# Patient Record
Sex: Female | Born: 1975 | Hispanic: No | Marital: Married | Smoking: Current every day smoker
Health system: Southern US, Community
[De-identification: ages and names within clinical notes are randomized; demographics above are authoritative.]

## PROBLEM LIST (undated history)

## (undated) DIAGNOSIS — R0602 Shortness of breath: Secondary | ICD-10-CM

## (undated) DIAGNOSIS — Z9221 Personal history of antineoplastic chemotherapy: Secondary | ICD-10-CM

## (undated) DIAGNOSIS — I10 Essential (primary) hypertension: Secondary | ICD-10-CM

## (undated) DIAGNOSIS — E1165 Type 2 diabetes mellitus with hyperglycemia: Secondary | ICD-10-CM

## (undated) DIAGNOSIS — R1900 Intra-abdominal and pelvic swelling, mass and lump, unspecified site: Secondary | ICD-10-CM

## (undated) DIAGNOSIS — J189 Pneumonia, unspecified organism: Secondary | ICD-10-CM

## (undated) DIAGNOSIS — G4730 Sleep apnea, unspecified: Secondary | ICD-10-CM

## (undated) DIAGNOSIS — F32A Depression, unspecified: Secondary | ICD-10-CM

## (undated) DIAGNOSIS — C50211 Malignant neoplasm of upper-inner quadrant of right female breast: Secondary | ICD-10-CM

## (undated) DIAGNOSIS — B977 Papillomavirus as the cause of diseases classified elsewhere: Secondary | ICD-10-CM

## (undated) DIAGNOSIS — F419 Anxiety disorder, unspecified: Secondary | ICD-10-CM

## (undated) DIAGNOSIS — C801 Malignant (primary) neoplasm, unspecified: Secondary | ICD-10-CM

## (undated) DIAGNOSIS — D649 Anemia, unspecified: Secondary | ICD-10-CM

## (undated) DIAGNOSIS — M549 Dorsalgia, unspecified: Secondary | ICD-10-CM

## (undated) DIAGNOSIS — Z171 Estrogen receptor negative status [ER-]: Secondary | ICD-10-CM

## (undated) DIAGNOSIS — M5430 Sciatica, unspecified side: Secondary | ICD-10-CM

## (undated) DIAGNOSIS — J45909 Unspecified asthma, uncomplicated: Secondary | ICD-10-CM

## (undated) DIAGNOSIS — Z8279 Family history of other congenital malformations, deformations and chromosomal abnormalities: Secondary | ICD-10-CM

## (undated) DIAGNOSIS — S060XAA Concussion with loss of consciousness status unknown, initial encounter: Secondary | ICD-10-CM

## (undated) DIAGNOSIS — F4310 Post-traumatic stress disorder, unspecified: Secondary | ICD-10-CM

## (undated) DIAGNOSIS — Z923 Personal history of irradiation: Secondary | ICD-10-CM

## (undated) DIAGNOSIS — Z803 Family history of malignant neoplasm of breast: Secondary | ICD-10-CM

## (undated) DIAGNOSIS — R87619 Unspecified abnormal cytological findings in specimens from cervix uteri: Secondary | ICD-10-CM

## (undated) DIAGNOSIS — S060X9A Concussion with loss of consciousness of unspecified duration, initial encounter: Secondary | ICD-10-CM

## (undated) DIAGNOSIS — R3121 Asymptomatic microscopic hematuria: Secondary | ICD-10-CM

## (undated) DIAGNOSIS — M7989 Other specified soft tissue disorders: Secondary | ICD-10-CM

## (undated) DIAGNOSIS — G709 Myoneural disorder, unspecified: Secondary | ICD-10-CM

## (undated) DIAGNOSIS — E785 Hyperlipidemia, unspecified: Secondary | ICD-10-CM

## (undated) DIAGNOSIS — E559 Vitamin D deficiency, unspecified: Secondary | ICD-10-CM

## (undated) DIAGNOSIS — IMO0002 Reserved for concepts with insufficient information to code with codable children: Secondary | ICD-10-CM

## (undated) DIAGNOSIS — Z8041 Family history of malignant neoplasm of ovary: Secondary | ICD-10-CM

## (undated) DIAGNOSIS — I82409 Acute embolism and thrombosis of unspecified deep veins of unspecified lower extremity: Secondary | ICD-10-CM

## (undated) DIAGNOSIS — F909 Attention-deficit hyperactivity disorder, unspecified type: Secondary | ICD-10-CM

## (undated) HISTORY — DX: Sleep apnea, unspecified: G47.30

## (undated) HISTORY — DX: Malignant neoplasm of upper-inner quadrant of right female breast: C50.211

## (undated) HISTORY — DX: Family history of malignant neoplasm of ovary: Z80.41

## (undated) HISTORY — DX: Attention-deficit hyperactivity disorder, unspecified type: F90.9

## (undated) HISTORY — DX: Estrogen receptor negative status (ER-): Z17.1

## (undated) HISTORY — DX: Reserved for concepts with insufficient information to code with codable children: IMO0002

## (undated) HISTORY — DX: Dorsalgia, unspecified: M54.9

## (undated) HISTORY — DX: Malignant (primary) neoplasm, unspecified: C80.1

## (undated) HISTORY — DX: Depression, unspecified: F32.A

## (undated) HISTORY — PX: COLPOSCOPY: SHX161

## (undated) HISTORY — DX: Sciatica, unspecified side: M54.30

## (undated) HISTORY — DX: Vitamin D deficiency, unspecified: E55.9

## (undated) HISTORY — DX: Other specified soft tissue disorders: M79.89

## (undated) HISTORY — DX: Family history of other congenital malformations, deformations and chromosomal abnormalities: Z82.79

## (undated) HISTORY — DX: Type 2 diabetes mellitus with hyperglycemia: E11.65

## (undated) HISTORY — DX: Intra-abdominal and pelvic swelling, mass and lump, unspecified site: R19.00

## (undated) HISTORY — DX: Asymptomatic microscopic hematuria: R31.21

## (undated) HISTORY — DX: Papillomavirus as the cause of diseases classified elsewhere: B97.7

## (undated) HISTORY — DX: Anxiety disorder, unspecified: F41.9

## (undated) HISTORY — DX: Unspecified abnormal cytological findings in specimens from cervix uteri: R87.619

## (undated) HISTORY — DX: Family history of malignant neoplasm of breast: Z80.3

## (undated) HISTORY — DX: Shortness of breath: R06.02

## (undated) HISTORY — DX: Anemia, unspecified: D64.9

---
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
## 2013-05-09 ENCOUNTER — Emergency Department (HOSPITAL_COMMUNITY): Payer: Self-pay

## 2013-05-09 ENCOUNTER — Encounter (HOSPITAL_COMMUNITY): Payer: Self-pay | Admitting: Emergency Medicine

## 2013-05-09 ENCOUNTER — Emergency Department (HOSPITAL_COMMUNITY)
Admission: EM | Admit: 2013-05-09 | Discharge: 2013-05-09 | Disposition: A | Discharge status: Home / Self Care | Payer: Self-pay | Attending: Emergency Medicine | Admitting: Emergency Medicine

## 2013-05-09 DIAGNOSIS — Z880 Allergy status to penicillin: Secondary | ICD-10-CM | POA: Insufficient documentation

## 2013-05-09 DIAGNOSIS — J45901 Unspecified asthma with (acute) exacerbation: Secondary | ICD-10-CM | POA: Insufficient documentation

## 2013-05-09 DIAGNOSIS — Z79899 Other long term (current) drug therapy: Secondary | ICD-10-CM | POA: Insufficient documentation

## 2013-05-09 DIAGNOSIS — J159 Unspecified bacterial pneumonia: Secondary | ICD-10-CM | POA: Insufficient documentation

## 2013-05-09 DIAGNOSIS — Z3202 Encounter for pregnancy test, result negative: Secondary | ICD-10-CM | POA: Insufficient documentation

## 2013-05-09 DIAGNOSIS — Z87891 Personal history of nicotine dependence: Secondary | ICD-10-CM | POA: Insufficient documentation

## 2013-05-09 DIAGNOSIS — J189 Pneumonia, unspecified organism: Secondary | ICD-10-CM

## 2013-05-09 HISTORY — DX: Unspecified asthma, uncomplicated: J45.909

## 2013-05-09 LAB — POCT PREGNANCY, URINE: Preg Test, Ur: NEGATIVE

## 2013-05-09 MED ORDER — ALBUTEROL SULFATE (5 MG/ML) 0.5% IN NEBU
2.5000 mg | INHALATION_SOLUTION | Freq: Once | RESPIRATORY_TRACT | Status: AC
  Administered 2013-05-09: 2.5 mg via RESPIRATORY_TRACT
  Filled 2013-05-09: qty 0.5

## 2013-05-09 MED ORDER — METHYLPREDNISOLONE SODIUM SUCC 125 MG IJ SOLR
125.0000 mg | Freq: Once | INTRAMUSCULAR | Status: AC
  Administered 2013-05-09: 125 mg via INTRAVENOUS
  Filled 2013-05-09: qty 2

## 2013-05-09 MED ORDER — PREDNISONE 20 MG PO TABS: 60.0000 mg | ORAL_TABLET | Freq: Every day | ORAL | Status: DC

## 2013-05-09 MED ORDER — IPRATROPIUM BROMIDE 0.02 % IN SOLN
0.5000 mg | Freq: Once | RESPIRATORY_TRACT | Status: AC
  Administered 2013-05-09: 0.5 mg via RESPIRATORY_TRACT
  Filled 2013-05-09: qty 2.5

## 2013-05-09 MED ORDER — AZITHROMYCIN 250 MG PO TABS: 250.0000 mg | ORAL_TABLET | Freq: Every day | ORAL | Status: DC

## 2013-05-09 MED ORDER — DEXTROSE 5 % IV SOLN
1.0000 g | Freq: Once | INTRAVENOUS | Status: AC
  Administered 2013-05-09: 1 g via INTRAVENOUS
  Filled 2013-05-09: qty 10

## 2013-05-09 MED ORDER — ALBUTEROL SULFATE HFA 108 (90 BASE) MCG/ACT IN AERS
2.0000 | INHALATION_SPRAY | RESPIRATORY_TRACT | Status: DC | PRN
  Administered 2013-05-09: 2 via RESPIRATORY_TRACT
  Filled 2013-05-09: qty 6.7

## 2013-05-09 NOTE — ED Notes (Signed)
Pt is here with coughing, wheezing, and tightness in chest.  Pt states that she feels like she has a weight in her chest.

## 2013-05-09 NOTE — ED Provider Notes (Signed)
CSN: [id number]     Arrival date & time 05/09/13  1555 History   First MD Initiated Contact with Patient 05/09/13 2114     Chief Complaint  Patient presents with  . Shortness of Breath  . Wheezing  . Cough   (Consider location/radiation/quality/duration/timing/severity/associated sxs/prior Treatment) Patient is a 37 y.o. female presenting with shortness of breath, wheezing, and cough. The history is provided by the patient.  Shortness of Breath Associated symptoms: cough and wheezing   Wheezing Associated symptoms: cough and shortness of breath   Cough Associated symptoms: shortness of breath and wheezing   She started with what she thought was a cold about one week ago. She had a sore throat and runny nose. Over the last 4 days, she has had a cough which is nonproductive, wheezing, and a tight feeling in her chest. She's not had fever or chills or sweats. There's been no nausea vomiting or diarrhea. She denies arthralgias or myalgias. She does have a rescue inhaler which is used which does give some slight relief. Cough is worse at night it tends to wake her up. She is a cigarette smoker although she has not smoked since he started coughing. She has a history of asthma in the past but it has not given her problems for 3 years.  Past Medical History  Diagnosis Date  . Asthma    History reviewed. No pertinent past surgical history. No family history on file. History  Substance Use Topics  . Smoking status: Former Games developer  . Smokeless tobacco: Not on file  . Alcohol Use: No   OB History   Grav Para Term Preterm Abortions TAB SAB Ect Mult Living                 Review of Systems  Respiratory: Positive for cough, shortness of breath and wheezing.   All other systems reviewed and are negative.    Allergies  Penicillins  Home Medications   Current Outpatient Rx  Name  Route  Sig  Dispense  Refill  . albuterol (PROVENTIL HFA;VENTOLIN HFA) 108 (90 BASE) MCG/ACT inhaler  Inhalation   Inhale 2 puffs into the lungs every 6 (six) hours as needed for wheezing.         Marland Kitchen aspirin-acetaminophen-caffeine (EXCEDRIN MIGRAINE) 250-250-65 MG per tablet   Oral   Take 2 tablets by mouth every 6 (six) hours as needed for pain.         Marland Kitchen aspirin-sod bicarb-citric acid (ALKA-SELTZER) 325 MG TBEF tablet   Oral   Take 325 mg by mouth every 6 (six) hours as needed (for congestion).          BP 148/98  Pulse 102  Temp(Src) 98.8 F (37.1 C) (Oral)  Resp 22  Wt 363 lb 9 oz (164.911 kg)  SpO2 98%  LMP 04/18/2013 Physical Exam  Nursing note and vitals reviewed.  37 year old female, resting comfortably and in no acute distress. Vital signs are significant for mild tachycardia with heart rate 102, tachypnea with respiratory rate of 22, and hypertension with blood pressure 140/98. Oxygen saturation is 98%, which is normal. Head is normocephalic and atraumatic. PERRLA, EOMI. Oropharynx is clear. Neck is nontender and supple without adenopathy or JVD. Back is nontender and there is no CVA tenderness. Lungs are have a slightly prolonged exhalation phase without overt rales, wheezes, rhonchi. When she coughs, wheezes are noted. Chest is nontender. Heart has regular rate and rhythm without murmur. Abdomen is soft, flat, nontender without  masses or hepatosplenomegaly and peristalsis is normoactive. Extremities have no cyanosis or edema, full range of motion is present. Skin is warm and dry without rash. Neurologic: Mental status is normal, cranial nerves are intact, there are no motor or sensory deficits.  ED Course  Procedures (including critical care time) Labs Review Results for orders placed during the hospital encounter of 05/09/13  POCT PREGNANCY, URINE      Result Value Range   Preg Test, Ur NEGATIVE  NEGATIVE   Imaging Review Dg Chest 2 View (if Patient Has Fever And/or Copd)  05/09/2013   CLINICAL DATA:  Fever, shortness of breath, wheezing, asthma, smoker   EXAM: CHEST  2 VIEW  COMPARISON:  None. Marland Kitchen  FINDINGS: Patchy consolidative airspace disease in the left midlung obscures the left cardiac border compatible with lingula pneumonia. Minor right base atelectasis. No superimposed edema, effusion, or pneumothorax. Trachea is midline. Slight rotation to the right. Normal heart size and vascularity. No osseous abnormality.  IMPRESSION: Lingula airspace process compatible with pneumonia. Recommend radiographic followup to document complete resolution.   Electronically Signed   By: Ruel Favors M.D.   On: 05/09/2013 17:10    MDM   1. Community acquired pneumonia    Respiratory tract infection with cough and wheezing. Chest x-ray shows evidence of lingular pneumonia. She is started on antibiotics and is given a dose of ceftriaxone. She is also given a dose of methylprednisolone and will be given albuterol with ipratropium via nebulizer. She is penicillin allergic, so I anticipate sending her home on azithromycin, prednisone, and an albuterol inhaler.  She feels much better after above noted treatment. She is discharged as noted above.  Dione Booze, MD 05/09/13 [phone]

## 2016-07-14 DIAGNOSIS — Z9221 Personal history of antineoplastic chemotherapy: Secondary | ICD-10-CM

## 2016-07-14 DIAGNOSIS — Z923 Personal history of irradiation: Secondary | ICD-10-CM

## 2016-07-14 HISTORY — DX: Personal history of antineoplastic chemotherapy: Z92.21

## 2016-07-14 HISTORY — DX: Personal history of irradiation: Z92.3

## 2016-09-15 ENCOUNTER — Encounter: Payer: Self-pay | Admitting: Family Medicine

## 2016-09-15 ENCOUNTER — Ambulatory Visit (INDEPENDENT_AMBULATORY_CARE_PROVIDER_SITE_OTHER): Payer: Managed Care, Other (non HMO) | Admitting: Family Medicine

## 2016-09-15 VITALS — BP 124/88 | HR 82 | Ht 73.5 in | Wt 366.2 lb

## 2016-09-15 DIAGNOSIS — Z0000 Encounter for general adult medical examination without abnormal findings: Secondary | ICD-10-CM

## 2016-09-15 DIAGNOSIS — Z23 Encounter for immunization: Secondary | ICD-10-CM

## 2016-09-15 DIAGNOSIS — J4520 Mild intermittent asthma, uncomplicated: Secondary | ICD-10-CM | POA: Diagnosis not present

## 2016-09-15 DIAGNOSIS — A599 Trichomoniasis, unspecified: Secondary | ICD-10-CM | POA: Diagnosis not present

## 2016-09-15 DIAGNOSIS — Z720 Tobacco use: Secondary | ICD-10-CM

## 2016-09-15 DIAGNOSIS — R81 Glycosuria: Secondary | ICD-10-CM | POA: Diagnosis not present

## 2016-09-15 DIAGNOSIS — Z7689 Persons encountering health services in other specified circumstances: Secondary | ICD-10-CM | POA: Diagnosis not present

## 2016-09-15 DIAGNOSIS — F17210 Nicotine dependence, cigarettes, uncomplicated: Secondary | ICD-10-CM | POA: Insufficient documentation

## 2016-09-15 DIAGNOSIS — Z1231 Encounter for screening mammogram for malignant neoplasm of breast: Secondary | ICD-10-CM

## 2016-09-15 DIAGNOSIS — E119 Type 2 diabetes mellitus without complications: Secondary | ICD-10-CM | POA: Diagnosis not present

## 2016-09-15 DIAGNOSIS — Z1239 Encounter for other screening for malignant neoplasm of breast: Secondary | ICD-10-CM

## 2016-09-15 LAB — CBC WITH DIFFERENTIAL/PLATELET
Basophils Absolute: 0 cells/uL (ref 0–200)
Basophils Relative: 0 %
Eosinophils Absolute: 91 cells/uL (ref 15–500)
Eosinophils Relative: 1 %
HCT: 41.2 % (ref 35.0–45.0)
Hemoglobin: 13.6 g/dL (ref 11.7–15.5)
Lymphocytes Relative: 24 %
Lymphs Abs: 2184 cells/uL (ref 850–3900)
MCH: 25.1 pg — ABNORMAL LOW (ref 27.0–33.0)
MCHC: 33 g/dL (ref 32.0–36.0)
MCV: 76 fL — ABNORMAL LOW (ref 80.0–100.0)
MPV: 9.8 fL (ref 7.5–12.5)
Monocytes Absolute: 273 cells/uL (ref 200–950)
Monocytes Relative: 3 %
Neutro Abs: 6552 cells/uL (ref 1500–7800)
Neutrophils Relative %: 72 %
Platelets: 293 10*3/uL (ref 140–400)
RBC: 5.42 MIL/uL — ABNORMAL HIGH (ref 3.80–5.10)
RDW: 15.1 % — ABNORMAL HIGH (ref 11.0–15.0)
WBC: 9.1 10*3/uL (ref 4.0–10.5)

## 2016-09-15 LAB — POCT URINALYSIS DIPSTICK
Bilirubin, UA: NEGATIVE
Ketones, UA: NEGATIVE
Leukocytes, UA: NEGATIVE
Nitrite, UA: NEGATIVE
Spec Grav, UA: >=1.03
Urobilinogen, UA: NEGATIVE
pH, UA: 6

## 2016-09-15 LAB — LIPID PANEL
Cholesterol: 111 mg/dL (ref <200)
HDL: 27 mg/dL — ABNORMAL LOW (ref >50)
LDL Cholesterol: 68 mg/dL (ref <100)
Total CHOL/HDL Ratio: 4.1 Ratio (ref <5.0)
Triglycerides: 79 mg/dL (ref <150)
VLDL: 16 mg/dL (ref <30)

## 2016-09-15 LAB — COMPREHENSIVE METABOLIC PANEL
ALT: 16 U/L (ref 6–29)
AST: 14 U/L (ref 10–30)
Albumin: 3.8 g/dL (ref 3.6–5.1)
Alkaline Phosphatase: 99 U/L (ref 33–115)
BUN: 13 mg/dL (ref 7–25)
CO2: 22 mmol/L (ref 20–31)
Calcium: 9.3 mg/dL (ref 8.6–10.2)
Chloride: 106 mmol/L (ref 98–110)
Creat: 0.61 mg/dL (ref 0.50–1.10)
Glucose, Bld: 215 mg/dL — ABNORMAL HIGH (ref 65–99)
Potassium: 4.4 mmol/L (ref 3.5–5.3)
Sodium: 138 mmol/L (ref 135–146)
Total Bilirubin: 0.4 mg/dL (ref 0.2–1.2)
Total Protein: 6.5 g/dL (ref 6.1–8.1)

## 2016-09-15 LAB — TSH: TSH: 2.01 mIU/L

## 2016-09-15 LAB — POCT GLYCOSYLATED HEMOGLOBIN (HGB A1C): Hemoglobin A1C: 8.8

## 2016-09-15 LAB — GLUCOSE, POCT (MANUAL RESULT ENTRY): POC Glucose: 227 mg/dl — AB (ref 70–99)

## 2016-09-15 MED ORDER — ONETOUCH DELICA LANCETS FINE MISC: 5 refills | Status: DC

## 2016-09-15 MED ORDER — BLOOD GLUCOSE TEST VI STRP: ORAL_STRIP | 5 refills | Status: DC

## 2016-09-15 MED ORDER — METFORMIN HCL 500 MG PO TABS: 500.0000 mg | ORAL_TABLET | Freq: Two times a day (BID) | ORAL | 1 refills | Status: DC

## 2016-09-15 MED ORDER — METRONIDAZOLE 500 MG PO TABS: 2000.0000 mg | ORAL_TABLET | Freq: Once | ORAL | 0 refills | Status: AC

## 2016-09-15 NOTE — Patient Instructions (Signed)
Start checking your blood sugars twice daily once in the morning fasting and 2 hours after a meal.  Start the Metformin twice daily with meals.  Keep a log and bring in to your visit in 2 weeks.  The nutritionist will call you to schedule an appointment.   Call and schedule your mammogram.   Do not drink alcohol with the antibiotic.    Preventive Care for Adults - Female      MAINTAIN REGULAR HEALTH EXAMS:  A routine yearly physical is a good way to check in with your primary care provider about your health and preventive screening. It is also an opportunity to share updates about your health and any concerns you have, and receive a thorough all-over exam.   Most health insurance companies pay for at least some preventative services.  Check with your health plan for specific coverages.  WHAT PREVENTATIVE SERVICES DO WOMEN NEED?  Adult women should have their weight and blood pressure checked regularly.   Women age 85 and older should have their cholesterol levels checked regularly.  Women should be screened for cervical cancer with a Pap smear and pelvic exam beginning at either age 39, or 3 years after they become sexually activity.    Breast cancer screening generally begins at age 46 with a mammogram and breast exam by your primary care provider.    Beginning at age 12 and continuing to age 31, women should be screened for colorectal cancer.  Certain people may need continued testing until age 68.  Updating vaccinations is part of preventative care.  Vaccinations help protect against diseases such as the flu.  Osteoporosis is a disease in which the bones lose minerals and strength as we age. Women ages 12 and over should discuss this with their caregivers, as should women after menopause who have other risk factors.  Lab tests are generally done as part of preventative care to screen for anemia and blood disorders, to screen for problems with the kidneys and liver, to screen for  bladder problems, to check blood sugar, and to check your cholesterol level.  Preventative services generally include counseling about diet, exercise, avoiding tobacco, drugs, excessive alcohol consumption, and sexually transmitted infections.    GENERAL RECOMMENDATIONS FOR GOOD HEALTH:  Healthy diet:  Eat a variety of foods, including fruit, vegetables, animal or vegetable protein, such as meat, fish, chicken, and eggs, or beans, lentils, tofu, and grains, such as rice.  Drink plenty of water daily.  Decrease saturated fat in the diet, avoid lots of red meat, processed foods, sweets, fast foods, and fried foods.  Exercise:  Aerobic exercise helps maintain good heart health. At least 30-40 minutes of moderate-intensity exercise is recommended. For example, a brisk walk that increases your heart rate and breathing. This should be done on most days of the week.   Find a type of exercise or a variety of exercises that you enjoy so that it becomes a part of your daily life.  Examples are running, walking, swimming, water aerobics, and biking.  For motivation and support, explore group exercise such as aerobic class, spin class, Zumba, Yoga,or  martial arts, etc.    Set exercise goals for yourself, such as a certain weight goal, walk or run in a race such as a 5k walk/run.  Speak to your primary care provider about exercise goals.  Disease prevention:  If you smoke or chew tobacco, find out from your caregiver how to quit. It can literally save your life,  no matter how long you have been a tobacco user. If you do not use tobacco, never begin.   Maintain a healthy diet and normal weight. Increased weight leads to problems with blood pressure and diabetes.   The Body Mass Index or BMI is a way of measuring how much of your body is fat. Having a BMI above 27 increases the risk of heart disease, diabetes, hypertension, stroke and other problems related to obesity. Your caregiver can help determine  your BMI and based on it develop an exercise and dietary program to help you achieve or maintain this important measurement at a healthful level.  High blood pressure causes heart and blood vessel problems.  Persistent high blood pressure should be treated with medicine if weight loss and exercise do not work.   Fat and cholesterol leaves deposits in your arteries that can block them. This causes heart disease and vessel disease elsewhere in your body.  If your cholesterol is found to be high, or if you have heart disease or certain other medical conditions, then you may need to have your cholesterol monitored frequently and be treated with medication.   Ask if you should have a cardiac stress test if your history suggests this. A stress test is a test done on a treadmill that looks for heart disease. This test can find disease prior to there being a problem.  Menopause can be associated with physical symptoms and risks. Hormone replacement therapy is available to decrease these. You should talk to your caregiver about whether starting or continuing to take hormones is right for you.   Osteoporosis is a disease in which the bones lose minerals and strength as we age. This can result in serious bone fractures. Risk of osteoporosis can be identified using a bone density scan. Women ages 30 and over should discuss this with their caregivers, as should women after menopause who have other risk factors. Ask your caregiver whether you should be taking a calcium supplement and Vitamin D, to reduce the rate of osteoporosis.   Avoid drinking alcohol in excess (more than two drinks per day).  Avoid use of street drugs. Do not share needles with anyone. Ask for professional help if you need assistance or instructions on stopping the use of alcohol, cigarettes, and/or drugs.  Brush your teeth twice a day with fluoride toothpaste, and floss once a day. Good oral hygiene prevents tooth decay and gum disease. The  problems can be painful, unattractive, and can cause other health problems. Visit your dentist for a routine oral and dental check up and preventive care every 6-12 months.   Look at your skin regularly.  Use a mirror to look at your back. Notify your caregivers of changes in moles, especially if there are changes in shapes, colors, a size larger than a pencil eraser, an irregular border, or development of new moles.  Safety:  Use seatbelts 100% of the time, whether driving or as a passenger.  Use safety devices such as hearing protection if you work in environments with loud noise or significant background noise.  Use safety glasses when doing any work that could send debris in to the eyes.  Use a helmet if you ride a bike or motorcycle.  Use appropriate safety gear for contact sports.  Talk to your caregiver about gun safety.  Use sunscreen with a SPF (or skin protection factor) of 15 or greater.  Lighter skinned people are at a greater risk of skin cancer. Don't forget  to also wear sunglasses in order to protect your eyes from too much damaging sunlight. Damaging sunlight can accelerate cataract formation.   Practice safe sex. Use condoms. Condoms are used for birth control and to help reduce the spread of sexually transmitted infections (or STIs).  Some of the STIs are gonorrhea (the clap), chlamydia, syphilis, trichomonas, herpes, HPV (human papilloma virus) and HIV (human immunodeficiency virus) which causes AIDS. The herpes, HIV and HPV are viral illnesses that have no cure. These can result in disability, cancer and death.   Keep carbon monoxide and smoke detectors in your home functioning at all times. Change the batteries every 6 months or use a model that plugs into the wall.   Vaccinations:  Stay up to date with your tetanus shots and other required immunizations. You should have a booster for tetanus every 10 years. Be sure to get your flu shot every year, since 5%-20% of the [place]  population comes down with the flu. The flu vaccine changes each year, so being vaccinated once is not enough. Get your shot in the fall, before the flu season peaks.   Other vaccines to consider:  Human Papilloma Virus or HPV causes cancer of the cervix, and other infections that can be transmitted from person to person. There is a vaccine for HPV, and females should get immunized between the ages of 38 and 65. It requires a series of 3 shots.   Pneumococcal vaccine to protect against certain types of pneumonia.  This is normally recommended for adults age 73 or older.  However, adults younger than 41 years old with certain underlying conditions such as diabetes, heart or lung disease should also receive the vaccine.  Shingles vaccine to protect against Varicella Zoster if you are older than age 27, or younger than 41 years old with certain underlying illness.  Hepatitis A vaccine to protect against a form of infection of the liver by a virus acquired from food.  Hepatitis B vaccine to protect against a form of infection of the liver by a virus acquired from blood or body fluids, particularly if you work in health care.  If you plan to travel internationally, check with your local health department for specific vaccination recommendations.  Cancer Screening:  Breast cancer screening is essential to preventive care for women. All women age 61 and older should perform a breast self-exam every month. At age 42 and older, women should have their caregiver complete a breast exam each year. Women at ages 5 and older should have a mammogram (x-ray film) of the breasts. Your caregiver can discuss how often you need mammograms.    Cervical cancer screening includes taking a Pap smear (sample of cells examined under a microscope) from the cervix (end of the uterus). It also includes testing for HPV (Human Papilloma Virus, which can cause cervical cancer). Screening and a pelvic exam should begin at age  68, or 3 years after a woman becomes sexually active. Screening should occur every year, with a Pap smear but no HPV testing, up to age 47. After age 21, you should have a Pap smear every 3 years with HPV testing, if no HPV was found previously.   Most routine colon cancer screening begins at the age of 93. On a yearly basis, doctors may provide special easy to use take-home tests to check for hidden blood in the stool. Sigmoidoscopy or colonoscopy can detect the earliest forms of colon cancer and is life saving. These tests use a  Curley camera at the end of a tube to directly examine the colon. Speak to your caregiver about this at age 58, when routine screening begins (and is repeated every 5 years unless early forms of pre-cancerous polyps or Iyengar growths are found).

## 2016-09-15 NOTE — Progress Notes (Addendum)
Subjective:    Patient ID: Kelly Mendez, female    DOB: 01-Dec-1975, 41 y.o.   MRN: [id number]  HPI Chief Complaint  Patient presents with  . new pt    new pt cpe, had pap 2 weeks ago. concerns with weight and quit smoking   She is new to the practice and here for a complete physical exam. [facility]  Last CPE: years ago in [place]   States she had a pap smear at [facility] 09/05/2016 and was positive for HPV but no abnormal cells detected. She is to have a repeat pap in 12 months (February 2019). She was also positive for Trichomonas and has not been treated. She is sexually active with her husband and one other partner.   Has been having increased thirst and urinary frequency. Denies history of diabetes.   Smokes 3 cigarettes daily and would like help stopping.    Other providers: None  Past medical history: asthma and has an albuterol inhaler. Normally only needs it once per month. Exercise triggers her asthma.  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   Family history: diabetes- father, paternal grandmother , breast cancer - mother in her late 58s. Lumpectomy and cancer free. No colon cancer.   Social history: Lives with husband and daughter who is 75 and 2 step sons , works for [employer] at [employer], call center   Smokes 3-4 cigarettes daily, drinks alcohol- 5-6 drinks per week, denies drug use  Diet: nothing particular  Excerise: past 2 months nothing but exercising 2 times per week prior.   Immunizations: Tdap- overdue.   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  Pregnancies: 1 living child, premie. 2 other abortions.  Contraception: depo injections (has had to take progestin only pill in addition to stop bleeding)  Last Dental Exam: once annually  Last Eye Exam: [facility] 07/2016 states this was a dilated exam    Wears seatbelt always, smoke detectors in home and functioning, does not text while driving and feels safe in home environment.   Reviewed allergies, medications, past medical, surgical, family, and social history.   Review of Systems Review of Systems Constitutional: -fever, -chills, -sweats, -unexpected weight change,-fatigue ENT: -runny nose, -ear pain, -sore throat Cardiology:  -chest pain, -palpitations, -edema Respiratory: -cough, -shortness of breath, -wheezing Gastroenterology: -abdominal pain, -nausea, -vomiting, -diarrhea, -constipation  Hematology: -bleeding or bruising problems Musculoskeletal: -arthralgias, -myalgias, -joint swelling, -back pain Ophthalmology: -vision changes Urology: -dysuria, -difficulty urinating, -hematuria, +urinary frequency, -urgency Neurology: -headache, -weakness, -tingling, -numbness       Objective:   Physical Exam BP 124/88   Pulse 82   Ht 6' 1.5" (1.867 m)   Wt (!) 366 lb 3.2 oz (166.1 kg)   LMP 08/31/2016   BMI 47.66 kg/m   General Appearance:    Alert, cooperative, no distress, appears stated age  Head:    Normocephalic, without obvious abnormality, atraumatic  Eyes:    PERRL, conjunctiva/corneas clear, EOM's intact, fundi    benign  Ears:    Normal TM's and external ear canals  Nose:   Nares normal, mucosa normal, no drainage or sinus   tenderness  Throat:   Lips, mucosa, and tongue normal; teeth and gums normal  Neck:   Supple, no lymphadenopathy;  thyroid:  no   enlargement/tenderness/nodules; no carotid   bruit or JVD  Back:    Spine  nontender, no curvature, ROM normal, no CVA     tenderness  Lungs:     Clear to auscultation bilaterally without wheezes, rales or     ronchi; respirations unlabored  Chest Wall:    No tenderness or deformity   Heart:    Regular rate and rhythm, S1 and S2 normal, no murmur, rub   or gallop  Breast Exam:    Mammogram ordered.  No axillary lymphadenopathy  Abdomen:     Soft, non-tender,  nondistended, normoactive bowel sounds,    no masses, no hepatosplenomegaly  Genitalia:    Pelvic and pap performed 2 weeks ago at [facility].      Extremities:   No clubbing, cyanosis or edema  Pulses:   2+ and symmetric all extremities  Skin:   Skin color, texture, turgor normal, no rashes or lesions  Lymph nodes:   Cervical, supraclavicular, and axillary nodes normal  Neurologic:   CNII-XII intact, normal strength, sensation and gait; reflexes 2+ and symmetric throughout          Psych:   Normal mood, affect, hygiene and grooming.    Urinalysis dipstick: glucose 3+, neg ketones, spec grav >=1.030     Assessment & Plan:  Routine general medical examination at a health care facility - Plan: CBC with Differential/Platelet, Comprehensive metabolic panel, POCT urinalysis dipstick  Morbid obesity (HCC) - Plan: Amb ref to Medical Nutrition Therapy-MNT  Encounter to establish care  Glucose found in urine on examination - Plan: POCT glucose (manual entry), POCT glycosylated hemoglobin (Hb A1C)  New onset type 2 diabetes mellitus ([place]) - Plan: CBC with Differential/Platelet, Comprehensive metabolic panel, POCT glucose (manual entry), POCT glycosylated hemoglobin (Hb A1C), Lipid panel, TSH, Amb ref to Medical Nutrition Therapy-MNT, metFORMIN (GLUCOPHAGE) 500 MG tablet, Microalbumin/Creatinine Ratio, Urine  Trichomoniasis - Plan: metroNIDAZOLE (FLAGYL) 500 MG tablet, HIV antibody, RPR, CANCELED: RPR, CANCELED: HIV antibody  Screening for breast cancer - Plan: MM DIGITAL SCREENING BILATERAL  Need for Tdap vaccination - Plan: Tdap vaccine greater than or equal to 7yo IM  Light cigarette smoker  Mild intermittent asthma without complication  Glucose in urine.  POCT fasting glucose 227.  Hemoglobin 8.8%  Discussed diabetes spectrum and management of condition.  Start taking Metformin twice daily. Checking blood sugars twice daily. She will return in 2 weeks with her blood sugar  readings.  Recent eye exam and will attempt to get report.  Referral to MNT.  Will order microalbumin.  Discussed risk factors for heart disease such as obesity, diabetes, inactivity, smoking [facility] and she was positive for trich and will treat her with metronidazole. Advised to avoid alcohol. Counseled on safe sex.  Discussed stopping smoking. Counseled on finding healthy alternative to her cigarettes.  Asthma is well controlled.  Tdap given.  Mammogram ordered and she will call to schedule.  2 week follow up and will see how her BS looks, repeat urinalysis and rule out trich. will need to start her on low dose ace inhibitor for renal protection. Contraception is depo-provera. Consider statin pending labs.

## 2016-09-16 ENCOUNTER — Encounter: Payer: Self-pay | Admitting: Family Medicine

## 2016-09-16 ENCOUNTER — Other Ambulatory Visit: Payer: Self-pay | Admitting: Family Medicine

## 2016-09-16 DIAGNOSIS — E786 Lipoprotein deficiency: Secondary | ICD-10-CM | POA: Insufficient documentation

## 2016-09-16 DIAGNOSIS — I2510 Atherosclerotic heart disease of native coronary artery without angina pectoris: Secondary | ICD-10-CM | POA: Insufficient documentation

## 2016-09-16 LAB — MICROALBUMIN / CREATININE URINE RATIO
Creatinine, Urine: 233 mg/dL (ref 20–320)
Microalb Creat Ratio: 19 mcg/mg creat (ref <30)
Microalb, Ur: 4.5 mg/dL

## 2016-09-16 LAB — RPR

## 2016-09-16 LAB — HIV ANTIBODY (ROUTINE TESTING W REFLEX): HIV 1&2 Ab, 4th Generation: NONREACTIVE

## 2016-09-16 MED ORDER — ATORVASTATIN CALCIUM 20 MG PO TABS: 20.0000 mg | ORAL_TABLET | Freq: Every day | ORAL | 1 refills | Status: DC

## 2016-09-17 ENCOUNTER — Other Ambulatory Visit: Payer: Self-pay | Admitting: Family Medicine

## 2016-09-17 DIAGNOSIS — E786 Lipoprotein deficiency: Secondary | ICD-10-CM

## 2016-09-29 ENCOUNTER — Encounter: Payer: Self-pay | Admitting: Family Medicine

## 2016-09-29 ENCOUNTER — Ambulatory Visit (INDEPENDENT_AMBULATORY_CARE_PROVIDER_SITE_OTHER): Payer: Managed Care, Other (non HMO) | Admitting: Family Medicine

## 2016-09-29 VITALS — BP 126/80 | HR 81 | Wt 362.4 lb

## 2016-09-29 DIAGNOSIS — E119 Type 2 diabetes mellitus without complications: Secondary | ICD-10-CM

## 2016-09-29 DIAGNOSIS — N898 Other specified noninflammatory disorders of vagina: Secondary | ICD-10-CM | POA: Diagnosis not present

## 2016-09-29 DIAGNOSIS — A599 Trichomoniasis, unspecified: Secondary | ICD-10-CM | POA: Diagnosis not present

## 2016-09-29 DIAGNOSIS — L298 Other pruritus: Secondary | ICD-10-CM | POA: Diagnosis not present

## 2016-09-29 DIAGNOSIS — E786 Lipoprotein deficiency: Secondary | ICD-10-CM | POA: Diagnosis not present

## 2016-09-29 LAB — POCT URINALYSIS DIPSTICK
Bilirubin, UA: NEGATIVE
Ketones, UA: NEGATIVE
Leukocytes, UA: NEGATIVE
Nitrite, UA: NEGATIVE
Spec Grav, UA: 1.03 (ref 1.030–1.035)
Urobilinogen, UA: NEGATIVE (ref ?–2.0)
pH, UA: 6 (ref 5.0–8.0)

## 2016-09-29 LAB — POCT URINE PREGNANCY: Preg Test, Ur: NEGATIVE

## 2016-09-29 LAB — POCT WET PREP (WET MOUNT)
Clue Cells Wet Prep Whiff POC: NEGATIVE
KOH Wet Prep POC: POSITIVE
Trichomonas Wet Prep HPF POC: ABSENT

## 2016-09-29 LAB — LIPID PANEL
Cholesterol: 79 mg/dL (ref <200)
HDL: 26 mg/dL — ABNORMAL LOW (ref >50)
LDL Cholesterol: 40 mg/dL (ref <100)
Total CHOL/HDL Ratio: 3 Ratio (ref <5.0)
Triglycerides: 66 mg/dL (ref <150)
VLDL: 13 mg/dL (ref <30)

## 2016-09-29 MED ORDER — METFORMIN HCL 1000 MG PO TABS: 1000.0000 mg | ORAL_TABLET | Freq: Two times a day (BID) | ORAL | 2 refills | Status: DC

## 2016-09-29 MED ORDER — FLUCONAZOLE 150 MG PO TABS: 150.0000 mg | ORAL_TABLET | Freq: Once | ORAL | 0 refills | Status: AC

## 2016-09-29 NOTE — Patient Instructions (Addendum)
Goal range for fasting blood sugar is 80-130 and for 2 hours after a meal 130-160.  *If you are seeing readings consistently over 200 then call me.  Increase your Metformin to 1,000 mg twice daily with food. Continue checking your blood sugars twice daily.  Let me know what your readings are in 2 weeks.   Continue limiting your sugar and carbohydrate intake and increase physical activity.  Go to the medial nutritionist as scheduled.   We will call you with lab results.  Take the Diflucan and see if your symptoms improve. Call me if not improving.

## 2016-09-29 NOTE — Progress Notes (Signed)
   Subjective:    Patient ID: Kelly Mendez, female    DOB: 1976-04-08, 41 y.o.   MRN: [id number]  HPI Chief Complaint  Patient presents with  . follow-up    2 week follow-up. this morning around 7:30am 180. mornings running alittle higher than 2 hours after a meal   She is here for a 2 week follow up on new onset diabetes and repeat testing for trich testing after treatment. New concern today includes vaginal itching and spotting. States she does not have periods since having Depo provera for contraception. Spotting is new for her.  She is having some nausea and states she thinks it is related to the new statin she is taking. She denies nausea after taking Metformin.   Fasting blood sugars: 132-206 2 hours after meals readings 136-184  She has lost 4 lbs.  Stopped eating bread. Stopped putting sugar in her coffee but is using flavored creamer.  She is walking more.   Has appointment on April 9th with nutritionist  States she continues to be thirsty but urinary frequency has decreased.    Reviewed allergies, medications, past medical, and social history.    Review of Systems Pertinent positives and negatives in the history of present illness.     Objective:   Physical Exam BP 126/80   Pulse 81   Wt (!) 362 lb 6.4 oz (164.4 kg)   LMP 08/31/2016   BMI 47.16 kg/m   Alert and oriented and in no acute distress. GU exam: declined. She did a vaginal self swab.       Assessment & Plan:  New onset type 2 diabetes mellitus ([place]) - Plan: metFORMIN (GLUCOPHAGE) 1000 MG tablet, POCT urinalysis dipstick  Low HDL (under 40) - Plan: Lipid Panel  Vaginal itching - Plan: POCT Wet Prep ([place]), GC/Chlamydia Probe Amp, POCT urinalysis dipstick  Trichomoniasis - Plan: POCT Wet Prep ([place]), SureSwab, T.vaginalis RNA,Ql,Female  Vaginal discharge, bloody - Plan: POCT Wet Prep [employer] [place]), GC/Chlamydia Probe Amp, POCT urine pregnancy, SureSwab, T.vaginalis RNA,Ql,Female  New  onset diabetes type 2: Increase metformin to 1,000 mg twice daily and continue checking blood sugars twice daily.  MNT appointment April 9th.  Increase exercise.  Continue taking statin and let me know if she is not tolerating it.   Vaginal symptoms and recent trich diagnosis: [place]: +yeast, -trich, BV. Diflucan sent to pharmacy.  Negative pregnancy test.  Checking urine for trich as well.  Call me in 2 weeks with BS readings.  Follow up in 1 month for diabetes.  Consider referral to OB/GYN for abnormal pap smears in past.

## 2016-09-30 LAB — GC/CHLAMYDIA PROBE AMP
CT Probe RNA: NOT DETECTED
GC Probe RNA: NOT DETECTED

## 2016-09-30 LAB — SURESWAB, T.VAGINALIS RNA,QL,FEMALE: Trichomonas vaginalis RNA: NOT DETECTED

## 2016-10-06 ENCOUNTER — Ambulatory Visit
Admission: RE | Admit: 2016-10-06 | Discharge: 2016-10-06 | Disposition: A | Discharge status: Home / Self Care | Payer: Managed Care, Other (non HMO) | Source: Ambulatory Visit | Attending: Family Medicine | Admitting: Family Medicine

## 2016-10-06 DIAGNOSIS — Z1239 Encounter for other screening for malignant neoplasm of breast: Secondary | ICD-10-CM

## 2016-10-07 ENCOUNTER — Encounter: Payer: Self-pay | Admitting: Family Medicine

## 2016-10-08 ENCOUNTER — Other Ambulatory Visit: Payer: Self-pay | Admitting: Family Medicine

## 2016-10-08 DIAGNOSIS — R928 Other abnormal and inconclusive findings on diagnostic imaging of breast: Secondary | ICD-10-CM

## 2016-10-10 ENCOUNTER — Other Ambulatory Visit: Payer: Self-pay

## 2016-10-10 ENCOUNTER — Other Ambulatory Visit: Payer: Self-pay | Admitting: Family Medicine

## 2016-10-10 DIAGNOSIS — R928 Other abnormal and inconclusive findings on diagnostic imaging of breast: Secondary | ICD-10-CM

## 2016-10-13 ENCOUNTER — Other Ambulatory Visit: Payer: Self-pay | Admitting: Family Medicine

## 2016-10-13 ENCOUNTER — Ambulatory Visit
Admission: RE | Admit: 2016-10-13 | Discharge: 2016-10-13 | Disposition: A | Discharge status: Home / Self Care | Payer: Managed Care, Other (non HMO) | Source: Ambulatory Visit | Attending: Family Medicine | Admitting: Family Medicine

## 2016-10-13 DIAGNOSIS — N6310 Unspecified lump in the right breast, unspecified quadrant: Secondary | ICD-10-CM

## 2016-10-13 DIAGNOSIS — R928 Other abnormal and inconclusive findings on diagnostic imaging of breast: Secondary | ICD-10-CM

## 2016-10-15 ENCOUNTER — Other Ambulatory Visit: Payer: Self-pay | Admitting: Family Medicine

## 2016-10-15 ENCOUNTER — Other Ambulatory Visit: Payer: Managed Care, Other (non HMO)

## 2016-10-15 DIAGNOSIS — R599 Enlarged lymph nodes, unspecified: Secondary | ICD-10-CM

## 2016-10-16 ENCOUNTER — Encounter: Payer: Self-pay | Admitting: Family Medicine

## 2016-10-16 ENCOUNTER — Ambulatory Visit
Admission: RE | Admit: 2016-10-16 | Discharge: 2016-10-16 | Disposition: A | Discharge status: Home / Self Care | Payer: Managed Care, Other (non HMO) | Source: Ambulatory Visit | Attending: Family Medicine | Admitting: Family Medicine

## 2016-10-16 ENCOUNTER — Other Ambulatory Visit: Payer: Self-pay | Admitting: Family Medicine

## 2016-10-16 DIAGNOSIS — N6310 Unspecified lump in the right breast, unspecified quadrant: Secondary | ICD-10-CM

## 2016-10-16 DIAGNOSIS — R599 Enlarged lymph nodes, unspecified: Secondary | ICD-10-CM

## 2016-10-17 ENCOUNTER — Telehealth: Payer: Self-pay | Admitting: *Deleted

## 2016-10-17 NOTE — Telephone Encounter (Signed)
Confirmed [facility] for 10/22/16 at 1215 .  Instructions and contact information given.

## 2016-10-20 ENCOUNTER — Encounter: Payer: Self-pay | Admitting: Dietician

## 2016-10-20 ENCOUNTER — Encounter: Payer: Managed Care, Other (non HMO) | Attending: Family Medicine | Admitting: Dietician

## 2016-10-20 DIAGNOSIS — E119 Type 2 diabetes mellitus without complications: Secondary | ICD-10-CM | POA: Insufficient documentation

## 2016-10-20 DIAGNOSIS — Z6841 Body Mass Index (BMI) 40.0 and over, adult: Secondary | ICD-10-CM | POA: Insufficient documentation

## 2016-10-20 DIAGNOSIS — Z713 Dietary counseling and surveillance: Secondary | ICD-10-CM | POA: Insufficient documentation

## 2016-10-20 NOTE — Patient Instructions (Addendum)
Try to stay active. Increase your intake of non-starchy vegetables. Bake more often, consider decreasing your added fat. Rethink what you drink.    Aim for 4 Carb Choices per meal (60 grams) +/- 1 either way  Aim for 0-2 Carbs per snack if hungry  Include protein in moderation with your meals and snacks Consider reading food labels for Total Carbohydrate and Fat Grams of foods Consider  increasing your activity level by walking for 30 minutes daily as tolerated Consider checking BG at alternate times per day as directed by MD  Consider taking medication as directed by MD

## 2016-10-21 ENCOUNTER — Other Ambulatory Visit: Payer: Self-pay | Admitting: *Deleted

## 2016-10-21 DIAGNOSIS — Z171 Estrogen receptor negative status [ER-]: Secondary | ICD-10-CM | POA: Insufficient documentation

## 2016-10-21 DIAGNOSIS — C50211 Malignant neoplasm of upper-inner quadrant of right female breast: Secondary | ICD-10-CM

## 2016-10-21 HISTORY — DX: Estrogen receptor negative status (ER-): Z17.1

## 2016-10-21 HISTORY — DX: Estrogen receptor negative status (ER-): C50.211

## 2016-10-21 NOTE — Progress Notes (Signed)
Diabetes Self-Management Education  Visit Type: First/Initial  Appt. Start Time: 1430 Appt. End Time: 5573  10/21/2016  [name], identified by name and date of birth, is a 41 y.o. female with a diagnosis of Diabetes: Type 2. This is newly diagnosed.  She states that her and her husband had not medical insurance and no medical care for several years and they just received medical insurance, obtained physicals and both were found to have type 2 diabetes.  She was also diagnosed with breast cancer and is to meet with her oncologist this week.  She smokes.  She is here today with her husband.  Her husband was on his cell phone during much of the appointment.    Medications include:  DEPO and Metformin  Patient lives with her husband and 82 and 76 yo children.  Patient does most of the cooking except when her husband is home.  They share shopping.  She is currently on medical leave.  She works from home for [employer] and takes complaint calls.  ASSESSMENT  Height 6\' 2"  (1.88 m), weight (!) 351 lb (159.2 kg), last menstrual period 10/06/2016. Body mass index is 45.07 kg/m.  Lowest adult weight 226 lbs.  She has lost from 366 lbs since her diabetes diagnosis 09/15/16.        Diabetes Self-Management Education - 10/20/16 1448      Visit Information   Visit Type First/Initial     Initial Visit   Diabetes Type Type 2   Are you currently following a meal plan? No   Are you taking your medications as prescribed? Yes   Date Diagnosed 09/2016     Health Coping   How would you rate your overall health? Fair     Psychosocial Assessment   Patient Belief/Attitude about Diabetes Motivated to manage diabetes   Self-care barriers Other (comment)  increased stress   Self-management support Doctor's office;Family   Other persons present Patient;Spouse/SO   Patient Concerns Nutrition/Meal planning;Glycemic Control;Weight Control   Special Needs None   Preferred Learning Style No preference  indicated   Learning Readiness Ready   How often do you need to have someone help you when you read instructions, pamphlets, or other written materials from your doctor or pharmacy? 1 - Never   What is the last grade level you completed in school? 2 years college     Pre-Education Assessment   Patient understands the diabetes disease and treatment process. Needs Instruction   Patient understands incorporating nutritional management into lifestyle. Needs Instruction   Patient undertands incorporating physical activity into lifestyle. Needs Instruction   Patient understands using medications safely. Needs Instruction   Patient understands monitoring blood glucose, interpreting and using results Needs Instruction   Patient understands prevention, detection, and treatment of acute complications. Needs Instruction   Patient understands prevention, detection, and treatment of chronic complications. Needs Instruction   Patient understands how to develop strategies to address psychosocial issues. Needs Instruction   Patient understands how to develop strategies to promote health/change behavior. Needs Instruction     Complications   Last HgB A1C per patient/outside source 8.8 %  09/15/16   How often do you check your blood sugar? 1-2 times/day   Fasting Blood glucose range (mg/dL) 180-200   Postprandial Blood glucose range (mg/dL) 70-129;130-179   Number of hypoglycemic episodes per month 0   Number of hyperglycemic episodes per week 1   Can you tell when your blood sugar is high? No   Have  you had a dilated eye exam in the past 12 months? Yes   Have you had a dental exam in the past 12 months? No   Are you checking your feet? Yes   How many days per week are you checking your feet? 7     Dietary Intake   Breakfast 2 eggs, toast (1-2 times per week), grits, and butter OR sargento smart balance (cheese, nuts, dried fruit) AND coffee with 1 tsp sugar and sugar free creamer or whole milk   6:30-9:30   Snack (morning) none   Lunch skips if she eats breakfast OR leftovers if no breakfast  1-3   Snack (afternoon) Smart Balance by [employer] OR cereal and whole milk OR raw veges OR PB&J sandwich on honey wheat OR [place] wrap   Dinner meat (usually pork or steak), vegetables, Trying to avoid potatoes and rice  7   Snack (evening) fruit or raw veges OR smart balance break   Beverage(s) coffee with sugar free creamer and 1 tsp sugar, regular or diet soda on the weekend, water, sparkling water     Exercise   Exercise Type ADL's  [employer] memberships but does not use   How many days per week to you exercise? 0   How many minutes per day do you exercise? 0   Total minutes per week of exercise 0     Patient Education   Previous Diabetes Education No   Disease state  Definition of diabetes, type 1 and 2, and the diagnosis of diabetes   Nutrition management  Role of diet in the treatment of diabetes and the relationship between the three main macronutrients and blood glucose level;Food label reading, portion sizes and measuring food.;Carbohydrate counting;Meal options for control of blood glucose level and chronic complications.;Information on hints to eating out and maintain blood glucose control.   Physical activity and exercise  Role of exercise on diabetes management, blood pressure control and cardiac health.;Helped patient identify appropriate exercises in relation to his/her diabetes, diabetes complications and other health issue.   Monitoring Purpose and frequency of SMBG.;Identified appropriate SMBG and/or A1C goals.   Chronic complications Relationship between chronic complications and blood glucose control;Assessed and discussed foot care and prevention of foot problems;Dental care;Retinopathy and reason for yearly dilated eye exams   Psychosocial adjustment Worked with patient to identify barriers to care and solutions;Role of stress on diabetes;Brainstormed with patient on  coping mechanisms for social situations, getting support from significant others, dealing with feelings about diabetes   Personal strategies to promote health Review risk of smoking and offered smoking cessation;Lifestyle issues that need to be addressed for better diabetes care     Individualized Goals (developed by patient)   Nutrition General guidelines for healthy choices and portions discussed   Physical Activity Exercise 5-7 days per week;30 minutes per day;60 minutes per day   Medications take my medication as prescribed   Monitoring  test my blood glucose as discussed   Problem Solving heatlhy eating for the family   Reducing Risk stop smoking;do foot checks daily   Health Coping discuss diabetes with (comment)  MD/RD     Post-Education Assessment   Patient understands the diabetes disease and treatment process. Demonstrates understanding / competency   Patient understands incorporating nutritional management into lifestyle. Demonstrates understanding / competency   Patient undertands incorporating physical activity into lifestyle. Demonstrates understanding / competency   Patient understands using medications safely. Demonstrates understanding / competency   Patient understands monitoring blood glucose,  interpreting and using results Demonstrates understanding / competency   Patient understands prevention, detection, and treatment of acute complications. Demonstrates understanding / competency   Patient understands prevention, detection, and treatment of chronic complications. Demonstrates understanding / competency   Patient understands how to develop strategies to address psychosocial issues. Demonstrates understanding / competency   Patient understands how to develop strategies to promote health/change behavior. Demonstrates understanding / competency     Outcomes   Expected Outcomes Demonstrated interest in learning. Expect positive outcomes   Future DMSE PRN   Program Status  Completed      Individualized Plan for Diabetes Self-Management Training:   Learning Objective:  Patient will have a greater understanding of diabetes self-management. Patient education plan is to attend individual and/or group sessions per assessed needs and concerns.   Plan:   Patient Instructions  Try to stay active. Increase your intake of non-starchy vegetables. Bake more often, consider decreasing your added fat. Rethink what you drink.    Aim for 4 Carb Choices per meal (60 grams) +/- 1 either way  Aim for 0-2 Carbs per snack if hungry  Include protein in moderation with your meals and snacks Consider reading food labels for Total Carbohydrate and Fat Grams of foods Consider  increasing your activity level by walking for 30 minutes daily as tolerated Consider checking BG at alternate times per day as directed by MD  Consider taking medication as directed by MD      Expected Outcomes:  Demonstrated interest in learning. Expect positive outcomes  Education material provided: Living Well with Diabetes, Food label handouts, A1C conversion sheet, Meal plan card, My Plate and Snack sheet  If problems or questions, patient to contact team via:  Phone and Email  Future DSME appointment: PRN

## 2016-10-22 ENCOUNTER — Telehealth: Payer: Self-pay | Admitting: Oncology

## 2016-10-22 ENCOUNTER — Ambulatory Visit: Payer: Managed Care, Other (non HMO) | Attending: Surgery | Admitting: Physical Therapy

## 2016-10-22 ENCOUNTER — Ambulatory Visit: Payer: Self-pay | Admitting: Surgery

## 2016-10-22 ENCOUNTER — Other Ambulatory Visit (HOSPITAL_COMMUNITY)
Admission: RE | Admit: 2016-10-22 | Discharge: 2016-10-22 | Disposition: A | Discharge status: Home / Self Care | Payer: Managed Care, Other (non HMO) | Source: Ambulatory Visit | Attending: Oncology | Admitting: Oncology

## 2016-10-22 ENCOUNTER — Encounter: Payer: Self-pay | Admitting: Oncology

## 2016-10-22 ENCOUNTER — Other Ambulatory Visit (HOSPITAL_BASED_OUTPATIENT_CLINIC_OR_DEPARTMENT_OTHER): Payer: Managed Care, Other (non HMO)

## 2016-10-22 ENCOUNTER — Ambulatory Visit
Admission: RE | Admit: 2016-10-22 | Discharge: 2016-10-22 | Disposition: A | Discharge status: Home / Self Care | Payer: Managed Care, Other (non HMO) | Source: Ambulatory Visit | Attending: Radiation Oncology | Admitting: Radiation Oncology

## 2016-10-22 ENCOUNTER — Ambulatory Visit (HOSPITAL_BASED_OUTPATIENT_CLINIC_OR_DEPARTMENT_OTHER): Payer: Managed Care, Other (non HMO) | Admitting: Oncology

## 2016-10-22 ENCOUNTER — Encounter: Payer: Self-pay | Admitting: Physical Therapy

## 2016-10-22 VITALS — BP 151/91 | HR 82 | Temp 98.4°F | Resp 18 | Ht 74.0 in | Wt 351.9 lb

## 2016-10-22 DIAGNOSIS — C50211 Malignant neoplasm of upper-inner quadrant of right female breast: Secondary | ICD-10-CM

## 2016-10-22 DIAGNOSIS — Z171 Estrogen receptor negative status [ER-]: Secondary | ICD-10-CM | POA: Insufficient documentation

## 2016-10-22 DIAGNOSIS — R293 Abnormal posture: Secondary | ICD-10-CM | POA: Insufficient documentation

## 2016-10-22 LAB — COMPREHENSIVE METABOLIC PANEL
ALT: 22 U/L (ref 0–55)
AST: 16 U/L (ref 5–34)
Albumin: 3.7 g/dL (ref 3.5–5.0)
Alkaline Phosphatase: 85 U/L (ref 40–150)
Anion Gap: 10 mEq/L (ref 3–11)
BUN: 9.6 mg/dL (ref 7.0–26.0)
CO2: 22 mEq/L (ref 22–29)
Calcium: 9.9 mg/dL (ref 8.4–10.4)
Chloride: 106 mEq/L (ref 98–109)
Creatinine: 0.8 mg/dL (ref 0.6–1.1)
EGFR: >90 mL/min/{1.73_m2} (ref >90)
Glucose: 154 mg/dl — ABNORMAL HIGH (ref 70–140)
Potassium: 4.1 mEq/L (ref 3.5–5.1)
Sodium: 138 mEq/L (ref 136–145)
Total Bilirubin: 0.5 mg/dL (ref 0.20–1.20)
Total Protein: 7.2 g/dL (ref 6.4–8.3)

## 2016-10-22 LAB — CBC WITH DIFFERENTIAL/PLATELET
BASO%: 0.7 % (ref 0.0–2.0)
Basophils Absolute: 0.1 10*3/uL (ref 0.0–0.1)
EOS%: 1.7 % (ref 0.0–7.0)
Eosinophils Absolute: 0.1 10*3/uL (ref 0.0–0.5)
HCT: 42 % (ref 34.8–46.6)
HGB: 13.7 g/dL (ref 11.6–15.9)
LYMPH%: 25.4 % (ref 14.0–49.7)
MCH: 24.7 pg — ABNORMAL LOW (ref 25.1–34.0)
MCHC: 32.5 g/dL (ref 31.5–36.0)
MCV: 76.1 fL — ABNORMAL LOW (ref 79.5–101.0)
MONO#: 0.4 10*3/uL (ref 0.1–0.9)
MONO%: 4.7 % (ref 0.0–14.0)
NEUT#: 5 10*3/uL (ref 1.5–6.5)
NEUT%: 67.5 % (ref 38.4–76.8)
Platelets: 309 10*3/uL (ref 145–400)
RBC: 5.52 10*6/uL — ABNORMAL HIGH (ref 3.70–5.45)
RDW: 15.4 % — ABNORMAL HIGH (ref 11.2–14.5)
WBC: 7.5 10*3/uL (ref 3.9–10.3)
lymph#: 1.9 10*3/uL (ref 0.9–3.3)

## 2016-10-22 NOTE — Patient Instructions (Signed)

## 2016-10-22 NOTE — Progress Notes (Signed)
Nutrition Assessment  Reason for Assessment:  Pt seen in Breast Clinic  ASSESSMENT:   41 year old female with new diagnosis of right breast cancer.  Planning neo-adjuvant chemotherapy.  Patient with new diagnosis of DM.    Noted patient saw RD on 4/9 for management of DM. Patient reports that she is reading food labels and starting to make adjustments in diet to control blood glucose.    Medications:  metformin  Labs: glucose 154  Anthropometrics:   Height: 74 inches Weight: 351 lb 14.4 oz BMI: 45.3   NUTRITION DIAGNOSIS: Food and nutrition related knowledge deficit related to new diagnosis of breast cancer as evidenced by no prior need for nutrition related information.  INTERVENTION:   Discussed and provided packet of information regarding nutritional tips for breast cancer patients.  Discussed continued importance of reading food labels and counting carbohydrates for good blood glucose control during cancer treatment.  Encouraged patient to keep follow-up appointments with outpatient RD for DM management.  Questions answered.  Teachback method used.  Contact information provided and patient knows to contact me with questions/concerns.   MONITORING, EVALUATION, and GOAL: Pt will consume a healthy plant based diet to maintain lean body mass throughout treatment.   Kelly Mendez B. Kelly Mendez, Kelly Mendez, Kelly Mendez Registered Dietitian [phone] (pager)

## 2016-10-22 NOTE — Progress Notes (Signed)
Kelly Mendez  [phone] Fax:([phone]) [phone]     ID: Kelly Mendez DOB: May 22, 1976  MR#: [id number]  EXB#:[id number]  Patient Care Team: [name], NP-C as PCP - General (Family Medicine) [name], MD as Consulting Physician (General Surgery) Chauncey Cruel, MD as Consulting Physician (Oncology) Eppie Gibson, MD as Attending Physician (Radiation Oncology) Chauncey Cruel, MD OTHER MD:  CHIEF COMPLAINT: Triple negative breast cancer  CURRENT TREATMENT: Neoadjuvant chemotherapy   BREAST CANCER HISTORY: Kelly Mendez had her first ever mammogram 10/07/2016 at the [facility]. This showed a possible mass in the right breast. On 10/13/2016 she underwent bilateral diagnostic mammography with tomography and right breast ultrasonography. This found the breast density to be category B. In the upper inner quadrant of the right breast there was a circumscribed mass which was barely palpable. Ultrasonography confirmed a 0.9 cm right breast mass at the 1:00 radiant 18 cm from the nipple ultrasound of the axilla showed 1 indeterminate right axillary lymph node with borderline thickening of the anterior cortex.  On 10/16/2016 the patient underwent biopsy of the right breast mass and the suspicious right axillary lymph node. The right breast mass proved to be an invasive ductal carcinoma, grade 3, estrogen and progesterone receptor negative, with an MIB-1 of 80%, and no HER-2 amplification, the signals ratio being 1.30 and the number per cell 1.75. The lymph node was negative and concordant.  Her subsequent history is as detailed below  INTERVAL HISTORY: Kelly Mendez was evaluated in the multidisciplinary breast cancer clinic 10/22/2016 accompanied by her mother. Her case was also presented in the multidisciplinary breast cancer conference that same morning. At that time a preliminary plan was proposed: Breast conserving surgery with port placement, adjuvant or neoadjuvant  chemotherapy, then radiation. The patient also qualifies for genetics testing.  REVIEW OF SYSTEMS: There were no specific symptoms leading to the original mammogram, which was routinely scheduled. The patient denies unusual headaches, visual changes, nausea, vomiting, stiff neck, dizziness, or gait imbalance. There has been no cough, phlegm production, or pleurisy, no chest pain or pressure, and no change in bowel or bladder habits. The patient denies fever, rash, bleeding, unexplained fatigue or unexplained weight loss. A detailed review of systems was otherwise entirely negative.   PAST MEDICAL HISTORY: Past Medical History:  Diagnosis Date  . Abnormal Pap smear of cervix   . Anxiety   . Asthma   . Cancer (Kelly Mendez)   . Depression   . Diabetes type 2, uncontrolled ([place])    new diagnosis in 08/2016  . HPV in female     PAST SURGICAL HISTORY: Past Surgical History:  Procedure Laterality Date  . COLPOSCOPY    . left ovary removed  1978    FAMILY HISTORY Family History  Problem Relation Age of Onset  . Breast cancer Mother   . Hepatitis C Father   The patient's mother was diagnosed with breast cancer at age 31, but tells me the lump in her breast had been present for at least 10 years prior to that. She is now 15 and doing well. The patient's father died at the age of 28 from sepsis following liver transplantation. The patient has 2 brothers, no sisters. The patient tells me that she has at least 5 and sent cousins with breast cancer and there are other relatives with uterine cancer.  GYNECOLOGIC HISTORY:  Patient's last menstrual period was 10/06/2016. Menarche age 41, first live birth age 41, the patient is GX P1. She has had  multiple progesterone and oral contraceptive treatments through [facility] because of her menometrorrhagia. 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 Her husband [name] is a  truck Geophysicist/field seismologist. The patient's daughter Kelly Mendez is studying pre-veterinary medicine in college. [name] has 3 children, all boys, a keen is a cook in [place] and lives independently. The 2 younger boys, [name] and [name], aged 36 and 69 as of April 2018, are at home with the patient    ADVANCED DIRECTIVES: Not in place   HEALTH MAINTENANCE: Social History  Substance Use Topics  . Smoking status: Current Every Day Smoker    Packs/day: 0.10    Years: 25.00    Types: Cigarettes  . Smokeless tobacco: Never Used  . Alcohol use 1.8 oz/week    3 Glasses of wine per week     Comment: on average 3 drinks a week     Colonoscopy: n/a  PAP:  Bone density:   Allergies  Allergen Reactions  . Penicillins     Current Outpatient Prescriptions  Medication Sig Dispense Refill  . atorvastatin (LIPITOR) 20 MG tablet Take 1 tablet (20 mg total) by mouth daily. 30 tablet 1  . estradiol cypionate (DEPO-ESTRADIOL) 5 MG/ML injection Inject into the muscle every 28 (twenty-eight) days.    . Glucose Blood (BLOOD GLUCOSE TEST STRIPS) STRP Test twice a day. Pt uses one touch verio flex meter 100 each 5  . IBUPROFEN PO Take by mouth.    . Melatonin 1 MG TABS Take by mouth.    . metFORMIN (GLUCOPHAGE) 1000 MG tablet Take 1 tablet (1,000 mg total) by mouth 2 (two) times daily with a meal. 60 tablet 2  . ONETOUCH DELICA LANCETS FINE MISC Test twice a day 100 each 5   No current facility-administered medications for this visit.     OBJECTIVE: Morbidly obese African-American woman who appears stated age 41:   10/22/16 1258  BP: (!) 151/91  Pulse: 82  Resp: 18  Temp: 98.4 F (36.9 C)     Body mass index is 45.18 kg/m.    ECOG FS:0 - Asymptomatic  Ocular: Sclerae unicteric, pupils equal, round and reactive to light Ear-nose-throat: Oropharynx clear and moist Lymphatic: No cervical or supraclavicular adenopathy Lungs no rales or rhonchi, good excursion bilaterally Heart regular rate and rhythm,  no murmur appreciated Abd soft, obese, nontender, positive bowel sounds MSK no focal spinal tenderness, no joint edema Neuro: non-focal, well-oriented, appropriate affect Breasts: I do not palpate a mass in either breast. There are no skin or nipple changes of concern. Both axillae are benign   LAB RESULTS:  CMP     Component Value Date/Time   NA 138 10/22/2016 1216   K 4.1 10/22/2016 1216   CL 106 09/15/2016 1009   CO2 22 10/22/2016 1216   GLUCOSE 154 (H) 10/22/2016 1216   BUN 9.6 10/22/2016 1216   CREATININE 0.8 10/22/2016 1216   CALCIUM 9.9 10/22/2016 1216   PROT 7.2 10/22/2016 1216   ALBUMIN 3.7 10/22/2016 1216   AST 16 10/22/2016 1216   ALT 22 10/22/2016 1216   ALKPHOS 85 10/22/2016 1216   BILITOT 0.50 10/22/2016 1216    No results found for: TOTALPROTELP, ALBUMINELP, A1GS, A2GS, BETS, BETA2SER, GAMS, MSPIKE, SPEI  No results found for: KPAFRELGTCHN, LAMBDASER, KAPLAMBRATIO  Lab Results  Component Value Date   WBC 7.5 10/22/2016   NEUTROABS 5.0 10/22/2016   HGB 13.7 10/22/2016  HCT 42.0 10/22/2016   MCV 76.1 (L) 10/22/2016   PLT 309 10/22/2016      Chemistry      Component Value Date/Time   NA 138 10/22/2016 1216   K 4.1 10/22/2016 1216   CL 106 09/15/2016 1009   CO2 22 10/22/2016 1216   BUN 9.6 10/22/2016 1216   CREATININE 0.8 10/22/2016 1216      Component Value Date/Time   CALCIUM 9.9 10/22/2016 1216   ALKPHOS 85 10/22/2016 1216   AST 16 10/22/2016 1216   ALT 22 10/22/2016 1216   BILITOT 0.50 10/22/2016 1216       No results found for: LABCA2  No components found for: [id number]  No results for input(s): INR in the last 168 hours.  Urinalysis    Component Value Date/Time   BILIRUBINUR n 09/29/2016 1045   PROTEINUR trace 09/29/2016 1045   UROBILINOGEN negative 09/29/2016 1045   NITRITE n 09/29/2016 1045   LEUKOCYTESUR Negative 09/29/2016 1045     STUDIES: Mm Digital Screening Bilateral  Result Date: 10/07/2016 CLINICAL DATA:   Screening. EXAM: DIGITAL SCREENING BILATERAL MAMMOGRAM WITH CAD COMPARISON:  Baseline mammogram. ACR Breast Density Category b: There are scattered areas of fibroglandular density. FINDINGS: In the right breast, a possible mass warrants further evaluation. In the left breast, no findings suspicious for malignancy. Images were processed with CAD. IMPRESSION: Further evaluation is suggested for possible mass in the right breast. RECOMMENDATION: Diagnostic mammogram and possibly ultrasound of the right breast. (Code:[id number]) The patient will be contacted regarding the findings, and additional imaging will be scheduled. BI-RADS CATEGORY  0: Incomplete. Need additional imaging evaluation and/or prior mammograms for comparison. Electronically Signed   By: Fidela Salisbury M.D.   On: 10/07/2016 11:22   US Breast Ltd Uni Right Inc Axilla  Result Date: 10/13/2016 CLINICAL DATA:  Right breast slightly upper inner quadrant focal asymmetry seen on recent screening mammography. EXAM: 2D DIGITAL DIAGNOSTIC RIGHT MAMMOGRAM WITH CAD AND ADJUNCT TOMO ULTRASOUND RIGHT BREAST COMPARISON:  Previous exam(s). ACR Breast Density Category b: There are scattered areas of fibroglandular density. FINDINGS: Additional mammographic views of the right breast demonstrate persistent circumscribed mass in the slightly upper inner right breast, posterior depth, measuring 9 mm mammographically. Mammographic images were processed with CAD. On physical exam, there is a subtle palpable approximately 1 cm mass in the right breast upper inner quadrant, posterior depth. Targeted ultrasound is performed, showing right breast 1 o'clock 18 cm from the nipple hypoechoic lobulated mass measuring 0.9 x 0.6 x 0.8 cm. Wide hyperechoic halo is seen surrounding the mass. Ultrasound evaluation of the right axilla demonstrates 1 indeterminate low right axillary lymph node with borderline thickening of its anterior cortex. IMPRESSION: 9 mm right breast 1 o'clock  mass, for which ultrasound-guided core needle biopsy is recommended. 1 indeterminate low right axillary lymph node, for which ultrasound-guided core needle biopsy is recommended. RECOMMENDATION: Ultrasound-guided core needle biopsy of right breast and right axilla. I have discussed the findings and recommendations with the patient. Results were also provided in writing at the conclusion of the visit. If applicable, a reminder letter will be sent to the patient regarding the next appointment. BI-RADS CATEGORY  4: Suspicious. Electronically Signed   By: Fidela Salisbury M.D.   On: 10/13/2016 15:13   Mm Diag Breast Tomo Uni Right  Result Date: 10/13/2016 CLINICAL DATA:  Right breast slightly upper inner quadrant focal asymmetry seen on recent screening mammography. EXAM: 2D DIGITAL DIAGNOSTIC RIGHT MAMMOGRAM WITH CAD AND ADJUNCT  TOMO ULTRASOUND RIGHT BREAST COMPARISON:  Previous exam(s). ACR Breast Density Category b: There are scattered areas of fibroglandular density. FINDINGS: Additional mammographic views of the right breast demonstrate persistent circumscribed mass in the slightly upper inner right breast, posterior depth, measuring 9 mm mammographically. Mammographic images were processed with CAD. On physical exam, there is a subtle palpable approximately 1 cm mass in the right breast upper inner quadrant, posterior depth. Targeted ultrasound is performed, showing right breast 1 o'clock 18 cm from the nipple hypoechoic lobulated mass measuring 0.9 x 0.6 x 0.8 cm. Wide hyperechoic halo is seen surrounding the mass. Ultrasound evaluation of the right axilla demonstrates 1 indeterminate low right axillary lymph node with borderline thickening of its anterior cortex. IMPRESSION: 9 mm right breast 1 o'clock mass, for which ultrasound-guided core needle biopsy is recommended. 1 indeterminate low right axillary lymph node, for which ultrasound-guided core needle biopsy is recommended. RECOMMENDATION:  Ultrasound-guided core needle biopsy of right breast and right axilla. I have discussed the findings and recommendations with the patient. Results were also provided in writing at the conclusion of the visit. If applicable, a reminder letter will be sent to the patient regarding the next appointment. BI-RADS CATEGORY  4: Suspicious. Electronically Signed   By: Fidela Salisbury M.D.   On: 10/13/2016 15:13   [place] Axillary Node Core Biopsy Right  Addendum Date: 10/17/2016   ADDENDUM REPORT: 10/17/2016 11:26 ADDENDUM: Pathology revealed grade III invasive ductal carcinoma in the right breast and no evidence of carcinoma in the right axillary lymph node. This was found to be concordant by Dr. Claudie Revering. Pathology results were discussed with the patient by telephone. The patient reported doing well after the biopsies with tenderness at the sites. Post biopsy instructions and care were reviewed and questions were answered. The patient was encouraged to call [facility] for any additional concerns. The patient was referred to the [facility] at the [facility] on October 22, 2016. Pathology results reported by Susa Raring RN, BSN on 10/17/2016. Electronically Signed   By: Claudie Revering M.D.   On: 10/17/2016 11:26   Result Date: 10/17/2016 CLINICAL DATA:  Borderline abnormal right axillary lymph node at recent ultrasound. EXAM: ULTRASOUND GUIDED CORE NEEDLE BIOPSY OF A RIGHT AXILLARY NODE COMPARISON:  Previous exam(s). FINDINGS: I met with the patient and we discussed the procedure of ultrasound-guided biopsy, including benefits and alternatives. We discussed the high likelihood of a successful procedure. We discussed the risks of the procedure, including infection, bleeding, tissue injury, clip migration, and inadequate sampling. Informed written consent was given. The usual time-out protocol was performed immediately prior to the procedure.  Using sterile technique and 1% Lidocaine as local anesthetic, under direct ultrasound visualization, a 14 gauge spring-loaded device was used to perform biopsy of the recently demonstrated borderline abnormal right axillary lymph node using a medial approach. At the conclusion of the procedure a HydroMARK tissue marker clip was deployed into the biopsy cavity. Follow up 2 view mammogram was performed and dictated separately. IMPRESSION: Ultrasound guided biopsy of the recently demonstrated borderline abnormal right axillary lymph node. No apparent complications. Electronically Signed: By: Claudie Revering M.D. On: 10/16/2016 15:03   Mm Clip Placement Right  Result Date: 10/16/2016 CLINICAL DATA:  Status post ultrasound-guided core needle biopsies of a 9 mm mass in the 1 o'clock position of the right breast and a borderline abnormal right axillary lymph node. EXAM: DIAGNOSTIC RIGHT MAMMOGRAM POST ULTRASOUND  BIOPSY COMPARISON:  Previous exam(s). FINDINGS: Mammographic images were obtained following ultrasound guided biopsy of a 9 mm mass in the 1 o'clock position of the right breast and a borderline abnormal right axillary lymph node. These demonstrate a ribbon shaped biopsy marker clip within the biopsied breast mass and a HydroMARK biopsy marker clip within the biopsied lymph node. IMPRESSION: Appropriate deployment of the biopsy marker clips following right breast and right axillary lymph node biopsies. Final Assessment: Post Procedure Mammograms for Marker Placement Electronically Signed   By: Claudie Revering M.D.   On: 10/16/2016 15:23   [place] Rt Breast Bx W Loc Dev 1st Lesion Img Bx Spec US Guide  Addendum Date: 10/17/2016   ADDENDUM REPORT: 10/17/2016 11:26 ADDENDUM: Pathology revealed grade III invasive ductal carcinoma in the right breast and no evidence of carcinoma in the right axillary lymph node. This was found to be concordant by Dr. Claudie Revering. Pathology results were discussed with the patient by  telephone. The patient reported doing well after the biopsies with tenderness at the sites. Post biopsy instructions and care were reviewed and questions were answered. The patient was encouraged to call [facility] for any additional concerns. The patient was referred to the [facility] at the [facility] on October 22, 2016. Pathology results reported by Susa Raring RN, BSN on 10/17/2016. Electronically Signed   By: Claudie Revering M.D.   On: 10/17/2016 11:26   Result Date: 10/17/2016 CLINICAL DATA:  9 mm mass in the 1 o'clock position of the right breast. EXAM: ULTRASOUND GUIDED RIGHT BREAST CORE NEEDLE BIOPSY COMPARISON:  Previous exam(s). FINDINGS: I met with the patient and we discussed the procedure of ultrasound-guided biopsy, including benefits and alternatives. We discussed the high likelihood of a successful procedure. We discussed the risks of the procedure, including infection, bleeding, tissue injury, clip migration, and inadequate sampling. Informed written consent was given. The usual time-out protocol was performed immediately prior to the procedure. Lesion quadrant: Right upper inner quadrant Using sterile technique and 1% Lidocaine as local anesthetic, under direct ultrasound visualization, a 12 gauge spring-loaded device was used to perform biopsy of the recently demonstrated 9 mm mass in the 1 o'clock position of the right breast using a caudal approach. At the conclusion of the procedure a ribbon shaped tissue marker clip was deployed into the biopsy cavity. Follow up 2 view mammogram was performed and dictated separately. IMPRESSION: Ultrasound guided biopsy of a 9 mm mass in the 1 o'clock position of the right breast. No apparent complications. Electronically Signed: By: Claudie Revering M.D. On: 10/16/2016 14:58    ELIGIBLE FOR AVAILABLE RESEARCH PROTOCOL: no  ASSESSMENT: 41 y.o.  Kelly Mendez woman status post right  breast upper inner quadrant biopsy 10/16/2016 for a clinical T1b pN0, stage IB invasive ductal carcinoma, grade 3, triple negative, with an MIB-1 of 80%.  (1)  genetics testing pending   (2) neoadjuvant chemotherapy to consist of doxorubicin and cyclophosphamide in dose dense fashion 4 followed by weekly paclitaxel 12  (3) breast conserving surgery to follow  (4) adjuvant radiation to follow chemotherapy     PLAN: We spent the better part of today's hour-long appointment discussing the biology of breast cancer in general, and the specifics of the patient's tumor in particular.  We first reviewed the fact that cancer is not one disease but more than 100 different diseases and that it is important to keep them separate-- otherwise when friends  and relatives discuss their own cancer experiences with Kelly Mendez confusion can result. Similarly we explained that if breast cancer spreads to the bone or liver, the patient would not have bone cancer or liver cancer, but breast cancer in the bone and breast cancer in the liver: one cancer in three places-- not 3 different cancers which otherwise would have to be treated in 3 different ways.  We discussed the difference between local and systemic therapy. In terms of loco-regional treatment, lumpectomy plus radiation is equivalent to mastectomy as far as survival is concerned. For this reason, and because the cosmetic results are generally superior, we recommend breast conserving surgery. We also noted that in terms of sequencing of treatments, whether systemic therapy or surgery is done first does not affect the ultimate outcome. This is relevant to Kelly Mendez's case   We then discussed the rationale for systemic therapy. There is some risk that this cancer may have already spread to other parts of her body. Patients frequently ask at this point about bone scans, CAT scans and PET scans to find out if they have occult breast cancer somewhere else. The problem is that  in early stage disease we are much more likely to find false positives then true cancers and this would expose the patient to unnecessary procedures as well as unnecessary radiation. Scans cannot answer the question the patient really would like to know, which is whether she has microscopic disease elsewhere in her body. For those reasons we do not recommend them.  Of course we would proceed to aggressive evaluation of any symptoms that might suggest metastatic disease, but that is not the case here.  Next we went over the options for systemic therapy which are anti-estrogens, anti-HER-2 immunotherapy, and chemotherapy. Kelly Mendez does not meet criteria for anti-HER-2 immunotherapy or anti-estrogens. The only systemic treatment available to her is chemotherapy and accordingly this is what we are planning.  We also discussed genetics testing If she does carry a deleterious mutation Safia would be very interested in bilateral mastectomies with reconstruction. All this takes time--time to get the test done, 2 gets the results, and if she does carry a mutation time to meet with plastics and plan the optimal reconstruction process.  For these reasons we are starting chemotherapy neoadjuvantly. We discussed the possible toxicities, side effects and complications of the current standard of care which is doxorubicin and cyclophosphamide in dose dense fashion 4 followed by paclitaxel weekly 12. We are hoping to start by 11/05/2016. Before that date she will have a baseline MRI, echocardiogram, and a port placed. She will also meet with our chemotherapy teaching nurse and finally she will return to see me a few days before starting chemotherapy to discuss how to take supportive medications.  Kelly Mendez has a good understanding of the overall plan. She agrees with it. She knows the goal of treatment in her case is cure, and that her chance for cure is high. She will call with any problems that may develop before her next  visit here.  Chauncey Cruel, MD   10/22/2016 6:37 PM Medical Oncology and Hematology [facility] [address] [place], [place] [address] Tel. [phone]    Fax. [phone]

## 2016-10-22 NOTE — Progress Notes (Signed)
START ON PATHWAY REGIMEN - Breast   Dose-Dense AC q14 days:   A cycle is every 14 days:     Doxorubicin      Cyclophosphamide      Pegfilgrastim   **Always confirm dose/schedule in your pharmacy ordering system**    Paclitaxel 80 mg/m2 Weekly:   Administer weekly:     Paclitaxel   **Always confirm dose/schedule in your pharmacy ordering system**    Patient Characteristics: Preoperative or Nonsurgical Candidate (Clinical Staging), Neoadjuvant Therapy followed by Surgery, Invasive Disease, Chemotherapy, HER2 Negative/Unknown/Equivocal, ER Negative, Platinum Therapy Not Indicated Therapeutic Status: Preoperative or Nonsurgical Candidate (Clinical Staging) AJCC M Category: cM0 Breast Surgical Plan: Neoadjuvant Therapy followed by Surgery AJCC 8 Stage Grouping: IB ER Status: Negative (-) HER2 Status: Negative (-) AJCC T Category: T1b AJCC Grade: G3 AJCC N Category: cN0 PR Status: Negative (-) Type of Therapy: Platinum Therapy Not Indicated  Intent of Therapy: Curative Intent, Discussed with Patient

## 2016-10-22 NOTE — Progress Notes (Signed)
Radiation Oncology         [phone]) [phone] ________________________________  Initial outpatient Consultation  Name: Kelly Mendez MRN: [id number]  Date: 10/22/2016  DOB: 06-20-1976  LN:[id number] Henson, NP-C  Cornett, [name], MD   REFERRING PHYSICIAN: [name], MD  DIAGNOSIS:    ICD-9-CM ICD-10-CM   1. 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 female who presented with possible right breast mass on mammogram on 10/07/16.  She underwent bilateral diagnostic mammography with tomography on 10/13/16, which revealed a circumscribed mass in the upper-inner quadrant of the right breast, which was barely palpable. Subsequent ultrasonography confirmed a 0.9 cm right breast mass at the 1:00 position, 18 cm from the nipple. Ultrasound of the right axilla showed 1 indeterminate right axillary lymph node with borderline thickening of the anterior cortex. Biopsy on 10/16/16 showed invasive ductal carcinoma with characteristics as described above in the diagnosis. Sampled right axillary lymph node was clear for malignancy.  The patient presents to the clinic today to discuss the role that radiation therapy may play in the treatment of her disease. She is accompanied by her mother today.  On review of systems, the patient is positive for night sweats, glasses/contacts, anxiety, and depression.  Of note, the patient was 11 at first menstrual period. Her most recent menstrual period was 09/29/16. She reports her menstruation to be irregular. The patient has carried one child to term. She reports currently using hormonal birth control in the form of Depo Provera  injection; her last injection was 09/02/16. The patient has never had a colonoscopy. She has never had a bone density scan. Her last pap smear was 09/02/16.  She works from home for a [employer].   PREVIOUS RADIATION THERAPY: No  PAST MEDICAL HISTORY:  has a past medical history of Abnormal Pap smear of cervix; Anxiety; Asthma; Cancer ([place]); Depression; Diabetes type 2, uncontrolled ([place]); and HPV in female.    PAST SURGICAL HISTORY: Past Surgical History:  Procedure Laterality Date  . COLPOSCOPY    . left ovary removed  1978    FAMILY HISTORY: family history includes Breast cancer in her mother; Hepatitis C in her father.  SOCIAL HISTORY:  reports that she has been smoking Cigarettes.  She has a 2.50 pack-year smoking history. She has never used smokeless tobacco. She reports that she drinks about 1.8 oz of alcohol per week . She reports that she does not use drugs. The patient lives in [place].  ALLERGIES: Penicillins  MEDICATIONS:  Current Outpatient Prescriptions  Medication Sig Dispense Refill  . atorvastatin (LIPITOR) 20 MG tablet Take 1 tablet (20 mg total) by mouth daily. 30 tablet 1  . estradiol cypionate (DEPO-ESTRADIOL) 5 MG/ML injection Inject into the muscle every 28 (twenty-eight) days.    . Glucose Blood (BLOOD GLUCOSE TEST STRIPS) STRP Test twice a day. Pt uses one touch verio flex meter 100 each 5  . IBUPROFEN PO Take by mouth.    . Melatonin 1 MG TABS Take by mouth.    . metFORMIN (GLUCOPHAGE) 1000 MG tablet Take 1 tablet (1,000 mg total) by mouth 2 (two) times daily with a meal. 60  tablet 2  . ONETOUCH DELICA LANCETS FINE MISC Test twice a day 100 each 5   No current facility-administered medications for this encounter.     REVIEW OF SYSTEMS: A 10+ POINT REVIEW OF SYSTEMS WAS OBTAINED including neurology, dermatology, psychiatry, cardiac, respiratory, lymph, extremities, GI, GU, Musculoskeletal, constitutional, breasts, reproductive, HEENT.  All  pertinent positives are noted in the HPI.  All others are negative.   PHYSICAL EXAM:   Vitals with BMI 10/22/2016  Height 6' 2"  Weight 351 lbs 14 oz  BMI 07.1  Systolic 219  Diastolic 91  Pulse 82  Respirations 18  General: Alert and oriented, in no acute distress. HEENT: Extraocular movements are intact. Oropharynx and oral cavity are clear. Neck: Neck is supple, no palpable cervical or supraclavicular lymphadenopathy or masses. Heart: Regular in rate and rhythm with no murmurs. Chest: Clear to auscultation bilaterally. Abdomen: Soft, non tender, non distended. Extremities: No edema in upper or lower extremities. Lymphatics: see Neck Exam Skin: No concerning lesions. Neurologic: EOMI. No obvious focalities. Speech is fluent. Coordination is intact. Psychiatric: Judgment and insight are intact. Affect is appropriate. Breasts: No palpable masses in bilateral breasts. Breast tissue is diffusely somewhat irregular, but there are no appreciable masses consistent with tumor in the breasts or axillary regions.   ECOG = 0   LABORATORY DATA:  Lab Results  Component Value Date   WBC 7.5 10/22/2016   HGB 13.7 10/22/2016   HCT 42.0 10/22/2016   MCV 76.1 (L) 10/22/2016   PLT 309 10/22/2016   CMP     Component Value Date/Time   NA 138 10/22/2016 1216   K 4.1 10/22/2016 1216   CL 106 09/15/2016 1009   CO2 22 10/22/2016 1216   GLUCOSE 154 (H) 10/22/2016 1216   BUN 9.6 10/22/2016 1216   CREATININE 0.8 10/22/2016 1216   CALCIUM 9.9 10/22/2016 1216   PROT 7.2 10/22/2016 1216   ALBUMIN 3.7 10/22/2016 1216   AST 16 10/22/2016 1216   ALT 22 10/22/2016 1216   ALKPHOS 85 10/22/2016 1216   BILITOT 0.50 10/22/2016 1216         RADIOGRAPHY: Mm Digital Screening Bilateral  Result Date: 10/07/2016 CLINICAL DATA:  Screening. EXAM: DIGITAL SCREENING BILATERAL MAMMOGRAM WITH CAD COMPARISON:  Baseline mammogram. ACR Breast Density Category b: There are scattered areas of fibroglandular  density. FINDINGS: In the right breast, a possible mass warrants further evaluation. In the left breast, no findings suspicious for malignancy. Images were processed with CAD. IMPRESSION: Further evaluation is suggested for possible mass in the right breast. RECOMMENDATION: Diagnostic mammogram and possibly ultrasound of the right breast. (Code:[id number]) The patient will be contacted regarding the findings, and additional imaging will be scheduled. BI-RADS CATEGORY  0: Incomplete. Need additional imaging evaluation and/or prior mammograms for comparison. Electronically Signed   By: Fidela Salisbury M.D.   On: 10/07/2016 11:22   US Breast Ltd Uni Right Inc Axilla  Result Date: 10/13/2016 CLINICAL DATA:  Right breast slightly upper inner quadrant focal asymmetry seen on recent screening mammography. EXAM: 2D DIGITAL DIAGNOSTIC RIGHT MAMMOGRAM WITH CAD AND ADJUNCT TOMO ULTRASOUND RIGHT BREAST COMPARISON:  Previous exam(s). ACR Breast Density Category b: There are scattered areas of fibroglandular density. FINDINGS: Additional mammographic views of the right breast demonstrate persistent circumscribed mass in the slightly upper inner right breast, posterior depth, measuring 9 mm mammographically. Mammographic images were processed with CAD. On physical exam, there is a subtle palpable approximately 1 cm mass in the right breast upper  inner quadrant, posterior depth. Targeted ultrasound is performed, showing right breast 1 o'clock 18 cm from the nipple hypoechoic lobulated mass measuring 0.9 x 0.6 x 0.8 cm. Wide hyperechoic halo is seen surrounding the mass. Ultrasound evaluation of the right axilla demonstrates 1 indeterminate low right axillary lymph node with borderline thickening of its anterior cortex. IMPRESSION: 9 mm right breast 1 o'clock mass, for which ultrasound-guided core needle biopsy is recommended. 1 indeterminate low right axillary lymph node, for which ultrasound-guided core needle biopsy is  recommended. RECOMMENDATION: Ultrasound-guided core needle biopsy of right breast and right axilla. I have discussed the findings and recommendations with the patient. Results were also provided in writing at the conclusion of the visit. If applicable, a reminder letter will be sent to the patient regarding the next appointment. BI-RADS CATEGORY  4: Suspicious. Electronically Signed   By: Fidela Salisbury M.D.   On: 10/13/2016 15:13   Mm Diag Breast Tomo Uni Right  Result Date: 10/13/2016 CLINICAL DATA:  Right breast slightly upper inner quadrant focal asymmetry seen on recent screening mammography. EXAM: 2D DIGITAL DIAGNOSTIC RIGHT MAMMOGRAM WITH CAD AND ADJUNCT TOMO ULTRASOUND RIGHT BREAST COMPARISON:  Previous exam(s). ACR Breast Density Category b: There are scattered areas of fibroglandular density. FINDINGS: Additional mammographic views of the right breast demonstrate persistent circumscribed mass in the slightly upper inner right breast, posterior depth, measuring 9 mm mammographically. Mammographic images were processed with CAD. On physical exam, there is a subtle palpable approximately 1 cm mass in the right breast upper inner quadrant, posterior depth. Targeted ultrasound is performed, showing right breast 1 o'clock 18 cm from the nipple hypoechoic lobulated mass measuring 0.9 x 0.6 x 0.8 cm. Wide hyperechoic halo is seen surrounding the mass. Ultrasound evaluation of the right axilla demonstrates 1 indeterminate low right axillary lymph node with borderline thickening of its anterior cortex. IMPRESSION: 9 mm right breast 1 o'clock mass, for which ultrasound-guided core needle biopsy is recommended. 1 indeterminate low right axillary lymph node, for which ultrasound-guided core needle biopsy is recommended. RECOMMENDATION: Ultrasound-guided core needle biopsy of right breast and right axilla. I have discussed the findings and recommendations with the patient. Results were also provided in writing  at the conclusion of the visit. If applicable, a reminder letter will be sent to the patient regarding the next appointment. BI-RADS CATEGORY  4: Suspicious. Electronically Signed   By: Fidela Salisbury M.D.   On: 10/13/2016 15:13   [place] Axillary Node Core Biopsy Right  Addendum Date: 10/17/2016   ADDENDUM REPORT: 10/17/2016 11:26 ADDENDUM: Pathology revealed grade III invasive ductal carcinoma in the right breast and no evidence of carcinoma in the right axillary lymph node. This was found to be concordant by Dr. Claudie Revering. Pathology results were discussed with the patient by telephone. The patient reported doing well after the biopsies with tenderness at the sites. Post biopsy instructions and care were reviewed and questions were answered. The patient was encouraged to call [facility] for any additional concerns. The patient was referred to the [facility] at the [facility] on October 22, 2016. Pathology results reported by Susa Raring RN, BSN on 10/17/2016. Electronically Signed   By: Claudie Revering M.D.   On: 10/17/2016 11:26   Result Date: 10/17/2016 CLINICAL DATA:  Borderline abnormal right axillary lymph node at recent ultrasound. EXAM: ULTRASOUND GUIDED CORE NEEDLE BIOPSY OF A RIGHT AXILLARY NODE COMPARISON:  Previous exam(s). FINDINGS: I met with the  patient and we discussed the procedure of ultrasound-guided biopsy, including benefits and alternatives. We discussed the high likelihood of a successful procedure. We discussed the risks of the procedure, including infection, bleeding, tissue injury, clip migration, and inadequate sampling. Informed written consent was given. The usual time-out protocol was performed immediately prior to the procedure. Using sterile technique and 1% Lidocaine as local anesthetic, under direct ultrasound visualization, a 14 gauge spring-loaded device was used to perform biopsy of the recently  demonstrated borderline abnormal right axillary lymph node using a medial approach. At the conclusion of the procedure a HydroMARK tissue marker clip was deployed into the biopsy cavity. Follow up 2 view mammogram was performed and dictated separately. IMPRESSION: Ultrasound guided biopsy of the recently demonstrated borderline abnormal right axillary lymph node. No apparent complications. Electronically Signed: By: Claudie Revering M.D. On: 10/16/2016 15:03   Mm Clip Placement Right  Result Date: 10/16/2016 CLINICAL DATA:  Status post ultrasound-guided core needle biopsies of a 9 mm mass in the 1 o'clock position of the right breast and a borderline abnormal right axillary lymph node. EXAM: DIAGNOSTIC RIGHT MAMMOGRAM POST ULTRASOUND BIOPSY COMPARISON:  Previous exam(s). FINDINGS: Mammographic images were obtained following ultrasound guided biopsy of a 9 mm mass in the 1 o'clock position of the right breast and a borderline abnormal right axillary lymph node. These demonstrate a ribbon shaped biopsy marker clip within the biopsied breast mass and a HydroMARK biopsy marker clip within the biopsied lymph node. IMPRESSION: Appropriate deployment of the biopsy marker clips following right breast and right axillary lymph node biopsies. Final Assessment: Post Procedure Mammograms for Marker Placement Electronically Signed   By: Claudie Revering M.D.   On: 10/16/2016 15:23   [place] Rt Breast Bx W Loc Dev 1st Lesion Img Bx Spec US Guide  Addendum Date: 10/17/2016   ADDENDUM REPORT: 10/17/2016 11:26 ADDENDUM: Pathology revealed grade III invasive ductal carcinoma in the right breast and no evidence of carcinoma in the right axillary lymph node. This was found to be concordant by Dr. Claudie Revering. Pathology results were discussed with the patient by telephone. The patient reported doing well after the biopsies with tenderness at the sites. Post biopsy instructions and care were reviewed and questions were answered. The patient was  encouraged to call [facility] for any additional concerns. The patient was referred to the [facility] at the [facility] on October 22, 2016. Pathology results reported by Susa Raring RN, BSN on 10/17/2016. Electronically Signed   By: Claudie Revering M.D.   On: 10/17/2016 11:26   Result Date: 10/17/2016 CLINICAL DATA:  9 mm mass in the 1 o'clock position of the right breast. EXAM: ULTRASOUND GUIDED RIGHT BREAST CORE NEEDLE BIOPSY COMPARISON:  Previous exam(s). FINDINGS: I met with the patient and we discussed the procedure of ultrasound-guided biopsy, including benefits and alternatives. We discussed the high likelihood of a successful procedure. We discussed the risks of the procedure, including infection, bleeding, tissue injury, clip migration, and inadequate sampling. Informed written consent was given. The usual time-out protocol was performed immediately prior to the procedure. Lesion quadrant: Right upper inner quadrant Using sterile technique and 1% Lidocaine as local anesthetic, under direct ultrasound visualization, a 12 gauge spring-loaded device was used to perform biopsy of the recently demonstrated 9 mm mass in the 1 o'clock position of the right breast using a caudal approach. At the conclusion of the procedure a ribbon shaped tissue marker clip  was deployed into the biopsy cavity. Follow up 2 view mammogram was performed and dictated separately. IMPRESSION: Ultrasound guided biopsy of a 9 mm mass in the 1 o'clock position of the right breast. No apparent complications. Electronically Signed: By: Claudie Revering M.D. On: 10/16/2016 14:58      IMPRESSION/PLAN: Right breast cancer, stage I It was a pleasure meeting the patient today. Neoadjuvant chemotherapy will be given, this will precede  surgery.  We discussed the risks, benefits, and side effects of radiotherapy. I recommend radiotherapy to the right breast to reduce her risk  of locoregional recurrence by 2/3.  We discussed that radiation would take approximately 7 weeks to complete and that I would give the patient a few weeks to heal following surgery before starting treatment planning.  We spoke about acute effects including skin irritation and fatigue as well as much less common late effects including internal organ injury or irritation. We spoke about the latest technology that is used to minimize the risk of late effects for patients undergoing radiotherapy to the breast or chest wall. No guarantees of treatment were given. The patient is enthusiastic about proceeding with treatment. I look forward to participating in the patient's care as indicated. I will await her referral back to me for postoperative follow-up and eventual CT simulation/treatment planning.  Based on the patient's family history, she will be referred for genetic testing.  I will defer to medical oncology regarding counseling of optimal forms of birth control during treatment.  __________________________________________   Eppie Gibson, MD  This document serves as a record of services personally performed by Eppie Gibson, MD. It was created on her behalf by Maryla Morrow, a trained medical scribe. The creation of this record is based on the scribe's personal observations and the provider's statements to them. This document has been checked and approved by the attending provider.

## 2016-10-22 NOTE — H&P (Signed)
Kelly Mendez 10/22/2016 7:51 AM Location: [facility] Patient #: [id number] DOB: 21-Oct-1975 Undefined / Language: Kelly Mendez / Race: Refused to Report/Unreported Female  History of Present Illness Kelly Moores A. 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  sHE HAS NO COMPLAINTS. pT DENIES PAIN MASS OR DISCHARGE.                CLINICAL DATA: Right breast slightly upper inner quadrant focal asymmetry seen on recent screening mammography.  EXAM: 2D DIGITAL DIAGNOSTIC RIGHT MAMMOGRAM WITH CAD AND ADJUNCT TOMO  ULTRASOUND RIGHT BREAST  COMPARISON: Previous exam(s).  ACR Breast Density Category b: There are scattered areas of fibroglandular density.  FINDINGS: Additional mammographic views of the right breast demonstrate persistent circumscribed mass in the slightly upper inner right breast, posterior depth, measuring 9 mm mammographically.  Mammographic images were processed with CAD.  On physical exam, there is a subtle palpable approximately 1 cm mass in the right breast upper inner quadrant, posterior depth.  Targeted ultrasound is performed, showing right breast 1 o'clock 18 cm from the nipple hypoechoic lobulated mass measuring 0.9 x 0.6 x 0.8 cm. Wide hyperechoic halo is seen surrounding the mass. Ultrasound evaluation of the right axilla demonstrates 1 indeterminate low right axillary lymph node with borderline thickening of its anterior cortex.  IMPRESSION: 9 mm right breast 1 o'clock mass, for which ultrasound-guided core needle biopsy is recommended.  1 indeterminate low right axillary lymph node, for which ultrasound-guided core needle biopsy is recommended.  RECOMMENDATION: Ultrasound-guided core needle biopsy of right breast and right axilla.  I have  discussed the findings and recommendations with the patient. Results were also provided in writing at the conclusion of the visit. If applicable, a reminder letter will be sent to the patient regarding the next appointment.  BI-RADS CATEGORY 4: Suspicious.   Electronically Signed By: Kelly Mendez M.D. On: 10/13/2016 15:13             ADDITIONAL INFORMATION: 1. PROGNOSTIC INDICATORS Results: IMMUNOHISTOCHEMICAL AND MORPHOMETRIC ANALYSIS PERFORMED MANUALLY Estrogen Receptor: 0%, NEGATIVE Progesterone Receptor: 0%, NEGATIVE Proliferation Marker Ki67: 80% COMMENT: The negative hormone receptor study(ies) in this case has No internal positive control. REFERENCE RANGE ESTROGEN RECEPTOR NEGATIVE 0% POSITIVE =>1% REFERENCE RANGE PROGESTERONE RECEPTOR NEGATIVE 0% POSITIVE =>1% All controls stained appropriately Kelly [name] Pathologist, Electronic Signature ( Signed 10/21/2016) 1. FLUORESCENCE IN-SITU HYBRIDIZATION Results: HER2 - NEGATIVE RATIO OF HER2/CEP17 SIGNALS 1.30 AVERAGE HER2 COPY NUMBER PER CELL 1.75 1 of 3 FINAL for Kelly Mendez, Kelly Mendez ([id number]) ADDITIONAL INFORMATION:(continued) Reference Range: NEGATIVE HER2/CEP17 Ratio <2.0 and average HER2 copy number <4.0 EQUIVOCAL HER2/CEP17 Ratio <2.0 and average HER2 copy number >=4.0 and <6.0 POSITIVE HER2/CEP17 Ratio >=2.0 or <2.0 and average HER2 copy number >=6.0 Kelly [name] Pathologist, Electronic Signature ( Signed 10/22/2016) FINAL DIAGNOSIS Diagnosis 1. Breast, right, needle core biopsy, 1:00 o'clock - INVASIVE DUCTAL CARCINOMA. - SEE COMMENT. 2. Lymph node, needle/core biopsy, right axilla - THERE IS NO EVIDENCE OF CARCINOMA IN 1 OF 1 LYMPH NODE.  The patient is a 41 year old female.   Past Surgical History Kelly Slipper, RN; 10/22/2016 7:52 AM) Breast Biopsy Right.  Diagnostic Studies History Kelly Slipper, RN; 10/22/2016 7:52 AM) Colonoscopy never Mammogram within last year Pap  Smear 1-5 years ago  Medication History Kelly Slipper, RN; 10/22/2016 7:52 AM) Medications  Reconciled  Social History Kelly Slipper, RN; 10/22/2016 7:52 AM) Alcohol use Moderate alcohol use. Caffeine use Carbonated beverages, Coffee, Tea. No drug use Tobacco use Current some day smoker.  Family History Kelly Slipper, RN; 10/22/2016 7:52 AM) Arthritis Father. Breast Cancer Mother. Depression Mother. Diabetes Mellitus Father. Hypertension Father. Thyroid problems Mother.  Pregnancy / Birth History Kelly Slipper, RN; 10/22/2016 7:52 AM) Age at menarche 63 years. Contraceptive History Depo-provera, Oral contraceptives. Gravida 3 Irregular periods Maternal age <15 Para 1  Other Problems Kelly Slipper, RN; 10/22/2016 7:52 AM) Anxiety Disorder Cervical Cancer Depression Diabetes Mellitus Hemorrhoids Lump In Breast Oophorectomy     Review of Systems Kelly Slipper RN; 10/22/2016 7:52 AM) General Present- Night Sweats. Not Present- Appetite Loss, Chills, Fatigue, Fever, Weight Gain and Weight Loss. Skin Not Present- Change in Wart/Mole, Dryness, Hives, Jaundice, New Lesions, Non-Healing Wounds, Rash and Ulcer. HEENT Not Present- Earache, Hearing Loss, Hoarseness, Nose Bleed, Oral Ulcers, Ringing in the Ears, Seasonal Allergies, Sinus Pain, Sore Throat, Visual Disturbances, Wears glasses/contact lenses and Yellow Eyes. Respiratory Not Present- Bloody sputum, Chronic Cough, Difficulty Breathing, Snoring and Wheezing. Breast Present- Breast Mass. Not Present- Breast Pain, Nipple Discharge and Skin Changes. Cardiovascular Not Present- Chest Pain, Difficulty Breathing Lying Down, Leg Cramps, Palpitations, Rapid Heart Rate, Shortness of Breath and Swelling of Extremities. Gastrointestinal Not Present- Abdominal Pain, Bloating, Bloody Stool, Change in Bowel Habits, Chronic diarrhea, Constipation, Difficulty Swallowing, Excessive gas, Gets full quickly at meals, Hemorrhoids,  Indigestion, Nausea, Rectal Pain and Vomiting. Female Genitourinary Not Present- Frequency, Nocturia, Painful Urination, Pelvic Pain and Urgency. Musculoskeletal Present- Joint Pain. Not Present- Back Pain, Joint Stiffness, Muscle Pain, Muscle Weakness and Swelling of Extremities. Neurological Not Present- Decreased Memory, Fainting, Headaches, Numbness, Seizures, Tingling, Tremor, Trouble walking and Weakness. Psychiatric Present- Anxiety and Change in Sleep Pattern. Not Present- Bipolar, Depression, Fearful and Frequent crying. Endocrine Present- New Diabetes. Not Present- Cold Intolerance, Excessive Hunger, Hair Changes, Heat Intolerance and Hot flashes. Hematology Not Present- Blood Thinners, Easy Bruising, Excessive bleeding, Gland problems, HIV and Persistent Infections.   Physical Exam (Kelly Franko A. Nadezhda Pollitt MD; 10/22/2016 2:54 PM)  General Mental Status-Alert. General Appearance-Consistent with stated age. Hydration-Well hydrated. Voice-Normal.  Head and Neck Head-normocephalic, atraumatic with no lesions or palpable masses. Trachea-midline. Thyroid Gland Characteristics - normal size and consistency.  Eye Eyeball - Bilateral-Extraocular movements intact. Sclera/Conjunctiva - Bilateral-No scleral icterus.  Chest and Lung Exam Chest and lung exam reveals -quiet, even and easy respiratory effort with no use of accessory muscles and on auscultation, normal breath sounds, no adventitious sounds and normal vocal resonance. Inspection Chest Wall - Normal. Back - normal.  Breast Breast - Left-Symmetric, Non Tender, No Biopsy scars, no Dimpling, No Inflammation, No Lumpectomy scars, No Mastectomy scars, No Peau d' Orange. Breast - Right-Symmetric, Non Tender, No Biopsy scars, no Dimpling, No Inflammation, No Lumpectomy scars, No Mastectomy scars, No Peau d' Orange. Breast Lump-No Palpable Breast Mass.  Cardiovascular Cardiovascular examination reveals  -normal heart sounds, regular rate and rhythm with no murmurs and normal pedal pulses bilaterally.  Abdomen Inspection Inspection of the abdomen reveals - No Hernias. Skin - Scar - no surgical scars. Palpation/Percussion Palpation and Percussion of the abdomen reveal - Soft, Non Tender, No Rebound tenderness, No Rigidity (guarding) and No hepatosplenomegaly. Auscultation Auscultation of the abdomen reveals - Bowel sounds normal.  Neurologic Neurologic evaluation reveals -alert and oriented x 3 with no impairment of recent or remote memory. Mental Status-Normal.  Musculoskeletal Normal Exam - Left-Upper Extremity Strength Normal  and Lower Extremity Strength Normal. Normal Exam - Right-Upper Extremity Strength Normal and Lower Extremity Strength Normal.  Lymphatic Head & Neck  General Head & Neck Lymphatics: Bilateral - Description - Normal. Axillary  General Axillary Region: Bilateral - Description - Normal. Tenderness - Non Tender. Femoral & Inguinal  Generalized Femoral & Inguinal Lymphatics: Bilateral - Description - Normal. Tenderness - Non Tender.    Assessment & Plan (Kelly Pontius A. Lawrence Roldan MD; 10/22/2016 2:55 PM)  BREAST CANCER, RIGHT (C50.911) Impression: pt getting genetics and desires neoadjuvant chemotherapy. She would like to conserve breast discussed port placement Pt requires port placement for chemotherapy. Risk include bleeding, infection, pneumothorax, hemothorax, mediastinal injury, nerve injury , blood vessel injury, strke, blood clots, death, migration. embolization and need for additional procedures. Pt agrees to proceed.  Current Plans You are being scheduled for surgery- Our schedulers will call you.  You should hear from our office's scheduling department within 5 working days about the location, date, and time of surgery. We try to make accommodations for patient's preferences in scheduling surgery, but sometimes the OR schedule or the  surgeon's schedule prevents [place] from making those accommodations.  If you have not heard from our office [phone]) in 5 working days, call the office and ask for your surgeon's nurse.  If you have other questions about your diagnosis, plan, or surgery, call the office and ask for your surgeon's nurse.  Pt Education - [facility] Breast Cancer Information Given - [facility] "Breast Journey" Package Use of a central venous catheter for intravenous therapy was discussed. Technique of catheter placement using ultrasound and fluoroscopy guidance was discussed. Risks such as bleeding, infection, pneumothorax, catheter occlusion, reoperation, and other risks were discussed. I noted a good likelihood this will help address the problem. Questions were answered. The patient expressed understanding & wishes to proceed.

## 2016-10-22 NOTE — Therapy (Signed)
[place], [name], [address] Phone: [phone]   Fax:  [phone]  Physical Therapy Evaluation  Patient Details  Name: Kelly Mendez MRN: [id number] Date of Birth: 1975-10-01 Referring Provider: Dr. [name]  Encounter Date: 10/22/2016      PT End of Session - 10/22/16 1550    Visit Number 1   Number of Visits 1   PT Start Time 9767   PT Stop Time 1502   PT Time Calculation (min) 27 min   Activity Tolerance Patient tolerated treatment well   Behavior During Therapy [facility] for tasks assessed/performed      Past Medical History:  Diagnosis Date  . Abnormal Pap smear of cervix   . Anxiety   . Asthma   . Cancer ([place])   . Depression   . Diabetes type 2, uncontrolled ([place])    new diagnosis in 08/2016  . HPV in female     Past Surgical History:  Procedure Laterality Date  . COLPOSCOPY    . left ovary removed  1978    There were no vitals filed for this visit.       Subjective Assessment - 10/22/16 1542    Subjective Patient reports she is here today to be seen by her medical team for her newly diagnosed right breast cancer.   Patient is accompained by: Family member   Pertinent History Patient was diagnosed on 10/06/16 with right Triple negative breast cancer. It measures 9 mm and is located in the upper inner quadrant. She was recently diagnosed with Type II diabetes.   Patient Stated Goals reduce lymphedema risk and learn post op shoulder ROM HEP   Currently in Pain? No/denies            [facility] PT Assessment - 10/22/16 0001      Assessment   Medical Diagnosis Right breast cancer   Referring Provider Dr. [name] [place]   Onset Date/Surgical Date 10/06/16   Hand Dominance Right   Prior Therapy none     Precautions   Precautions Other (comment)   Precaution Comments active cancer     Restrictions   Weight Bearing Restrictions No     Balance Screen   Has the patient  fallen in the past 6 months No   Has the patient had a decrease in activity level because of a fear of falling?  No   Is the patient reluctant to leave their home because of a fear of falling?  No     Home Environment   Living Environment Private residence   Living Arrangements Spouse/significant other;Children  Husband, 70, and 84  y.o. 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   Leisure She does not exercise     Cognition   Overall Cognitive Status Within Functional Limits for tasks assessed     Posture/Postural Control   Posture/Postural Control Postural limitations   Postural Limitations Rounded Shoulders;Forward head     ROM / Strength   AROM / PROM / Strength AROM;Strength     AROM   AROM Assessment Site Shoulder;Cervical   Right/Left Shoulder Right;Left   Right Shoulder Extension 45 Degrees   Right Shoulder Flexion 151 Degrees   Right Shoulder ABduction 159 Degrees   Right Shoulder Internal Rotation  82 Degrees   Right Shoulder External Rotation 86 Degrees   Left Shoulder Extension 43 Degrees   Left Shoulder Flexion 152 Degrees   Left Shoulder ABduction 158 Degrees   Left Shoulder Internal Rotation 72 Degrees   Left Shoulder External Rotation 76 Degrees   Cervical Flexion WNL   Cervical Extension WNL   Cervical - Right Side Bend WNL   Cervical - Left Side Bend WNL   Cervical - Right Rotation WNL   Cervical - Left Rotation WNL     Strength   Overall Strength Within functional limits for tasks performed           LYMPHEDEMA/ONCOLOGY QUESTIONNAIRE - 10/22/16 1548      Type   Cancer Type Right breast     Lymphedema Assessments   Lymphedema Assessments Upper extremities     Right Upper Extremity Lymphedema   10 cm Proximal to Olecranon Process 45.1 cm    Olecranon Process 30.4 cm   10 cm Proximal to Ulnar Styloid Process 25.6 cm   Just Proximal to Ulnar Styloid Process 17.4 cm   Across Hand at [employer] 19.8 cm   At [place] of 2nd Digit 6.6 cm     Left Upper Extremity Lymphedema   10 cm Proximal to Olecranon Process 43 cm   Olecranon Process 30.6 cm   10 cm Proximal to Ulnar Styloid Process 25.5 cm   Just Proximal to Ulnar Styloid Process 18 cm   Across Hand at [employer] 19.9 cm   At [place] of 2nd Digit 6.6 cm        Patient was instructed today in a home exercise program today for post op shoulder range of motion. These included active assist shoulder flexion in sitting, scapular retraction, wall walking with shoulder abduction, and hands behind head external rotation.  She was encouraged to do these twice a day, holding 3 seconds and repeating 5 times when permitted by her physician.           PT Education - 10/22/16 1550    Education provided Yes   Education Details Lymphedema risk reduction and post op shoulder ROM HEP   Person(s) Educated Patient;Parent(s)   Methods Explanation;Demonstration;Handout   Comprehension Returned demonstration;Verbalized understanding              Breast Clinic Goals - 10/22/16 1601      Patient will be able to verbalize understanding of pertinent lymphedema risk reduction practices relevant to her diagnosis specifically related to skin care.   Time 1   Period Days   Status Achieved     Patient will be able to return demonstrate and/or verbalize understanding of the post-op home exercise program related to regaining shoulder range of motion.   Time 1   Period Days   Status Achieved     Patient will be able to verbalize understanding of the importance of attending the postoperative After Breast Cancer Class for further lymphedema risk reduction education and therapeutic exercise.   Time 1   Period Days   Status Achieved              Plan - 10/22/16 1551     Clinical Impression Statement Patient was diagnosed on 10/06/16 with right Triple negative breast cancer. It measures 9 mm and is located in the upper inner quadrant. She was recently diagnosed with Type II diabetes. Her multidisciplinary medical team met prior to her assessments to determine a recommended treatment plan. She is planning to  have neoadjuvant chemotherapy, a right lumpecotmy and sentinel node biopsy, and radiation. She may benefit from post op PT to regain shoulder ROM and reduce lymphedema risk. Due to her lack of comorbidities that would impact rehab, her eval is of low complexity.   Rehab Potential Excellent   Clinical Impairments Affecting Rehab Potential None   PT Frequency One time visit   PT Treatment/Interventions Patient/family education;Therapeutic exercise   PT Next Visit Plan Will f/u after surgery to determine PT needs   PT Home Exercise Plan Post op shoulder ROM HEP   Consulted and Agree with Plan of Care Patient;Family member/caregiver   Family Member Consulted Mother      Patient will benefit from skilled therapeutic intervention in order to improve the following deficits and impairments:  Postural dysfunction, Decreased knowledge of precautions, Pain, Impaired UE functional use, Decreased range of motion  Visit Diagnosis: Carcinoma of upper-inner quadrant of right breast in female, estrogen receptor negative ([place]) - Plan: PT plan of care cert/re-cert  Abnormal posture - Plan: PT plan of care cert/re-cert   Patient will follow up at outpatient cancer rehab if needed following surgery.  If the patient requires physical therapy at that time, a specific plan will be dictated and sent to the referring physician for approval. The patient was educated today on appropriate basic range of motion exercises to begin post operatively and the importance of attending the After Breast Cancer class following surgery.  Patient was educated today on lymphedema risk reduction  practices as it pertains to recommendations that will benefit the patient immediately following surgery.  She verbalized good understanding.  No additional physical therapy is indicated at this time.      Problem List Patient Active Problem List   Diagnosis Date Noted  . Malignant neoplasm of upper-inner quadrant of right breast in female, estrogen receptor negative ([place]) 10/21/2016  . Low HDL (under 40) 09/16/2016  . ASCVD (arteriosclerotic cardiovascular disease) 09/16/2016  . Light cigarette smoker 09/15/2016  . New onset type 2 diabetes mellitus ([place]) 09/15/2016  . Morbid obesity ([place]) 09/15/2016  . Asthma 09/15/2016  . Trichomoniasis 09/15/2016    Annia Friendly, PT 10/22/16 4:03 PM  [place], [name], [address] Phone: [phone]   Fax:  [phone]  Name: Kelly Mendez MRN: [id number] Date of Birth: 08/03/1975

## 2016-10-22 NOTE — Telephone Encounter (Signed)
Appts already scheduled per 4/11 los.

## 2016-10-23 ENCOUNTER — Other Ambulatory Visit: Payer: Managed Care, Other (non HMO)

## 2016-10-23 ENCOUNTER — Encounter: Payer: Managed Care, Other (non HMO) | Admitting: Genetics

## 2016-10-24 ENCOUNTER — Encounter: Payer: Self-pay | Admitting: General Practice

## 2016-10-24 NOTE — Progress Notes (Signed)
Short Psychosocial Distress Screening [place] by phone following Breast Multidisciplinary Clinic to introduce [place] team/resources, reviewing distress screen per protocol.  The patient scored a 4 on the Psychosocial Distress Thermometer which indicates moderate distress. Also assessed for distress and other psychosocial needs.   ONCBCN DISTRESS SCREENING 10/24/2016  Screening Type Initial Screening  Distress experienced in past week (1-10) 4  Practical problem type Transportation  Emotional problem type Nervousness/Anxiety  Physical Problem type Sleep/insomnia  Referral to support programs Yes   Ramiah met with another breast cancer survivor (via personal/family connection) this afternoon, finding support and encouragement in their conversation. She has full packet of [place] and plans to stop by [place] after her appointment on Monday to explore resources further.  Follow up needed: No. Please page if additional needs arise. Thank you.   [place], [place], [facility] Pager [phone] Voicemail [phone]

## 2016-10-27 ENCOUNTER — Other Ambulatory Visit: Payer: Managed Care, Other (non HMO)

## 2016-10-27 ENCOUNTER — Ambulatory Visit (INDEPENDENT_AMBULATORY_CARE_PROVIDER_SITE_OTHER): Payer: Managed Care, Other (non HMO) | Admitting: Family Medicine

## 2016-10-27 ENCOUNTER — Encounter: Payer: Self-pay | Admitting: Family Medicine

## 2016-10-27 ENCOUNTER — Telehealth: Payer: Self-pay

## 2016-10-27 ENCOUNTER — Telehealth: Payer: Self-pay | Admitting: *Deleted

## 2016-10-27 VITALS — BP 114/74 | HR 83 | Wt 353.0 lb

## 2016-10-27 DIAGNOSIS — E1165 Type 2 diabetes mellitus with hyperglycemia: Secondary | ICD-10-CM | POA: Diagnosis not present

## 2016-10-27 DIAGNOSIS — E118 Type 2 diabetes mellitus with unspecified complications: Secondary | ICD-10-CM

## 2016-10-27 DIAGNOSIS — IMO0002 Reserved for concepts with insufficient information to code with codable children: Secondary | ICD-10-CM

## 2016-10-27 DIAGNOSIS — E1142 Type 2 diabetes mellitus with diabetic polyneuropathy: Secondary | ICD-10-CM | POA: Insufficient documentation

## 2016-10-27 NOTE — Telephone Encounter (Signed)
  Oncology Nurse Navigator Documentation  Navigator Location: [facility] (10/27/16 1400)   )Navigator Encounter Type: Telephone (10/27/16 1400) Telephone: Outgoing Call;Clinic/MDC Follow-up (10/27/16 1400)                                                  Time Spent with Patient: 15 (10/27/16 1400)    

## 2016-10-27 NOTE — Progress Notes (Signed)
   Subjective:    Patient ID: Kelly Mendez, female    DOB: October 19, 1975, 41 y.o.   MRN: [id number]  HPI Chief Complaint  Patient presents with  . 4 week follow-up    4 week follow-up. BS seem to spike at night. usually morning BS is >165-185. this morning was 160   She is here for follow up regarding new diagnosis of DM2 in early March.  She was started on metformin 1,000mg  twice daily and atorvastatin.  She went to see the nutritionist. Is eating healthier.  Last Hgb A1c 8.8% on March 5th.   She has cut back to 2-3 cigarettes per day.   Since her last visit she was found to have breast cancer and has started treatment for this. Is going for a port insertion this week and starts chemo on the 25th. She is doing genetic testing and will decide then about full scope of treatment.   No other concerns or complaints today.   Blood sugars: fasting 140-170 and one reading of 180 2 hours post meals range 140-160.   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 No problems since 2012.   Depo-provera injections for contraception but she does not want to have another. Husband has had a vasectomy.   Reviewed allergies, medications, past medical, surgical,  and social history.   Review of Systems Pertinent positives and negatives in the history of present illness.     Objective:   Physical Exam BP 114/74   Pulse 83   Wt (!) 353 lb (160.1 kg)   LMP 10/06/2016   BMI 45.32 kg/m   Alert and oriented and in no acute distress. Not otherwise examined.       Assessment & Plan:  Uncontrolled type 2 diabetes mellitus with complication, without long-term current use of insulin ([facility])  Her blood sugars are closer to normal range. No problems with medications. We will continue on current medications and see how she does over the next 2 months. Discussed that if she notices blood sugars going back up especially with the new breast cancer diagnosis and upcoming stress with treatment to let  me know.  She appears to be doing better with her diet.  Discussed that she is only smoking 2-3 cigarettes per day and I recommend she stop smoking completely.  Will follow up in 2 months.

## 2016-10-27 NOTE — Patient Instructions (Signed)
Goal for fasting blood sugars are 80-130 and 2 hours after eating 130-180.

## 2016-10-27 NOTE — Telephone Encounter (Signed)
Anderson [place] with [employer] called that MRI breast bilateral has been approved. PA# [id number]. Date range 10-23-16 to 01-21-17.

## 2016-10-28 ENCOUNTER — Encounter (HOSPITAL_COMMUNITY): Payer: Self-pay | Admitting: *Deleted

## 2016-10-28 ENCOUNTER — Encounter: Payer: Self-pay | Admitting: Oncology

## 2016-10-28 NOTE — Progress Notes (Signed)
Called pt to introduce myself as her Arboriculturist and to discuss copay assistance.  She gave me consent to apply in her behalf so I applied to the [employer] foundation. She was approved for $5,000 for 12 months from 10/28/16; $1,650 is her standard award, $3,350 is accessible on a [employer] basis as long as funding remains available. Emailed copies of approval letter and POE to Belinda Fisher and Ricardo in billing and to HIM to scan in pt's chart.  I will also enroll pt in the [place] First Step program on 10/31/16.  She exceeds the income requirement for the [employer].

## 2016-10-29 ENCOUNTER — Ambulatory Visit (HOSPITAL_COMMUNITY): Payer: Managed Care, Other (non HMO)

## 2016-10-29 ENCOUNTER — Ambulatory Visit (HOSPITAL_COMMUNITY): Payer: Managed Care, Other (non HMO) | Admitting: Certified Registered Nurse Anesthetist

## 2016-10-29 ENCOUNTER — Encounter (HOSPITAL_COMMUNITY)
Admission: RE | Disposition: A | Discharge status: Home / Self Care | Payer: Self-pay | Source: Ambulatory Visit | Attending: Surgery

## 2016-10-29 ENCOUNTER — Ambulatory Visit (HOSPITAL_COMMUNITY)
Admission: RE | Admit: 2016-10-29 | Discharge: 2016-10-29 | Disposition: A | Discharge status: Home / Self Care | Payer: Managed Care, Other (non HMO) | Source: Ambulatory Visit | Attending: Surgery | Admitting: Surgery

## 2016-10-29 ENCOUNTER — Ambulatory Visit
Admission: RE | Admit: 2016-10-29 | Discharge: 2016-10-29 | Disposition: A | Discharge status: Home / Self Care | Payer: Managed Care, Other (non HMO) | Source: Ambulatory Visit | Attending: Oncology | Admitting: Oncology

## 2016-10-29 ENCOUNTER — Encounter (HOSPITAL_COMMUNITY): Payer: Self-pay | Admitting: Certified Registered Nurse Anesthetist

## 2016-10-29 DIAGNOSIS — F329 Major depressive disorder, single episode, unspecified: Secondary | ICD-10-CM | POA: Insufficient documentation

## 2016-10-29 DIAGNOSIS — F419 Anxiety disorder, unspecified: Secondary | ICD-10-CM | POA: Diagnosis not present

## 2016-10-29 DIAGNOSIS — Z803 Family history of malignant neoplasm of breast: Secondary | ICD-10-CM | POA: Diagnosis not present

## 2016-10-29 DIAGNOSIS — C50211 Malignant neoplasm of upper-inner quadrant of right female breast: Secondary | ICD-10-CM

## 2016-10-29 DIAGNOSIS — J45909 Unspecified asthma, uncomplicated: Secondary | ICD-10-CM | POA: Diagnosis not present

## 2016-10-29 DIAGNOSIS — F17200 Nicotine dependence, unspecified, uncomplicated: Secondary | ICD-10-CM | POA: Diagnosis not present

## 2016-10-29 DIAGNOSIS — Z171 Estrogen receptor negative status [ER-]: Secondary | ICD-10-CM

## 2016-10-29 DIAGNOSIS — Z6841 Body Mass Index (BMI) 40.0 and over, adult: Secondary | ICD-10-CM | POA: Diagnosis not present

## 2016-10-29 DIAGNOSIS — Z8541 Personal history of malignant neoplasm of cervix uteri: Secondary | ICD-10-CM | POA: Diagnosis not present

## 2016-10-29 DIAGNOSIS — Z419 Encounter for procedure for purposes other than remedying health state, unspecified: Secondary | ICD-10-CM

## 2016-10-29 DIAGNOSIS — E119 Type 2 diabetes mellitus without complications: Secondary | ICD-10-CM | POA: Insufficient documentation

## 2016-10-29 DIAGNOSIS — Z7984 Long term (current) use of oral hypoglycemic drugs: Secondary | ICD-10-CM | POA: Insufficient documentation

## 2016-10-29 DIAGNOSIS — Z79899 Other long term (current) drug therapy: Secondary | ICD-10-CM | POA: Insufficient documentation

## 2016-10-29 DIAGNOSIS — Z95828 Presence of other vascular implants and grafts: Secondary | ICD-10-CM

## 2016-10-29 HISTORY — PX: PORTACATH PLACEMENT: SHX2246

## 2016-10-29 HISTORY — DX: Concussion with loss of consciousness status unknown, initial encounter: S06.0XAA

## 2016-10-29 HISTORY — DX: Post-traumatic stress disorder, unspecified: F43.10

## 2016-10-29 HISTORY — DX: Concussion with loss of consciousness of unspecified duration, initial encounter: S06.0X9A

## 2016-10-29 HISTORY — DX: Hyperlipidemia, unspecified: E78.5

## 2016-10-29 LAB — GLUCOSE, CAPILLARY
Glucose-Capillary: 162 mg/dL — ABNORMAL HIGH (ref 65–99)
Glucose-Capillary: 135 mg/dL — ABNORMAL HIGH (ref 65–99)

## 2016-10-29 LAB — CBC WITH DIFFERENTIAL/PLATELET
Basophils Absolute: 0 10*3/uL (ref 0.0–0.1)
Basophils Relative: 0 %
Eosinophils Absolute: 0.1 10*3/uL (ref 0.0–0.7)
Eosinophils Relative: 2 %
HCT: 38.5 % (ref 36.0–46.0)
Hemoglobin: 12.4 g/dL (ref 12.0–15.0)
Lymphocytes Relative: 29 %
Lymphs Abs: 2.7 10*3/uL (ref 0.7–4.0)
MCH: 24.8 pg — ABNORMAL LOW (ref 26.0–34.0)
MCHC: 32.2 g/dL (ref 30.0–36.0)
MCV: 76.8 fL — ABNORMAL LOW (ref 78.0–100.0)
Monocytes Absolute: 0.4 10*3/uL (ref 0.1–1.0)
Monocytes Relative: 4 %
Neutro Abs: 6.1 10*3/uL (ref 1.7–7.7)
Neutrophils Relative %: 65 %
Platelets: 272 10*3/uL (ref 150–400)
RBC: 5.01 MIL/uL (ref 3.87–5.11)
RDW: 15.2 % (ref 11.5–15.5)
WBC: 9.3 10*3/uL (ref 4.0–10.5)

## 2016-10-29 LAB — COMPREHENSIVE METABOLIC PANEL
ALT: 17 U/L (ref 14–54)
AST: 15 U/L (ref 15–41)
Albumin: 3.5 g/dL (ref 3.5–5.0)
Alkaline Phosphatase: 73 U/L (ref 38–126)
Anion gap: 9 (ref 5–15)
BUN: 13 mg/dL (ref 6–20)
CO2: 22 mmol/L (ref 22–32)
Calcium: 9.4 mg/dL (ref 8.9–10.3)
Chloride: 105 mmol/L (ref 101–111)
Creatinine, Ser: 0.77 mg/dL (ref 0.44–1.00)
GFR calc Af Amer: >60 mL/min (ref >60)
GFR calc non Af Amer: >60 mL/min (ref >60)
Glucose, Bld: 158 mg/dL — ABNORMAL HIGH (ref 65–99)
Potassium: 3.8 mmol/L (ref 3.5–5.1)
Sodium: 136 mmol/L (ref 135–145)
Total Bilirubin: 0.3 mg/dL (ref 0.3–1.2)
Total Protein: 6.2 g/dL — ABNORMAL LOW (ref 6.5–8.1)

## 2016-10-29 LAB — HCG, SERUM, QUALITATIVE: Preg, Serum: NEGATIVE

## 2016-10-29 SURGERY — INSERTION, TUNNELED CENTRAL VENOUS DEVICE, WITH PORT
Anesthesia: General | Site: Chest | Laterality: Right

## 2016-10-29 MED ORDER — LIDOCAINE 2% (20 MG/ML) 5 ML SYRINGE
INTRAMUSCULAR | Status: AC
  Filled 2016-10-29: qty 5

## 2016-10-29 MED ORDER — CHLORHEXIDINE GLUCONATE CLOTH 2 % EX PADS: 6.0000 | MEDICATED_PAD | Freq: Once | CUTANEOUS | Status: DC

## 2016-10-29 MED ORDER — MEPERIDINE HCL 25 MG/ML IJ SOLN: 6.2500 mg | INTRAMUSCULAR | Status: DC | PRN

## 2016-10-29 MED ORDER — FENTANYL CITRATE (PF) 250 MCG/5ML IJ SOLN
INTRAMUSCULAR | Status: AC
  Filled 2016-10-29: qty 5

## 2016-10-29 MED ORDER — ONDANSETRON HCL 4 MG/2ML IJ SOLN
INTRAMUSCULAR | Status: AC
  Filled 2016-10-29: qty 2

## 2016-10-29 MED ORDER — CLINDAMYCIN PHOSPHATE 600 MG/50ML IV SOLN
600.0000 mg | Freq: Once | INTRAVENOUS | Status: AC
  Administered 2016-10-29: 600 mg via INTRAVENOUS
  Filled 2016-10-29: qty 50

## 2016-10-29 MED ORDER — DIPHENHYDRAMINE HCL 50 MG/ML IJ SOLN
INTRAMUSCULAR | Status: AC
  Filled 2016-10-29: qty 1

## 2016-10-29 MED ORDER — MIDAZOLAM HCL 5 MG/5ML IJ SOLN
INTRAMUSCULAR | Status: DC | PRN
  Administered 2016-10-29 (×2): 1 mg via INTRAVENOUS

## 2016-10-29 MED ORDER — OXYCODONE HCL 5 MG PO TABS
5.0000 mg | ORAL_TABLET | Freq: Once | ORAL | Status: AC
  Administered 2016-10-29: 5 mg via ORAL

## 2016-10-29 MED ORDER — OXYCODONE HCL 5 MG PO TABS: 5.0000 mg | ORAL_TABLET | Freq: Four times a day (QID) | ORAL | 0 refills | Status: DC | PRN

## 2016-10-29 MED ORDER — GADOBENATE DIMEGLUMINE 529 MG/ML IV SOLN
19.0000 mL | Freq: Once | INTRAVENOUS | Status: AC | PRN
  Administered 2016-10-29: 19 mL via INTRAVENOUS

## 2016-10-29 MED ORDER — SODIUM CHLORIDE 0.9 % IV SOLN
INTRAVENOUS | Status: DC | PRN
  Administered 2016-10-29: 09:00:00 500 mL

## 2016-10-29 MED ORDER — LIDOCAINE HCL (CARDIAC) 20 MG/ML IV SOLN
INTRAVENOUS | Status: DC | PRN
  Administered 2016-10-29: 40 mg via INTRAVENOUS

## 2016-10-29 MED ORDER — DIPHENHYDRAMINE HCL 50 MG/ML IJ SOLN
INTRAMUSCULAR | Status: DC | PRN
  Administered 2016-10-29: 12.5 mg via INTRAVENOUS

## 2016-10-29 MED ORDER — ONDANSETRON HCL 4 MG/2ML IJ SOLN
INTRAMUSCULAR | Status: DC | PRN
  Administered 2016-10-29: 4 mg via INTRAVENOUS

## 2016-10-29 MED ORDER — HEPARIN SOD (PORK) LOCK FLUSH 100 UNIT/ML IV SOLN
INTRAVENOUS | Status: DC | PRN
  Administered 2016-10-29: 500 [IU] via INTRAVENOUS

## 2016-10-29 MED ORDER — HEPARIN SOD (PORK) LOCK FLUSH 100 UNIT/ML IV SOLN
INTRAVENOUS | Status: AC
  Filled 2016-10-29: qty 5

## 2016-10-29 MED ORDER — MIDAZOLAM HCL 2 MG/2ML IJ SOLN: 0.5000 mg | Freq: Once | INTRAMUSCULAR | Status: DC | PRN

## 2016-10-29 MED ORDER — PROPOFOL 10 MG/ML IV BOLUS
INTRAVENOUS | Status: DC | PRN
  Administered 2016-10-29: 200 mg via INTRAVENOUS

## 2016-10-29 MED ORDER — BUPIVACAINE HCL (PF) 0.25 % IJ SOLN
INTRAMUSCULAR | Status: DC | PRN
  Administered 2016-10-29: 10 mL

## 2016-10-29 MED ORDER — FENTANYL CITRATE (PF) 100 MCG/2ML IJ SOLN
INTRAMUSCULAR | Status: AC
  Filled 2016-10-29: qty 2

## 2016-10-29 MED ORDER — 0.9 % SODIUM CHLORIDE (POUR BTL) OPTIME
TOPICAL | Status: DC | PRN
  Administered 2016-10-29: 1000 mL

## 2016-10-29 MED ORDER — BUPIVACAINE HCL (PF) 0.25 % IJ SOLN
INTRAMUSCULAR | Status: AC
  Filled 2016-10-29: qty 30

## 2016-10-29 MED ORDER — MIDAZOLAM HCL 2 MG/2ML IJ SOLN
INTRAMUSCULAR | Status: AC
  Filled 2016-10-29: qty 2

## 2016-10-29 MED ORDER — DEXAMETHASONE SODIUM PHOSPHATE 10 MG/ML IJ SOLN
INTRAMUSCULAR | Status: DC | PRN
  Administered 2016-10-29: 5 mg via INTRAVENOUS

## 2016-10-29 MED ORDER — PROPOFOL 10 MG/ML IV BOLUS
INTRAVENOUS | Status: AC
  Filled 2016-10-29: qty 40

## 2016-10-29 MED ORDER — FENTANYL CITRATE (PF) 100 MCG/2ML IJ SOLN
INTRAMUSCULAR | Status: DC | PRN
  Administered 2016-10-29 (×3): 25 ug via INTRAVENOUS

## 2016-10-29 MED ORDER — LACTATED RINGERS IV SOLN
INTRAVENOUS | Status: DC | PRN
  Administered 2016-10-29: 08:00:00 via INTRAVENOUS

## 2016-10-29 MED ORDER — OXYCODONE HCL 5 MG PO TABS
ORAL_TABLET | ORAL | Status: AC
  Filled 2016-10-29: qty 1

## 2016-10-29 MED ORDER — FENTANYL CITRATE (PF) 100 MCG/2ML IJ SOLN
25.0000 ug | INTRAMUSCULAR | Status: DC | PRN
  Administered 2016-10-29: 50 ug via INTRAVENOUS

## 2016-10-29 MED ORDER — PROMETHAZINE HCL 25 MG/ML IJ SOLN: 6.2500 mg | INTRAMUSCULAR | Status: DC | PRN

## 2016-10-29 MED ORDER — DEXAMETHASONE SODIUM PHOSPHATE 10 MG/ML IJ SOLN
INTRAMUSCULAR | Status: AC
  Filled 2016-10-29: qty 1

## 2016-10-29 SURGICAL SUPPLY — 49 items
ADH SKN CLS APL DERMABOND .7 (GAUZE/BANDAGES/DRESSINGS) ×1
BAG DECANTER FOR FLEXI CONT (MISCELLANEOUS) ×3 IMPLANT
BLADE SURG 11 STRL SS (BLADE) ×3 IMPLANT
BLADE SURG 15 STRL LF DISP TIS (BLADE) ×1 IMPLANT
BLADE SURG 15 STRL SS (BLADE) ×3
CHLORAPREP W/TINT 26ML (MISCELLANEOUS) ×3 IMPLANT
COVER MAYO STAND STRL (DRAPES) ×3 IMPLANT
COVER SURGICAL LIGHT HANDLE (MISCELLANEOUS) ×3 IMPLANT
COVER TRANSDUCER ULTRASND GEL (DRAPE) ×3 IMPLANT
CRADLE DONUT ADULT HEAD (MISCELLANEOUS) ×3 IMPLANT
DERMABOND ADVANCED (GAUZE/BANDAGES/DRESSINGS) ×2
DERMABOND ADVANCED .7 DNX12 (GAUZE/BANDAGES/DRESSINGS) ×1 IMPLANT
DRAPE C-ARM 42X72 X-RAY (DRAPES) ×3 IMPLANT
DRAPE LAPAROSCOPIC ABDOMINAL (DRAPES) ×3 IMPLANT
DRAPE UTILITY XL STRL (DRAPES) ×3 IMPLANT
ELECT CAUTERY BLADE 6.4 (BLADE) ×3 IMPLANT
ELECT REM PT RETURN 9FT ADLT (ELECTROSURGICAL) ×3
ELECTRODE REM PT RTRN 9FT ADLT (ELECTROSURGICAL) ×1 IMPLANT
GAUZE SPONGE 4X4 16PLY XRAY LF (GAUZE/BANDAGES/DRESSINGS) ×3 IMPLANT
GEL ULTRASOUND 20GR AQUASONIC (MISCELLANEOUS) ×2 IMPLANT
GLOVE BIO SURGEON STRL SZ8 (GLOVE) ×3 IMPLANT
GLOVE BIOGEL PI IND STRL 8 (GLOVE) ×1 IMPLANT
GLOVE BIOGEL PI INDICATOR 8 (GLOVE) ×2
GOWN STRL REUS W/ TWL LRG LVL3 (GOWN DISPOSABLE) ×1 IMPLANT
GOWN STRL REUS W/ TWL XL LVL3 (GOWN DISPOSABLE) ×1 IMPLANT
GOWN STRL REUS W/TWL LRG LVL3 (GOWN DISPOSABLE) ×3
GOWN STRL REUS W/TWL XL LVL3 (GOWN DISPOSABLE) ×3
INTRODUCER COOK 11FR (CATHETERS) IMPLANT
KIT BASIN OR (CUSTOM PROCEDURE TRAY) ×3 IMPLANT
KIT PORT POWER 8FR ISP CVUE (Catheter) ×2 IMPLANT
KIT ROOM TURNOVER OR (KITS) ×3 IMPLANT
NDL HYPO 25GX1X1/2 BEV (NEEDLE) ×1 IMPLANT
NEEDLE HYPO 25GX1X1/2 BEV (NEEDLE) ×3 IMPLANT
NS IRRIG 1000ML POUR BTL (IV SOLUTION) ×3 IMPLANT
PACK SURGICAL SETUP 50X90 (CUSTOM PROCEDURE TRAY) ×3 IMPLANT
PAD ARMBOARD 7.5X6 YLW CONV (MISCELLANEOUS) ×3 IMPLANT
PENCIL BUTTON HOLSTER BLD 10FT (ELECTRODE) ×3 IMPLANT
SET INTRODUCER 12FR PACEMAKER (SHEATH) IMPLANT
SET SHEATH INTRODUCER 10FR (MISCELLANEOUS) IMPLANT
SHEATH COOK PEEL AWAY SET 9F (SHEATH) IMPLANT
SUT MNCRL AB 4-0 PS2 18 (SUTURE) ×3 IMPLANT
SUT PROLENE 2 0 SH 30 (SUTURE) ×3 IMPLANT
SUT VIC AB 3-0 SH 27 (SUTURE) ×3
SUT VIC AB 3-0 SH 27X BRD (SUTURE) ×1 IMPLANT
SYR 20ML ECCENTRIC (SYRINGE) ×6 IMPLANT
SYR 5ML LUER SLIP (SYRINGE) ×3 IMPLANT
SYR CONTROL 10ML LL (SYRINGE) ×3 IMPLANT
TOWEL OR 17X24 6PK STRL BLUE (TOWEL DISPOSABLE) ×3 IMPLANT
TOWEL OR 17X26 10 PK STRL BLUE (TOWEL DISPOSABLE) ×1 IMPLANT

## 2016-10-29 NOTE — Anesthesia Postprocedure Evaluation (Signed)
Anesthesia Post Note  Patient: Kandice Hams  Procedure(s) Performed: Procedure(s) (LRB): INSERTION PORT-A-CATH WITH [place] (Right)  Patient location during evaluation: PACU Anesthesia Type: General Level of consciousness: awake and alert, oriented and patient cooperative Pain management: pain level controlled Vital Signs Assessment: post-procedure vital signs reviewed and stable Respiratory status: spontaneous breathing, nonlabored ventilation and respiratory function stable Cardiovascular status: blood pressure returned to baseline and stable Postop Assessment: no signs of nausea or vomiting Anesthetic complications: no       Last Vitals:  Vitals:   10/29/16 1030 10/29/16 1050  BP: 135/82 (!) 142/95  Pulse: 75 81  Resp: 18 16  Temp: 36.8 C     Last Pain:  Vitals:   10/29/16 1050  TempSrc:   PainSc: 2                  Azia Toutant,E. Kazumi Lachney

## 2016-10-29 NOTE — Anesthesia Procedure Notes (Signed)
Procedure Name: LMA Insertion Date/Time: 10/29/2016 8:39 AM Performed by: Candis Shine Pre-anesthesia Checklist: Patient identified, Emergency Drugs available, Suction available and Patient being monitored Patient Re-evaluated:Patient Re-evaluated prior to inductionOxygen Delivery Method: Circle System Utilized Preoxygenation: Pre-oxygenation with 100% oxygen Intubation Type: IV induction Ventilation: Mask ventilation without difficulty LMA: LMA inserted LMA Size: 5.0 Number of attempts: 1 Airway Equipment and Method: Bite block Placement Confirmation: positive ETCO2 Tube secured with: Tape Dental Injury: Teeth and Oropharynx as per pre-operative assessment

## 2016-10-29 NOTE — Anesthesia Preprocedure Evaluation (Signed)
Anesthesia Evaluation  Patient identified by MRN, date of birth, ID band Patient awake    Reviewed: Allergy & Precautions, NPO status , Patient's Chart, lab work & pertinent test results  History of Anesthesia Complications Negative for: history of anesthetic complications  Airway Mallampati: I  TM Distance: >3 FB Neck ROM: Full    Dental  (+) Dental Advisory Given   Pulmonary asthma (no inhaler in over a year) , Current Smoker,    breath sounds clear to auscultation       Cardiovascular negative cardio ROS   Rhythm:Regular Rate:Normal     Neuro/Psych Anxiety Depression negative neurological ROS     GI/Hepatic negative GI ROS, Neg liver ROS,   Endo/Other  diabetes (glu 162), Oral Hypoglycemic AgentsMorbid obesity  Renal/GU negative Renal ROS     Musculoskeletal   Abdominal (+) + obese,   Peds  Hematology   Anesthesia Other Findings   Reproductive/Obstetrics                             Anesthesia Physical Anesthesia Plan  ASA: III  Anesthesia Plan: General   Post-op Pain Management:    Induction: Intravenous  Airway Management Planned: LMA  Additional Equipment:   Intra-op Plan:   Post-operative Plan:   Informed Consent: I have reviewed the patients History and Physical, chart, labs and discussed the procedure including the risks, benefits and alternatives for the proposed anesthesia with the patient or authorized representative who has indicated his/her understanding and acceptance.   Dental advisory given  Plan Discussed with: CRNA and Surgeon  Anesthesia Plan Comments: (Plan routine monitors, GA- LMA OK)        Anesthesia Quick Evaluation

## 2016-10-29 NOTE — Discharge Instructions (Signed)
    PORT-A-CATH: POST OP INSTRUCTIONS  Always review your discharge instruction sheet given to you by the facility where your surgery was performed.   1. A prescription for pain medication may be given to you upon discharge. Take your pain medication as prescribed, if needed. If narcotic pain medicine is not needed, then you make take acetaminophen (Tylenol) or ibuprofen (Advil) as needed.  2. Take your usually prescribed medications unless otherwise directed. 3. If you need a refill on your pain medication, please contact our office. All narcotic pain medicine now requires a paper prescription.  Phoned in and fax refills are no longer allowed by law.  Prescriptions will not be filled after 5 pm or on weekends.  4. You should follow a light diet for the remainder of the day after your procedure. 5. Most patients will experience some mild swelling and/or bruising in the area of the incision. It may take several days to resolve. 6. It is common to experience some constipation if taking pain medication after surgery. Increasing fluid intake and taking a stool softener (such as Colace) will usually help or prevent this problem from occurring. A mild laxative (Milk of Magnesia or Miralax) should be taken according to package directions if there are no bowel movements after 48 hours.  7. Unless discharge instructions indicate otherwise, you may remove your bandages 48 hours after surgery, and you may shower at that time. You may have steri-strips (Mullenax white skin tapes) in place directly over the incision.  These strips should be left on the skin for 7-10 days.  If your surgeon used Dermabond (skin glue) on the incision, you may shower in 24 hours.  The glue will flake off over the next 2-3 weeks.  8. If your port is left accessed at the end of surgery (needle left in port), the dressing cannot get wet and should only by changed by a healthcare professional. When the port is no longer accessed (when the  needle has been removed), follow step 7.   9. ACTIVITIES:  Limit activity involving your arms for the next 72 hours. Do no strenuous exercise or activity for 1 week. You may drive when you are no longer taking prescription pain medication, you can comfortably wear a seatbelt, and you can maneuver your car. 10.You may need to see your doctor in the office for a follow-up appointment.  Please       check with your doctor.  11.When you receive a new Port-a-Cath, you will get a product guide and        ID card.  Please keep them in case you need them.  WHEN TO CALL YOUR DOCTOR ([phone]): 1. Fever over 101.0 2. Chills 3. Continued bleeding from incision 4. Increased redness and tenderness at the site 5. Shortness of breath, difficulty breathing   The clinic staff is available to answer your questions during regular business hours. Please don't hesitate to call and ask to speak to one of the nurses or medical assistants for clinical concerns. If you have a medical emergency, go to the nearest emergency room or call 911.  A surgeon from [facility] is always on call at the hospital.     For further information, please visit [email or web address]      

## 2016-10-29 NOTE — H&P (View-Only) (Signed)
Kelly Mendez 10/22/2016 7:51 AM Location: [facility] Patient #: [id number] DOB: [birth year] Undefined / Language: Kelly Mendez / Race: Refused to Report/Unreported Female  History of Present Illness Kelly Mendez A. 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  sHE HAS NO COMPLAINTS. pT DENIES PAIN MASS OR DISCHARGE.                CLINICAL DATA: Right breast slightly upper inner quadrant focal asymmetry seen on recent screening mammography.  EXAM: 2D DIGITAL DIAGNOSTIC RIGHT MAMMOGRAM WITH CAD AND ADJUNCT TOMO  ULTRASOUND RIGHT BREAST  COMPARISON: Previous exam(s).  ACR Breast Density Category b: There are scattered areas of fibroglandular density.  FINDINGS: Additional mammographic views of the right breast demonstrate persistent circumscribed mass in the slightly upper inner right breast, posterior depth, measuring 9 mm mammographically.  Mammographic images were processed with CAD.  On physical exam, there is a subtle palpable approximately 1 cm mass in the right breast upper inner quadrant, posterior depth.  Targeted ultrasound is performed, showing right breast 1 o'clock 18 cm from the nipple hypoechoic lobulated mass measuring 0.9 x 0.6 x 0.8 cm. Wide hyperechoic halo is seen surrounding the mass. Ultrasound evaluation of the right axilla demonstrates 1 indeterminate low right axillary lymph node with borderline thickening of its anterior cortex.  IMPRESSION: 9 mm right breast 1 o'clock mass, for which ultrasound-guided core needle biopsy is recommended.  1 indeterminate low right axillary lymph node, for which ultrasound-guided core needle biopsy is recommended.  RECOMMENDATION: Ultrasound-guided core needle biopsy of right breast and right axilla.  I have  discussed the findings and recommendations with the patient. Results were also provided in writing at the conclusion of the visit. If applicable, a reminder letter will be sent to the patient regarding the next appointment.  BI-RADS CATEGORY 4: Suspicious.   Electronically Signed By: Fidela Salisbury M.D. On: 10/13/2016 15:13             ADDITIONAL INFORMATION: 1. PROGNOSTIC INDICATORS Results: IMMUNOHISTOCHEMICAL AND MORPHOMETRIC ANALYSIS PERFORMED MANUALLY Estrogen Receptor: 0%, NEGATIVE Progesterone Receptor: 0%, NEGATIVE Proliferation Marker Ki67: 80% COMMENT: The negative hormone receptor study(ies) in this case has No internal positive control. REFERENCE RANGE ESTROGEN RECEPTOR NEGATIVE 0% POSITIVE =>1% REFERENCE RANGE PROGESTERONE RECEPTOR NEGATIVE 0% POSITIVE =>1% All controls stained appropriately [name] Pathologist, Electronic Signature ( Signed 10/21/2016) 1. FLUORESCENCE IN-SITU HYBRIDIZATION Results: HER2 - NEGATIVE RATIO OF HER2/CEP17 SIGNALS 1.30 AVERAGE HER2 COPY NUMBER PER CELL 1.75 1 of 3 FINAL for Kelly Mendez ([id number]) ADDITIONAL INFORMATION:(continued) Reference Range: NEGATIVE HER2/CEP17 Ratio <2.0 and average HER2 copy number <4.0 EQUIVOCAL HER2/CEP17 Ratio <2.0 and average HER2 copy number >=4.0 and <6.0 POSITIVE HER2/CEP17 Ratio >=2.0 or <2.0 and average HER2 copy number >=6.0 [name] Pathologist, Electronic Signature ( Signed 10/22/2016) FINAL DIAGNOSIS Diagnosis 1. Breast, right, needle core biopsy, 1:00 o'clock - INVASIVE DUCTAL CARCINOMA. - SEE COMMENT. 2. Lymph node, needle/core biopsy, right axilla - THERE IS NO EVIDENCE OF CARCINOMA IN 1 OF 1 LYMPH NODE.  The patient is a 41 year old female.   Past Surgical History [name], RN; 10/22/2016 7:52 AM) Breast Biopsy Right.  Diagnostic Studies History [name], RN; 10/22/2016 7:52 AM) Colonoscopy never Mammogram within last year Pap  Smear 1-5 years ago  Medication History [name], RN; 10/22/2016 7:52 AM) Medications  Reconciled  Social History [name], RN; 10/22/2016 7:52 AM) Alcohol use Moderate alcohol use. Caffeine use Carbonated beverages, Coffee, Tea. No drug use Tobacco use Current some day smoker.  Family History [name], RN; 10/22/2016 7:52 AM) Arthritis Father. Breast Cancer Mother. Depression Mother. Diabetes Mellitus Father. Hypertension Father. Thyroid problems Mother.  Pregnancy / Birth History [name], RN; 10/22/2016 7:52 AM) Age at menarche 54 years. Contraceptive History Depo-provera, Oral contraceptives. Gravida 3 Irregular periods Maternal age <15 Para 1  Other Problems [name], RN; 10/22/2016 7:52 AM) Anxiety Disorder Cervical Cancer Depression Diabetes Mellitus Hemorrhoids Lump In Breast Oophorectomy     Review of Systems [name] RN; 10/22/2016 7:52 AM) General Present- Night Sweats. Not Present- Appetite Loss, Chills, Fatigue, Fever, Weight Gain and Weight Loss. Skin Not Present- Change in Wart/Mole, Dryness, Hives, Jaundice, New Lesions, Non-Healing Wounds, Rash and Ulcer. HEENT Not Present- Earache, Hearing Loss, Hoarseness, Nose Bleed, Oral Ulcers, Ringing in the Ears, Seasonal Allergies, Sinus Pain, Sore Throat, Visual Disturbances, Wears glasses/contact lenses and Yellow Eyes. Respiratory Not Present- Bloody sputum, Chronic Cough, Difficulty Breathing, Snoring and Wheezing. Breast Present- Breast Mass. Not Present- Breast Pain, Nipple Discharge and Skin Changes. Cardiovascular Not Present- Chest Pain, Difficulty Breathing Lying Down, Leg Cramps, Palpitations, Rapid Heart Rate, Shortness of Breath and Swelling of Extremities. Gastrointestinal Not Present- Abdominal Pain, Bloating, Bloody Stool, Change in Bowel Habits, Chronic diarrhea, Constipation, Difficulty Swallowing, Excessive gas, Gets full quickly at meals, Hemorrhoids,  Indigestion, Nausea, Rectal Pain and Vomiting. Female Genitourinary Not Present- Frequency, Nocturia, Painful Urination, Pelvic Pain and Urgency. Musculoskeletal Present- Joint Pain. Not Present- Back Pain, Joint Stiffness, Muscle Pain, Muscle Weakness and Swelling of Extremities. Neurological Not Present- Decreased Memory, Fainting, Headaches, Numbness, Seizures, Tingling, Tremor, Trouble walking and Weakness. Psychiatric Present- Anxiety and Change in Sleep Pattern. Not Present- Bipolar, Depression, Fearful and Frequent crying. Endocrine Present- New Diabetes. Not Present- Cold Intolerance, Excessive Hunger, Hair Changes, Heat Intolerance and Hot flashes. Hematology Not Present- Blood Thinners, Easy Bruising, Excessive bleeding, Gland problems, HIV and Persistent Infections.   Physical Exam (Rozella Servello A. Brett Soza MD; 10/22/2016 2:54 PM)  General Mental Status-Alert. General Appearance-Consistent with stated age. Hydration-Well hydrated. Voice-Normal.  Head and Neck Head-normocephalic, atraumatic with no lesions or palpable masses. Trachea-midline. Thyroid Gland Characteristics - normal size and consistency.  Eye Eyeball - Bilateral-Extraocular movements intact. Sclera/Conjunctiva - Bilateral-No scleral icterus.  Chest and Lung Exam Chest and lung exam reveals -quiet, even and easy respiratory effort with no use of accessory muscles and on auscultation, normal breath sounds, no adventitious sounds and normal vocal resonance. Inspection Chest Wall - Normal. Back - normal.  Breast Breast - Left-Symmetric, Non Tender, No Biopsy scars, no Dimpling, No Inflammation, No Lumpectomy scars, No Mastectomy scars, No Peau d' Orange. Breast - Right-Symmetric, Non Tender, No Biopsy scars, no Dimpling, No Inflammation, No Lumpectomy scars, No Mastectomy scars, No Peau d' Orange. Breast Lump-No Palpable Breast Mass.  Cardiovascular Cardiovascular examination reveals  -normal heart sounds, regular rate and rhythm with no murmurs and normal pedal pulses bilaterally.  Abdomen Inspection Inspection of the abdomen reveals - No Hernias. Skin - Scar - no surgical scars. Palpation/Percussion Palpation and Percussion of the abdomen reveal - Soft, Non Tender, No Rebound tenderness, No Rigidity (guarding) and No hepatosplenomegaly. Auscultation Auscultation of the abdomen reveals - Bowel sounds normal.  Neurologic Neurologic evaluation reveals -alert and oriented x 3 with no impairment of recent or remote memory. Mental Status-Normal.  Musculoskeletal Normal Exam - Left-Upper Extremity Strength Normal  and Lower Extremity Strength Normal. Normal Exam - Right-Upper Extremity Strength Normal and Lower Extremity Strength Normal.  Lymphatic Head & Neck  General Head & Neck Lymphatics: Bilateral - Description - Normal. Axillary  General Axillary Region: Bilateral - Description - Normal. Tenderness - Non Tender. Femoral & Inguinal  Generalized Femoral & Inguinal Lymphatics: Bilateral - Description - Normal. Tenderness - Non Tender.    Assessment & Plan (Nafisah Runions A. Lewie Deman MD; 10/22/2016 2:55 PM)  BREAST CANCER, RIGHT (C50.911) Impression: pt getting genetics and desires neoadjuvant chemotherapy. She would like to conserve breast discussed port placement Pt requires port placement for chemotherapy. Risk include bleeding, infection, pneumothorax, hemothorax, mediastinal injury, nerve injury , blood vessel injury, strke, blood clots, death, migration. embolization and need for additional procedures. Pt agrees to proceed.  Current Plans You are being scheduled for surgery- Our schedulers will call you.  You should hear from our office's scheduling department within 5 working days about the location, date, and time of surgery. We try to make accommodations for patient's preferences in scheduling surgery, but sometimes the OR schedule or the  surgeon's schedule prevents [place] from making those accommodations.  If you have not heard from our office [phone]) in 5 working days, call the office and ask for your surgeon's nurse.  If you have other questions about your diagnosis, plan, or surgery, call the office and ask for your surgeon's nurse.  Pt Education - [facility] Breast Cancer Information Given - [employer] "Breast Journey" Package Use of a central venous catheter for intravenous therapy was discussed. Technique of catheter placement using ultrasound and fluoroscopy guidance was discussed. Risks such as bleeding, infection, pneumothorax, catheter occlusion, reoperation, and other risks were discussed. I noted a good likelihood this will help address the problem. Questions were answered. The patient expressed understanding & wishes to proceed.

## 2016-10-29 NOTE — Transfer of Care (Signed)
Immediate Anesthesia Transfer of Care Note  Patient: Kelly Mendez  Procedure(s) Performed: Procedure(s): INSERTION PORT-A-CATH WITH [place] (Right)  Patient Location: PACU  Anesthesia Type:General  Level of Consciousness: awake, alert  and oriented  Airway & Oxygen Therapy: Patient Spontanous Breathing and Patient connected to nasal cannula oxygen  Post-op Assessment: Report given to RN and Post -op Vital signs reviewed and stable  Post vital signs: Reviewed and stable  Last Vitals:  Vitals:   10/29/16 0707 10/29/16 0933  BP: 135/63   Pulse: 90 (P) 95  Resp: 18 (!) (P) 21  Temp: 37 C (P) 36.9 C    Last Pain:  Vitals:   10/29/16 0707  TempSrc: Oral      Patients Stated Pain Goal: 6 (12/87/86 7672)  Complications: No apparent anesthesia complications

## 2016-10-29 NOTE — Interval H&P Note (Signed)
History and Physical Interval Note:  10/29/2016 7:39 AM  Kelly Mendez  has presented today for surgery, with the diagnosis of BREAST CANCER  The various methods of treatment have been discussed with the patient and family. After consideration of risks, benefits and other options for treatment, the patient has consented to  Procedure(s): INSERTION PORT-A-CATH WITH [place] (N/A) as a surgical intervention .  The patient's history has been reviewed, patient examined, no change in status, stable for surgery.  I have reviewed the patient's chart and labs.  Questions were answered to the patient's satisfaction.     Carrisa Keller A.

## 2016-10-29 NOTE — Op Note (Signed)
Preoperative diagnosis: PAC needed  Postoperative diagnosis: Same  Procedure: Portacath Placement WITH U/S AND C ARM   Surgeon: Turner Daniels, MD, FACS  Anesthesia: General and 0.25 % marcaine   Clinical History and Indications: The patient is getting ready to begin chemotherapy for her cancer. She  needs a Port-A-Cath for venous access. Risk of bleeding, infection,  Collapse lung,  Death,  DVT,  Organ injury,  Mediastinal injury,  Injury to heart,  Injury to blood vessels,  Nerves,  Migration of catheter,  Embolization of catheter and the need for more surgery.  Description of Procedure: I have seen the patient in the holding area and confirmed the plans for the procedure as noted above. I reviewed the risks and complications again and the patient has no further questions. She wishes to proceed.   The patient was then taken to the operating room. After satisfactory general  anesthesia had been obtained the upper chest and lower neck were prepped and draped as a sterile field. The timeout was done.  The right internal jugular vein  was entered under U/S guidance  and the guidewire threaded into the superior vena cava right atrial area under fluoroscopic guidance. An incision was then made on the anterior chest wall and a subcutaneous pocket fashioned for the port reservoir.  The port tubing was then brought through a subcutaneous tunnel from the port site to the guidewire site.  The port and catheter were attached, locked  and flushed. The catheter was measured and cut to appropriate length.The dilator and peel-away sheath were then advanced over the guidewire while monitoring this with fluoroscopy. The guidewire and dilator were removed and the tubing threaded to approximately 21 cm. The peel-away sheath was then removed. The catheter aspirated and flushed easily. Using fluoroscopy the tip was in the superior vena cava right atrial junction area. It aspirated and flushed easily. That aspirated  and flushed easily.  The reservoir was secured to the fascia with 1 sutures of 2-0 Prolene. A final check with fluoroscopy was done to make sure we had no kinks and good positioning of the tip of the catheter. Everything appeared to be okay. The catheter was aspirated, flushed with dilute heparin and then concentrated aqueous heparin.  The incision was then closed with interrupted 3-0 Vicryl, and 4-0 Monocryl subcuticular with Dermabond on the skin.  There were no operative complications. Estimated blood loss was minimal. All counts were correct. The patient tolerated the procedure well.  Turner Daniels, MD, FACS

## 2016-10-30 ENCOUNTER — Encounter (HOSPITAL_COMMUNITY): Payer: Self-pay | Admitting: Surgery

## 2016-10-31 ENCOUNTER — Ambulatory Visit (HOSPITAL_BASED_OUTPATIENT_CLINIC_OR_DEPARTMENT_OTHER): Payer: Managed Care, Other (non HMO) | Admitting: Oncology

## 2016-10-31 ENCOUNTER — Encounter: Payer: Self-pay | Admitting: Oncology

## 2016-10-31 VITALS — BP 151/85 | HR 108 | Temp 98.0°F | Resp 18 | Ht 74.0 in | Wt 349.2 lb

## 2016-10-31 DIAGNOSIS — C50211 Malignant neoplasm of upper-inner quadrant of right female breast: Secondary | ICD-10-CM

## 2016-10-31 DIAGNOSIS — Z720 Tobacco use: Secondary | ICD-10-CM

## 2016-10-31 DIAGNOSIS — Z171 Estrogen receptor negative status [ER-]: Secondary | ICD-10-CM

## 2016-10-31 MED ORDER — LIDOCAINE-PRILOCAINE 2.5-2.5 % EX CREA: TOPICAL_CREAM | CUTANEOUS | 3 refills | Status: DC

## 2016-10-31 MED ORDER — DEXAMETHASONE 4 MG PO TABS: ORAL_TABLET | ORAL | 1 refills | Status: DC

## 2016-10-31 MED ORDER — LORAZEPAM 0.5 MG PO TABS: 0.5000 mg | ORAL_TABLET | Freq: Every evening | ORAL | 0 refills | Status: DC | PRN

## 2016-10-31 MED ORDER — PROCHLORPERAZINE MALEATE 10 MG PO TABS: 10.0000 mg | ORAL_TABLET | Freq: Four times a day (QID) | ORAL | 1 refills | Status: DC | PRN

## 2016-10-31 NOTE — Progress Notes (Signed)
Enrolled pt in the [employer] First Step program.  Faxed signed form and activated card today.  °

## 2016-10-31 NOTE — Progress Notes (Signed)
[facility]  Telephone:([phone] Fax:([phone]) [phone]     ID: Kelly Mendez DOB: 02/19/1976  MR#: [id number]  QPY#:[id number]  Patient Care Team: [name], NP-C as PCP - General (Family Medicine) [name], MD as Consulting Physician (General Surgery) Chauncey Cruel, MD as Consulting Physician (Oncology) Eppie Gibson, MD as Attending Physician (Radiation Oncology) Chauncey Cruel, MD OTHER MD:  CHIEF COMPLAINT: Triple negative breast cancer  CURRENT TREATMENT: Neoadjuvant chemotherapy   BREAST CANCER HISTORY: From the original intake note:  Kelly Mendez had her first ever mammogram 10/07/2016 at the [facility]. This showed a possible mass in the right breast. On 10/13/2016 she underwent bilateral diagnostic mammography with tomography and right breast ultrasonography. This found the breast density to be category B. In the upper inner quadrant of the right breast there was a circumscribed mass which was barely palpable. Ultrasonography confirmed a 0.9 cm right breast mass at the 1:00 radiant 18 cm from the nipple ultrasound of the axilla showed 1 indeterminate right axillary lymph node with borderline thickening of the anterior cortex.  On 10/16/2016 the patient underwent biopsy of the right breast mass and the suspicious right axillary lymph node. The right breast mass proved to be an invasive ductal carcinoma, grade 3, estrogen and progesterone receptor negative, with an MIB-1 of 80%, and no HER-2 amplification, the signals ratio being 1.30 and the number per cell 1.75. The lymph node was negative and concordant.  Her subsequent history is as detailed below  INTERVAL HISTORY: Kelly Mendez returns today for follow-up of her triple negative breast cancer accompanied by her mother. Since her last visit here she had her port placed. That is of course somewhat uncomfortable but she is managing well with some ice and nonsteroidal analgesics. She had a breast MRI on  10/29/2016 which shows only the single solitary lesion measuring 1.2 cm, with no evidence of lymph node spread. She is scheduled for echocardiography 11/03/2016 and she will start her chemotherapy 11/05/2016. She is here today to discuss supportive treatment and management of side effects  REVIEW OF SYSTEMS: She has applied for disability so she can dedicate her energies to sliding this problem. A detailed review of systems today was otherwise stable  PAST MEDICAL HISTORY: Past Medical History:  Diagnosis Date  . Abnormal Pap smear of cervix   . Anxiety   . Asthma   . Cancer ([place])   . Concussion    around age 27  . Depression   . Diabetes type 2, uncontrolled ([place])    new diagnosis in 08/2016  . HPV in female   . Hyperlipemia   . PTSD (post-traumatic stress disorder)     PAST SURGICAL HISTORY: Past Surgical History:  Procedure Laterality Date  . COLPOSCOPY    . left ovary removed  1978  . PORTACATH PLACEMENT Right 10/29/2016   Procedure: INSERTION PORT-A-CATH WITH [place];  Surgeon: [name], MD;  Location: [facility];  Service: General;  Laterality: Right;    FAMILY HISTORY Family History  Problem Relation Age of Onset  . Breast cancer Mother   . Hepatitis C Father   The patient's mother was diagnosed with breast cancer at age 17, but tells me the lump in her breast had been present for at least 10 years prior to that. She is now 39 and doing well. The patient's father died at the age of 29 from sepsis following liver transplantation. The patient has 2 brothers, no sisters. The patient tells me that she has at  least 5 and sent cousins with breast cancer and there are other relatives with uterine cancer.  GYNECOLOGIC HISTORY:  Patient's last menstrual period was 10/27/2016. Menarche age 31, first live birth age 22, the patient is GX P1. She has had multiple progesterone and oral contraceptive treatments through [facility] because of her menometrorrhagia. 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 Her husband [name] is a truck Geophysicist/field seismologist. The patient's daughter [name] is studying pre-veterinary medicine in college. [name] has 3 children, all boys, a keen is a cook in [place] and lives independently. The 2 younger boys, [name] and [name], aged 3 and 68 as of April 2018, are at home with the patient    ADVANCED DIRECTIVES: Not in place   HEALTH MAINTENANCE: Social History  Substance Use Topics  . Smoking status: Current Every Day Smoker    Packs/day: 0.15    Years: 25.00    Types: Cigarettes  . Smokeless tobacco: Never Used  . Alcohol use 0.6 oz/week    1 Glasses of wine per week     Colonoscopy: n/a  PAP:  Bone density:   Allergies  Allergen Reactions  . Penicillins Other (See Comments)    As a child Has patient had a PCN reaction causing immediate rash, facial/tongue/throat swelling, SOB or lightheadedness with hypotension: unknown Has patient had a PCN reaction causing severe rash involving mucus membranes or skin necrosis: unknown Has patient had a PCN reaction that required hospitalization unknown Has patient had a PCN reaction occurring within the last 10 years: no If all of the above answers are "NO", then may proceed with Cephalosporin use.     Current Outpatient Prescriptions  Medication Sig Dispense Refill  . albuterol (PROVENTIL HFA;VENTOLIN HFA) 108 (90 Base) MCG/ACT inhaler Inhale 1-2 puffs into the lungs every 6 (six) hours as needed for wheezing or shortness of breath.    Marland Kitchen atorvastatin (LIPITOR) 20 MG tablet Take 1 tablet (20 mg total) by mouth daily. 30 tablet 1  . dexamethasone (DECADRON) 4 MG tablet Take 2 tablets by mouth once a day on the day after chemotherapy and then take 2 tablets two times a day for 2 days. Take with food. 30 tablet 1  . Glucose Blood (BLOOD GLUCOSE TEST STRIPS) STRP Test twice a day. Pt uses one touch verio flex meter  100 each 5  . ibuprofen (ADVIL,MOTRIN) 200 MG tablet Take 400 mg by mouth daily as needed for headache.    . lidocaine-prilocaine (EMLA) cream Apply to affected area once 30 g 3  . LORazepam (ATIVAN) 0.5 MG tablet Take 1 tablet (0.5 mg total) by mouth at bedtime as needed (Nausea or vomiting). 30 tablet 0  . medroxyPROGESTERone (DEPO-PROVERA) 150 MG/ML injection Inject 150 mg into the muscle every 3 (three) months. Last dose January 2018    . Melatonin 10 MG TABS Take 10 mg by mouth at bedtime.    . metFORMIN (GLUCOPHAGE) 1000 MG tablet Take 1 tablet (1,000 mg total) by mouth 2 (two) times daily with a meal. 60 tablet 2  . ONETOUCH DELICA LANCETS FINE MISC Test twice a day 100 each 5  . oxyCODONE (OXY IR/ROXICODONE) 5 MG immediate release tablet Take 1-2 tablets (5-10 mg total) by mouth every 6 (six) hours as needed for severe pain. 25 tablet 0  . prochlorperazine (COMPAZINE) 10 MG tablet Take 1 tablet (10 mg total) by mouth every 6 (six) hours as needed (  Nausea or vomiting). 30 tablet 1   No current facility-administered medications for this visit.     OBJECTIVE: Morbidly obese Young African-American woman  Vitals:   10/31/16 1347  BP: (!) 151/85  Pulse: (!) 108  Resp: 18  Temp: 98 F (36.7 C)     Body mass index is 44.83 kg/m.    ECOG FS:1 - Symptomatic but completely ambulatory  Sclerae unicteric, pupils round and equal Oropharynx clear and moist No cervical or supraclavicular adenopathy Lungs no rales or rhonchi Heart regular rate and rhythm Abd soft, nontender, positive bowel sounds MSK no focal spinal tenderness, no upper extremity lymphedema Neuro: nonfocal, well oriented, appropriate affect Breasts: There are no palpable masses in either breast, and there are no skin or nipple changes of concern in either breast. Both axillae are benign.  LAB RESULTS:  CMP     Component Value Date/Time   NA 136 10/29/2016 0710   NA 138 10/22/2016 1216   K 3.8 10/29/2016 0710   K 4.1  10/22/2016 1216   CL 105 10/29/2016 0710   CO2 22 10/29/2016 0710   CO2 22 10/22/2016 1216   GLUCOSE 158 (H) 10/29/2016 0710   GLUCOSE 154 (H) 10/22/2016 1216   BUN 13 10/29/2016 0710   BUN 9.6 10/22/2016 1216   CREATININE 0.77 10/29/2016 0710   CREATININE 0.8 10/22/2016 1216   CALCIUM 9.4 10/29/2016 0710   CALCIUM 9.9 10/22/2016 1216   PROT 6.2 (L) 10/29/2016 0710   PROT 7.2 10/22/2016 1216   ALBUMIN 3.5 10/29/2016 0710   ALBUMIN 3.7 10/22/2016 1216   AST 15 10/29/2016 0710   AST 16 10/22/2016 1216   ALT 17 10/29/2016 0710   ALT 22 10/22/2016 1216   ALKPHOS 73 10/29/2016 0710   ALKPHOS 85 10/22/2016 1216   BILITOT 0.3 10/29/2016 0710   BILITOT 0.50 10/22/2016 1216   GFRNONAA >60 10/29/2016 0710   GFRAA >60 10/29/2016 0710    No results found for: TOTALPROTELP, ALBUMINELP, A1GS, A2GS, BETS, BETA2SER, GAMS, MSPIKE, SPEI  No results found for: Nils Pyle, [facility]  Lab Results  Component Value Date   WBC 9.3 10/29/2016   NEUTROABS 6.1 10/29/2016   HGB 12.4 10/29/2016   HCT 38.5 10/29/2016   MCV 76.8 (L) 10/29/2016   PLT 272 10/29/2016      Chemistry      Component Value Date/Time   NA 136 10/29/2016 0710   NA 138 10/22/2016 1216   K 3.8 10/29/2016 0710   K 4.1 10/22/2016 1216   CL 105 10/29/2016 0710   CO2 22 10/29/2016 0710   CO2 22 10/22/2016 1216   BUN 13 10/29/2016 0710   BUN 9.6 10/22/2016 1216   CREATININE 0.77 10/29/2016 0710   CREATININE 0.8 10/22/2016 1216      Component Value Date/Time   CALCIUM 9.4 10/29/2016 0710   CALCIUM 9.9 10/22/2016 1216   ALKPHOS 73 10/29/2016 0710   ALKPHOS 85 10/22/2016 1216   AST 15 10/29/2016 0710   AST 16 10/22/2016 1216   ALT 17 10/29/2016 0710   ALT 22 10/22/2016 1216   BILITOT 0.3 10/29/2016 0710   BILITOT 0.50 10/22/2016 1216       No results found for: LABCA2  No components found for: [id number]  No results for input(s): INR in the last 168 hours.  Urinalysis    Component Value  Date/Time   BILIRUBINUR n 09/29/2016 1045   PROTEINUR trace 09/29/2016 1045   UROBILINOGEN negative 09/29/2016 1045   NITRITE n  09/29/2016 1045   LEUKOCYTESUR Negative 09/29/2016 1045     STUDIES: Mr Breast Bilateral W Wo Contrast  Result Date: 10/29/2016 CLINICAL DATA:  41 year old female with new diagnosis of right breast grade 3 invasive ductal carcinoma. The patient is due to start chemotherapy in 1 week. The patient's mother was diagnosed with breast cancer diagnosed at age 74. LABS:  Creatinine was obtained on site at [facility] at [address] Results: Creatinine 0.7 mg/dL and GFR of 112. EXAM: BILATERAL BREAST MRI WITH AND WITHOUT CONTRAST TECHNIQUE: Multiplanar, multisequence MR images of both breasts were obtained prior to and following the intravenous administration of 19 ml of MultiHance. THREE-DIMENSIONAL MR IMAGE RENDERING ON INDEPENDENT WORKSTATION: Three-dimensional MR images were rendered by post-processing of the original MR data on an independent workstation. The three-dimensional MR images were interpreted, and findings are reported in the following complete MRI report for this study. Three dimensional images were evaluated at the independent DynaCad workstation COMPARISON:  No prior MRI available for comparison. FINDINGS: Breast composition: b. Scattered fibroglandular tissue. Background parenchymal enhancement: Minimal. Right breast: In the upper inner quadrant of the right breast, posterior depth there is an enhancing mass with an internal focus of susceptibility consistent with the biopsy-proven site of invasive disease which measures 1.2 x 1.0 x 1.0 cm. No other sites of disease are identified in the right breast. Left breast: No mass or abnormal enhancement. Lymph nodes: Linear enhancement extending from 1 of the right axillary lymph nodes to the skin surface likely corresponds with biopsy changes from the recent benign biopsy. No abnormal appearing lymph nodes.  Ancillary findings:  None. IMPRESSION: 1. Biopsy proven invasive breast cancer in the upper-inner quadrant of the right breast measures 1.2 cm. No evidence of disease elsewhere in the right breast. 2.  No MRI evidence of left breast malignancy. RECOMMENDATION: Continue treatment plan. BI-RADS CATEGORY  6: Known biopsy-proven malignancy. Electronically Signed   By: Ammie Ferrier M.D.   On: 10/29/2016 16:58   Dg Chest Port 1 View  Result Date: 10/29/2016 CLINICAL DATA:  Status post port placement EXAM: PORTABLE CHEST 1 VIEW COMPARISON:  05/06/2013 FINDINGS: Cardiac shadow is at the upper limits of normal in size. A new right chest wall port is noted with the catheter tip in the proximal superior vena cava just below the level of the azygos vein. Lungs are clear. No pneumothorax is noted. No bony abnormality is seen. IMPRESSION: Right chest wall port with catheter tip as described. Electronically Signed   By: Inez Catalina M.D.   On: 10/29/2016 10:08   Mm Digital Screening Bilateral  Result Date: 10/07/2016 CLINICAL DATA:  Screening. EXAM: DIGITAL SCREENING BILATERAL MAMMOGRAM WITH CAD COMPARISON:  Baseline mammogram. ACR Breast Density Category b: There are scattered areas of fibroglandular density. FINDINGS: In the right breast, a possible mass warrants further evaluation. In the left breast, no findings suspicious for malignancy. Images were processed with CAD. IMPRESSION: Further evaluation is suggested for possible mass in the right breast. RECOMMENDATION: Diagnostic mammogram and possibly ultrasound of the right breast. (Code:[id number]) The patient will be contacted regarding the findings, and additional imaging will be scheduled. BI-RADS CATEGORY  0: Incomplete. Need additional imaging evaluation and/or prior mammograms for comparison. Electronically Signed   By: Fidela Salisbury M.D.   On: 10/07/2016 11:22   Dg Fluoro Guide Cv Line-no Report  Result Date: 10/29/2016 Fluoroscopy was utilized by  the requesting physician.  No radiographic interpretation.   US Breast Ltd Uni Right Inc  Axilla  Result Date: 10/13/2016 CLINICAL DATA:  Right breast slightly upper inner quadrant focal asymmetry seen on recent screening mammography. EXAM: 2D DIGITAL DIAGNOSTIC RIGHT MAMMOGRAM WITH CAD AND ADJUNCT TOMO ULTRASOUND RIGHT BREAST COMPARISON:  Previous exam(s). ACR Breast Density Category b: There are scattered areas of fibroglandular density. FINDINGS: Additional mammographic views of the right breast demonstrate persistent circumscribed mass in the slightly upper inner right breast, posterior depth, measuring 9 mm mammographically. Mammographic images were processed with CAD. On physical exam, there is a subtle palpable approximately 1 cm mass in the right breast upper inner quadrant, posterior depth. Targeted ultrasound is performed, showing right breast 1 o'clock 18 cm from the nipple hypoechoic lobulated mass measuring 0.9 x 0.6 x 0.8 cm. Wide hyperechoic halo is seen surrounding the mass. Ultrasound evaluation of the right axilla demonstrates 1 indeterminate low right axillary lymph node with borderline thickening of its anterior cortex. IMPRESSION: 9 mm right breast 1 o'clock mass, for which ultrasound-guided core needle biopsy is recommended. 1 indeterminate low right axillary lymph node, for which ultrasound-guided core needle biopsy is recommended. RECOMMENDATION: Ultrasound-guided core needle biopsy of right breast and right axilla. I have discussed the findings and recommendations with the patient. Results were also provided in writing at the conclusion of the visit. If applicable, a reminder letter will be sent to the patient regarding the next appointment. BI-RADS CATEGORY  4: Suspicious. Electronically Signed   By: Fidela Salisbury M.D.   On: 10/13/2016 15:13   Mm Diag Breast Tomo Uni Right  Result Date: 10/13/2016 CLINICAL DATA:  Right breast slightly upper inner quadrant focal asymmetry seen on  recent screening mammography. EXAM: 2D DIGITAL DIAGNOSTIC RIGHT MAMMOGRAM WITH CAD AND ADJUNCT TOMO ULTRASOUND RIGHT BREAST COMPARISON:  Previous exam(s). ACR Breast Density Category b: There are scattered areas of fibroglandular density. FINDINGS: Additional mammographic views of the right breast demonstrate persistent circumscribed mass in the slightly upper inner right breast, posterior depth, measuring 9 mm mammographically. Mammographic images were processed with CAD. On physical exam, there is a subtle palpable approximately 1 cm mass in the right breast upper inner quadrant, posterior depth. Targeted ultrasound is performed, showing right breast 1 o'clock 18 cm from the nipple hypoechoic lobulated mass measuring 0.9 x 0.6 x 0.8 cm. Wide hyperechoic halo is seen surrounding the mass. Ultrasound evaluation of the right axilla demonstrates 1 indeterminate low right axillary lymph node with borderline thickening of its anterior cortex. IMPRESSION: 9 mm right breast 1 o'clock mass, for which ultrasound-guided core needle biopsy is recommended. 1 indeterminate low right axillary lymph node, for which ultrasound-guided core needle biopsy is recommended. RECOMMENDATION: Ultrasound-guided core needle biopsy of right breast and right axilla. I have discussed the findings and recommendations with the patient. Results were also provided in writing at the conclusion of the visit. If applicable, a reminder letter will be sent to the patient regarding the next appointment. BI-RADS CATEGORY  4: Suspicious. Electronically Signed   By: Fidela Salisbury M.D.   On: 10/13/2016 15:13   [place] Axillary Node Core Biopsy Right  Addendum Date: 10/17/2016   ADDENDUM REPORT: 10/17/2016 11:26 ADDENDUM: Pathology revealed grade III invasive ductal carcinoma in the right breast and no evidence of carcinoma in the right axillary lymph node. This was found to be concordant by Dr. Claudie Revering. Pathology results were discussed with the  patient by telephone. The patient reported doing well after the biopsies with tenderness at the sites. Post biopsy instructions and care were reviewed and questions were  answered. The patient was encouraged to call [facility] for any additional concerns. The patient was referred to the [facility] at the [facility] on October 22, 2016. Pathology results reported by Susa Raring RN, BSN on 10/17/2016. Electronically Signed   By: Claudie Revering M.D.   On: 10/17/2016 11:26   Result Date: 10/17/2016 CLINICAL DATA:  Borderline abnormal right axillary lymph node at recent ultrasound. EXAM: ULTRASOUND GUIDED CORE NEEDLE BIOPSY OF A RIGHT AXILLARY NODE COMPARISON:  Previous exam(s). FINDINGS: I met with the patient and we discussed the procedure of ultrasound-guided biopsy, including benefits and alternatives. We discussed the high likelihood of a successful procedure. We discussed the risks of the procedure, including infection, bleeding, tissue injury, clip migration, and inadequate sampling. Informed written consent was given. The usual time-out protocol was performed immediately prior to the procedure. Using sterile technique and 1% Lidocaine as local anesthetic, under direct ultrasound visualization, a 14 gauge spring-loaded device was used to perform biopsy of the recently demonstrated borderline abnormal right axillary lymph node using a medial approach. At the conclusion of the procedure a HydroMARK tissue marker clip was deployed into the biopsy cavity. Follow up 2 view mammogram was performed and dictated separately. IMPRESSION: Ultrasound guided biopsy of the recently demonstrated borderline abnormal right axillary lymph node. No apparent complications. Electronically Signed: By: Claudie Revering M.D. On: 10/16/2016 15:03   Mm Clip Placement Right  Result Date: 10/16/2016 CLINICAL DATA:  Status post ultrasound-guided core needle biopsies of a  9 mm mass in the 1 o'clock position of the right breast and a borderline abnormal right axillary lymph node. EXAM: DIAGNOSTIC RIGHT MAMMOGRAM POST ULTRASOUND BIOPSY COMPARISON:  Previous exam(s). FINDINGS: Mammographic images were obtained following ultrasound guided biopsy of a 9 mm mass in the 1 o'clock position of the right breast and a borderline abnormal right axillary lymph node. These demonstrate a ribbon shaped biopsy marker clip within the biopsied breast mass and a HydroMARK biopsy marker clip within the biopsied lymph node. IMPRESSION: Appropriate deployment of the biopsy marker clips following right breast and right axillary lymph node biopsies. Final Assessment: Post Procedure Mammograms for Marker Placement Electronically Signed   By: Claudie Revering M.D.   On: 10/16/2016 15:23   [place] Rt Breast Bx W Loc Dev 1st Lesion Img Bx Spec US Guide  Addendum Date: 10/17/2016   ADDENDUM REPORT: 10/17/2016 11:26 ADDENDUM: Pathology revealed grade III invasive ductal carcinoma in the right breast and no evidence of carcinoma in the right axillary lymph node. This was found to be concordant by Dr. Claudie Revering. Pathology results were discussed with the patient by telephone. The patient reported doing well after the biopsies with tenderness at the sites. Post biopsy instructions and care were reviewed and questions were answered. The patient was encouraged to call [facility] for any additional concerns. The patient was referred to the [facility] at the [facility] on October 22, 2016. Pathology results reported by Susa Raring RN, BSN on 10/17/2016. Electronically Signed   By: Claudie Revering M.D.   On: 10/17/2016 11:26   Result Date: 10/17/2016 CLINICAL DATA:  9 mm mass in the 1 o'clock position of the right breast. EXAM: ULTRASOUND GUIDED RIGHT BREAST CORE NEEDLE BIOPSY COMPARISON:  Previous exam(s). FINDINGS: I met with the patient and we  discussed the procedure of ultrasound-guided biopsy, including benefits and alternatives. We discussed the high  likelihood of a successful procedure. We discussed the risks of the procedure, including infection, bleeding, tissue injury, clip migration, and inadequate sampling. Informed written consent was given. The usual time-out protocol was performed immediately prior to the procedure. Lesion quadrant: Right upper inner quadrant Using sterile technique and 1% Lidocaine as local anesthetic, under direct ultrasound visualization, a 12 gauge spring-loaded device was used to perform biopsy of the recently demonstrated 9 mm mass in the 1 o'clock position of the right breast using a caudal approach. At the conclusion of the procedure a ribbon shaped tissue marker clip was deployed into the biopsy cavity. Follow up 2 view mammogram was performed and dictated separately. IMPRESSION: Ultrasound guided biopsy of a 9 mm mass in the 1 o'clock position of the right breast. No apparent complications. Electronically Signed: By: Claudie Revering M.D. On: 10/16/2016 14:58    ELIGIBLE FOR AVAILABLE RESEARCH PROTOCOL: not a candidate for PREVENT  ASSESSMENT: 41 y.o.  [place] woman status post right breast upper inner quadrant biopsy 10/16/2016 for a clinical T1b pN0, stage IB invasive ductal carcinoma, grade 3, triple negative, with an MIB-1 of 80%.  (1)  genetics testing scheduled for 12/01/2016  (2) neoadjuvant chemotherapy to consist of doxorubicin and cyclophosphamide in dose dense fashion 4 starting 11/05/2016 followed by weekly paclitaxel 12  (3) breast conserving surgery to follow  (4) adjuvant radiation to follow chemotherapy     PLAN: I spent approximately 30 minutes with Kelly Mendez with most of that time spent discussing her overall situation and upcoming treatment. We reviewed the possible toxicities, side effects and complications of cyclophosphamide and doxorubicin. I gave her a written "roadmap" on how to  take her supportive medications and placed all the prescriptions in for her.  We are going to be seeing her essentially weekly for the next 5 months and I have placed follow-up appointment for the next several weeks for her. I also have encouraged her to call between appointment with any question or concern that may develop.  Her final surgical decision will depend on genetics testing at this point I think she has an excellent chance of breast conservation assuming there is no deleterious mutation identified  She understands the goal of treatment is cure and that she has an excellent chance of obtaining that goal with standard treatment as planned  She will call with any problems that may develop before her next visit.    Chauncey Cruel, MD   11/01/2016 8:59 AM Medical Oncology and Hematology [facility] [address] [address] [place], [place] [address] Tel. [phone]    Fax. [phone]

## 2016-11-03 ENCOUNTER — Ambulatory Visit (HOSPITAL_COMMUNITY)
Admission: RE | Admit: 2016-11-03 | Discharge: 2016-11-03 | Disposition: A | Discharge status: Home / Self Care | Payer: Managed Care, Other (non HMO) | Source: Ambulatory Visit | Attending: Family Medicine | Admitting: Family Medicine

## 2016-11-03 ENCOUNTER — Encounter (HOSPITAL_COMMUNITY): Payer: Self-pay | Admitting: Internal Medicine

## 2016-11-03 ENCOUNTER — Ambulatory Visit (HOSPITAL_BASED_OUTPATIENT_CLINIC_OR_DEPARTMENT_OTHER)
Admission: RE | Admit: 2016-11-03 | Discharge: 2016-11-03 | Disposition: A | Discharge status: Home / Self Care | Payer: Managed Care, Other (non HMO) | Source: Ambulatory Visit | Attending: Internal Medicine | Admitting: Internal Medicine

## 2016-11-03 ENCOUNTER — Telehealth: Payer: Self-pay | Admitting: Oncology

## 2016-11-03 VITALS — BP 133/85 | HR 76 | Wt 354.2 lb

## 2016-11-03 DIAGNOSIS — Z171 Estrogen receptor negative status [ER-]: Secondary | ICD-10-CM | POA: Diagnosis present

## 2016-11-03 DIAGNOSIS — C50211 Malignant neoplasm of upper-inner quadrant of right female breast: Secondary | ICD-10-CM

## 2016-11-03 NOTE — Progress Notes (Signed)
CARDIO-ONCOLOGY CLINIC CONSULT NOTE  Referring Physician: Magrinat   HPI:  Kelly Mendez is a 41 y/o woman with h/o morbid obesity, DM2 (diagnosed 2/18) and HL and right breast cancer (diagnosed 4/18) referred by Dr. Jana Hakim for enrollment into the [facility].  On 10/16/2016 the patient underwent biopsy of the right breast mass and the suspicious right axillary lymph node. The right breast mass proved to be an invasive ductal carcinoma, grade 3, estrogen and progesterone receptor negative, with an MIB-1 of 80%, and no HER-2 amplification, the signals ratio being 1.30 and the number per cell 1.75. The lymph node was negative and concordant. T1b pN0, stage IB invasive ductal carcinoma, grade 3, triple negative, with an MIB-1 of 80%.  Treatment plan:  (1) neoadjuvant chemotherapy to consist of doxorubicin and cyclophosphamide in dose dense fashion 4 starting 11/05/2016 followed by weekly paclitaxel 12  (2) breast conserving surgery to follow  (3) adjuvant radiation to follow chemotherapy     Has strong h/o breast CA but no FHx of heart disease. Has been active. No CP or SOB. Denies HTN. Snores mildly. Smokes a few cigarettes a day.   Echo today EF 60-65% LS' 10.2 cm/s GLS -18.2%   Review of Systems: [y] = yes, _0  = no   General: Weight gain _1 ; Weight loss _2 ; Anorexia _3 ; Fatigue _4 ; Fever _5 ; Chills _6 ; Weakness _7   Cardiac: Chest pain/pressure _8 ; Resting SOB _9 ; Exertional SOB _10 ; Orthopnea _11 ; Pedal Edema _12 ; Palpitations _13 ; Syncope _14 ; Presyncope _15 ; Paroxysmal nocturnal dyspnea_16   Pulmonary: Cough _17 ; Wheezing_18 ; Hemoptysis_19 ; Sputum _20 ; Snoring _21   GI: Vomiting_22 ; Dysphagia_23 ; Melena_24 ; Hematochezia _25 ; Heartburn_26 ; Abdominal pain _27 ; Constipation _28 ; Diarrhea _29 ; BRBPR _30   GU: Hematuria_31 ; Dysuria _32 ; Nocturia_33   Vascular: Pain in legs with walking _34 ; Pain in feet with lying flat _35 ; Non-healing sores _36 ; Stroke _37 ; TIA [  ]; Slurred speech _38 ;  Neuro: Headaches_39 ; Vertigo_40 ; Seizures_41 ; Paresthesias_42 ;Blurred vision _43 ; Diplopia _44 ; Vision changes _45   Ortho/Skin: Arthritis _46 ; Joint pain _47 ; Muscle pain _48 ; Joint swelling _49 ; Back Pain _50 ; Rash _51   Psych: Depression_52 ; Anxiety_53   Heme: Bleeding problems _54 ; Clotting disorders _55 ; Anemia _56   Endocrine: Diabetes [ y]; Thyroid dysfunction_57    Past Medical History:  Diagnosis Date  . Abnormal Pap smear of cervix   . Anxiety   . Asthma   . Cancer ([place])   . Concussion    around age 57  . Depression   . Diabetes type 2, uncontrolled ([place])    new diagnosis in 08/2016  . HPV in female   . Hyperlipemia   . PTSD (post-traumatic stress disorder)     Current Outpatient Prescriptions  Medication Sig Dispense Refill  . albuterol (PROVENTIL HFA;VENTOLIN HFA) 108 (90 Base) MCG/ACT inhaler Inhale 1-2 puffs into the lungs every 6 (six) hours as needed for wheezing or shortness of breath.    Marland Kitchen atorvastatin (LIPITOR) 20 MG tablet Take 1 tablet (20 mg total) by mouth daily. 30 tablet 1  . dexamethasone (DECADRON) 4 MG tablet Take 2 tablets by mouth once a day on the day after chemotherapy and then take 2 tablets two times a day for 2 days. Take with food.  30 tablet 1  . Glucose Blood (BLOOD GLUCOSE TEST STRIPS) STRP Test twice a day. Pt uses one touch verio flex meter 100 each 5  . ibuprofen (ADVIL,MOTRIN) 200 MG tablet Take 400 mg by mouth daily as needed for headache.    . lidocaine-prilocaine (EMLA) cream Apply to affected area once 30 g 3  . LORazepam (ATIVAN) 0.5 MG tablet Take 1 tablet (0.5 mg total) by mouth at bedtime as needed (Nausea or vomiting). 30 tablet 0  . medroxyPROGESTERone (DEPO-PROVERA) 150 MG/ML injection Inject 150 mg into the muscle every 3 (three) months. Last dose January 2018    . Melatonin 10 MG TABS Take 10 mg by mouth at bedtime.    . metFORMIN (GLUCOPHAGE) 1000 MG tablet Take 1 tablet (1,000 mg total) by mouth 2 (two)  times daily with a meal. 60 tablet 2  . ONETOUCH DELICA LANCETS FINE MISC Test twice a day 100 each 5  . oxyCODONE (OXY IR/ROXICODONE) 5 MG immediate release tablet Take 1-2 tablets (5-10 mg total) by mouth every 6 (six) hours as needed for severe pain. 25 tablet 0  . prochlorperazine (COMPAZINE) 10 MG tablet Take 1 tablet (10 mg total) by mouth every 6 (six) hours as needed (Nausea or vomiting). 30 tablet 1   No current facility-administered medications for this encounter.     Allergies  Allergen Reactions  . Penicillins Other (See Comments)    As a child Has patient had a PCN reaction causing immediate rash, facial/tongue/throat swelling, SOB or lightheadedness with hypotension: unknown Has patient had a PCN reaction causing severe rash involving mucus membranes or skin necrosis: unknown Has patient had a PCN reaction that required hospitalization unknown Has patient had a PCN reaction occurring within the last 10 years: no If all of the above answers are "NO", then may proceed with Cephalosporin use.       Social History   Social History  . Marital status: Married    Spouse name: N/A  . Number of children: N/A  . Years of education: N/A   Occupational History  . Not on file.   Social History Main Topics  . Smoking status: Current Every Day Smoker    Packs/day: 0.15    Years: 25.00    Types: Cigarettes  . Smokeless tobacco: Never Used  . Alcohol use 0.6 oz/week    1 Glasses of wine per week  . Drug use: No  . Sexual activity: Not on file     Comment: husband vasectomy   Other Topics Concern  . Not on file   Social History Narrative  . No narrative on file      Family History  Problem Relation Age of Onset  . Breast cancer Mother   . Hepatitis C Father     Vitals:   11/03/16 0937  BP: 133/85  Pulse: 76  SpO2: 99%  Weight: (!) 354 lb 4 oz (160.7 kg)    PHYSICAL EXAM: General:  Well appearing. No respiratory difficulty HEENT: normal Neck: supple. no  JVD. Carotids 2+ bilat; no bruits. No lymphadenopathy or thryomegaly appreciated. Cor: PMI nondisplaced. Regular rate & rhythm. No rubs, gallops or murmurs. Right sided port -site healing well  Lungs: clear Abdomen: obese soft, nontender, nondistended. No hepatosplenomegaly. No bruits or masses. Good bowel sounds. Extremities: no cyanosis, clubbing, rash, edema Neuro: alert & oriented x 3, cranial nerves grossly intact. moves all 4 extremities w/o difficulty. Affect pleasant.   ASSESSMENT & PLAN:  1. R breast cancer -  T1b pN0, stage IB invasive ductal carcinoma, grade 3, triple negative, with an MIB-1 of 80%. -- neoadjuvant chemotherapy to consist of doxorubicin and cyclophosphamide in dose dense fashion 4 starting 11/05/2016 followed by weekly paclitaxel 12 --I reviewed echos personally. EF and Doppler parameters stable.  I explained incidence of Adriamycin cardiotoxicity in detail include [place] possibility of very delayed toxicity. Will repeat echo at end of the 2nd cycle of Adriamycin, at end of therapy and in 8-9 months after therapy to ensure stability.   2. Tobacco use --she realizes the need to quit but needs it for stress reduction now  3. HL --followed by PCP  Glori Bickers, MD  10:08 AM

## 2016-11-03 NOTE — Telephone Encounter (Signed)
Faxed completed FMLA forms to [employer] on 10/28/16 fax [phone] and scanned copy to Epic

## 2016-11-03 NOTE — Telephone Encounter (Signed)
Faxed disability papers to [employer] to 10/31/16 to fax [phone]. Attached copy of completed forms in Epic

## 2016-11-03 NOTE — Addendum Note (Signed)
Encounter addended by: Kennieth Rad, RN on: 11/03/2016 10:16 AM<BR>    Actions taken: Order list changed, Diagnosis association updated

## 2016-11-03 NOTE — Addendum Note (Signed)
Encounter addended by: Kennieth Rad, RN on: 11/03/2016 10:13 AM<BR>    Actions taken: Sign clinical note

## 2016-11-03 NOTE — Patient Instructions (Signed)
Echo and Follow up in 6 weeks.

## 2016-11-05 ENCOUNTER — Encounter: Payer: Self-pay | Admitting: *Deleted

## 2016-11-05 ENCOUNTER — Other Ambulatory Visit (HOSPITAL_BASED_OUTPATIENT_CLINIC_OR_DEPARTMENT_OTHER): Payer: Managed Care, Other (non HMO)

## 2016-11-05 ENCOUNTER — Ambulatory Visit (HOSPITAL_BASED_OUTPATIENT_CLINIC_OR_DEPARTMENT_OTHER): Payer: Managed Care, Other (non HMO)

## 2016-11-05 VITALS — BP 130/78 | HR 82 | Temp 98.1°F | Resp 18

## 2016-11-05 DIAGNOSIS — Z171 Estrogen receptor negative status [ER-]: Secondary | ICD-10-CM

## 2016-11-05 DIAGNOSIS — Z5111 Encounter for antineoplastic chemotherapy: Secondary | ICD-10-CM | POA: Diagnosis not present

## 2016-11-05 DIAGNOSIS — C50211 Malignant neoplasm of upper-inner quadrant of right female breast: Secondary | ICD-10-CM

## 2016-11-05 LAB — CBC WITH DIFFERENTIAL/PLATELET
BASO%: 0.9 % (ref 0.0–2.0)
Basophils Absolute: 0.1 10*3/uL (ref 0.0–0.1)
EOS%: 2 % (ref 0.0–7.0)
Eosinophils Absolute: 0.2 10*3/uL (ref 0.0–0.5)
HCT: 39.5 % (ref 34.8–46.6)
HGB: 12.9 g/dL (ref 11.6–15.9)
LYMPH%: 28.5 % (ref 14.0–49.7)
MCH: 24.9 pg — ABNORMAL LOW (ref 25.1–34.0)
MCHC: 32.6 g/dL (ref 31.5–36.0)
MCV: 76.5 fL — ABNORMAL LOW (ref 79.5–101.0)
MONO#: 0.3 10*3/uL (ref 0.1–0.9)
MONO%: 4 % (ref 0.0–14.0)
NEUT#: 5.3 10*3/uL (ref 1.5–6.5)
NEUT%: 64.6 % (ref 38.4–76.8)
Platelets: 255 10*3/uL (ref 145–400)
RBC: 5.16 10*6/uL (ref 3.70–5.45)
RDW: 15.7 % — ABNORMAL HIGH (ref 11.2–14.5)
WBC: 8.3 10*3/uL (ref 3.9–10.3)
lymph#: 2.4 10*3/uL (ref 0.9–3.3)

## 2016-11-05 LAB — COMPREHENSIVE METABOLIC PANEL
ALT: 19 U/L (ref 0–55)
AST: 10 U/L (ref 5–34)
Albumin: 3.5 g/dL (ref 3.5–5.0)
Alkaline Phosphatase: 99 U/L (ref 40–150)
Anion Gap: 10 mEq/L (ref 3–11)
BUN: 11.2 mg/dL (ref 7.0–26.0)
CO2: 23 mEq/L (ref 22–29)
Calcium: 9.7 mg/dL (ref 8.4–10.4)
Chloride: 106 mEq/L (ref 98–109)
Creatinine: 0.8 mg/dL (ref 0.6–1.1)
EGFR: >90 mL/min/{1.73_m2} (ref >90)
Glucose: 164 mg/dl — ABNORMAL HIGH (ref 70–140)
Potassium: 4.4 mEq/L (ref 3.5–5.1)
Sodium: 139 mEq/L (ref 136–145)
Total Bilirubin: 0.39 mg/dL (ref 0.20–1.20)
Total Protein: 6.7 g/dL (ref 6.4–8.3)

## 2016-11-05 MED ORDER — DIPHENHYDRAMINE HCL 50 MG/ML IJ SOLN
INTRAMUSCULAR | Status: AC
  Filled 2016-11-05: qty 1

## 2016-11-05 MED ORDER — DIPHENHYDRAMINE HCL 50 MG/ML IJ SOLN
12.5000 mg | Freq: Once | INTRAMUSCULAR | Status: AC
  Administered 2016-11-05: 12.5 mg via INTRAVENOUS

## 2016-11-05 MED ORDER — HEPARIN SOD (PORK) LOCK FLUSH 100 UNIT/ML IV SOLN
500.0000 [IU] | Freq: Once | INTRAVENOUS | Status: AC | PRN
  Administered 2016-11-05: 500 [IU]
  Filled 2016-11-05: qty 5

## 2016-11-05 MED ORDER — SODIUM CHLORIDE 0.9 % IV SOLN
600.0000 mg/m2 | Freq: Once | INTRAVENOUS | Status: AC
  Administered 2016-11-05: 1740 mg via INTRAVENOUS
  Filled 2016-11-05: qty 87

## 2016-11-05 MED ORDER — PALONOSETRON HCL INJECTION 0.25 MG/5ML
0.2500 mg | Freq: Once | INTRAVENOUS | Status: AC
Start: 2016-11-05 — End: 2016-11-05
  Administered 2016-11-05: 0.25 mg via INTRAVENOUS

## 2016-11-05 MED ORDER — SODIUM CHLORIDE 0.9 % IV SOLN
Freq: Once | INTRAVENOUS | Status: AC
  Administered 2016-11-05: 10:00:00 via INTRAVENOUS

## 2016-11-05 MED ORDER — DOXORUBICIN HCL CHEMO IV INJECTION 2 MG/ML
60.0000 mg/m2 | Freq: Once | INTRAVENOUS | Status: AC
  Administered 2016-11-05: 174 mg via INTRAVENOUS
  Filled 2016-11-05: qty 87

## 2016-11-05 MED ORDER — SODIUM CHLORIDE 0.9 % IV SOLN
Freq: Once | INTRAVENOUS | Status: AC
  Administered 2016-11-05: 10:00:00 via INTRAVENOUS
  Filled 2016-11-05: qty 5

## 2016-11-05 MED ORDER — SODIUM CHLORIDE 0.9% FLUSH
10.0000 mL | INTRAVENOUS | Status: DC | PRN
  Administered 2016-11-05: 10 mL
  Filled 2016-11-05: qty 10

## 2016-11-05 MED ORDER — PALONOSETRON HCL INJECTION 0.25 MG/5ML
INTRAVENOUS | Status: AC
  Filled 2016-11-05: qty 5

## 2016-11-05 NOTE — Patient Instructions (Addendum)
[facility] Discharge Instructions for Patients Receiving Chemotherapy  Today you received the following chemotherapy agents: Adriamycin, Cytoxan   To help prevent nausea and vomiting after your treatment, we encourage you to take your nausea medication as prescribed.   If you develop nausea and vomiting that is not controlled by your nausea medication, call the clinic.   BELOW ARE SYMPTOMS THAT SHOULD BE REPORTED IMMEDIATELY:  *FEVER GREATER THAN 100.5 F  *CHILLS WITH OR WITHOUT FEVER  NAUSEA AND VOMITING THAT IS NOT CONTROLLED WITH YOUR NAUSEA MEDICATION  *UNUSUAL SHORTNESS OF BREATH  *UNUSUAL BRUISING OR BLEEDING  TENDERNESS IN MOUTH AND THROAT WITH OR WITHOUT PRESENCE OF ULCERS  *URINARY PROBLEMS  *BOWEL PROBLEMS  UNUSUAL RASH Items with * indicate a potential emergency and should be followed up as soon as possible.  Feel free to call the clinic you have any questions or concerns. The clinic phone number is [phone].  Please show the [facility] at check-in to the Emergency Department and triage nurse.  Doxorubicin injection What is this medicine? DOXORUBICIN (dox oh ROO bi sin) is a chemotherapy drug. It is used to treat many kinds of cancer like leukemia, lymphoma, neuroblastoma, sarcoma, and Wilms' tumor. It is also used to treat bladder cancer, breast cancer, lung cancer, ovarian cancer, stomach cancer, and thyroid cancer. This medicine may be used for other purposes; ask your health care provider or pharmacist if you have questions. COMMON BRAND NAME(S): Adriamycin, Adriamycin PFS, Adriamycin RDF, Rubex What should I tell my health care provider before I take this medicine? They need to know if you have any of these conditions: -heart disease -history of low blood counts caused by a medicine -liver disease -recent or ongoing radiation therapy -an unusual or allergic reaction to doxorubicin, other chemotherapy agents, other medicines,  foods, dyes, or preservatives -pregnant or trying to get pregnant -breast-feeding How should I use this medicine? This drug is given as an infusion into a vein. It is administered in a hospital or clinic by a specially trained health care professional. If you have pain, swelling, burning or any unusual feeling around the site of your injection, tell your health care professional right away. Talk to your pediatrician regarding the use of this medicine in children. Special care may be needed. Overdosage: If you think you have taken too much of this medicine contact a poison control center or emergency room at once. NOTE: This medicine is only for you. Do not share this medicine with others. What if I miss a dose? It is important not to miss your dose. Call your doctor or health care professional if you are unable to keep an appointment. What may interact with this medicine? This medicine may interact with the following medications: -6-mercaptopurine -paclitaxel -phenytoin -St. John's Wort -trastuzumab -verapamil This list may not describe all possible interactions. Give your health care provider a list of all the medicines, herbs, non-prescription drugs, or dietary supplements you use. Also tell them if you smoke, drink alcohol, or use illegal drugs. Some items may interact with your medicine. What should I watch for while using this medicine? This drug may make you feel generally unwell. This is not uncommon, as chemotherapy can affect healthy cells as well as cancer cells. Report any side effects. Continue your course of treatment even though you feel ill unless your doctor tells you to stop. There is a maximum amount of this medicine you should receive throughout your life. The amount depends on the  medical condition being treated and your overall health. Your doctor will watch how much of this medicine you receive in your lifetime. Tell your doctor if you have taken this medicine before. You  may need blood work done while you are taking this medicine. Your urine may turn red for a few days after your dose. This is not blood. If your urine is dark or brown, call your doctor. In some cases, you may be given additional medicines to help with side effects. Follow all directions for their use. Call your doctor or health care professional for advice if you get a fever, chills or sore throat, or other symptoms of a cold or flu. Do not treat yourself. This drug decreases your body's ability to fight infections. Try to avoid being around people who are sick. This medicine may increase your risk to bruise or bleed. Call your doctor or health care professional if you notice any unusual bleeding. Talk to your doctor about your risk of cancer. You may be more at risk for certain types of cancers if you take this medicine. Do not become pregnant while taking this medicine or for 6 months after stopping it. Women should inform their doctor if they wish to become pregnant or think they might be pregnant. Men should not father a child while taking this medicine and for 6 months after stopping it. There is a potential for serious side effects to an unborn child. Talk to your health care professional or pharmacist for more information. Do not breast-feed an infant while taking this medicine. This medicine has caused ovarian failure in some women and reduced sperm counts in some men This medicine may interfere with the ability to have a child. Talk with your doctor or health care professional if you are concerned about your fertility. What side effects may I notice from receiving this medicine? Side effects that you should report to your doctor or health care professional as soon as possible: -allergic reactions like skin rash, itching or hives, swelling of the face, lips, or tongue -breathing problems -chest pain -fast or irregular heartbeat -low blood counts - this medicine may decrease the number of white  blood cells, red blood cells and platelets. You may be at increased risk for infections and bleeding. -pain, redness, or irritation at site where injected -signs of infection - fever or chills, cough, sore throat, pain or difficulty passing urine -signs of decreased platelets or bleeding - bruising, pinpoint red spots on the skin, black, tarry stools, blood in the urine -swelling of the ankles, feet, hands -tiredness -weakness Side effects that usually do not require medical attention (report to your doctor or health care professional if they continue or are bothersome): -diarrhea -hair loss -mouth sores -nail discoloration or damage -nausea -red colored urine -vomiting This list may not describe all possible side effects. Call your doctor for medical advice about side effects. You may report side effects to FDA at 1-800-FDA-1088. Where should I keep my medicine? This drug is given in a hospital or clinic and will not be stored at home. NOTE: This sheet is a summary. It may not cover all possible information. If you have questions about this medicine, talk to your doctor, pharmacist, or health care provider.  2018 Elsevier/Gold Standard (2015-08-27 11:28:51)  Cyclophosphamide injection What is this medicine? CYCLOPHOSPHAMIDE (sye kloe FOSS fa mide) is a chemotherapy drug. It slows the growth of cancer cells. This medicine is used to treat many types of cancer like lymphoma, myeloma,  leukemia, breast cancer, and ovarian cancer, to name a few. This medicine may be used for other purposes; ask your health care provider or pharmacist if you have questions. COMMON BRAND NAME(S): Cytoxan, Neosar What should I tell my health care provider before I take this medicine? They need to know if you have any of these conditions: -blood disorders -history of other chemotherapy -infection -kidney disease -liver disease -recent or ongoing radiation therapy -tumors in the bone marrow -an unusual or  allergic reaction to cyclophosphamide, other chemotherapy, other medicines, foods, dyes, or preservatives -pregnant or trying to get pregnant -breast-feeding How should I use this medicine? This drug is usually given as an injection into a vein or muscle or by infusion into a vein. It is administered in a hospital or clinic by a specially trained health care professional. Talk to your pediatrician regarding the use of this medicine in children. Special care may be needed. Overdosage: If you think you have taken too much of this medicine contact a poison control center or emergency room at once. NOTE: This medicine is only for you. Do not share this medicine with others. What if I miss a dose? It is important not to miss your dose. Call your doctor or health care professional if you are unable to keep an appointment. What may interact with this medicine? This medicine may interact with the following medications: -amiodarone -amphotericin B -azathioprine -certain antiviral medicines for HIV or AIDS such as protease inhibitors (e.g., indinavir, ritonavir) and zidovudine -certain blood pressure medications such as benazepril, captopril, enalapril, fosinopril, lisinopril, moexipril, monopril, perindopril, quinapril, ramipril, trandolapril -certain cancer medications such as anthracyclines (e.g., daunorubicin, doxorubicin), busulfan, cytarabine, paclitaxel, pentostatin, tamoxifen, trastuzumab -certain diuretics such as chlorothiazide, chlorthalidone, hydrochlorothiazide, indapamide, metolazone -certain medicines that treat or prevent blood clots like warfarin -certain muscle relaxants such as succinylcholine -cyclosporine -etanercept -indomethacin -medicines to increase blood counts like filgrastim, pegfilgrastim, sargramostim -medicines used as general anesthesia -metronidazole -natalizumab This list may not describe all possible interactions. Give your health care provider a list of all the  medicines, herbs, non-prescription drugs, or dietary supplements you use. Also tell them if you smoke, drink alcohol, or use illegal drugs. Some items may interact with your medicine. What should I watch for while using this medicine? Visit your doctor for checks on your progress. This drug may make you feel generally unwell. This is not uncommon, as chemotherapy can affect healthy cells as well as cancer cells. Report any side effects. Continue your course of treatment even though you feel ill unless your doctor tells you to stop. Drink water or other fluids as directed. Urinate often, even at night. In some cases, you may be given additional medicines to help with side effects. Follow all directions for their use. Call your doctor or health care professional for advice if you get a fever, chills or sore throat, or other symptoms of a cold or flu. Do not treat yourself. This drug decreases your body's ability to fight infections. Try to avoid being around people who are sick. This medicine may increase your risk to bruise or bleed. Call your doctor or health care professional if you notice any unusual bleeding. Be careful brushing and flossing your teeth or using a toothpick because you may get an infection or bleed more easily. If you have any dental work done, tell your dentist you are receiving this medicine. You may get drowsy or dizzy. Do not drive, use machinery, or do anything that needs mental alertness until  you know how this medicine affects you. Do not become pregnant while taking this medicine or for 1 year after stopping it. Women should inform their doctor if they wish to become pregnant or think they might be pregnant. Men should not father a child while taking this medicine and for 4 months after stopping it. There is a potential for serious side effects to an unborn child. Talk to your health care professional or pharmacist for more information. Do not breast-feed an infant while taking  this medicine. This medicine may interfere with the ability to have a child. This medicine has caused ovarian failure in some women. This medicine has caused reduced sperm counts in some men. You should talk with your doctor or health care professional if you are concerned about your fertility. If you are going to have surgery, tell your doctor or health care professional that you have taken this medicine. What side effects may I notice from receiving this medicine? Side effects that you should report to your doctor or health care professional as soon as possible: -allergic reactions like skin rash, itching or hives, swelling of the face, lips, or tongue -low blood counts - this medicine may decrease the number of white blood cells, red blood cells and platelets. You may be at increased risk for infections and bleeding. -signs of infection - fever or chills, cough, sore throat, pain or difficulty passing urine -signs of decreased platelets or bleeding - bruising, pinpoint red spots on the skin, black, tarry stools, blood in the urine -signs of decreased red blood cells - unusually weak or tired, fainting spells, lightheadedness -breathing problems -dark urine -dizziness -palpitations -swelling of the ankles, feet, hands -trouble passing urine or change in the amount of urine -weight gain -yellowing of the eyes or skin Side effects that usually do not require medical attention (report to your doctor or health care professional if they continue or are bothersome): -changes in nail or skin color -hair loss -missed menstrual periods -mouth sores -nausea, vomiting This list may not describe all possible side effects. Call your doctor for medical advice about side effects. You may report side effects to FDA at 1-800-FDA-1088. Where should I keep my medicine? This drug is given in a hospital or clinic and will not be stored at home. NOTE: This sheet is a summary. It may not cover all possible  information. If you have questions about this medicine, talk to your doctor, pharmacist, or health care provider.  2018 Elsevier/Gold Standard (2012-05-14 16:22:58)

## 2016-11-05 NOTE — Progress Notes (Signed)
1100 - Blood return noted before, during, and after Adriamycin push. Patient tolerated administration well with no complaints or adverse reactions.

## 2016-11-05 NOTE — Progress Notes (Signed)
Cytoxan administered over 67mins. Patient reported sinus burning at end of flush. Also reported feeling "achy all over". Compared aching to flu-like aching. MD notified, instructed to give 12.5 IV benadryl and 250 NS. Instructed to note that Cytoxan will run over an hour subsequent infusions; notified pharmacy. VSS. Patient educated.  Wylene Simmer, BSN, RN 11/05/2016 12:45 PM

## 2016-11-06 ENCOUNTER — Encounter: Payer: Self-pay | Admitting: Family Medicine

## 2016-11-07 ENCOUNTER — Ambulatory Visit (HOSPITAL_BASED_OUTPATIENT_CLINIC_OR_DEPARTMENT_OTHER): Payer: Managed Care, Other (non HMO)

## 2016-11-07 VITALS — BP 150/72 | HR 80 | Temp 98.7°F | Resp 20

## 2016-11-07 DIAGNOSIS — Z5189 Encounter for other specified aftercare: Secondary | ICD-10-CM | POA: Diagnosis not present

## 2016-11-07 DIAGNOSIS — Z171 Estrogen receptor negative status [ER-]: Secondary | ICD-10-CM

## 2016-11-07 DIAGNOSIS — C50211 Malignant neoplasm of upper-inner quadrant of right female breast: Secondary | ICD-10-CM | POA: Diagnosis not present

## 2016-11-07 MED ORDER — PEGFILGRASTIM INJECTION 6 MG/0.6ML ~~LOC~~
6.0000 mg | PREFILLED_SYRINGE | Freq: Once | SUBCUTANEOUS | Status: AC
  Administered 2016-11-07: 6 mg via SUBCUTANEOUS
  Filled 2016-11-07: qty 0.6

## 2016-11-07 NOTE — Patient Instructions (Signed)
Pegfilgrastim injection What is this medicine? PEGFILGRASTIM (PEG fil gra stim) is a long-acting granulocyte colony-stimulating factor that stimulates the growth of neutrophils, a type of white blood cell important in the body's fight against infection. It is used to reduce the incidence of fever and infection in patients with certain types of cancer who are receiving chemotherapy that affects the bone marrow, and to increase survival after being exposed to high doses of radiation. This medicine may be used for other purposes; ask your health care provider or pharmacist if you have questions. COMMON BRAND NAME(S): Neulasta What should I tell my health care provider before I take this medicine? They need to know if you have any of these conditions: -kidney disease -latex allergy -ongoing radiation therapy -sickle cell disease -skin reactions to acrylic adhesives (On-Body Injector only) -an unusual or allergic reaction to pegfilgrastim, filgrastim, other medicines, foods, dyes, or preservatives -pregnant or trying to get pregnant -breast-feeding How should I use this medicine? This medicine is for injection under the skin. If you get this medicine at home, you will be taught how to prepare and give the pre-filled syringe or how to use the On-body Injector. Refer to the patient Instructions for Use for detailed instructions. Use exactly as directed. Tell your healthcare provider immediately if you suspect that the On-body Injector may not have performed as intended or if you suspect the use of the On-body Injector resulted in a missed or partial dose. It is important that you put your used needles and syringes in a special sharps container. Do not put them in a trash can. If you do not have a sharps container, call your pharmacist or healthcare provider to get one. Talk to your pediatrician regarding the use of this medicine in children. While this drug may be prescribed for selected conditions,  precautions do apply. Overdosage: If you think you have taken too much of this medicine contact a poison control center or emergency room at once. NOTE: This medicine is only for you. Do not share this medicine with others. What if I miss a dose? It is important not to miss your dose. Call your doctor or health care professional if you miss your dose. If you miss a dose due to an On-body Injector failure or leakage, a new dose should be administered as soon as possible using a single prefilled syringe for manual use. What may interact with this medicine? Interactions have not been studied. Give your health care provider a list of all the medicines, herbs, non-prescription drugs, or dietary supplements you use. Also tell them if you smoke, drink alcohol, or use illegal drugs. Some items may interact with your medicine. This list may not describe all possible interactions. Give your health care provider a list of all the medicines, herbs, non-prescription drugs, or dietary supplements you use. Also tell them if you smoke, drink alcohol, or use illegal drugs. Some items may interact with your medicine. What should I watch for while using this medicine? You may need blood work done while you are taking this medicine. If you are going to need a MRI, CT scan, or other procedure, tell your doctor that you are using this medicine (On-Body Injector only). What side effects may I notice from receiving this medicine? Side effects that you should report to your doctor or health care professional as soon as possible: -allergic reactions like skin rash, itching or hives, swelling of the face, lips, or tongue -dizziness -fever -pain, redness, or irritation at site   where injected -pinpoint red spots on the skin -red or dark-brown urine -shortness of breath or breathing problems -stomach or side pain, or pain at the shoulder -swelling -tiredness -trouble passing urine or change in the amount of urine Side  effects that usually do not require medical attention (report to your doctor or health care professional if they continue or are bothersome): -bone pain -muscle pain This list may not describe all possible side effects. Call your doctor for medical advice about side effects. You may report side effects to FDA at 1-800-FDA-1088. Where should I keep my medicine? Keep out of the reach of children. Store pre-filled syringes in a refrigerator between 2 and 8 degrees C (36 and 46 degrees F). Do not freeze. Keep in carton to protect from light. Throw away this medicine if it is left out of the refrigerator for more than 48 hours. Throw away any unused medicine after the expiration date. NOTE: This sheet is a summary. It may not cover all possible information. If you have questions about this medicine, talk to your doctor, pharmacist, or health care provider.  2018 [employer] (2016-06-26 12:58:03)  

## 2016-11-11 ENCOUNTER — Telehealth: Payer: Self-pay

## 2016-11-11 ENCOUNTER — Other Ambulatory Visit: Payer: Self-pay | Admitting: Nurse Practitioner

## 2016-11-11 DIAGNOSIS — K1379 Other lesions of oral mucosa: Secondary | ICD-10-CM

## 2016-11-11 MED ORDER — MAGIC MOUTHWASH: 5.0000 mL | Freq: Four times a day (QID) | ORAL | 3 refills | Status: DC

## 2016-11-11 NOTE — Telephone Encounter (Signed)
Called patient per NP. States her mouth is tender but manageable as long as she keeps it moist with lozenges. Able to eat and drink. She is agreeable to magic mouthwash rx and will be seen in clinic tomorrow. Rn suggested sucking on ice chips during adriamycin infusion next time. Patient verbalizes understanding and thanks for call.

## 2016-11-11 NOTE — Telephone Encounter (Signed)
Pt called with mouth sores and bleeding in mouth. Sores since Saturday, today first day of bleeding when brushed teeth. Looks bright red. No fever. Still able to eat or drink cold or cool foods. Sucking on dry mouth lozenges or it will feel raw.  Pt received [facility] 4/25.  Pt has lab appt tomorrow and Wilber Bihari

## 2016-11-11 NOTE — Telephone Encounter (Signed)
Kelly Mendez, can you call and check on patient?  We can send in some magic mouthwash for her to help until her appointment tomorrow.  This is usually pharmacy specific, so let me know once you talk to her.

## 2016-11-12 ENCOUNTER — Encounter: Payer: Self-pay | Admitting: Adult Health

## 2016-11-12 ENCOUNTER — Other Ambulatory Visit (HOSPITAL_BASED_OUTPATIENT_CLINIC_OR_DEPARTMENT_OTHER): Payer: Managed Care, Other (non HMO)

## 2016-11-12 ENCOUNTER — Ambulatory Visit (HOSPITAL_BASED_OUTPATIENT_CLINIC_OR_DEPARTMENT_OTHER): Payer: Managed Care, Other (non HMO) | Admitting: Adult Health

## 2016-11-12 VITALS — BP 137/82 | HR 77 | Temp 98.3°F | Resp 18 | Ht 74.0 in | Wt 335.4 lb

## 2016-11-12 DIAGNOSIS — Z171 Estrogen receptor negative status [ER-]: Secondary | ICD-10-CM | POA: Diagnosis not present

## 2016-11-12 DIAGNOSIS — C50211 Malignant neoplasm of upper-inner quadrant of right female breast: Secondary | ICD-10-CM

## 2016-11-12 DIAGNOSIS — B370 Candidal stomatitis: Secondary | ICD-10-CM

## 2016-11-12 DIAGNOSIS — E119 Type 2 diabetes mellitus without complications: Secondary | ICD-10-CM | POA: Diagnosis not present

## 2016-11-12 DIAGNOSIS — Z720 Tobacco use: Secondary | ICD-10-CM

## 2016-11-12 LAB — CBC WITH DIFFERENTIAL/PLATELET
BASO%: 0.1 % (ref 0.0–2.0)
Basophils Absolute: 0 10*3/uL (ref 0.0–0.1)
EOS%: 0.8 % (ref 0.0–7.0)
Eosinophils Absolute: 0.1 10*3/uL (ref 0.0–0.5)
HCT: 41.7 % (ref 34.8–46.6)
HGB: 13.8 g/dL (ref 11.6–15.9)
LYMPH%: 4 % — ABNORMAL LOW (ref 14.0–49.7)
MCH: 25.1 pg (ref 25.1–34.0)
MCHC: 33.1 g/dL (ref 31.5–36.0)
MCV: 75.8 fL — ABNORMAL LOW (ref 79.5–101.0)
MONO#: 0.2 10*3/uL (ref 0.1–0.9)
MONO%: 2.4 % (ref 0.0–14.0)
NEUT#: 6.4 10*3/uL (ref 1.5–6.5)
NEUT%: 92.7 % — ABNORMAL HIGH (ref 38.4–76.8)
Platelets: 235 10*3/uL (ref 145–400)
RBC: 5.49 10*6/uL — ABNORMAL HIGH (ref 3.70–5.45)
RDW: 15.1 % — ABNORMAL HIGH (ref 11.2–14.5)
WBC: 6.9 10*3/uL (ref 3.9–10.3)
lymph#: 0.3 10*3/uL — ABNORMAL LOW (ref 0.9–3.3)

## 2016-11-12 LAB — COMPREHENSIVE METABOLIC PANEL
ALT: 21 U/L (ref 0–55)
AST: 8 U/L (ref 5–34)
Albumin: 4 g/dL (ref 3.5–5.0)
Alkaline Phosphatase: 110 U/L (ref 40–150)
Anion Gap: 11 mEq/L (ref 3–11)
BUN: 19.6 mg/dL (ref 7.0–26.0)
CO2: 20 mEq/L — ABNORMAL LOW (ref 22–29)
Calcium: 9.6 mg/dL (ref 8.4–10.4)
Chloride: 102 mEq/L (ref 98–109)
Creatinine: 1 mg/dL (ref 0.6–1.1)
EGFR: 71 mL/min/{1.73_m2} — ABNORMAL LOW (ref >90)
Glucose: 334 mg/dl — ABNORMAL HIGH (ref 70–140)
Potassium: 4.6 mEq/L (ref 3.5–5.1)
Sodium: 134 mEq/L — ABNORMAL LOW (ref 136–145)
Total Bilirubin: 0.5 mg/dL (ref 0.20–1.20)
Total Protein: 7 g/dL (ref 6.4–8.3)

## 2016-11-12 MED ORDER — FLUCONAZOLE 200 MG PO TABS: 200.0000 mg | ORAL_TABLET | Freq: Every day | ORAL | 5 refills | Status: DC

## 2016-11-12 NOTE — Progress Notes (Signed)
[facility]  [phone] [phone]     ID: Kelly Mendez DOB: 10/29/1975  MR#: [id number]  JYN#:[id number]  Patient Care Team: [name], NP-C as PCP - General (Family Medicine) [name], MD as Consulting Physician (General Surgery) Chauncey Cruel, MD as Consulting Physician (Oncology) Eppie Gibson, MD as Attending Physician (Radiation Oncology) Scot Dock, NP OTHER MD:  CHIEF COMPLAINT: Triple negative breast cancer  CURRENT TREATMENT: Neoadjuvant chemotherapy   BREAST CANCER HISTORY: From the original intake note:  Kelly Mendez had her first ever mammogram 10/07/2016 at the [facility]. This showed a possible mass in the right breast. On 10/13/2016 she underwent bilateral diagnostic mammography with tomography and right breast ultrasonography. This found the breast density to be category B. In the upper inner quadrant of the right breast there was a circumscribed mass which was barely palpable. Ultrasonography confirmed a 0.9 cm right breast mass at the 1:00 radiant 18 cm from the nipple ultrasound of the axilla showed 1 indeterminate right axillary lymph node with borderline thickening of the anterior cortex.  On 10/16/2016 the patient underwent biopsy of the right breast mass and the suspicious right axillary lymph node. The right breast mass proved to be an invasive ductal carcinoma, grade 3, estrogen and progesterone receptor negative, with an MIB-1 of 80%, and no HER-2 amplification, the signals ratio being 1.30 and the number per cell 1.75. The lymph node was negative and concordant.  Her subsequent history is as detailed below  INTERVAL HISTORY: Kelly Mendez returns today for follow-up of her triple negative breast cancer accompanied by her mother. She is here after receiving cycle 1 of neoadjuvant chemotherapy with Doxorubicin and Cyclophosphamide.  She tolerated chemotherapy relatively well.  She did develop some mouth sores  yesterday and magic mouthwash was sent into her pharmacy.  She also has had some nausea, no vomiting and is taking Compazine around the clock.    Kelly Mendez has diabetes and prior to chemotherapy her blood sugars ran in the 150s.  However, since receiving chemotherapy and receiving Dexamethasone IV in addition to taking Dexamethasone BID since the day following chemotherapy.  She did not stop taking after a couple of days and is in fact still taking Dexamethasone.  She did eat a larger serving of banana pudding over the weekend for her duaghters birthday celebration.  She is a fairly recent diabetes diagnosis, and she takes metformin 1043m bid and tolerates it well.  REVIEW OF SYSTEMS: Otherwise a detailed review of systems was conducted and is non contributory except what is noted above.  PAST MEDICAL HISTORY: Past Medical History:  Diagnosis Date  . Abnormal Pap smear of cervix   . Anxiety   . Asthma   . [place] ([facility]   . Concussion    around age 41 . Depression   . Diabetes type 2, uncontrolled ([facility]    new diagnosis in 08/2016  . HPV in female   . Hyperlipemia   . PTSD (post-traumatic stress disorder)     PAST SURGICAL HISTORY: Past Surgical History:  Procedure Laterality Date  . COLPOSCOPY    . left ovary removed  1978  . PORTACATH PLACEMENT Right 10/29/2016   Procedure: INSERTION PORT-A-CATH WITH [place]  Surgeon: TErroll Luna MD;  Location: [facility]  Service: General;  Laterality: Right;    FAMILY HISTORY Family History  Problem Relation Age of Onset  . Breast cancer Mother   . Hepatitis C Father   The patient's mother was diagnosed with  breast cancer at age 22, but tells me the lump in her breast had been present for at least 10 years prior to that. She is now 1 and doing well. The patient's father died at the age of 64 from sepsis following liver transplantation. The patient has 2 brothers, no sisters. The patient tells me that she has at least 5 and sent cousins with breast  cancer and there are other relatives with uterine cancer.  GYNECOLOGIC HISTORY:  Patient's last menstrual period was 10/27/2016. Menarche age 27, first live birth age 17, the patient is GX P1. She has had multiple progesterone and oral contraceptive treatments through [facility] because of her menometrorrhagia. 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 Her husband Kelly Mendez is a truck Geophysicist/field seismologist. The patient's daughter Kelly Mendez is studying pre-veterinary medicine in college. Kelly Mendez has 3 children, all boys, a keen is a cook in [place] and lives independently. The 2 younger boys, Kelly Mendez and Kelly Mendez, aged 6 and 78 as of April 2018, are at home with the patient    ADVANCED DIRECTIVES: Not in place   HEALTH MAINTENANCE: Social History  Substance Use Topics  . Smoking status: Current Every Day Smoker    Packs/day: 0.15    Years: 25.00    Types: Cigarettes  . Smokeless tobacco: Never Used  . Alcohol use 0.6 oz/week    1 Glasses of wine per week     Colonoscopy: n/a  PAP:  Bone density:   Allergies  Allergen Reactions  . Penicillins Other (See Comments)    As a child Has patient had a PCN reaction causing immediate rash, facial/tongue/throat swelling, SOB or lightheadedness with hypotension: unknown Has patient had a PCN reaction causing severe rash involving mucus membranes or skin necrosis: unknown Has patient had a PCN reaction that required hospitalization unknown Has patient had a PCN reaction occurring within the last 10 years: no If all of the above answers are "NO", then may proceed with Cephalosporin use.     Current Outpatient Prescriptions  Medication Sig Dispense Refill  . albuterol (PROVENTIL HFA;VENTOLIN HFA) 108 (90 Base) MCG/ACT inhaler Inhale 1-2 puffs into the lungs every 6 (six) hours as needed for wheezing or shortness of breath.    Marland Kitchen atorvastatin (LIPITOR) 20 MG tablet Take 1  tablet (20 mg total) by mouth daily. 30 tablet 1  . dexamethasone (DECADRON) 4 MG tablet Take 2 tablets by mouth once a day on the day after chemotherapy and then take 2 tablets two times a day for 2 days. Take with food. 30 tablet 1  . Glucose Blood (BLOOD GLUCOSE TEST STRIPS) STRP Test twice a day. Pt uses one touch verio flex meter 100 each 5  . ibuprofen (ADVIL,MOTRIN) 200 MG tablet Take 400 mg by mouth daily as needed for headache.    . lidocaine-prilocaine (EMLA) cream Apply to affected area once 30 g 3  . LORazepam (ATIVAN) 0.5 MG tablet Take 1 tablet (0.5 mg total) by mouth at bedtime as needed (Nausea or vomiting). 30 tablet 0  . magic mouthwash SOLN Take 5 mLs by mouth 4 (four) times daily. 240 mL 3  . medroxyPROGESTERone (DEPO-PROVERA) 150 MG/ML injection Inject 150 mg into the muscle every 3 (three) months. Last dose January 2018    . Melatonin 10 MG TABS Take 10 mg by mouth at bedtime.    . metFORMIN (GLUCOPHAGE) 1000 MG tablet Take 1 tablet (1,000 mg total)  by mouth 2 (two) times daily with a meal. 60 tablet 2  . ONETOUCH DELICA LANCETS FINE MISC Test twice a day 100 each 5  . oxyCODONE (OXY IR/ROXICODONE) 5 MG immediate release tablet Take 1-2 tablets (5-10 mg total) by mouth every 6 (six) hours as needed for severe pain. 25 tablet 0  . prochlorperazine (COMPAZINE) 10 MG tablet Take 1 tablet (10 mg total) by mouth every 6 (six) hours as needed (Nausea or vomiting). 30 tablet 1   No current facility-administered medications for this visit.     OBJECTIVE:  Vitals:   11/12/16 1011  BP: 137/82  Pulse: 77  Resp: 18  Temp: 98.3 F (36.8 C)     Body mass index is 43.06 kg/m.    ECOG FS:1 - Symptomatic but completely ambulatory GENERAL: Patient is a well appearing female in no acute distress HEENT:  Sclerae anicteric.  Oropharynx moist, a couple of ulceration noted on upper palate, and thrush noted on posterior pharynx NODES:  No cervical, supraclavicular, or axillary  lymphadenopathy palpated.  BREAST EXAM:  Deferred. LUNGS:  Clear to auscultation bilaterally.  No wheezes or rhonchi. HEART:  Regular rate and rhythm. No murmur appreciated. ABDOMEN:  Soft, nontender.  Positive, normoactive bowel sounds. No organomegaly palpated. MSK:  No focal spinal tenderness to palpation. Full range of motion bilaterally in the upper extremities. EXTREMITIES:  No peripheral edema.   SKIN:  Clear with no obvious rashes or skin changes. No nail dyscrasia. NEURO:  Nonfocal. Well oriented.  Appropriate affect.    LAB RESULTS:  CMP     Component Value Date/Time   NA 139 11/05/2016 0901   K 4.4 11/05/2016 0901   CL 105 10/29/2016 0710   CO2 23 11/05/2016 0901   GLUCOSE 164 (H) 11/05/2016 0901   BUN 11.2 11/05/2016 0901   CREATININE 0.8 11/05/2016 0901   CALCIUM 9.7 11/05/2016 0901   PROT 6.7 11/05/2016 0901   ALBUMIN 3.5 11/05/2016 0901   AST 10 11/05/2016 0901   ALT 19 11/05/2016 0901   ALKPHOS 99 11/05/2016 0901   BILITOT 0.39 11/05/2016 0901   GFRNONAA >60 10/29/2016 0710   GFRAA >60 10/29/2016 0710    No results found for: TOTALPROTELP, ALBUMINELP, A1GS, A2GS, BETS, BETA2SER, GAMS, MSPIKE, SPEI  No results found for: Nils Pyle, [facility]  Lab Results  Component Value Date   WBC 6.9 11/12/2016   NEUTROABS 6.4 11/12/2016   HGB 13.8 11/12/2016   HCT 41.7 11/12/2016   MCV 75.8 (L) 11/12/2016   PLT 235 11/12/2016      Chemistry      Component Value Date/Time   NA 139 11/05/2016 0901   K 4.4 11/05/2016 0901   CL 105 10/29/2016 0710   CO2 23 11/05/2016 0901   BUN 11.2 11/05/2016 0901   CREATININE 0.8 11/05/2016 0901      Component Value Date/Time   CALCIUM 9.7 11/05/2016 0901   ALKPHOS 99 11/05/2016 0901   AST 10 11/05/2016 0901   ALT 19 11/05/2016 0901   BILITOT 0.39 11/05/2016 0901       No results found for: LABCA2  No components found for: [id number]  No results for input(s): INR in the last 168  hours.  Urinalysis    Component Value Date/Time   BILIRUBINUR n 09/29/2016 1045   PROTEINUR trace 09/29/2016 1045   UROBILINOGEN negative 09/29/2016 1045   NITRITE n 09/29/2016 1045   LEUKOCYTESUR Negative 09/29/2016 1045     STUDIES: Mr Breast Bilateral W Lottie Dawson  Contrast  Result Date: 10/29/2016 CLINICAL DATA:  41 year old female with new diagnosis of right breast grade 3 invasive ductal carcinoma. The patient is due to start chemotherapy in 1 week. The patient's mother was diagnosed with breast cancer diagnosed at age 20. LABS:  Creatinine was obtained on site at [facility] at [address] Results: Creatinine 0.7 mg/dL and GFR of 112. EXAM: BILATERAL BREAST MRI WITH AND WITHOUT CONTRAST TECHNIQUE: Multiplanar, multisequence MR images of both breasts were obtained prior to and following the intravenous administration of 19 ml of MultiHance. THREE-DIMENSIONAL MR IMAGE RENDERING ON INDEPENDENT WORKSTATION: Three-dimensional MR images were rendered by post-processing of the original MR data on an independent workstation. The three-dimensional MR images were interpreted, and findings are reported in the following complete MRI report for this study. Three dimensional images were evaluated at the independent DynaCad workstation COMPARISON:  No prior MRI available for comparison. FINDINGS: Breast composition: b. Scattered fibroglandular tissue. Background parenchymal enhancement: Minimal. Right breast: In the upper inner quadrant of the right breast, posterior depth there is an enhancing mass with an internal focus of susceptibility consistent with the biopsy-proven site of invasive disease which measures 1.2 x 1.0 x 1.0 cm. No other sites of disease are identified in the right breast. Left breast: No mass or abnormal enhancement. Lymph nodes: Linear enhancement extending from 1 of the right axillary lymph nodes to the skin surface likely corresponds with biopsy changes from the recent benign  biopsy. No abnormal appearing lymph nodes. Ancillary findings:  None. IMPRESSION: 1. Biopsy proven invasive breast cancer in the upper-inner quadrant of the right breast measures 1.2 cm. No evidence of disease elsewhere in the right breast. 2.  No MRI evidence of left breast malignancy. RECOMMENDATION: Continue treatment plan. BI-RADS CATEGORY  6: Known biopsy-proven malignancy. Electronically Signed   By: Ammie Ferrier M.D.   On: 10/29/2016 16:58   Dg Chest Port 1 View  Result Date: 10/29/2016 CLINICAL DATA:  Status post port placement EXAM: PORTABLE CHEST 1 VIEW COMPARISON:  05/06/2013 FINDINGS: Cardiac shadow is at the upper limits of normal in size. A new right chest wall port is noted with the catheter tip in the proximal superior vena cava just below the level of the azygos vein. Lungs are clear. No pneumothorax is noted. No bony abnormality is seen. IMPRESSION: Right chest wall port with catheter tip as described. Electronically Signed   By: Inez Catalina M.D.   On: 10/29/2016 10:08   Dg Fluoro Guide Cv Line-no Report  Result Date: 10/29/2016 Fluoroscopy was utilized by the requesting physician.  No radiographic interpretation.   US Breast Ltd Uni Right Inc Axilla  Result Date: 10/13/2016 CLINICAL DATA:  Right breast slightly upper inner quadrant focal asymmetry seen on recent screening mammography. EXAM: 2D DIGITAL DIAGNOSTIC RIGHT MAMMOGRAM WITH CAD AND ADJUNCT TOMO ULTRASOUND RIGHT BREAST COMPARISON:  Previous exam(s). ACR Breast Density Category b: There are scattered areas of fibroglandular density. FINDINGS: Additional mammographic views of the right breast demonstrate persistent circumscribed mass in the slightly upper inner right breast, posterior depth, measuring 9 mm mammographically. Mammographic images were processed with CAD. On physical exam, there is a subtle palpable approximately 1 cm mass in the right breast upper inner quadrant, posterior depth. Targeted ultrasound is  performed, showing right breast 1 o'clock 18 cm from the nipple hypoechoic lobulated mass measuring 0.9 x 0.6 x 0.8 cm. Wide hyperechoic halo is seen surrounding the mass. Ultrasound evaluation of the right axilla demonstrates 1 indeterminate low  right axillary lymph node with borderline thickening of its anterior cortex. IMPRESSION: 9 mm right breast 1 o'clock mass, for which ultrasound-guided core needle biopsy is recommended. 1 indeterminate low right axillary lymph node, for which ultrasound-guided core needle biopsy is recommended. RECOMMENDATION: Ultrasound-guided core needle biopsy of right breast and right axilla. I have discussed the findings and recommendations with the patient. Results were also provided in writing at the conclusion of the visit. If applicable, a reminder letter will be sent to the patient regarding the next appointment. BI-RADS CATEGORY  4: Suspicious. Electronically Signed   By: Fidela Salisbury M.D.   On: 10/13/2016 15:13   Mm Diag Breast Tomo Uni Right  Result Date: 10/13/2016 CLINICAL DATA:  Right breast slightly upper inner quadrant focal asymmetry seen on recent screening mammography. EXAM: 2D DIGITAL DIAGNOSTIC RIGHT MAMMOGRAM WITH CAD AND ADJUNCT TOMO ULTRASOUND RIGHT BREAST COMPARISON:  Previous exam(s). ACR Breast Density Category b: There are scattered areas of fibroglandular density. FINDINGS: Additional mammographic views of the right breast demonstrate persistent circumscribed mass in the slightly upper inner right breast, posterior depth, measuring 9 mm mammographically. Mammographic images were processed with CAD. On physical exam, there is a subtle palpable approximately 1 cm mass in the right breast upper inner quadrant, posterior depth. Targeted ultrasound is performed, showing right breast 1 o'clock 18 cm from the nipple hypoechoic lobulated mass measuring 0.9 x 0.6 x 0.8 cm. Wide hyperechoic halo is seen surrounding the mass. Ultrasound evaluation of the right  axilla demonstrates 1 indeterminate low right axillary lymph node with borderline thickening of its anterior cortex. IMPRESSION: 9 mm right breast 1 o'clock mass, for which ultrasound-guided core needle biopsy is recommended. 1 indeterminate low right axillary lymph node, for which ultrasound-guided core needle biopsy is recommended. RECOMMENDATION: Ultrasound-guided core needle biopsy of right breast and right axilla. I have discussed the findings and recommendations with the patient. Results were also provided in writing at the conclusion of the visit. If applicable, a reminder letter will be sent to the patient regarding the next appointment. BI-RADS CATEGORY  4: Suspicious. Electronically Signed   By: Fidela Salisbury M.D.   On: 10/13/2016 15:13   [place] Axillary Node Core Biopsy Right  Addendum Date: 10/17/2016   ADDENDUM REPORT: 10/17/2016 11:26 ADDENDUM: Pathology revealed grade III invasive ductal carcinoma in the right breast and no evidence of carcinoma in the right axillary lymph node. This was found to be concordant by Dr. Claudie Revering. Pathology results were discussed with the patient by telephone. The patient reported doing well after the biopsies with tenderness at the sites. Post biopsy instructions and care were reviewed and questions were answered. The patient was encouraged to call [facility] for any additional concerns. The patient was referred to the [facility] at the [facility] on October 22, 2016. Pathology results reported by Susa Raring RN, BSN on 10/17/2016. Electronically Signed   By: Claudie Revering M.D.   On: 10/17/2016 11:26   Result Date: 10/17/2016 CLINICAL DATA:  Borderline abnormal right axillary lymph node at recent ultrasound. EXAM: ULTRASOUND GUIDED CORE NEEDLE BIOPSY OF A RIGHT AXILLARY NODE COMPARISON:  Previous exam(s). FINDINGS: I met with the patient and we discussed the procedure of ultrasound-guided  biopsy, including benefits and alternatives. We discussed the high likelihood of a successful procedure. We discussed the risks of the procedure, including infection, bleeding, tissue injury, clip migration, and inadequate sampling. Informed written consent was given. The usual  time-out protocol was performed immediately prior to the procedure. Using sterile technique and 1% Lidocaine as local anesthetic, under direct ultrasound visualization, a 14 gauge spring-loaded device was used to perform biopsy of the recently demonstrated borderline abnormal right axillary lymph node using a medial approach. At the conclusion of the procedure a HydroMARK tissue marker clip was deployed into the biopsy cavity. Follow up 2 view mammogram was performed and dictated separately. IMPRESSION: Ultrasound guided biopsy of the recently demonstrated borderline abnormal right axillary lymph node. No apparent complications. Electronically Signed: By: Claudie Revering M.D. On: 10/16/2016 15:03   Mm Clip Placement Right  Result Date: 10/16/2016 CLINICAL DATA:  Status post ultrasound-guided core needle biopsies of a 9 mm mass in the 1 o'clock position of the right breast and a borderline abnormal right axillary lymph node. EXAM: DIAGNOSTIC RIGHT MAMMOGRAM POST ULTRASOUND BIOPSY COMPARISON:  Previous exam(s). FINDINGS: Mammographic images were obtained following ultrasound guided biopsy of a 9 mm mass in the 1 o'clock position of the right breast and a borderline abnormal right axillary lymph node. These demonstrate a ribbon shaped biopsy marker clip within the biopsied breast mass and a HydroMARK biopsy marker clip within the biopsied lymph node. IMPRESSION: Appropriate deployment of the biopsy marker clips following right breast and right axillary lymph node biopsies. Final Assessment: Post Procedure Mammograms for Marker Placement Electronically Signed   By: Claudie Revering M.D.   On: 10/16/2016 15:23   [place] Rt Breast Bx W Loc Dev 1st Lesion  Img Bx Spec US Guide  Addendum Date: 10/17/2016   ADDENDUM REPORT: 10/17/2016 11:26 ADDENDUM: Pathology revealed grade III invasive ductal carcinoma in the right breast and no evidence of carcinoma in the right axillary lymph node. This was found to be concordant by Dr. Claudie Revering. Pathology results were discussed with the patient by telephone. The patient reported doing well after the biopsies with tenderness at the sites. Post biopsy instructions and care were reviewed and questions were answered. The patient was encouraged to call [facility] for any additional concerns. The patient was referred to the [facility] at the [facility] on October 22, 2016. Pathology results reported by Susa Raring RN, BSN on 10/17/2016. Electronically Signed   By: Claudie Revering M.D.   On: 10/17/2016 11:26   Result Date: 10/17/2016 CLINICAL DATA:  9 mm mass in the 1 o'clock position of the right breast. EXAM: ULTRASOUND GUIDED RIGHT BREAST CORE NEEDLE BIOPSY COMPARISON:  Previous exam(s). FINDINGS: I met with the patient and we discussed the procedure of ultrasound-guided biopsy, including benefits and alternatives. We discussed the high likelihood of a successful procedure. We discussed the risks of the procedure, including infection, bleeding, tissue injury, clip migration, and inadequate sampling. Informed written consent was given. The usual time-out protocol was performed immediately prior to the procedure. Lesion quadrant: Right upper inner quadrant Using sterile technique and 1% Lidocaine as local anesthetic, under direct ultrasound visualization, a 12 gauge spring-loaded device was used to perform biopsy of the recently demonstrated 9 mm mass in the 1 o'clock position of the right breast using a caudal approach. At the conclusion of the procedure a ribbon shaped tissue marker clip was deployed into the biopsy cavity. Follow up 2 view mammogram was  performed and dictated separately. IMPRESSION: Ultrasound guided biopsy of a 9 mm mass in the 1 o'clock position of the right breast. No apparent complications. Electronically Signed: By: Claudie Revering M.D. On: 10/16/2016  14:58    ELIGIBLE FOR AVAILABLE RESEARCH PROTOCOL: not a candidate for PREVENT  ASSESSMENT: 41 y.o.  [place] woman status post right breast upper inner quadrant biopsy 10/16/2016 for a clinical T1b pN0, stage IB invasive ductal carcinoma, grade 3, triple negative, with an MIB-1 of 80%.  (1)  genetics testing scheduled for 12/01/2016  (2) neoadjuvant chemotherapy to consist of doxorubicin and cyclophosphamide in dose dense fashion 4 starting 11/05/2016 followed by weekly paclitaxel 12  (3) breast conserving surgery to follow  (4) adjuvant radiation to follow chemotherapy     PLAN:  Fran is doing well following chemotherapy.  Her CBC is stable and I reviewed this with her and her mother.  She will use the magic mouthwash for thrush, but additionally I sent in Fluconazole for a few days for her to take.  I instructed her to stop the Dexamtheasone and continue to monitor her blood sugars closely.  She may need low dose Lantus on days she takes her Dexamethasone, or ? Adding Glipizide low dose if unable to titrate dose down.  I reviewed the above with her and her mother in detail.  She verbalized understanding.  I also discussed it with our breast navigator, Bary Castilla RN who will call the patient to check on her later this week.    Audryanna will return in 1 week for labs, evaluation by Dr. Jana Hakim, and cycle 2 of neoadjuvant Doxorubicin and Cyclophosphamide.    A total of (30) minutes of face-to-face time was spent with this patient with greater than 50% of that time in counseling and care-coordination.     Scot Dock, NP   11/12/2016 10:22 AM Medical Oncology and Hematology [facility] [address] [place], [place] [address] Tel. [phone]     Fax. [phone]

## 2016-11-19 ENCOUNTER — Other Ambulatory Visit (HOSPITAL_BASED_OUTPATIENT_CLINIC_OR_DEPARTMENT_OTHER): Payer: Managed Care, Other (non HMO)

## 2016-11-19 ENCOUNTER — Ambulatory Visit (HOSPITAL_BASED_OUTPATIENT_CLINIC_OR_DEPARTMENT_OTHER): Payer: Managed Care, Other (non HMO) | Admitting: Oncology

## 2016-11-19 ENCOUNTER — Ambulatory Visit (HOSPITAL_BASED_OUTPATIENT_CLINIC_OR_DEPARTMENT_OTHER): Payer: Managed Care, Other (non HMO)

## 2016-11-19 VITALS — BP 133/89 | HR 71 | Resp 18

## 2016-11-19 VITALS — BP 120/82 | HR 84 | Temp 98.6°F | Ht 74.0 in | Wt 339.0 lb

## 2016-11-19 DIAGNOSIS — Z171 Estrogen receptor negative status [ER-]: Secondary | ICD-10-CM

## 2016-11-19 DIAGNOSIS — C50211 Malignant neoplasm of upper-inner quadrant of right female breast: Secondary | ICD-10-CM

## 2016-11-19 DIAGNOSIS — Z720 Tobacco use: Secondary | ICD-10-CM

## 2016-11-19 DIAGNOSIS — K1379 Other lesions of oral mucosa: Secondary | ICD-10-CM | POA: Diagnosis not present

## 2016-11-19 DIAGNOSIS — Z452 Encounter for adjustment and management of vascular access device: Secondary | ICD-10-CM | POA: Diagnosis not present

## 2016-11-19 DIAGNOSIS — Z5111 Encounter for antineoplastic chemotherapy: Secondary | ICD-10-CM | POA: Diagnosis not present

## 2016-11-19 DIAGNOSIS — B370 Candidal stomatitis: Secondary | ICD-10-CM

## 2016-11-19 DIAGNOSIS — R410 Disorientation, unspecified: Secondary | ICD-10-CM

## 2016-11-19 LAB — CBC WITH DIFFERENTIAL/PLATELET
BASO%: 0.4 % (ref 0.0–2.0)
Basophils Absolute: 0 10*3/uL (ref 0.0–0.1)
EOS%: 0.2 % (ref 0.0–7.0)
Eosinophils Absolute: 0 10*3/uL (ref 0.0–0.5)
HCT: 40.5 % (ref 34.8–46.6)
HGB: 13.3 g/dL (ref 11.6–15.9)
LYMPH%: 14.8 % (ref 14.0–49.7)
MCH: 25.1 pg (ref 25.1–34.0)
MCHC: 32.8 g/dL (ref 31.5–36.0)
MCV: 76.6 fL — ABNORMAL LOW (ref 79.5–101.0)
MONO#: 0.7 10*3/uL (ref 0.1–0.9)
MONO%: 6.3 % (ref 0.0–14.0)
NEUT#: 8.1 10*3/uL — ABNORMAL HIGH (ref 1.5–6.5)
NEUT%: 78.3 % — ABNORMAL HIGH (ref 38.4–76.8)
Platelets: 157 10*3/uL (ref 145–400)
RBC: 5.28 10*6/uL (ref 3.70–5.45)
RDW: 15.2 % — ABNORMAL HIGH (ref 11.2–14.5)
WBC: 10.3 10*3/uL (ref 3.9–10.3)
lymph#: 1.5 10*3/uL (ref 0.9–3.3)

## 2016-11-19 LAB — COMPREHENSIVE METABOLIC PANEL
ALT: 32 U/L (ref 0–55)
AST: 15 U/L (ref 5–34)
Albumin: 3.7 g/dL (ref 3.5–5.0)
Alkaline Phosphatase: 117 U/L (ref 40–150)
Anion Gap: 9 mEq/L (ref 3–11)
BUN: 9.7 mg/dL (ref 7.0–26.0)
CO2: 23 mEq/L (ref 22–29)
Calcium: 9.1 mg/dL (ref 8.4–10.4)
Chloride: 105 mEq/L (ref 98–109)
Creatinine: 0.8 mg/dL (ref 0.6–1.1)
EGFR: >90 mL/min/{1.73_m2} (ref >90)
Glucose: 187 mg/dl — ABNORMAL HIGH (ref 70–140)
Potassium: 4.1 mEq/L (ref 3.5–5.1)
Sodium: 137 mEq/L (ref 136–145)
Total Bilirubin: 0.33 mg/dL (ref 0.20–1.20)
Total Protein: 6.5 g/dL (ref 6.4–8.3)

## 2016-11-19 MED ORDER — HEPARIN SOD (PORK) LOCK FLUSH 100 UNIT/ML IV SOLN
500.0000 [IU] | Freq: Once | INTRAVENOUS | Status: AC | PRN
  Administered 2016-11-19: 500 [IU]
  Filled 2016-11-19: qty 5

## 2016-11-19 MED ORDER — DOXORUBICIN HCL CHEMO IV INJECTION 2 MG/ML
60.0000 mg/m2 | Freq: Once | INTRAVENOUS | Status: AC
  Administered 2016-11-19: 174 mg via INTRAVENOUS
  Filled 2016-11-19: qty 87

## 2016-11-19 MED ORDER — MAGIC MOUTHWASH: 5.0000 mL | Freq: Four times a day (QID) | ORAL | 3 refills | Status: DC

## 2016-11-19 MED ORDER — PALONOSETRON HCL INJECTION 0.25 MG/5ML
INTRAVENOUS | Status: AC
  Filled 2016-11-19: qty 5

## 2016-11-19 MED ORDER — SODIUM CHLORIDE 0.9% FLUSH
10.0000 mL | INTRAVENOUS | Status: DC | PRN
  Administered 2016-11-19: 10 mL
  Filled 2016-11-19: qty 10

## 2016-11-19 MED ORDER — VALACYCLOVIR HCL 1 G PO TABS: 1000.0000 mg | ORAL_TABLET | Freq: Every day | ORAL | 0 refills | Status: DC

## 2016-11-19 MED ORDER — ONDANSETRON HCL 8 MG PO TABS: 8.0000 mg | ORAL_TABLET | Freq: Three times a day (TID) | ORAL | 0 refills | Status: DC | PRN

## 2016-11-19 MED ORDER — ALTEPLASE 2 MG IJ SOLR
2.0000 mg | Freq: Once | INTRAMUSCULAR | Status: AC | PRN
  Administered 2016-11-19: 2 mg
  Filled 2016-11-19: qty 2

## 2016-11-19 MED ORDER — PROCHLORPERAZINE MALEATE 10 MG PO TABS: 10.0000 mg | ORAL_TABLET | Freq: Four times a day (QID) | ORAL | 1 refills | Status: DC | PRN

## 2016-11-19 MED ORDER — PALONOSETRON HCL INJECTION 0.25 MG/5ML
0.2500 mg | Freq: Once | INTRAVENOUS | Status: AC
  Administered 2016-11-19: 0.25 mg via INTRAVENOUS

## 2016-11-19 MED ORDER — SODIUM CHLORIDE 0.9 % IV SOLN
Freq: Once | INTRAVENOUS | Status: AC
  Administered 2016-11-19: 12:00:00 via INTRAVENOUS

## 2016-11-19 MED ORDER — SODIUM CHLORIDE 0.9 % IV SOLN
Freq: Once | INTRAVENOUS | Status: AC
  Administered 2016-11-19: 12:00:00 via INTRAVENOUS
  Filled 2016-11-19: qty 5

## 2016-11-19 MED ORDER — FLUCONAZOLE 200 MG PO TABS: 200.0000 mg | ORAL_TABLET | Freq: Every day | ORAL | 5 refills | Status: DC

## 2016-11-19 MED ORDER — SODIUM CHLORIDE 0.9 % IV SOLN
600.0000 mg/m2 | Freq: Once | INTRAVENOUS | Status: AC
  Administered 2016-11-19: 1740 mg via INTRAVENOUS
  Filled 2016-11-19: qty 87

## 2016-11-19 NOTE — Progress Notes (Signed)
[place]  [phone]) [phone] Fax:(336) [phone]     ID: Kelly Mendez DOB: 30-Dec-1975  MR#: [id number]  SHF#:[id number]  Patient Care Team: [name], NP-C as PCP - General (Family Medicine) [name], MD as Consulting Physician (General Surgery) Magrinat, Virgie Dad, MD as Consulting Physician (Oncology) Eppie Gibson, MD as Attending Physician (Radiation Oncology) Chauncey Cruel, MD OTHER MD:  CHIEF COMPLAINT: Triple negative breast cancer  CURRENT TREATMENT: Neoadjuvant chemotherapy   BREAST CANCER HISTORY: From the original intake note:  Kelly Mendez had her first ever mammogram 10/07/2016 at the [facility]. This showed a possible mass in the right breast. On 10/13/2016 she underwent bilateral diagnostic mammography with tomography and right breast ultrasonography. This found the breast density to be category B. In the upper inner quadrant of the right breast there was a circumscribed mass which was barely palpable. Ultrasonography confirmed a 0.9 cm right breast mass at the 1:00 radiant 18 cm from the nipple ultrasound of the axilla showed 1 indeterminate right axillary lymph node with borderline thickening of the anterior cortex.  On 10/16/2016 the patient underwent biopsy of the right breast mass and the suspicious right axillary lymph node. The right breast mass proved to be an invasive ductal carcinoma, grade 3, estrogen and progesterone receptor negative, with an MIB-1 of 80%, and no HER-2 amplification, the signals ratio being 1.30 and the number per cell 1.75. The lymph node was negative and concordant.  Her subsequent history is as detailed below  INTERVAL HISTORY: Kelly Mendez returns today for follow-up of her triple negative breast cancer accompanied by her mother. Today is day 1 cycle 2 of 4 planned cycles of cyclophosphamide and doxorubicin which she receives every 14 days. This will be followed by Abraxane, not Taxol, since the patient was  not able to tolerate dexamethasone  In fact her blood sugars rose to greater than 350 with the dexamethasone. We had to stop that medication. Thankfully her nausea was well-controlled with cycle 1.   REVIEW OF SYSTEMS: She had some mouth sores. She improved with Diflucan and magic mouthwash. She still has a little soreness at the base of her throat, beyond were we can level. She has had no reflux problems. She felt very dizzy headache, unable to function or focus, just mentally clumsy. She was able to walk perhaps 10 steps at a time. She has had a little bit of discomfort in her right neck area, which is where her port S. She denies any fevers, swelling, or redness in that area. She had diarrhea 1, easily controlled with Imodium which she took only 1 time. She feels anxious but less so than prior. She is not depressed. Aside from this detailed review of systems today was stable  PAST MEDICAL HISTORY: Past Medical History:  Diagnosis Date  . Abnormal Pap smear of cervix   . Anxiety   . Asthma   . Cancer ([place])   . Concussion    around age 73  . Depression   . Diabetes type 2, uncontrolled ([place])    new diagnosis in 08/2016  . HPV in female   . Hyperlipemia   . PTSD (post-traumatic stress disorder)     PAST SURGICAL HISTORY: Past Surgical History:  Procedure Laterality Date  . COLPOSCOPY    . left ovary removed  1978  . PORTACATH PLACEMENT Right 10/29/2016   Procedure: INSERTION PORT-A-CATH WITH [place];  Surgeon: [name], MD;  Location: [facility];  Service: General;  Laterality: Right;  FAMILY HISTORY Family History  Problem Relation Age of Onset  . Breast cancer Mother   . Hepatitis C Father   The patient's mother was diagnosed with breast cancer at age 57, but tells me the lump in her breast had been present for at least 10 years prior to that. She is now 80 and doing well. The patient's father died at the age of 22 from sepsis following liver transplantation. The patient has 2  brothers, no sisters. The patient tells me that she has at least 5 and sent cousins with breast cancer and there are other relatives with uterine cancer.  GYNECOLOGIC HISTORY:  Patient's last menstrual period was 10/27/2016. Menarche age 32, first live birth age 42, the patient is GX P1. She has had multiple progesterone and oral contraceptive treatments through [facility] because of her menometrorrhagia. 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 Her husband Kelly Mendez is a truck Geophysicist/field seismologist. The patient's daughter Kelly Mendez is studying pre-veterinary medicine in college. Kelly Mendez has 3 children, all boys, a keen is a cook in [place] and lives independently. The 2 younger boys, Kelly Mendez and Kelly Mendez, aged 32 and 47 as of April 2018, are at home with the patient    ADVANCED DIRECTIVES: Not in place   HEALTH MAINTENANCE: Social History  Substance Use Topics  . Smoking status: Current Every Day Smoker    Packs/day: 0.15    Years: 25.00    Types: Cigarettes  . Smokeless tobacco: Never Used  . Alcohol use 0.6 oz/week    1 Glasses of wine per week     Colonoscopy: n/a  PAP:  Bone density:   Allergies  Allergen Reactions  . Penicillins Other (See Comments)    As a child Has patient had a PCN reaction causing immediate rash, facial/tongue/throat swelling, SOB or lightheadedness with hypotension: unknown Has patient had a PCN reaction causing severe rash involving mucus membranes or skin necrosis: unknown Has patient had a PCN reaction that required hospitalization unknown Has patient had a PCN reaction occurring within the last 10 years: no If all of the above answers are "NO", then may proceed with Cephalosporin use.     Current Outpatient Prescriptions  Medication Sig Dispense Refill  . albuterol (PROVENTIL HFA;VENTOLIN HFA) 108 (90 Base) MCG/ACT inhaler Inhale 1-2 puffs into the lungs every 6 (six) hours as  needed for wheezing or shortness of breath.    Marland Kitchen atorvastatin (LIPITOR) 20 MG tablet Take 1 tablet (20 mg total) by mouth daily. 30 tablet 1  . dexamethasone (DECADRON) 4 MG tablet Take 2 tablets by mouth once a day on the day after chemotherapy and then take 2 tablets two times a day for 2 days. Take with food. 30 tablet 1  . fluconazole (DIFLUCAN) 200 MG tablet Take 1 tablet (200 mg total) by mouth daily. 5 tablet 5  . Glucose Blood (BLOOD GLUCOSE TEST STRIPS) STRP Test twice a day. Pt uses one touch verio flex meter 100 each 5  . ibuprofen (ADVIL,MOTRIN) 200 MG tablet Take 400 mg by mouth daily as needed for headache.    . lidocaine-prilocaine (EMLA) cream Apply to affected area once 30 g 3  . LORazepam (ATIVAN) 0.5 MG tablet Take 1 tablet (0.5 mg total) by mouth at bedtime as needed (Nausea or vomiting). 30 tablet 0  . magic mouthwash SOLN Take 5 mLs by mouth 4 (four) times daily. 240 mL 3  . medroxyPROGESTERone (  DEPO-PROVERA) 150 MG/ML injection Inject 150 mg into the muscle every 3 (three) months. Last dose January 2018    . Melatonin 10 MG TABS Take 10 mg by mouth at bedtime.    . metFORMIN (GLUCOPHAGE) 1000 MG tablet Take 1 tablet (1,000 mg total) by mouth 2 (two) times daily with a meal. 60 tablet 2  . ONETOUCH DELICA LANCETS FINE MISC Test twice a day 100 each 5  . oxyCODONE (OXY IR/ROXICODONE) 5 MG immediate release tablet Take 1-2 tablets (5-10 mg total) by mouth every 6 (six) hours as needed for severe pain. 25 tablet 0  . prochlorperazine (COMPAZINE) 10 MG tablet Take 1 tablet (10 mg total) by mouth every 6 (six) hours as needed (Nausea or vomiting). 30 tablet 1   No current facility-administered medications for this visit.     OBJECTIVE: Young African-American woman who appears stated age Vitals:   11/19/16 0839  BP: 120/82  Pulse: 84  Temp: 98.6 F (37 C)     Body mass index is 43.53 kg/m.    ECOG FS:1 - Symptomatic but completely ambulatory  Sclerae unicteric, EOMs  intact Oropharynx clear and moist No cervical or supraclavicular adenopathy Lungs no rales or rhonchi Heart regular rate and rhythm Abd soft, nontender, positive bowel sounds MSK no focal spinal tenderness, no upper extremity lymphedema Neuro: nonfocal, well oriented, appropriate affect Breasts: Deferred    LAB RESULTS:  CMP     Component Value Date/Time   NA 134 (L) 11/12/2016 0957   K 4.6 11/12/2016 0957   CL 105 10/29/2016 0710   CO2 20 (L) 11/12/2016 0957   GLUCOSE 334 (H) 11/12/2016 0957   BUN 19.6 11/12/2016 0957   CREATININE 1.0 11/12/2016 0957   CALCIUM 9.6 11/12/2016 0957   PROT 7.0 11/12/2016 0957   ALBUMIN 4.0 11/12/2016 0957   AST 8 11/12/2016 0957   ALT 21 11/12/2016 0957   ALKPHOS 110 11/12/2016 0957   BILITOT 0.50 11/12/2016 0957   GFRNONAA >60 10/29/2016 0710   GFRAA >60 10/29/2016 0710    No results found for: TOTALPROTELP, ALBUMINELP, A1GS, A2GS, BETS, BETA2SER, GAMS, MSPIKE, SPEI  No results found for: Nils Pyle, [facility]  Lab Results  Component Value Date   WBC 6.9 11/12/2016   NEUTROABS 6.4 11/12/2016   HGB 13.8 11/12/2016   HCT 41.7 11/12/2016   MCV 75.8 (L) 11/12/2016   PLT 235 11/12/2016      Chemistry      Component Value Date/Time   NA 134 (L) 11/12/2016 0957   K 4.6 11/12/2016 0957   CL 105 10/29/2016 0710   CO2 20 (L) 11/12/2016 0957   BUN 19.6 11/12/2016 0957   CREATININE 1.0 11/12/2016 0957      Component Value Date/Time   CALCIUM 9.6 11/12/2016 0957   ALKPHOS 110 11/12/2016 0957   AST 8 11/12/2016 0957   ALT 21 11/12/2016 0957   BILITOT 0.50 11/12/2016 0957       No results found for: LABCA2  No components found for: [id number]  No results for input(s): INR in the last 168 hours.  Urinalysis    Component Value Date/Time   BILIRUBINUR n 09/29/2016 1045   PROTEINUR trace 09/29/2016 1045   UROBILINOGEN negative 09/29/2016 1045   NITRITE n 09/29/2016 1045   LEUKOCYTESUR Negative 09/29/2016  1045     STUDIES: Mr Breast Bilateral W Wo Contrast  Result Date: 10/29/2016 CLINICAL DATA:  41 year old female with new diagnosis of right breast grade 3 invasive ductal carcinoma.  The patient is due to start chemotherapy in 1 week. The patient's mother was diagnosed with breast cancer diagnosed at age 39. LABS:  Creatinine was obtained on site at [facility] at [address] Results: Creatinine 0.7 mg/dL and GFR of 112. EXAM: BILATERAL BREAST MRI WITH AND WITHOUT CONTRAST TECHNIQUE: Multiplanar, multisequence MR images of both breasts were obtained prior to and following the intravenous administration of 19 ml of MultiHance. THREE-DIMENSIONAL MR IMAGE RENDERING ON INDEPENDENT WORKSTATION: Three-dimensional MR images were rendered by post-processing of the original MR data on an independent workstation. The three-dimensional MR images were interpreted, and findings are reported in the following complete MRI report for this study. Three dimensional images were evaluated at the independent DynaCad workstation COMPARISON:  No prior MRI available for comparison. FINDINGS: Breast composition: b. Scattered fibroglandular tissue. Background parenchymal enhancement: Minimal. Right breast: In the upper inner quadrant of the right breast, posterior depth there is an enhancing mass with an internal focus of susceptibility consistent with the biopsy-proven site of invasive disease which measures 1.2 x 1.0 x 1.0 cm. No other sites of disease are identified in the right breast. Left breast: No mass or abnormal enhancement. Lymph nodes: Linear enhancement extending from 1 of the right axillary lymph nodes to the skin surface likely corresponds with biopsy changes from the recent benign biopsy. No abnormal appearing lymph nodes. Ancillary findings:  None. IMPRESSION: 1. Biopsy proven invasive breast cancer in the upper-inner quadrant of the right breast measures 1.2 cm. No evidence of disease elsewhere in the  right breast. 2.  No MRI evidence of left breast malignancy. RECOMMENDATION: Continue treatment plan. BI-RADS CATEGORY  6: Known biopsy-proven malignancy. Electronically Signed   By: Ammie Ferrier M.D.   On: 10/29/2016 16:58   Dg Chest Port 1 View  Result Date: 10/29/2016 CLINICAL DATA:  Status post port placement EXAM: PORTABLE CHEST 1 VIEW COMPARISON:  05/06/2013 FINDINGS: Cardiac shadow is at the upper limits of normal in size. A new right chest wall port is noted with the catheter tip in the proximal superior vena cava just below the level of the azygos vein. Lungs are clear. No pneumothorax is noted. No bony abnormality is seen. IMPRESSION: Right chest wall port with catheter tip as described. Electronically Signed   By: Inez Catalina M.D.   On: 10/29/2016 10:08   Dg Fluoro Guide Cv Line-no Report  Result Date: 10/29/2016 Fluoroscopy was utilized by the requesting physician.  No radiographic interpretation.    ELIGIBLE FOR AVAILABLE RESEARCH PROTOCOL: not a candidate for PREVENT  ASSESSMENT: 41 y.o.  [place] woman status post right breast upper inner quadrant biopsy 10/16/2016 for a clinical T1b pN0, stage IB invasive ductal carcinoma, grade 3, triple negative, with an MIB-1 of 80%.  (1)  genetics testing scheduled for 12/01/2016  (2) neoadjuvant chemotherapy to consist of doxorubicin and cyclophosphamide in dose dense fashion 4 starting 11/05/2016 followed by weekly Abraxane 12  (3) breast conserving surgery to follow  (4) adjuvant radiation to follow chemotherapy     PLAN: Norrine generally tolerated her first cycle well in that her nadir counts were good and she had little nausea. Her blood sugars were uncontrolled despite her and our best efforts and we simply are not going to be able to give her any steroids for nausea control. This means that we are not to be able to give her Taxol and I have changed her orders to Abraxane.  Since she cannot take dexamethasone she will  lean  on the Compazine for the next few days. After 2 days she can also add ondansetron. She is going to let us know if this does not work for her.  It is a hardship for her to have to come back day 3 for her Neulasta. I will see if we can get her on Pro starting today.  As far as her mouth sores are concerned I asked her to take Diflucan for days 1 through 3 of each cycle of doxorubicin and cyclophosphamide. I'm also adding Valtrex 1 g daily which she will take for the next 7 days.  I have encouraged her to be as active as she possibly can. She understands the mental fogginess is complex and may persist. It will eventually clear, but in some patients this can take months.  She will see me again in a week. She knows to call for any problems that may develop before her next visit here.  Chauncey Cruel, MD   11/19/2016 8:41 AM Medical Oncology and Hematology [facility] [address] [place], [place] [address] Tel. [phone]    Fax. [phone]

## 2016-11-20 ENCOUNTER — Encounter: Payer: Self-pay | Admitting: Family Medicine

## 2016-11-21 ENCOUNTER — Ambulatory Visit (HOSPITAL_BASED_OUTPATIENT_CLINIC_OR_DEPARTMENT_OTHER): Payer: Managed Care, Other (non HMO)

## 2016-11-21 VITALS — BP 150/88 | HR 88 | Temp 97.6°F | Resp 20

## 2016-11-21 DIAGNOSIS — C50211 Malignant neoplasm of upper-inner quadrant of right female breast: Secondary | ICD-10-CM

## 2016-11-21 DIAGNOSIS — Z5189 Encounter for other specified aftercare: Secondary | ICD-10-CM

## 2016-11-21 DIAGNOSIS — Z171 Estrogen receptor negative status [ER-]: Secondary | ICD-10-CM

## 2016-11-21 MED ORDER — PEGFILGRASTIM INJECTION 6 MG/0.6ML ~~LOC~~
6.0000 mg | PREFILLED_SYRINGE | Freq: Once | SUBCUTANEOUS | Status: AC
  Administered 2016-11-21: 6 mg via SUBCUTANEOUS
  Filled 2016-11-21: qty 0.6

## 2016-11-21 NOTE — Patient Instructions (Signed)
Pegfilgrastim injection What is this medicine? PEGFILGRASTIM (PEG fil gra stim) is a long-acting granulocyte colony-stimulating factor that stimulates the growth of neutrophils, a type of white blood cell important in the body's fight against infection. It is used to reduce the incidence of fever and infection in patients with certain types of cancer who are receiving chemotherapy that affects the bone marrow, and to increase survival after being exposed to high doses of radiation. This medicine may be used for other purposes; ask your health care provider or pharmacist if you have questions. COMMON BRAND NAME(S): Neulasta What should I tell my health care provider before I take this medicine? They need to know if you have any of these conditions: -kidney disease -latex allergy -ongoing radiation therapy -sickle cell disease -skin reactions to acrylic adhesives (On-Body Injector only) -an unusual or allergic reaction to pegfilgrastim, filgrastim, other medicines, foods, dyes, or preservatives -pregnant or trying to get pregnant -breast-feeding How should I use this medicine? This medicine is for injection under the skin. If you get this medicine at home, you will be taught how to prepare and give the pre-filled syringe or how to use the On-body Injector. Refer to the patient Instructions for Use for detailed instructions. Use exactly as directed. Tell your healthcare provider immediately if you suspect that the On-body Injector may not have performed as intended or if you suspect the use of the On-body Injector resulted in a missed or partial dose. It is important that you put your used needles and syringes in a special sharps container. Do not put them in a trash can. If you do not have a sharps container, call your pharmacist or healthcare provider to get one. Talk to your pediatrician regarding the use of this medicine in children. While this drug may be prescribed for selected conditions,  precautions do apply. Overdosage: If you think you have taken too much of this medicine contact a poison control center or emergency room at once. NOTE: This medicine is only for you. Do not share this medicine with others. What if I miss a dose? It is important not to miss your dose. Call your doctor or health care professional if you miss your dose. If you miss a dose due to an On-body Injector failure or leakage, a new dose should be administered as soon as possible using a single prefilled syringe for manual use. What may interact with this medicine? Interactions have not been studied. Give your health care provider a list of all the medicines, herbs, non-prescription drugs, or dietary supplements you use. Also tell them if you smoke, drink alcohol, or use illegal drugs. Some items may interact with your medicine. This list may not describe all possible interactions. Give your health care provider a list of all the medicines, herbs, non-prescription drugs, or dietary supplements you use. Also tell them if you smoke, drink alcohol, or use illegal drugs. Some items may interact with your medicine. What should I watch for while using this medicine? You may need blood work done while you are taking this medicine. If you are going to need a MRI, CT scan, or other procedure, tell your doctor that you are using this medicine (On-Body Injector only). What side effects may I notice from receiving this medicine? Side effects that you should report to your doctor or health care professional as soon as possible: -allergic reactions like skin rash, itching or hives, swelling of the face, lips, or tongue -dizziness -fever -pain, redness, or irritation at site   where injected -pinpoint red spots on the skin -red or dark-brown urine -shortness of breath or breathing problems -stomach or side pain, or pain at the shoulder -swelling -tiredness -trouble passing urine or change in the amount of urine Side  effects that usually do not require medical attention (report to your doctor or health care professional if they continue or are bothersome): -bone pain -muscle pain This list may not describe all possible side effects. Call your doctor for medical advice about side effects. You may report side effects to FDA at 1-800-FDA-1088. Where should I keep my medicine? Keep out of the reach of children. Store pre-filled syringes in a refrigerator between 2 and 8 degrees C (36 and 46 degrees F). Do not freeze. Keep in carton to protect from light. Throw away this medicine if it is left out of the refrigerator for more than 48 hours. Throw away any unused medicine after the expiration date. NOTE: This sheet is a summary. It may not cover all possible information. If you have questions about this medicine, talk to your doctor, pharmacist, or health care provider.  2018 [employer] (2016-06-26 12:58:03)  

## 2016-11-23 ENCOUNTER — Encounter (HOSPITAL_COMMUNITY): Payer: Self-pay | Admitting: *Deleted

## 2016-11-23 ENCOUNTER — Emergency Department (HOSPITAL_BASED_OUTPATIENT_CLINIC_OR_DEPARTMENT_OTHER)
Admit: 2016-11-23 | Discharge: 2016-11-23 | Disposition: A | Discharge status: Home / Self Care | Payer: Managed Care, Other (non HMO) | Attending: Emergency Medicine | Admitting: Emergency Medicine

## 2016-11-23 ENCOUNTER — Emergency Department (HOSPITAL_COMMUNITY)
Admission: EM | Admit: 2016-11-23 | Discharge: 2016-11-23 | Disposition: A | Discharge status: Home / Self Care | Payer: Managed Care, Other (non HMO) | Attending: Emergency Medicine | Admitting: Emergency Medicine

## 2016-11-23 DIAGNOSIS — Z853 Personal history of malignant neoplasm of breast: Secondary | ICD-10-CM | POA: Diagnosis not present

## 2016-11-23 DIAGNOSIS — Z7984 Long term (current) use of oral hypoglycemic drugs: Secondary | ICD-10-CM | POA: Insufficient documentation

## 2016-11-23 DIAGNOSIS — F17210 Nicotine dependence, cigarettes, uncomplicated: Secondary | ICD-10-CM | POA: Diagnosis not present

## 2016-11-23 DIAGNOSIS — J45909 Unspecified asthma, uncomplicated: Secondary | ICD-10-CM | POA: Diagnosis not present

## 2016-11-23 DIAGNOSIS — E119 Type 2 diabetes mellitus without complications: Secondary | ICD-10-CM | POA: Diagnosis not present

## 2016-11-23 DIAGNOSIS — I82621 Acute embolism and thrombosis of deep veins of right upper extremity: Secondary | ICD-10-CM

## 2016-11-23 DIAGNOSIS — M7989 Other specified soft tissue disorders: Secondary | ICD-10-CM | POA: Diagnosis present

## 2016-11-23 LAB — CBC
HCT: 38.5 % (ref 36.0–46.0)
Hemoglobin: 12.5 g/dL (ref 12.0–15.0)
MCH: 25.2 pg — ABNORMAL LOW (ref 26.0–34.0)
MCHC: 32.5 g/dL (ref 30.0–36.0)
MCV: 77.5 fL — ABNORMAL LOW (ref 78.0–100.0)
Platelets: 153 10*3/uL (ref 150–400)
RBC: 4.97 MIL/uL (ref 3.87–5.11)
RDW: 15.9 % — ABNORMAL HIGH (ref 11.5–15.5)
WBC: 29.8 10*3/uL — ABNORMAL HIGH (ref 4.0–10.5)

## 2016-11-23 LAB — BASIC METABOLIC PANEL
Anion gap: 9 (ref 5–15)
BUN: 9 mg/dL (ref 6–20)
CO2: 23 mmol/L (ref 22–32)
Calcium: 9.2 mg/dL (ref 8.9–10.3)
Chloride: 105 mmol/L (ref 101–111)
Creatinine, Ser: 0.76 mg/dL (ref 0.44–1.00)
GFR calc Af Amer: >60 mL/min (ref >60)
GFR calc non Af Amer: >60 mL/min (ref >60)
Glucose, Bld: 202 mg/dL — ABNORMAL HIGH (ref 65–99)
Potassium: 4.1 mmol/L (ref 3.5–5.1)
Sodium: 137 mmol/L (ref 135–145)

## 2016-11-23 MED ORDER — ONDANSETRON HCL 4 MG/2ML IJ SOLN
4.0000 mg | Freq: Once | INTRAMUSCULAR | Status: AC
  Administered 2016-11-23: 4 mg via INTRAVENOUS
  Filled 2016-11-23: qty 2

## 2016-11-23 MED ORDER — MORPHINE SULFATE (PF) 4 MG/ML IV SOLN
4.0000 mg | Freq: Once | INTRAVENOUS | Status: AC
  Administered 2016-11-23: 4 mg via INTRAVENOUS
  Filled 2016-11-23: qty 1

## 2016-11-23 MED ORDER — ENOXAPARIN SODIUM 150 MG/ML ~~LOC~~ SOLN
150.0000 mg | Freq: Once | SUBCUTANEOUS | Status: AC
  Administered 2016-11-23: 150 mg via SUBCUTANEOUS
  Filled 2016-11-23: qty 1

## 2016-11-23 MED ORDER — ENOXAPARIN SODIUM 30 MG/0.3ML ~~LOC~~ SOLN: 1.0000 mg/kg | Freq: Two times a day (BID) | SUBCUTANEOUS | 0 refills | Status: DC

## 2016-11-23 NOTE — ED Provider Notes (Signed)
[facility] DEPT Provider Note   CSN: [id number] Arrival date & time: 11/23/16  1042     History   Chief Complaint Chief Complaint  Patient presents with  . Arm Pain    HPI Kelly Mendez is a 41 y.o. female.  HPI  Pt with hx of recently diagnosed breast cancer presenting with pain and swelling of right upper extremity. Pt states she first noted pain approx 4 days ago in right upper arm, since then her arm has been swelling diffusely and now is noticing swelling in right forearm and hand as well.  No chest pain or shortness of breath.  No fever/chills.  No IVs or lines in the area of her pain.  No redness, no injuries or trauma to the area.  There are no other associated systemic symptoms, there are no other alleviating or modifying factors.   Past Medical History:  Diagnosis Date  . Abnormal Pap smear of cervix   . Anxiety   . Asthma   . Cancer ([place])   . Concussion    around age 34  . Depression   . Diabetes type 2, uncontrolled ([place])    new diagnosis in 08/2016  . HPV in female   . Hyperlipemia   . PTSD (post-traumatic stress disorder)     Patient Active Problem List   Diagnosis Date Noted  . Diabetes type 2, uncontrolled ([place])   . Malignant neoplasm of upper-inner quadrant of right breast in female, estrogen receptor negative ([place]) 10/21/2016  . Low HDL (under 40) 09/16/2016  . ASCVD (arteriosclerotic cardiovascular disease) 09/16/2016  . Light cigarette smoker 09/15/2016  . New onset type 2 diabetes mellitus ([place]) 09/15/2016  . Morbid obesity ([place]) 09/15/2016  . Asthma 09/15/2016  . Trichomoniasis 09/15/2016    Past Surgical History:  Procedure Laterality Date  . COLPOSCOPY    . left ovary removed  1978  . PORTACATH PLACEMENT Right 10/29/2016   Procedure: INSERTION PORT-A-CATH WITH [place];  Surgeon: [name], MD;  Location: [facility];  Service: General;  Laterality: Right;    OB History    No data available       Home Medications    Prior to  Admission medications   Medication Sig Start Date End Date Taking? Authorizing Provider  atorvastatin (LIPITOR) 20 MG tablet Take 1 tablet (20 mg total) by mouth daily. 09/16/16  Yes Henson, Vickie L, NP-C  fluconazole (DIFLUCAN) 200 MG tablet Take 1 tablet (200 mg total) by mouth daily. 11/19/16  Yes Magrinat, Virgie Dad, MD  Glucose Blood (BLOOD GLUCOSE TEST STRIPS) STRP Test twice a day. Pt uses one touch verio flex meter 09/15/16  Yes Henson, Vickie L, NP-C  ibuprofen (ADVIL,MOTRIN) 200 MG tablet Take 400 mg by mouth daily as needed for headache.   Yes [provider]  lidocaine-prilocaine (EMLA) cream Apply to affected area once 10/31/16  Yes Magrinat, Virgie Dad, MD  magic mouthwash SOLN Take 5 mLs by mouth 4 (four) times daily. 11/19/16  Yes Magrinat, Virgie Dad, MD  Melatonin 10 MG TABS Take 10 mg by mouth at bedtime.   Yes [provider]  metFORMIN (GLUCOPHAGE) 1000 MG tablet Take 1 tablet (1,000 mg total) by mouth 2 (two) times daily with a meal. 09/29/16  Yes Henson, Vickie L, NP-C  ondansetron (ZOFRAN) 8 MG tablet Take 1 tablet (8 mg total) by mouth every 8 (eight) hours as needed for nausea or vomiting. 11/19/16  Yes Magrinat, Virgie Dad, MD  [place]  Test twice a day 09/15/16  Yes Henson, Vickie L, NP-C  prochlorperazine (COMPAZINE) 10 MG tablet Take 1 tablet (10 mg total) by mouth every 6 (six) hours as needed (Nausea or vomiting). 11/19/16  Yes Magrinat, Virgie Dad, MD  valACYclovir (VALTREX) 1000 MG tablet Take 1 tablet (1,000 mg total) by mouth daily. 11/19/16  Yes Magrinat, Virgie Dad, MD  enoxaparin (LOVENOX) 30 MG/0.3ML injection Inject 1.5 mLs (150 mg total) into the skin every 12 (twelve) hours. 11/23/16   Alfonzo Beers, MD  LORazepam (ATIVAN) 0.5 MG tablet Take 1 tablet (0.5 mg total) by mouth at bedtime as needed (Nausea or vomiting). Patient not taking: Reported on 11/23/2016 10/31/16   Magrinat, Virgie Dad, MD  oxyCODONE (OXY IR/ROXICODONE) 5 MG immediate release  tablet Take 1-2 tablets (5-10 mg total) by mouth every 6 (six) hours as needed for severe pain. Patient not taking: Reported on 11/23/2016 10/29/16   [name], MD    Family History Family History  Problem Relation Age of Onset  . Breast cancer Mother   . Hepatitis C Father     Social History Social History  Substance Use Topics  . Smoking status: Current Every Day Smoker    Packs/day: 0.15    Years: 25.00    Types: Cigarettes  . Smokeless tobacco: Never Used  . Alcohol use 0.6 oz/week    1 Glasses of wine per week     Allergies   Penicillins   Review of Systems Review of Systems  ROS reviewed and all otherwise negative except for mentioned in HPI   Physical Exam Updated Vital Signs BP (!) 149/94 (BP Location: Left Arm)   Pulse 85   Temp 98 F (36.7 C) (Oral)   Resp 16   Ht 6' 1.5" (1.867 m)   Wt (!) 153.8 kg   LMP 10/27/2016   SpO2 100%   BMI 44.12 kg/m  Vitals reviewed Physical Exam Physical Examination: General appearance - alert, well appearing, and in no distress Mental status - alert, oriented to person, place, and time Eyes - no conjunctival injection, no scleral icterua Chest - clear to auscultation, no wheezes, rales or rhonchi, symmetric air entry Heart - normal rate, regular rhythm, normal S1, S2, no murmurs, rubs, clicks or gallops Abdomen - soft, nontender, nondistended, no masses or organomegaly Neurological - alert, oriented, normal speech Musculoskeletal - right upper extremity is larger than left upper exremity, ttp over area just proximal to right antecubital fossa and in right axilla, mild swelling of right hand, no discoloration Extremities - peripheral pulses normal, no pedal edema, no clubbing or cyanosis Skin - normal coloration and turgor, no rashes  ED Treatments / Results  Labs (all labs ordered are listed, but only abnormal results are displayed) Labs Reviewed  CBC - Abnormal; Notable for the following:       Result Value     WBC 29.8 (*)    MCV 77.5 (*)    MCH 25.2 (*)    RDW 15.9 (*)    All other components within normal limits  BASIC METABOLIC PANEL - Abnormal; Notable for the following:    Glucose, Bld 202 (*)    All other components within normal limits    EKG  EKG Interpretation None       Radiology No results found.  Procedures Procedures (including critical care time)  Medications Ordered in ED Medications  morphine 4 MG/ML injection 4 mg (4 mg Intravenous Given 11/23/16 1204)  ondansetron (ZOFRAN) injection 4 mg (  4 mg Intravenous Given 11/23/16 1204)  enoxaparin (LOVENOX) injection 150 mg (150 mg Subcutaneous Given 11/23/16 1420)     Initial Impression / Assessment and Plan / ED Course  I have reviewed the triage vital signs and the nursing notes.  Pertinent labs & imaging results that were available during my care of the patient were reviewed by me and considered in my medical decision making (see chart for details).   pt has received first dose of lovenox- case management consulted to help with getting the prescription.  Pt states she has some percocet at home to help with pain.  She also has a followup appointment scheduled with oncology in 3 days and can be bridged to coumadin.  Discharged with strict return precautions.  Pt agreeable with plan.  Pt with right upper extremity swelling and pain- has DVT on ultrasound - see notes above  Final Clinical Impressions(s) / ED Diagnoses   Final diagnoses:  Acute deep vein thrombosis (DVT) of right upper extremity, unspecified vein ([place])    New Prescriptions Discharge Medication List as of 11/23/2016  2:38 PM    START taking these medications   Details  enoxaparin (LOVENOX) 30 MG/0.3ML injection Inject 1.5 mLs (150 mg total) into the skin every 12 (twelve) hours., Starting Sun 11/23/2016, Print         Alfonzo Beers, MD 11/25/16 [phone]

## 2016-11-23 NOTE — ED Notes (Signed)
Bed: WA04 Expected date:  Expected time:  Means of arrival:  Comments: 

## 2016-11-23 NOTE — ED Triage Notes (Signed)
Pt states she has pain under rt upper arm in two different locations, noted swelling in upper arm. Last chemo May 9th

## 2016-11-23 NOTE — Discharge Instructions (Signed)
Return to the ED with any concerns including chest pain, difficulty breathing, increased swelling or pain of arm, fainting, cough, decreased level of alertness/lethargy, or any other alarming symptoms

## 2016-11-23 NOTE — Progress Notes (Signed)
*  PRELIMINARY RESULTS* Vascular Ultrasound Right upper extremity venous duplex has been completed.  Preliminary findings: The right upper extremity is positive for acute deep vein thrombosis in the right IJV, subclavian, axillary and brachial veins.  The right upper extremity is positive for superficial vein thrombosis in the right basilic vein and proximal cephalic veins.  Left IJV and subclavian appear patent.  Preliminary results given to Dr. Canary Brim at 12:50.  Everrett Coombe 11/23/2016, 12:53 PM

## 2016-11-23 NOTE — ED Notes (Signed)
PT DISCHARGED. INSTRUCTIONS AND PRESCRIPTION GIVEN. AAOX4. PT IN NO APPARENT DISTRESS WITH MILD PAIN. THE OPPORTUNITY TO ASK QUESTIONS WAS PROVIDED. 

## 2016-11-24 DIAGNOSIS — I82409 Acute embolism and thrombosis of unspecified deep veins of unspecified lower extremity: Secondary | ICD-10-CM

## 2016-11-24 HISTORY — DX: Acute embolism and thrombosis of unspecified deep veins of unspecified lower extremity: I82.409

## 2016-11-26 ENCOUNTER — Ambulatory Visit (HOSPITAL_BASED_OUTPATIENT_CLINIC_OR_DEPARTMENT_OTHER): Payer: Managed Care, Other (non HMO) | Admitting: Oncology

## 2016-11-26 ENCOUNTER — Other Ambulatory Visit (HOSPITAL_BASED_OUTPATIENT_CLINIC_OR_DEPARTMENT_OTHER): Payer: Managed Care, Other (non HMO)

## 2016-11-26 ENCOUNTER — Other Ambulatory Visit: Payer: Self-pay | Admitting: *Deleted

## 2016-11-26 VITALS — BP 141/102 | HR 113 | Temp 98.5°F | Resp 20 | Ht 73.5 in | Wt 339.4 lb

## 2016-11-26 DIAGNOSIS — I82621 Acute embolism and thrombosis of deep veins of right upper extremity: Secondary | ICD-10-CM

## 2016-11-26 DIAGNOSIS — Z720 Tobacco use: Secondary | ICD-10-CM | POA: Diagnosis not present

## 2016-11-26 DIAGNOSIS — C50211 Malignant neoplasm of upper-inner quadrant of right female breast: Secondary | ICD-10-CM

## 2016-11-26 DIAGNOSIS — Z171 Estrogen receptor negative status [ER-]: Secondary | ICD-10-CM

## 2016-11-26 DIAGNOSIS — R5383 Other fatigue: Secondary | ICD-10-CM

## 2016-11-26 DIAGNOSIS — I82A11 Acute embolism and thrombosis of right axillary vein: Secondary | ICD-10-CM | POA: Diagnosis not present

## 2016-11-26 DIAGNOSIS — I82C11 Acute embolism and thrombosis of right internal jugular vein: Secondary | ICD-10-CM | POA: Diagnosis not present

## 2016-11-26 DIAGNOSIS — I82B11 Acute embolism and thrombosis of right subclavian vein: Secondary | ICD-10-CM

## 2016-11-26 LAB — COMPREHENSIVE METABOLIC PANEL
ALT: 22 U/L (ref 0–55)
AST: 12 U/L (ref 5–34)
Albumin: 3.7 g/dL (ref 3.5–5.0)
Alkaline Phosphatase: 117 U/L (ref 40–150)
Anion Gap: 10 mEq/L (ref 3–11)
BUN: 7.2 mg/dL (ref 7.0–26.0)
CO2: 22 mEq/L (ref 22–29)
Calcium: 10 mg/dL (ref 8.4–10.4)
Chloride: 106 mEq/L (ref 98–109)
Creatinine: 0.8 mg/dL (ref 0.6–1.1)
EGFR: 87 mL/min/{1.73_m2} — ABNORMAL LOW (ref >90)
Glucose: 188 mg/dl — ABNORMAL HIGH (ref 70–140)
Potassium: 4.1 mEq/L (ref 3.5–5.1)
Sodium: 138 mEq/L (ref 136–145)
Total Bilirubin: 0.34 mg/dL (ref 0.20–1.20)
Total Protein: 6.8 g/dL (ref 6.4–8.3)

## 2016-11-26 LAB — CBC WITH DIFFERENTIAL/PLATELET
BASO%: 1.3 % (ref 0.0–2.0)
Basophils Absolute: 0 10*3/uL (ref 0.0–0.1)
EOS%: 0.3 % (ref 0.0–7.0)
Eosinophils Absolute: 0 10*3/uL (ref 0.0–0.5)
HCT: 39.2 % (ref 34.8–46.6)
HGB: 12.8 g/dL (ref 11.6–15.9)
LYMPH%: 37.6 % (ref 14.0–49.7)
MCH: 25.2 pg (ref 25.1–34.0)
MCHC: 32.5 g/dL (ref 31.5–36.0)
MCV: 77.4 fL — ABNORMAL LOW (ref 79.5–101.0)
MONO#: 0.2 10*3/uL (ref 0.1–0.9)
MONO%: 7.2 % (ref 0.0–14.0)
NEUT#: 1.5 10*3/uL (ref 1.5–6.5)
NEUT%: 53.6 % (ref 38.4–76.8)
Platelets: 210 10*3/uL (ref 145–400)
RBC: 5.07 10*6/uL (ref 3.70–5.45)
RDW: 15.1 % — ABNORMAL HIGH (ref 11.2–14.5)
WBC: 2.9 10*3/uL — ABNORMAL LOW (ref 3.9–10.3)
lymph#: 1.1 10*3/uL (ref 0.9–3.3)

## 2016-11-26 MED ORDER — RIVAROXABAN (XARELTO) VTE STARTER PACK (15 & 20 MG): ORAL_TABLET | ORAL | 0 refills | Status: DC

## 2016-11-26 NOTE — Telephone Encounter (Signed)
Prescription for Rivaroxaban faxed per entry printed vs transcribing via EPIC to pharmacy.  Signature obtained and prescription faxed to pharmacy listed in EPIC.

## 2016-11-26 NOTE — Progress Notes (Signed)
[name]  Telephone:([phone]) [phone] Fax:(336) [phone]     ID: Kelly Mendez DOB: September 20, 1975  MR#: [id number]  JYN#:[id number]  Patient Care Team: [name], NP-C as PCP - General (Family Medicine) [name], MD as Consulting Physician (General Surgery) Acasia Skilton, Virgie Dad, MD as Consulting Physician (Oncology) Eppie Gibson, MD as Attending Physician (Radiation Oncology) Chauncey Cruel, MD OTHER MD:  CHIEF COMPLAINT: Triple negative breast cancer  CURRENT TREATMENT: Neoadjuvant chemotherapy   BREAST CANCER HISTORY: From the original intake note:  Kelly Mendez had her first ever mammogram 10/07/2016 at the [facility]. This showed a possible mass in the right breast. On 10/13/2016 she underwent bilateral diagnostic mammography with tomography and right breast ultrasonography. This found the breast density to be category B. In the upper inner quadrant of the right breast there was a circumscribed mass which was barely palpable. Ultrasonography confirmed a 0.9 cm right breast mass at the 1:00 radiant 18 cm from the nipple ultrasound of the axilla showed 1 indeterminate right axillary lymph node with borderline thickening of the anterior cortex.  On 10/16/2016 the patient underwent biopsy of the right breast mass and the suspicious right axillary lymph node. The right breast mass proved to be an invasive ductal carcinoma, grade 3, estrogen and progesterone receptor negative, with an MIB-1 of 80%, and no HER-2 amplification, the signals ratio being 1.30 and the number per cell 1.75. The lymph node was negative and concordant.  Her subsequent history is as detailed below  INTERVAL HISTORY: Ma[place]e returns today for follow-up of her triple negative breast cancer. Today is day 8 cycle 2 of 4 planned cycles of cyclophosphamide and doxorubicin given every 14 days, to be followed by Abraxane weekly 12.  On 11/23/2016 she presented to the emergency room with significant  right upper extremity pain. They obtained a right upper extremity venous duplex and it shows acute deep vein thrombosis in the right internal jugular vein, the subclavian, axillary, and the brachial veins. There is also superficial right upper extremity clot in the basilic and proximal cephalic veins. The left side is clear. She was started on Lovenox twice a day, on which she continues.  She has noted minimal swelling of the right upper extremity, mostly in the hand, but she tells me it looks better to her already.  REVIEW OF SYSTEMS: She still having pain in the right upper extremity although it is significantly decreased. As far as the chemotherapy is concerned she did not take dexamethasone but used the Compazine and Zofran as instructed and has essentially no nausea. She is having a bit more fatigued. She is keeping herself well hydrated. She had one episode of diarrhea which he treated easily with Imodium. She is sleeping well with melatonin and is not using the lorazepam at all. Her blood sugars were much less elevated this time, about to her baseline, namely around 200 max. A detailed review of systems today was otherwise stable  PAST MEDICAL HISTORY: Past Medical History:  Diagnosis Date  . Abnormal Pap smear of cervix   . Anxiety   . Asthma   . Cancer ([place])   . Concussion    around age 41  . Depression   . Diabetes type 2, uncontrolled ([place])    new diagnosis in 08/2016  . HPV in female   . Hyperlipemia   . PTSD (post-traumatic stress disorder)     PAST SURGICAL HISTORY: Past Surgical History:  Procedure Laterality Date  . COLPOSCOPY    . left  ovary removed  1978  . PORTACATH PLACEMENT Right 10/29/2016   Procedure: INSERTION PORT-A-CATH WITH [place];  Surgeon: [name], MD;  Location: [facility];  Service: General;  Laterality: Right;    FAMILY HISTORY Family History  Problem Relation Age of Onset  . Breast cancer Mother   . Hepatitis C Father   The patient's mother was  diagnosed with breast cancer at age 50, but tells me the lump in her breast had been present for at least 10 years prior to that. She is now 15 and doing well. The patient's father died at the age of 72 from sepsis following liver transplantation. The patient has 2 brothers, no sisters. The patient tells me that she has at least 5 and sent cousins with breast cancer and there are other relatives with uterine cancer.  GYNECOLOGIC HISTORY:  Patient's last menstrual period was 10/27/2016. Menarche age 41, first live birth age 35, the patient is GX P1. She has had multiple progesterone and oral contraceptive treatments through [facility] because of her menometrorrhagia. 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 Her husband Kelly Mendez is a truck Geophysicist/field seismologist. The patient's daughter Kelly Mendez is studying pre-veterinary medicine in college. Kelly Mendez has 3 children, all boys, a keen is a cook in [place] and lives independently. The 2 younger boys, Kelly Mendez and Kelly Mendez, aged 13 and 94 as of April 2018, are at home with the patient    ADVANCED DIRECTIVES: Not in place   HEALTH MAINTENANCE: Social History  Substance Use Topics  . Smoking status: Current Every Day Smoker    Packs/day: 0.15    Years: 25.00    Types: Cigarettes  . Smokeless tobacco: Never Used  . Alcohol use 0.6 oz/week    1 Glasses of wine per week     Colonoscopy: n/a  PAP:  Bone density:   Allergies  Allergen Reactions  . Penicillins Other (See Comments)    As a child Has patient had a PCN reaction causing immediate rash, facial/tongue/throat swelling, SOB or lightheadedness with hypotension: unknown Has patient had a PCN reaction causing severe rash involving mucus membranes or skin necrosis: unknown Has patient had a PCN reaction that required hospitalization unknown Has patient had a PCN reaction occurring within the last 10 years: no If all of the  above answers are "NO", then may proceed with Cephalosporin use.     Current Outpatient Prescriptions  Medication Sig Dispense Refill  . atorvastatin (LIPITOR) 20 MG tablet Take 1 tablet (20 mg total) by mouth daily. 30 tablet 1  . enoxaparin (LOVENOX) 30 MG/0.3ML injection Inject 1.5 mLs (150 mg total) into the skin every 12 (twelve) hours. 25 mL 0  . fluconazole (DIFLUCAN) 200 MG tablet Take 1 tablet (200 mg total) by mouth daily. 5 tablet 5  . Glucose Blood (BLOOD GLUCOSE TEST STRIPS) STRP Test twice a day. Pt uses one touch verio flex meter 100 each 5  . ibuprofen (ADVIL,MOTRIN) 200 MG tablet Take 400 mg by mouth daily as needed for headache.    . lidocaine-prilocaine (EMLA) cream Apply to affected area once 30 g 3  . LORazepam (ATIVAN) 0.5 MG tablet Take 1 tablet (0.5 mg total) by mouth at bedtime as needed (Nausea or vomiting). (Patient not taking: Reported on 11/23/2016) 30 tablet 0  . magic mouthwash SOLN Take 5 mLs by mouth 4 (four) times daily. 240 mL 3  . Melatonin 10 MG TABS Take 10  mg by mouth at bedtime.    . metFORMIN (GLUCOPHAGE) 1000 MG tablet Take 1 tablet (1,000 mg total) by mouth 2 (two) times daily with a meal. 60 tablet 2  . ondansetron (ZOFRAN) 8 MG tablet Take 1 tablet (8 mg total) by mouth every 8 (eight) hours as needed for nausea or vomiting. 20 tablet 0  . ONETOUCH DELICA LANCETS FINE MISC Test twice a day 100 each 5  . oxyCODONE (OXY IR/ROXICODONE) 5 MG immediate release tablet Take 1-2 tablets (5-10 mg total) by mouth every 6 (six) hours as needed for severe pain. (Patient not taking: Reported on 11/23/2016) 25 tablet 0  . prochlorperazine (COMPAZINE) 10 MG tablet Take 1 tablet (10 mg total) by mouth every 6 (six) hours as needed (Nausea or vomiting). 30 tablet 1  . valACYclovir (VALTREX) 1000 MG tablet Take 1 tablet (1,000 mg total) by mouth daily. 30 tablet 0   No current facility-administered medications for this visit.     OBJECTIVE: Young African-American  woman   Vitals:   11/26/16 1354  BP: (!) 141/102  Pulse: (!) 113  Resp: 20  Temp: 98.5 F (36.9 C)     Body mass index is 44.17 kg/m.    ECOG FS:1 - Symptomatic but completely ambulatory  Sclerae unicteric, pupils round and equal Oropharynx clear and moist No cervical or supraclavicular adenopathy Lungs no rales or rhonchi Heart regular rate and rhythm Abd soft, nontender, positive bowel sounds MSK no focal spinal tenderness, no appreciable right upper extremity lymphedema, no erythema Neuro: nonfocal, well oriented, appropriate affect Breasts: Deferred     LAB RESULTS:  CMP     Component Value Date/Time   NA 137 11/23/2016 1152   NA 137 11/19/2016 0906   K 4.1 11/23/2016 1152   K 4.1 11/19/2016 0906   CL 105 11/23/2016 1152   CO2 23 11/23/2016 1152   CO2 23 11/19/2016 0906   GLUCOSE 202 (H) 11/23/2016 1152   GLUCOSE 187 (H) 11/19/2016 0906   BUN 9 11/23/2016 1152   BUN 9.7 11/19/2016 0906   CREATININE 0.76 11/23/2016 1152   CREATININE 0.8 11/19/2016 0906   CALCIUM 9.2 11/23/2016 1152   CALCIUM 9.1 11/19/2016 0906   PROT 6.5 11/19/2016 0906   ALBUMIN 3.7 11/19/2016 0906   AST 15 11/19/2016 0906   ALT 32 11/19/2016 0906   ALKPHOS 117 11/19/2016 0906   BILITOT 0.33 11/19/2016 0906   GFRNONAA >60 11/23/2016 1152   GFRAA >60 11/23/2016 1152    No results found for: TOTALPROTELP, ALBUMINELP, A1GS, A2GS, BETS, BETA2SER, GAMS, MSPIKE, SPEI  No results found for: Nils Pyle, [facility]  Lab Results  Component Value Date   WBC 2.9 (L) 11/26/2016   NEUTROABS 1.5 11/26/2016   HGB 12.8 11/26/2016   HCT 39.2 11/26/2016   MCV 77.4 (L) 11/26/2016   PLT 210 11/26/2016      Chemistry      Component Value Date/Time   NA 137 11/23/2016 1152   NA 137 11/19/2016 0906   K 4.1 11/23/2016 1152   K 4.1 11/19/2016 0906   CL 105 11/23/2016 1152   CO2 23 11/23/2016 1152   CO2 23 11/19/2016 0906   BUN 9 11/23/2016 1152   BUN 9.7 11/19/2016 0906    CREATININE 0.76 11/23/2016 1152   CREATININE 0.8 11/19/2016 0906      Component Value Date/Time   CALCIUM 9.2 11/23/2016 1152   CALCIUM 9.1 11/19/2016 0906   ALKPHOS 117 11/19/2016 0906   AST 15  11/19/2016 0906   ALT 32 11/19/2016 0906   BILITOT 0.33 11/19/2016 0906       No results found for: LABCA2  No components found for: [id number]  No results for input(s): INR in the last 168 hours.  Urinalysis    Component Value Date/Time   BILIRUBINUR n 09/29/2016 1045   PROTEINUR trace 09/29/2016 1045   UROBILINOGEN negative 09/29/2016 1045   NITRITE n 09/29/2016 1045   LEUKOCYTESUR Negative 09/29/2016 1045     STUDIES: Mr Breast Bilateral W Wo Contrast  Result Date: 10/29/2016 CLINICAL DATA:  41 year old female with new diagnosis of right breast grade 3 invasive ductal carcinoma. The patient is due to start chemotherapy in 1 week. The patient's mother was diagnosed with breast cancer diagnosed at age 81. LABS:  Creatinine was obtained on site at [facility] at [address] Results: Creatinine 0.7 mg/dL and GFR of 112. EXAM: BILATERAL BREAST MRI WITH AND WITHOUT CONTRAST TECHNIQUE: Multiplanar, multisequence MR images of both breasts were obtained prior to and following the intravenous administration of 19 ml of MultiHance. THREE-DIMENSIONAL MR IMAGE RENDERING ON INDEPENDENT WORKSTATION: Three-dimensional MR images were rendered by post-processing of the original MR data on an independent workstation. The three-dimensional MR images were interpreted, and findings are reported in the following complete MRI report for this study. Three dimensional images were evaluated at the independent DynaCad workstation COMPARISON:  No prior MRI available for comparison. FINDINGS: Breast composition: b. Scattered fibroglandular tissue. Background parenchymal enhancement: Minimal. Right breast: In the upper inner quadrant of the right breast, posterior depth there is an enhancing mass with an  internal focus of susceptibility consistent with the biopsy-proven site of invasive disease which measures 1.2 x 1.0 x 1.0 cm. No other sites of disease are identified in the right breast. Left breast: No mass or abnormal enhancement. Lymph nodes: Linear enhancement extending from 1 of the right axillary lymph nodes to the skin surface likely corresponds with biopsy changes from the recent benign biopsy. No abnormal appearing lymph nodes. Ancillary findings:  None. IMPRESSION: 1. Biopsy proven invasive breast cancer in the upper-inner quadrant of the right breast measures 1.2 cm. No evidence of disease elsewhere in the right breast. 2.  No MRI evidence of left breast malignancy. RECOMMENDATION: Continue treatment plan. BI-RADS CATEGORY  6: Known biopsy-proven malignancy. Electronically Signed   By: Ammie Ferrier M.D.   On: 10/29/2016 16:58   Dg Chest Port 1 View  Result Date: 10/29/2016 CLINICAL DATA:  Status post port placement EXAM: PORTABLE CHEST 1 VIEW COMPARISON:  05/06/2013 FINDINGS: Cardiac shadow is at the upper limits of normal in size. A new right chest wall port is noted with the catheter tip in the proximal superior vena cava just below the level of the azygos vein. Lungs are clear. No pneumothorax is noted. No bony abnormality is seen. IMPRESSION: Right chest wall port with catheter tip as described. Electronically Signed   By: Inez Catalina M.D.   On: 10/29/2016 10:08   Dg Fluoro Guide Cv Line-no Report  Result Date: 10/29/2016 Fluoroscopy was utilized by the requesting physician.  No radiographic interpretation.    ELIGIBLE FOR AVAILABLE RESEARCH PROTOCOL: not a candidate for PREVENT  ASSESSMENT: 41 y.o.  [place] woman status post right breast upper inner quadrant biopsy 10/16/2016 for a clinical T1b pN0, stage IB invasive ductal carcinoma, grade 3, triple negative, with an MIB-1 of 80%.  (1)  genetics testing scheduled for 12/01/2016  (2) neoadjuvant chemotherapy to consist of  doxorubicin and cyclophosphamide in dose dense fashion 4 starting 11/05/2016 followed by weekly Abraxane 12  (3) breast conserving surgery to follow  (4) adjuvant radiation to follow chemotherapy     (5) extensive right upper extremity deep venous thrombosis documented 11/23/2016, treated initially with Lovenox  (a) transitioned to rivaroxaban as of 11/30/2016  PLAN: Charniece continues to do remarkably well as far as her chemotherapy is concerned. She is having a bit more fatigued but still no nausea, no mouth sores, no fever, and her counts are holding up remarkably well.  She has developed right upper extremity DVT. This is going to be related to her port being on that side. We generally treat as we are doing namely Lovenox, switch to rivaroxaban, and continue on total the port can be removed, which will be 5 months from now. She will then receive 1 additional month namely a total of 6 months of anticoagulation. We will repeat a right upper extremity Doppler at that point and assuming that and the d-dimer are favorable we will stop anticoagulation and then.  She has a good understanding of this plan. She knows to call us for any bleeding problems.  Otherwise she will return in 1 week for her third cycle of chemotherapy.Marland Kitchen  Chauncey Cruel, MD   11/26/2016 2:21 PM Medical Oncology and Hematology [facility] [address] [place], [place] [address] Tel. [phone]    Fax. [phone]

## 2016-12-01 ENCOUNTER — Encounter: Payer: Self-pay | Admitting: Genetic Counselor

## 2016-12-01 ENCOUNTER — Ambulatory Visit (HOSPITAL_BASED_OUTPATIENT_CLINIC_OR_DEPARTMENT_OTHER): Payer: Managed Care, Other (non HMO) | Admitting: Genetic Counselor

## 2016-12-01 ENCOUNTER — Other Ambulatory Visit: Payer: Managed Care, Other (non HMO)

## 2016-12-01 DIAGNOSIS — Z8041 Family history of malignant neoplasm of ovary: Secondary | ICD-10-CM | POA: Diagnosis not present

## 2016-12-01 DIAGNOSIS — Z8279 Family history of other congenital malformations, deformations and chromosomal abnormalities: Secondary | ICD-10-CM | POA: Diagnosis not present

## 2016-12-01 DIAGNOSIS — Z803 Family history of malignant neoplasm of breast: Secondary | ICD-10-CM | POA: Diagnosis not present

## 2016-12-01 DIAGNOSIS — Z315 Encounter for genetic counseling: Secondary | ICD-10-CM

## 2016-12-01 DIAGNOSIS — Z808 Family history of malignant neoplasm of other organs or systems: Secondary | ICD-10-CM

## 2016-12-01 DIAGNOSIS — Z171 Estrogen receptor negative status [ER-]: Secondary | ICD-10-CM | POA: Diagnosis not present

## 2016-12-01 DIAGNOSIS — Z801 Family history of malignant neoplasm of trachea, bronchus and lung: Secondary | ICD-10-CM | POA: Diagnosis not present

## 2016-12-01 DIAGNOSIS — C50211 Malignant neoplasm of upper-inner quadrant of right female breast: Secondary | ICD-10-CM | POA: Diagnosis not present

## 2016-12-01 NOTE — Progress Notes (Signed)
REFERRING PROVIDER: Chauncey Cruel, MD [place], [place] [address]  PRIMARY PROVIDER:  [name], NP-C  PRIMARY REASON FOR VISIT:  1. Malignant neoplasm of upper-inner quadrant of right breast in female, estrogen receptor negative ([place])   2. Family history of breast cancer   3. Family history of ovarian cancer   4. Family history of neurofibromatosis      HISTORY OF PRESENT ILLNESS:   Kelly Mendez, a 41 y.o. female, was seen for a [facility] cancer genetics consultation at the request of Dr. Jana Hakim due to a personal and family history of cancer.  Kelly Mendez presents to clinic today to discuss the possibility of a hereditary predisposition to cancer, genetic testing, and to further clarify her future cancer risks, as well as potential cancer risks for family members.   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 This was treated with chemotherapy.  She is scheduled to have surgery and is using genetic testing to help inform that surgery.      CANCER HISTORY:   No history exists.     HORMONAL RISK FACTORS:  Menarche was at age 26.  First live birth at age 19.  OCP use for approximately 5+ years.  Ovaries intact: yes.  Hysterectomy: no.  Menopausal status: premenopausal.  HRT use: 0 years. Colonoscopy: no; not examined. Mammogram within the last year: yes. Number of breast biopsies: 1. Up to date with pelvic exams:  yes. Any excessive radiation exposure in the past:  no  Past Medical History:  Diagnosis Date  . Abnormal Pap smear of cervix   . Anxiety   . Asthma   . Cancer ([place])   . Concussion    around age 43  . Depression   . Diabetes type 2, uncontrolled ([place])    new diagnosis in 08/2016  . Family history of breast cancer   . Family history of neurofibromatosis   . Family history of ovarian cancer   . HPV in female   . Hyperlipemia   . PTSD (post-traumatic stress disorder)      Past Surgical History:  Procedure Laterality Date  . COLPOSCOPY    . left ovary removed  1978  . PORTACATH PLACEMENT Right 10/29/2016   Procedure: INSERTION PORT-A-CATH WITH [place];  Surgeon: [name], MD;  Location: [facility];  Service: General;  Laterality: Right;    Social History   Social History  . Marital status: Married    Spouse name: N/A  . Number of children: N/A  . Years of education: N/A   Social History Main Topics  . Smoking status: Current Every Day Smoker    Packs/day: 0.15    Years: 25.00    Types: Cigarettes  . Smokeless tobacco: Never Used  . Alcohol use 0.6 oz/week    1 Glasses of wine per week  . Drug use: No  . Sexual activity: Not Asked     Comment: husband vasectomy   Other Topics Concern  . None   Social History Narrative  . None     FAMILY HISTORY:  We obtained a detailed, 4-generation family history.  Significant diagnoses are listed below: Family History  Problem Relation Age of Onset  . Breast cancer Mother 46  . Hepatitis C Father   . Ovarian cancer Maternal Aunt        dx in her 57s  . Neurofibromatosis Maternal Uncle   . Brain  cancer Maternal Uncle 58  . Lung cancer Maternal Grandfather   . Kidney failure Paternal Grandmother   . Heart attack Paternal Grandfather   . Ovarian cancer Maternal Aunt        dx in her 72s  . Neurofibromatosis Maternal Aunt   . Ovarian cancer Maternal Aunt   . Neurofibromatosis Maternal Aunt   . Cervical cancer Maternal Aunt   . Neurofibromatosis Maternal Aunt   . Cancer Maternal Aunt        cancer on the bottom of her foot  . Breast cancer Other        MGF's sisters  . Neurofibromatosis Other        MGM's paternal aunt    The patient has one daughter who is cancer free.  She has a maternal half brother and paternal half brother who are both cancer free.  Her father died of Hepatitis C.  He had one brother who is cancer free.  There is no other cancer history on this side of the family.     Her mother had breast cancer at 81.  She was treated in [place].  She has one brother and 7 sisters.  Three sisters were diagnosed with ovarian cancer, two sisters and her brother have been diagnosed with neurofibromatosis.  Her brother died of a brain tumor.  The NF comes from the patient's maternal grandmother's side of the family as her grandmother had a paternal aunt with NF.  The aptient's grandfather died of lung cancer, but he had two sisters with breast cacner.    Kelly Mendez is unaware of previous family history of genetic testing for hereditary cancer risks. Patient's maternal ancestors are of [place], [place], [place] and [place] and [place] American descent, and paternal ancestors are of [place] and [place] American descent. There is no reported Ashkenazi Jewish ancestry. There is no known consanguinity.  GENETIC COUNSELING ASSESSMENT: Kelly Mendez is a 41 y.o. female with a personal and family history of breast cancer and a family history of Neurofibromatosis which is somewhat suggestive of a hereditary cancer syndrome and predisposition to cancer. We, therefore, discussed and recommended the following at today's visit.   DISCUSSION: We discussed that about 5-10% of breast cancer is hereditary, with most cases due to BRCA mutations.  Other genes associated with hereditayr breast cancer syndromes include ATM, CHEK2 and PALB2.  The patient has a family history of Neurofibromatosis (NF).  NF is also thought to be associated with breast cancer.  The patient reports having cafe au lait spots, but we did not count them up.  We discussed that there is a chance that she could test positive for NF, for another hereditary breast cancer syndrome, or for two things, based on the bilineal risk coming from her mohter's side.  We reviewed the characteristics, features and inheritance patterns of hereditary cancer syndromes. We also discussed genetic testing, including the appropriate family members to test,  the process of testing, insurance coverage and turn-around-time for results. We discussed the implications of a negative, positive and/or variant of uncertain significant result. We recommended Kelly Mendez pursue genetic testing for the Multi-gene gene panel. The Multi-Gene Panel offered by [facility] includes sequencing and/or deletion duplication testing of the following 80 genes: ALK, APC, ATM, AXIN2,BAP1,  BARD1, BLM, BMPR1A, BRCA1, BRCA2, BRIP1, CASR, CDC73, CDH1, CDK4, CDKN1B, CDKN1C, CDKN2A (p14ARF), CDKN2A (p16INK4a), CEBPA, CHEK2, CTNNA1, DICER1, DIS3L2, EGFR (c.2369C>T, p.Thr790Met variant only), EPCAM (Deletion/duplication testing only), FH, FLCN, GATA2, GPC3, GREM1 (Promoter region deletion/duplication testing  only), HOXB13 (c.251G>A, p.Gly84Glu), HRAS, KIT, MAX, MEN1, MET, MITF (c.952G>A, p.Glu318Lys variant only), MLH1, MSH2, MSH3, MSH6, MUTYH, NBN, NF1, NF2, NTHL1, PALB2, PDGFRA, PHOX2B, PMS2, POLD1, POLE, POT1, PRKAR1A, PTCH1, PTEN, RAD50, RAD51C, RAD51D, RB1, RECQL4, RET, RUNX1, SDHAF2, SDHA (sequence changes only), SDHB, SDHC, SDHD, SMAD4, SMARCA4, SMARCB1, SMARCE1, STK11, SUFU, TERT, TERT, TMEM127, TP53, TSC1, TSC2, VHL, WRN and WT1.    Based on Kelly Mendez's personal and family history of cancer, she meets medical criteria for genetic testing. Despite that she meets criteria, she may still have an out of pocket cost. We discussed that if her out of pocket cost for testing is over $100, the laboratory will call and confirm whether she wants to proceed with testing.  If the out of pocket cost of testing is less than $100 she will be billed by the genetic testing laboratory.   PLAN: After considering the risks, benefits, and limitations, Kelly Mendez  provided informed consent to pursue genetic testing and the blood sample was sent to [facility] for analysis of the Multi-gene panel. Results should be available within approximately 2-3 weeks' time, at which point they will be disclosed by  telephone to Kelly Mendez, as will any additional recommendations warranted by these results. Kelly Mendez will receive a summary of her genetic counseling visit and a copy of her results once available. This information will also be available in Epic. We encouraged Kelly Mendez to remain in contact with cancer genetics annually so that we can continuously update the family history and inform her of any changes in cancer genetics and testing that may be of benefit for her family. Kelly Mendez questions were answered to her satisfaction today. Our contact information was provided should additional questions or concerns arise.  Lastly, we encouraged Kelly Mendez to remain in contact with cancer genetics annually so that we can continuously update the family history and inform her of any changes in cancer genetics and testing that may be of benefit for this family.   Ms.  Mendez questions were answered to her satisfaction today. Our contact information was provided should additional questions or concerns arise. Thank you for the referral and allowing [place] to share in the care of your patient.   Jabbar Palmero P. Florene [place], [place], [facility] Certified Genetic Counselor [email or web address] phone: [phone]  The patient was seen for a total of 45 minutes in face-to-face genetic counseling.  This patient was discussed with Drs. Magrinat, Lindi Adie and/or Burr Medico who agrees with the above.    _______________________________________________________________________ For Office Staff:  Number of people involved in session: 3 Was an Intern/ student involved with case: no

## 2016-12-03 ENCOUNTER — Other Ambulatory Visit (HOSPITAL_BASED_OUTPATIENT_CLINIC_OR_DEPARTMENT_OTHER): Payer: Managed Care, Other (non HMO)

## 2016-12-03 ENCOUNTER — Telehealth: Payer: Self-pay | Admitting: Oncology

## 2016-12-03 ENCOUNTER — Ambulatory Visit (HOSPITAL_BASED_OUTPATIENT_CLINIC_OR_DEPARTMENT_OTHER): Payer: Managed Care, Other (non HMO)

## 2016-12-03 ENCOUNTER — Ambulatory Visit (HOSPITAL_BASED_OUTPATIENT_CLINIC_OR_DEPARTMENT_OTHER): Payer: Managed Care, Other (non HMO) | Admitting: Oncology

## 2016-12-03 ENCOUNTER — Other Ambulatory Visit: Payer: Managed Care, Other (non HMO)

## 2016-12-03 DIAGNOSIS — I82621 Acute embolism and thrombosis of deep veins of right upper extremity: Secondary | ICD-10-CM | POA: Diagnosis not present

## 2016-12-03 DIAGNOSIS — C50211 Malignant neoplasm of upper-inner quadrant of right female breast: Secondary | ICD-10-CM

## 2016-12-03 DIAGNOSIS — Z171 Estrogen receptor negative status [ER-]: Secondary | ICD-10-CM

## 2016-12-03 DIAGNOSIS — Z5111 Encounter for antineoplastic chemotherapy: Secondary | ICD-10-CM | POA: Diagnosis not present

## 2016-12-03 DIAGNOSIS — Z452 Encounter for adjustment and management of vascular access device: Secondary | ICD-10-CM | POA: Diagnosis not present

## 2016-12-03 LAB — COMPREHENSIVE METABOLIC PANEL
ALT: 25 U/L (ref 0–55)
AST: 16 U/L (ref 5–34)
Albumin: 3.5 g/dL (ref 3.5–5.0)
Alkaline Phosphatase: 116 U/L (ref 40–150)
Anion Gap: 9 mEq/L (ref 3–11)
BUN: 8.7 mg/dL (ref 7.0–26.0)
CO2: 24 mEq/L (ref 22–29)
Calcium: 9.3 mg/dL (ref 8.4–10.4)
Chloride: 106 mEq/L (ref 98–109)
Creatinine: 0.8 mg/dL (ref 0.6–1.1)
EGFR: >90 mL/min/{1.73_m2} (ref >90)
Glucose: 208 mg/dl — ABNORMAL HIGH (ref 70–140)
Potassium: 4.2 mEq/L (ref 3.5–5.1)
Sodium: 139 mEq/L (ref 136–145)
Total Bilirubin: 0.29 mg/dL (ref 0.20–1.20)
Total Protein: 6 g/dL — ABNORMAL LOW (ref 6.4–8.3)

## 2016-12-03 LAB — CBC WITH DIFFERENTIAL/PLATELET
BASO%: 0.4 % (ref 0.0–2.0)
Basophils Absolute: 0 10*3/uL (ref 0.0–0.1)
EOS%: 0.2 % (ref 0.0–7.0)
Eosinophils Absolute: 0 10*3/uL (ref 0.0–0.5)
HCT: 33.3 % — ABNORMAL LOW (ref 34.8–46.6)
HGB: 11.1 g/dL — ABNORMAL LOW (ref 11.6–15.9)
LYMPH%: 12.1 % — ABNORMAL LOW (ref 14.0–49.7)
MCH: 25.3 pg (ref 25.1–34.0)
MCHC: 33.1 g/dL (ref 31.5–36.0)
MCV: 76.3 fL — ABNORMAL LOW (ref 79.5–101.0)
MONO#: 0.9 10*3/uL (ref 0.1–0.9)
MONO%: 7.9 % (ref 0.0–14.0)
NEUT#: 8.6 10*3/uL — ABNORMAL HIGH (ref 1.5–6.5)
NEUT%: 79.4 % — ABNORMAL HIGH (ref 38.4–76.8)
Platelets: 190 10*3/uL (ref 145–400)
RBC: 4.37 10*6/uL (ref 3.70–5.45)
RDW: 15.2 % — ABNORMAL HIGH (ref 11.2–14.5)
WBC: 10.8 10*3/uL — ABNORMAL HIGH (ref 3.9–10.3)
lymph#: 1.3 10*3/uL (ref 0.9–3.3)

## 2016-12-03 MED ORDER — ONDANSETRON HCL 8 MG PO TABS: 8.0000 mg | ORAL_TABLET | Freq: Three times a day (TID) | ORAL | 0 refills | Status: DC | PRN

## 2016-12-03 MED ORDER — PALONOSETRON HCL INJECTION 0.25 MG/5ML
0.2500 mg | Freq: Once | INTRAVENOUS | Status: AC
Start: 2016-12-03 — End: 2016-12-03
  Administered 2016-12-03: 0.25 mg via INTRAVENOUS

## 2016-12-03 MED ORDER — ALTEPLASE 2 MG IJ SOLR
2.0000 mg | Freq: Once | INTRAMUSCULAR | Status: AC | PRN
  Administered 2016-12-03: 2 mg
  Filled 2016-12-03: qty 2

## 2016-12-03 MED ORDER — SODIUM CHLORIDE 0.9 % IV SOLN
Freq: Once | INTRAVENOUS | Status: AC
  Administered 2016-12-03: 12:00:00 via INTRAVENOUS

## 2016-12-03 MED ORDER — DOXORUBICIN HCL CHEMO IV INJECTION 2 MG/ML
60.0000 mg/m2 | Freq: Once | INTRAVENOUS | Status: AC
  Administered 2016-12-03: 174 mg via INTRAVENOUS
  Filled 2016-12-03: qty 87

## 2016-12-03 MED ORDER — PALONOSETRON HCL INJECTION 0.25 MG/5ML
INTRAVENOUS | Status: AC
  Filled 2016-12-03: qty 5

## 2016-12-03 MED ORDER — SODIUM CHLORIDE 0.9% FLUSH
10.0000 mL | INTRAVENOUS | Status: DC | PRN
  Administered 2016-12-03: 10 mL
  Filled 2016-12-03: qty 10

## 2016-12-03 MED ORDER — HEPARIN SOD (PORK) LOCK FLUSH 100 UNIT/ML IV SOLN
500.0000 [IU] | Freq: Once | INTRAVENOUS | Status: AC | PRN
  Administered 2016-12-03: 500 [IU]
  Filled 2016-12-03: qty 5

## 2016-12-03 MED ORDER — SODIUM CHLORIDE 0.9 % IV SOLN
Freq: Once | INTRAVENOUS | Status: AC
  Administered 2016-12-03: 12:00:00 via INTRAVENOUS
  Filled 2016-12-03: qty 5

## 2016-12-03 MED ORDER — SODIUM CHLORIDE 0.9 % IV SOLN
600.0000 mg/m2 | Freq: Once | INTRAVENOUS | Status: AC
  Administered 2016-12-03: 1740 mg via INTRAVENOUS
  Filled 2016-12-03: qty 87

## 2016-12-03 MED ORDER — PROCHLORPERAZINE MALEATE 10 MG PO TABS
10.0000 mg | ORAL_TABLET | Freq: Four times a day (QID) | ORAL | 1 refills | Status: DC | PRN
Start: 2016-12-03 — End: 2017-03-11

## 2016-12-03 NOTE — Progress Notes (Signed)
11:30: Cathflo removed. Port-a-cath has good blood return.

## 2016-12-03 NOTE — Progress Notes (Signed)
Blood return noted before, every 3 cc and after Adriamycin push.  1306: During Cytoxan infusion pt reports nasal itching/burning, infusion rate decreased.  1310: pt states nasal itching/burning has not worsened but has not gotten any better. Cytoxan paused.  1315: Nasal burning/itching better, Cytoxan restarted. 1355: nasal burning/itching resolved.  1400: pt stable at discharge.

## 2016-12-03 NOTE — Progress Notes (Signed)
[facility]  [phone]) [phone] [phone]     ID: Kelly Mendez DOB: 16-Aug-1975  MR#: [id number]  YKZ#:[id number]  Patient Care Team: [name], NP-C as PCP - General (Family Medicine) [name], MD as Consulting Physician (General Surgery) Magrinat, Virgie Dad, MD as Consulting Physician (Oncology) Eppie Gibson, MD as Attending Physician (Radiation Oncology) Chauncey Cruel, MD OTHER MD:  CHIEF COMPLAINT: Triple negative breast cancer  CURRENT TREATMENT: Neoadjuvant chemotherapy   BREAST CANCER HISTORY: From the original intake note:  Kelly Mendez had her first ever mammogram 10/07/2016 at the [facility]. This showed a possible mass in the right breast. On 10/13/2016 she underwent bilateral diagnostic mammography with tomography and right breast ultrasonography. This found the breast density to be category B. In the upper inner quadrant of the right breast there was a circumscribed mass which was barely palpable. Ultrasonography confirmed a 0.9 cm right breast mass at the 1:00 radiant 18 cm from the nipple ultrasound of the axilla showed 1 indeterminate right axillary lymph node with borderline thickening of the anterior cortex.  On 10/16/2016 the patient underwent biopsy of the right breast mass and the suspicious right axillary lymph node. The right breast mass proved to be an invasive ductal carcinoma, grade 3, estrogen and progesterone receptor negative, with an MIB-1 of 80%, and no HER-2 amplification, the signals ratio being 1.30 and the number per cell 1.75. The lymph node was negative and concordant.  Her subsequent history is as detailed below  INTERVAL HISTORY: Kelly Mendez returns today for follow-up of her triple negative breast cancer. She will receive her third of 4 planned cycles of doxorubicin and cyclophosphamide today, these are given every 2 weeks. This will be followed by Abraxane weekly 12.  REVIEW OF SYSTEMS: She continues on  rivaroxaban for her right upper extremity DVT. The pain in the right arm has decreased considerably and now she has a little bit of achiness in the medial right elbow area and a little bit in the shoulder. There has not been any significant swelling or erythema. She tolerates chemotherapy well, with no problems with nausea, vomiting, mouth sores, or change in bowel or bladder habits. A detailed review of systems today was otherwise stable  PAST MEDICAL HISTORY: Past Medical History:  Diagnosis Date  . Abnormal Pap smear of cervix   . Anxiety   . Asthma   . Cancer ([place])   . Concussion    around age 57  . Depression   . Diabetes type 2, uncontrolled ([place])    new diagnosis in 08/2016  . Family history of breast cancer   . Family history of neurofibromatosis   . Family history of ovarian cancer   . HPV in female   . Hyperlipemia   . PTSD (post-traumatic stress disorder)     PAST SURGICAL HISTORY: Past Surgical History:  Procedure Laterality Date  . COLPOSCOPY    . left ovary removed  1978  . PORTACATH PLACEMENT Right 10/29/2016   Procedure: INSERTION PORT-A-CATH WITH [place];  Surgeon: [name], MD;  Location: [facility];  Service: General;  Laterality: Right;    FAMILY HISTORY Family History  Problem Relation Age of Onset  . Breast cancer Mother 73  . Hepatitis C Father   . Ovarian cancer Maternal Aunt        dx in her 32s  . Neurofibromatosis Maternal Uncle   . Brain cancer Maternal Uncle 58  . Lung cancer Maternal Grandfather   . Kidney failure Paternal Grandmother   .  Heart attack Paternal Grandfather   . Ovarian cancer Maternal Aunt        dx in her 45s  . Neurofibromatosis Maternal Aunt   . Ovarian cancer Maternal Aunt   . Neurofibromatosis Maternal Aunt   . Cervical cancer Maternal Aunt   . Neurofibromatosis Maternal Aunt   . Cancer Maternal Aunt        cancer on the bottom of her foot  . Breast cancer Other        MGF's sisters  . Neurofibromatosis Other         MGM's paternal aunt  The patient's mother was diagnosed with breast cancer at age 63, but tells me the lump in her breast had been present for at least 10 years prior to that. She is now 82 and doing well. The patient's father died at the age of 27 from sepsis following liver transplantation. The patient has 2 brothers, no sisters. The patient tells me that she has at least 5 and sent cousins with breast cancer and there are other relatives with uterine cancer.  GYNECOLOGIC HISTORY:  No LMP recorded. Menarche age 80, first live birth age 65, the patient is GX P1. She has had multiple progesterone and oral contraceptive treatments through [facility] because of her menometrorrhagia. 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 Her husband Kelly Mendez is a truck Geophysicist/field seismologist. The patient's daughter Kelly Mendez is studying pre-veterinary medicine in college. Kelly Mendez has 3 children, all boys, a keen is a cook in [place] and lives independently. The 2 younger boys, Kelly Mendez and Kelly Mendez, aged 44 and 33 as of April 2018, are at home with the patient    ADVANCED DIRECTIVES: Not in place   HEALTH MAINTENANCE: Social History  Substance Use Topics  . Smoking status: Current Every Day Smoker    Packs/day: 0.15    Years: 25.00    Types: Cigarettes  . Smokeless tobacco: Never Used  . Alcohol use 0.6 oz/week    1 Glasses of wine per week     Colonoscopy: n/a  PAP:  Bone density:   Allergies  Allergen Reactions  . Penicillins Other (See Comments)    As a child Has patient had a PCN reaction causing immediate rash, facial/tongue/throat swelling, SOB or lightheadedness with hypotension: unknown Has patient had a PCN reaction causing severe rash involving mucus membranes or skin necrosis: unknown Has patient had a PCN reaction that required hospitalization unknown Has patient had a PCN reaction occurring within the last 10 years:  no If all of the above answers are "NO", then may proceed with Cephalosporin use.     Current Outpatient Prescriptions  Medication Sig Dispense Refill  . atorvastatin (LIPITOR) 20 MG tablet Take 1 tablet (20 mg total) by mouth daily. 30 tablet 1  . enoxaparin (LOVENOX) 30 MG/0.3ML injection Inject 1.5 mLs (150 mg total) into the skin every 12 (twelve) hours. 25 mL 0  . fluconazole (DIFLUCAN) 200 MG tablet Take 1 tablet (200 mg total) by mouth daily. 5 tablet 5  . Glucose Blood (BLOOD GLUCOSE TEST STRIPS) STRP Test twice a day. Pt uses one touch verio flex meter 100 each 5  . ibuprofen (ADVIL,MOTRIN) 200 MG tablet Take 400 mg by mouth daily as needed for headache.    . lidocaine-prilocaine (EMLA) cream Apply to affected area once 30 g 3  . magic mouthwash SOLN Take 5 mLs by mouth 4 (four) times daily. [place]  mL 3  . Melatonin 10 MG TABS Take 10 mg by mouth at bedtime.    . metFORMIN (GLUCOPHAGE) 1000 MG tablet Take 1 tablet (1,000 mg total) by mouth 2 (two) times daily with a meal. 60 tablet 2  . ondansetron (ZOFRAN) 8 MG tablet Take 1 tablet (8 mg total) by mouth every 8 (eight) hours as needed for nausea or vomiting. 20 tablet 0  . ONETOUCH DELICA LANCETS FINE MISC Test twice a day 100 each 5  . prochlorperazine (COMPAZINE) 10 MG tablet Take 1 tablet (10 mg total) by mouth every 6 (six) hours as needed (Nausea or vomiting). 30 tablet 1  . Rivaroxaban 15 & 20 MG TBPK Take as directed on package: Start with one 70m tablet by mouth twice a day with food. On Day 22, switch to one 265mtablet once a day with food. 51 each 0  . valACYclovir (VALTREX) 1000 MG tablet Take 1 tablet (1,000 mg total) by mouth daily. 30 tablet 0   No current facility-administered medications for this visit.     OBJECTIVE: Young African-American woman In no acute distress  Vitals:   12/03/16 0950  BP: 133/70  Pulse: 87  Resp: 18  Temp: 98.1 F (36.7 C)     Body mass index is 45.49 kg/m.    ECOG FS:1 -  Symptomatic but completely ambulatory  Sclerae unicteric, EOMs intact Oropharynx clear and moist No cervical or supraclavicular adenopathy Lungs no rales or rhonchi Heart regular rate and rhythm Abd soft, nontender, positive bowel sounds MSK no focal spinal tenderness, no upper extremity lymphedema Neuro: nonfocal, well oriented, appropriate affect Breasts: Deferred   LAB RESULTS:  CMP     Component Value Date/Time   NA 138 11/26/2016 1339   K 4.1 11/26/2016 1339   CL 105 11/23/2016 1152   CO2 22 11/26/2016 1339   GLUCOSE 188 (H) 11/26/2016 1339   BUN 7.2 11/26/2016 1339   CREATININE 0.8 11/26/2016 1339   CALCIUM 10.0 11/26/2016 1339   PROT 6.8 11/26/2016 1339   ALBUMIN 3.7 11/26/2016 1339   AST 12 11/26/2016 1339   ALT 22 11/26/2016 1339   ALKPHOS 117 11/26/2016 1339   BILITOT 0.34 11/26/2016 1339   GFRNONAA >60 11/23/2016 1152   GFRAA >60 11/23/2016 1152    No results found for: TOTALPROTELP, ALBUMINELP, A1GS, A2GS, BETS, BETA2SER, GAMS, MSPIKE, SPEI  No results found for: KPAFRELGTCHN, LAMBDASER, KAPLAMBRATIO  Lab Results  Component Value Date   WBC 10.8 (H) 12/03/2016   NEUTROABS 8.6 (H) 12/03/2016   HGB 11.1 (L) 12/03/2016   HCT 33.3 (L) 12/03/2016   MCV 76.3 (L) 12/03/2016   PLT 190 12/03/2016      Chemistry      Component Value Date/Time   NA 138 11/26/2016 1339   K 4.1 11/26/2016 1339   CL 105 11/23/2016 1152   CO2 22 11/26/2016 1339   BUN 7.2 11/26/2016 1339   CREATININE 0.8 11/26/2016 1339      Component Value Date/Time   CALCIUM 10.0 11/26/2016 1339   ALKPHOS 117 11/26/2016 1339   AST 12 11/26/2016 1339   ALT 22 11/26/2016 1339   BILITOT 0.34 11/26/2016 1339       No results found for: LABCA2  No components found for: [id number] results for input(s): INR in the last 168 hours.  Urinalysis    Component Value Date/Time   BILIRUBINUR n 09/29/2016 1045   PROTEINUR trace 09/29/2016 1045   UROBILINOGEN negative 09/29/2016 1045  NITRITE n 09/29/2016 1045   LEUKOCYTESUR Negative 09/29/2016 1045     STUDIES: No results found.  ELIGIBLE FOR AVAILABLE RESEARCH PROTOCOL: not a candidate for PREVENT  ASSESSMENT: 41 y.o.  [place] woman status post right breast upper inner quadrant biopsy 10/16/2016 for a clinical T1b pN0, stage IB invasive ductal carcinoma, grade 3, triple negative, with an MIB-1 of 80%.  (1)  genetics testing scheduled for 12/01/2016  (2) neoadjuvant chemotherapy to consist of doxorubicin and cyclophosphamide in dose dense fashion 4 starting 11/05/2016 followed by weekly Abraxane 12  (3) breast conserving surgery to follow  (4) adjuvant radiation to follow chemotherapy     (5) extensive right upper extremity deep venous thrombosis documented 11/23/2016, treated initially with Lovenox  (a) transitioned to rivaroxaban as of 11/30/2016  (b) total 6 months anticoagulation planned (one month beyond port removal)  PLAN: Alexica Will proceed to her third of 4 planned cycles of a CT chemotherapy today. I'm making no changes in her doses or treatment interval. She is tolerating therapy remarkably well.  She is doing well with the rivaroxaban as well per she has had no bleeding problems. The pain in the right arm is gratifyingly decreased. The plan will be to continue the anticoagulation beyond one month after the port is removed  She has an appointment already for next week and then she will receive her final of the current treatments 2 weeks from now.  She knows to call for any problems that may develop before the next visit. Chauncey Cruel, MD   12/03/2016 10:00 AM Medical Oncology and Hematology [facility] [address] [place], [place] [address] Tel. [phone]    Fax. [phone]

## 2016-12-03 NOTE — Telephone Encounter (Signed)
No los at this time

## 2016-12-03 NOTE — Patient Instructions (Signed)
[facility] Discharge Instructions for Patients Receiving Chemotherapy  Today you received the following chemotherapy agents:Adriamycin and Cytoxan   To help prevent nausea and vomiting after your treatment, we encourage you to take your nausea medication as directed.    If you develop nausea and vomiting that is not controlled by your nausea medication, call the clinic.   BELOW ARE SYMPTOMS THAT SHOULD BE REPORTED IMMEDIATELY:  *FEVER GREATER THAN 100.5 F  *CHILLS WITH OR WITHOUT FEVER  NAUSEA AND VOMITING THAT IS NOT CONTROLLED WITH YOUR NAUSEA MEDICATION  *UNUSUAL SHORTNESS OF BREATH  *UNUSUAL BRUISING OR BLEEDING  TENDERNESS IN MOUTH AND THROAT WITH OR WITHOUT PRESENCE OF ULCERS  *URINARY PROBLEMS  *BOWEL PROBLEMS  UNUSUAL RASH Items with * indicate a potential emergency and should be followed up as soon as possible.  Feel free to call the clinic you have any questions or concerns. The clinic phone number is [phone].  Please show the CHEMO ALERT CARD at check-in to the Emergency Department and triage nurse.   

## 2016-12-05 ENCOUNTER — Ambulatory Visit (HOSPITAL_BASED_OUTPATIENT_CLINIC_OR_DEPARTMENT_OTHER): Payer: Managed Care, Other (non HMO)

## 2016-12-05 VITALS — BP 132/81 | HR 95 | Temp 98.2°F | Resp 18

## 2016-12-05 DIAGNOSIS — Z171 Estrogen receptor negative status [ER-]: Secondary | ICD-10-CM

## 2016-12-05 DIAGNOSIS — Z5189 Encounter for other specified aftercare: Secondary | ICD-10-CM

## 2016-12-05 DIAGNOSIS — C50211 Malignant neoplasm of upper-inner quadrant of right female breast: Secondary | ICD-10-CM

## 2016-12-05 MED ORDER — PEGFILGRASTIM INJECTION 6 MG/0.6ML ~~LOC~~
6.0000 mg | PREFILLED_SYRINGE | Freq: Once | SUBCUTANEOUS | Status: AC
  Administered 2016-12-05: 6 mg via SUBCUTANEOUS
  Filled 2016-12-05: qty 0.6

## 2016-12-05 NOTE — Patient Instructions (Signed)
Pegfilgrastim injection What is this medicine? PEGFILGRASTIM (PEG fil gra stim) is a long-acting granulocyte colony-stimulating factor that stimulates the growth of neutrophils, a type of white blood cell important in the body's fight against infection. It is used to reduce the incidence of fever and infection in patients with certain types of cancer who are receiving chemotherapy that affects the bone marrow, and to increase survival after being exposed to high doses of radiation. This medicine may be used for other purposes; ask your health care provider or pharmacist if you have questions. COMMON BRAND NAME(S): Neulasta What should I tell my health care provider before I take this medicine? They need to know if you have any of these conditions: -kidney disease -latex allergy -ongoing radiation therapy -sickle cell disease -skin reactions to acrylic adhesives (On-Body Injector only) -an unusual or allergic reaction to pegfilgrastim, filgrastim, other medicines, foods, dyes, or preservatives -pregnant or trying to get pregnant -breast-feeding How should I use this medicine? This medicine is for injection under the skin. If you get this medicine at home, you will be taught how to prepare and give the pre-filled syringe or how to use the On-body Injector. Refer to the patient Instructions for Use for detailed instructions. Use exactly as directed. Tell your healthcare provider immediately if you suspect that the On-body Injector may not have performed as intended or if you suspect the use of the On-body Injector resulted in a missed or partial dose. It is important that you put your used needles and syringes in a special sharps container. Do not put them in a trash can. If you do not have a sharps container, call your pharmacist or healthcare provider to get one. Talk to your pediatrician regarding the use of this medicine in children. While this drug may be prescribed for selected conditions,  precautions do apply. Overdosage: If you think you have taken too much of this medicine contact a poison control center or emergency room at once. NOTE: This medicine is only for you. Do not share this medicine with others. What if I miss a dose? It is important not to miss your dose. Call your doctor or health care professional if you miss your dose. If you miss a dose due to an On-body Injector failure or leakage, a new dose should be administered as soon as possible using a single prefilled syringe for manual use. What may interact with this medicine? Interactions have not been studied. Give your health care provider a list of all the medicines, herbs, non-prescription drugs, or dietary supplements you use. Also tell them if you smoke, drink alcohol, or use illegal drugs. Some items may interact with your medicine. This list may not describe all possible interactions. Give your health care provider a list of all the medicines, herbs, non-prescription drugs, or dietary supplements you use. Also tell them if you smoke, drink alcohol, or use illegal drugs. Some items may interact with your medicine. What should I watch for while using this medicine? You may need blood work done while you are taking this medicine. If you are going to need a MRI, CT scan, or other procedure, tell your doctor that you are using this medicine (On-Body Injector only). What side effects may I notice from receiving this medicine? Side effects that you should report to your doctor or health care professional as soon as possible: -allergic reactions like skin rash, itching or hives, swelling of the face, lips, or tongue -dizziness -fever -pain, redness, or irritation at site   where injected -pinpoint red spots on the skin -red or dark-brown urine -shortness of breath or breathing problems -stomach or side pain, or pain at the shoulder -swelling -tiredness -trouble passing urine or change in the amount of urine Side  effects that usually do not require medical attention (report to your doctor or health care professional if they continue or are bothersome): -bone pain -muscle pain This list may not describe all possible side effects. Call your doctor for medical advice about side effects. You may report side effects to FDA at 1-800-FDA-1088. Where should I keep my medicine? Keep out of the reach of children. Store pre-filled syringes in a refrigerator between 2 and 8 degrees C (36 and 46 degrees F). Do not freeze. Keep in carton to protect from light. Throw away this medicine if it is left out of the refrigerator for more than 48 hours. Throw away any unused medicine after the expiration date. NOTE: This sheet is a summary. It may not cover all possible information. If you have questions about this medicine, talk to your doctor, pharmacist, or health care provider.  2018 [employer] (2016-06-26 12:58:03)  

## 2016-12-10 ENCOUNTER — Ambulatory Visit (HOSPITAL_BASED_OUTPATIENT_CLINIC_OR_DEPARTMENT_OTHER): Payer: Managed Care, Other (non HMO) | Admitting: Oncology

## 2016-12-10 ENCOUNTER — Other Ambulatory Visit (HOSPITAL_BASED_OUTPATIENT_CLINIC_OR_DEPARTMENT_OTHER): Payer: Managed Care, Other (non HMO)

## 2016-12-10 VITALS — BP 132/79 | HR 88 | Temp 98.5°F | Resp 18 | Ht 73.0 in | Wt 343.7 lb

## 2016-12-10 DIAGNOSIS — I82621 Acute embolism and thrombosis of deep veins of right upper extremity: Secondary | ICD-10-CM

## 2016-12-10 DIAGNOSIS — C50211 Malignant neoplasm of upper-inner quadrant of right female breast: Secondary | ICD-10-CM

## 2016-12-10 DIAGNOSIS — R5383 Other fatigue: Secondary | ICD-10-CM

## 2016-12-10 DIAGNOSIS — Z171 Estrogen receptor negative status [ER-]: Secondary | ICD-10-CM | POA: Diagnosis not present

## 2016-12-10 DIAGNOSIS — Z720 Tobacco use: Secondary | ICD-10-CM | POA: Diagnosis not present

## 2016-12-10 LAB — COMPREHENSIVE METABOLIC PANEL
ALT: 14 U/L (ref 0–55)
AST: 11 U/L (ref 5–34)
Albumin: 3.4 g/dL — ABNORMAL LOW (ref 3.5–5.0)
Alkaline Phosphatase: 115 U/L (ref 40–150)
Anion Gap: 6 mEq/L (ref 3–11)
BUN: 8.2 mg/dL (ref 7.0–26.0)
CO2: 25 mEq/L (ref 22–29)
Calcium: 9.2 mg/dL (ref 8.4–10.4)
Chloride: 107 mEq/L (ref 98–109)
Creatinine: 0.8 mg/dL (ref 0.6–1.1)
EGFR: >90 mL/min/{1.73_m2} (ref >90)
Glucose: 150 mg/dl — ABNORMAL HIGH (ref 70–140)
Potassium: 3.9 mEq/L (ref 3.5–5.1)
Sodium: 137 mEq/L (ref 136–145)
Total Bilirubin: 0.25 mg/dL (ref 0.20–1.20)
Total Protein: 6 g/dL — ABNORMAL LOW (ref 6.4–8.3)

## 2016-12-10 LAB — CBC WITH DIFFERENTIAL/PLATELET
BASO%: 1.4 % (ref 0.0–2.0)
Basophils Absolute: 0.1 10*3/uL (ref 0.0–0.1)
EOS%: 0.3 % (ref 0.0–7.0)
Eosinophils Absolute: 0 10*3/uL (ref 0.0–0.5)
HCT: 29 % — ABNORMAL LOW (ref 34.8–46.6)
HGB: 9.4 g/dL — ABNORMAL LOW (ref 11.6–15.9)
LYMPH%: 22.4 % (ref 14.0–49.7)
MCH: 25 pg — ABNORMAL LOW (ref 25.1–34.0)
MCHC: 32.4 g/dL (ref 31.5–36.0)
MCV: 77.1 fL — ABNORMAL LOW (ref 79.5–101.0)
MONO#: 0.3 10*3/uL (ref 0.1–0.9)
MONO%: 9.3 % (ref 0.0–14.0)
NEUT#: 2.5 10*3/uL (ref 1.5–6.5)
NEUT%: 66.6 % (ref 38.4–76.8)
Platelets: 277 10*3/uL (ref 145–400)
RBC: 3.76 10*6/uL (ref 3.70–5.45)
RDW: 15.7 % — ABNORMAL HIGH (ref 11.2–14.5)
WBC: 3.7 10*3/uL — ABNORMAL LOW (ref 3.9–10.3)
lymph#: 0.8 10*3/uL — ABNORMAL LOW (ref 0.9–3.3)

## 2016-12-10 NOTE — Progress Notes (Signed)
[facility]  [phone] [phone]     ID: Kelly Mendez DOB: 08-06-1975  MR#: [id number]  CXK#:[id number]  Patient Care Team: [name], NP-C as PCP - General (Family Medicine) [name], MD as Consulting Physician (General Surgery) Magrinat, Virgie Dad, MD as Consulting Physician (Oncology) Eppie Gibson, MD as Attending Physician (Radiation Oncology) Chauncey Cruel, MD OTHER MD:  CHIEF COMPLAINT: Triple negative breast cancer  CURRENT TREATMENT: Neoadjuvant chemotherapy   BREAST CANCER HISTORY: From the original intake note:  Kelly Mendez had her first ever mammogram 10/07/2016 at the [facility]. This showed a possible mass in the right breast. On 10/13/2016 she underwent bilateral diagnostic mammography with tomography and right breast ultrasonography. This found the breast density to be category B. In the upper inner quadrant of the right breast there was a circumscribed mass which was barely palpable. Ultrasonography confirmed a 0.9 cm right breast mass at the 1:00 radiant 18 cm from the nipple ultrasound of the axilla showed 1 indeterminate right axillary lymph node with borderline thickening of the anterior cortex.  On 10/16/2016 the patient underwent biopsy of the right breast mass and the suspicious right axillary lymph node. The right breast mass proved to be an invasive ductal carcinoma, grade 3, estrogen and progesterone receptor negative, with an MIB-1 of 80%, and no HER-2 amplification, the signals ratio being 1.30 and the number per cell 1.75. The lymph node was negative and concordant.  Her subsequent history is as detailed below  INTERVAL HISTORY: Kelly Mendez returns today for follow-up of her triple negative breast cancer. Today is day 8 cycle 3 of 4 planned cycles of cyclophosphamide and doxorubicin given in dose dense fashion, to be followed by Abraxane weekly 12.  REVIEW OF SYSTEMS: Kelly Mendez is currently having her period,  which started 11/29/2016. She thinks its longer and heavier because of the rivaroxaban. She also bruises easily. She has had no epistaxis however. She was unusually tired for her after the third cycle and slept most of the weekend she says. She tried walking around [place] but after 10 minutes her legs felt like giving out and she had to sit down. She has had a little bit of dizziness but no falls. She is not eating and has a metallic taste in her mouth. She is doing better and drinking and she gets down at least 5 bottles of water plus T during the day. She has had no mouth sores, no nausea or vomiting, and the port is working well. A detailed review of systems today was otherwise stable  PAST MEDICAL HISTORY: Past Medical History:  Diagnosis Date  . Abnormal Pap smear of cervix   . Anxiety   . Asthma   . Cancer ([place])   . Concussion    around age 25  . Depression   . Diabetes type 2, uncontrolled ([place])    new diagnosis in 08/2016  . Family history of breast cancer   . Family history of neurofibromatosis   . Family history of ovarian cancer   . HPV in female   . Hyperlipemia   . PTSD (post-traumatic stress disorder)     PAST SURGICAL HISTORY: Past Surgical History:  Procedure Laterality Date  . COLPOSCOPY    . left ovary removed  1978  . PORTACATH PLACEMENT Right 10/29/2016   Procedure: INSERTION PORT-A-CATH WITH [place];  Surgeon: [name], MD;  Location: [facility];  Service: General;  Laterality: Right;    FAMILY HISTORY Family History  Problem Relation Age  of Onset  . Breast cancer Mother 74  . Hepatitis C Father   . Ovarian cancer Maternal Aunt        dx in her 62s  . Neurofibromatosis Maternal Uncle   . Brain cancer Maternal Uncle 58  . Lung cancer Maternal Grandfather   . Kidney failure Paternal Grandmother   . Heart attack Paternal Grandfather   . Ovarian cancer Maternal Aunt        dx in her 52s  . Neurofibromatosis Maternal Aunt   . Ovarian cancer Maternal Aunt   .  Neurofibromatosis Maternal Aunt   . Cervical cancer Maternal Aunt   . Neurofibromatosis Maternal Aunt   . Cancer Maternal Aunt        cancer on the bottom of her foot  . Breast cancer Other        MGF's sisters  . Neurofibromatosis Other        MGM's paternal aunt  The patient's mother was diagnosed with breast cancer at age 64, but tells me the lump in her breast had been present for at least 10 years prior to that. She is now 52 and doing well. The patient's father died at the age of 60 from sepsis following liver transplantation. The patient has 2 brothers, no sisters. The patient tells me that she has at least 5 and sent cousins with breast cancer and there are other relatives with uterine cancer.  GYNECOLOGIC HISTORY:  No LMP recorded. Menarche age 73, first live birth age 8, the patient is GX P1. She has had multiple progesterone and oral contraceptive treatments through [facility] because of her menometrorrhagia. 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 Her husband Kelly Mendez is a truck Geophysicist/field seismologist. The patient's daughter Kelly Mendez is studying pre-veterinary medicine in college. Kelly Mendez has 3 children, all boys, a keen is a cook in [place] and lives independently. The 2 younger boys, Kelly Mendez and Kelly Mendez, aged 33 and 70 as of April 2018, are at home with the patient    ADVANCED DIRECTIVES: Not in place   HEALTH MAINTENANCE: Social History  Substance Use Topics  . Smoking status: Current Every Day Smoker    Packs/day: 0.15    Years: 25.00    Types: Cigarettes  . Smokeless tobacco: Never Used  . Alcohol use 0.6 oz/week    1 Glasses of wine per week     Colonoscopy: n/a  PAP:  Bone density:   Allergies  Allergen Reactions  . Penicillins Other (See Comments)    As a child Has patient had a PCN reaction causing immediate rash, facial/tongue/throat swelling, SOB or lightheadedness with hypotension:  unknown Has patient had a PCN reaction causing severe rash involving mucus membranes or skin necrosis: unknown Has patient had a PCN reaction that required hospitalization unknown Has patient had a PCN reaction occurring within the last 10 years: no If all of the above answers are "NO", then may proceed with Cephalosporin use.     Current Outpatient Prescriptions  Medication Sig Dispense Refill  . atorvastatin (LIPITOR) 20 MG tablet Take 1 tablet (20 mg total) by mouth daily. 30 tablet 1  . fluconazole (DIFLUCAN) 200 MG tablet Take 1 tablet (200 mg total) by mouth daily. 5 tablet 5  . Glucose Blood (BLOOD GLUCOSE TEST STRIPS) STRP Test twice a day. Pt uses one touch verio flex meter 100 each 5  . ibuprofen (ADVIL,MOTRIN) 200 MG tablet Take 400 mg  by mouth daily as needed for headache.    . lidocaine-prilocaine (EMLA) cream Apply to affected area once 30 g 3  . magic mouthwash SOLN Take 5 mLs by mouth 4 (four) times daily. 240 mL 3  . Melatonin 10 MG TABS Take 10 mg by mouth at bedtime.    . metFORMIN (GLUCOPHAGE) 1000 MG tablet Take 1 tablet (1,000 mg total) by mouth 2 (two) times daily with a meal. 60 tablet 2  . ondansetron (ZOFRAN) 8 MG tablet Take 1 tablet (8 mg total) by mouth every 8 (eight) hours as needed for nausea or vomiting. 20 tablet 0  . ONETOUCH DELICA LANCETS FINE MISC Test twice a day 100 each 5  . prochlorperazine (COMPAZINE) 10 MG tablet Take 1 tablet (10 mg total) by mouth every 6 (six) hours as needed (Nausea or vomiting). 30 tablet 1  . Rivaroxaban 15 & 20 MG TBPK Take as directed on package: Start with one 89m tablet by mouth twice a day with food. On Day 22, switch to one 258mtablet once a day with food. 51 each 0  . valACYclovir (VALTREX) 1000 MG tablet Take 1 tablet (1,000 mg total) by mouth daily. 30 tablet 0   No current facility-administered medications for this visit.     OBJECTIVE: Young African-American womanWho appears well  Vitals:   12/10/16 1557    BP: 132/79  Pulse: 88  Resp: 18  Temp: 98.5 F (36.9 C)     Body mass index is 45.35 kg/m.    ECOG FS:1 - Symptomatic but completely ambulatory  Sclerae unicteric, pupils round and equal Oropharynx clear and moist No cervical or supraclavicular adenopathy Lungs no rales or rhonchi Heart regular rate and rhythm Abd soft, obese, nontender, positive bowel sounds MSK no focal spinal tenderness, no upper extremity lymphedema Neuro: nonfocal, well oriented, appropriate affect Breasts: Deferred   LAB RESULTS:  CMP     Component Value Date/Time   NA 137 12/10/2016 1524   K 3.9 12/10/2016 1524   CL 105 11/23/2016 1152   CO2 25 12/10/2016 1524   GLUCOSE 150 (H) 12/10/2016 1524   BUN 8.2 12/10/2016 1524   CREATININE 0.8 12/10/2016 1524   CALCIUM 9.2 12/10/2016 1524   PROT 6.0 (L) 12/10/2016 1524   ALBUMIN 3.4 (L) 12/10/2016 1524   AST 11 12/10/2016 1524   ALT 14 12/10/2016 1524   ALKPHOS 115 12/10/2016 1524   BILITOT 0.25 12/10/2016 1524   GFRNONAA >60 11/23/2016 1152   GFRAA >60 11/23/2016 1152    No results found for: TOTALPROTELP, ALBUMINELP, A1GS, A2GS, BETS, BETA2SER, GAMS, MSPIKE, SPEI  No results found for: KPAFRELGTCHN, LAMBDASER, KAPLAMBRATIO  Lab Results  Component Value Date   WBC 3.7 (L) 12/10/2016   NEUTROABS 2.5 12/10/2016   HGB 9.4 (L) 12/10/2016   HCT 29.0 (L) 12/10/2016   MCV 77.1 (L) 12/10/2016   PLT 277 12/10/2016      Chemistry      Component Value Date/Time   NA 137 12/10/2016 1524   K 3.9 12/10/2016 1524   CL 105 11/23/2016 1152   CO2 25 12/10/2016 1524   BUN 8.2 12/10/2016 1524   CREATININE 0.8 12/10/2016 1524      Component Value Date/Time   CALCIUM 9.2 12/10/2016 1524   ALKPHOS 115 12/10/2016 1524   AST 11 12/10/2016 1524   ALT 14 12/10/2016 1524   BILITOT 0.25 12/10/2016 1524       No results found for: LABCA2  No components found  for: HTVGVS254  No results for input(s): INR in the last 168 hours.  Urinalysis     Component Value Date/Time   BILIRUBINUR n 09/29/2016 1045   PROTEINUR trace 09/29/2016 1045   UROBILINOGEN negative 09/29/2016 1045   NITRITE n 09/29/2016 1045   LEUKOCYTESUR Negative 09/29/2016 1045     STUDIES: No results found.  ELIGIBLE FOR AVAILABLE RESEARCH PROTOCOL: not a candidate for PREVENT  ASSESSMENT: 41 y.o.  [place] woman status post right breast upper inner quadrant biopsy 10/16/2016 for a clinical T1b pN0, stage IB invasive ductal carcinoma, grade 3, triple negative, with an MIB-1 of 80%.  (1)  genetics testing scheduled for 12/01/2016  (2) neoadjuvant chemotherapy to consist of doxorubicin and cyclophosphamide in dose dense fashion 4 starting 11/05/2016 followed by weekly Abraxane 12  (3) breast conserving surgery to follow  (4) adjuvant radiation to follow chemotherapy     (5) extensive right upper extremity deep venous thrombosis documented 11/23/2016, treated initially with Lovenox  (a) transitioned to rivaroxaban as of 11/30/2016  (b) total 6 months anticoagulation planned (one month beyond port removal)  (6) tobacco abuse: The patient quit smoking 11/26/2016  PLAN: Lonita tolerated her third cycle of chemotherapy generally well, although she is experiencing more fatigue. Her counts remain excellent. I do not think we need to delay her final cycle of cyclophosphamide and doxorubicin.  Unfortunately her husband Kelly Mendez smokes about 2-1/2 packs per day. He is a Administrator and he does not feel there is any way he can not smoke while driving. We discussed Chantix but he has read up on side effects and that is discouraging to him.  I do think Rosealie is fatigue would be helped if she could make it to the gym. The patient's daughter does go to the gym regularly and she will try to take Syniah there with her.  Otherwise Ashton will see me again in a week at the time of her fourth chemotherapy cycle. I will have to see her in the treatment area at that time  since there is no availability in the clinic  She knows to call for any problems that may develop before the next visit. Chauncey Cruel, MD   12/10/2016 4:38 PM Medical Oncology and Hematology [facility] [address] [place], [place] [address] Tel. [phone]    Fax. [phone]

## 2016-12-15 ENCOUNTER — Encounter: Payer: Self-pay | Admitting: Genetic Counselor

## 2016-12-15 ENCOUNTER — Emergency Department (HOSPITAL_COMMUNITY): Payer: Managed Care, Other (non HMO)

## 2016-12-15 ENCOUNTER — Other Ambulatory Visit: Payer: Self-pay

## 2016-12-15 ENCOUNTER — Telehealth: Payer: Self-pay | Admitting: Genetic Counselor

## 2016-12-15 DIAGNOSIS — Z7901 Long term (current) use of anticoagulants: Secondary | ICD-10-CM | POA: Diagnosis not present

## 2016-12-15 DIAGNOSIS — N839 Noninflammatory disorder of ovary, fallopian tube and broad ligament, unspecified: Secondary | ICD-10-CM | POA: Insufficient documentation

## 2016-12-15 DIAGNOSIS — D649 Anemia, unspecified: Secondary | ICD-10-CM | POA: Diagnosis not present

## 2016-12-15 DIAGNOSIS — N939 Abnormal uterine and vaginal bleeding, unspecified: Secondary | ICD-10-CM | POA: Diagnosis present

## 2016-12-15 DIAGNOSIS — J45909 Unspecified asthma, uncomplicated: Secondary | ICD-10-CM | POA: Insufficient documentation

## 2016-12-15 DIAGNOSIS — E119 Type 2 diabetes mellitus without complications: Secondary | ICD-10-CM | POA: Diagnosis not present

## 2016-12-15 DIAGNOSIS — Z7984 Long term (current) use of oral hypoglycemic drugs: Secondary | ICD-10-CM | POA: Diagnosis not present

## 2016-12-15 DIAGNOSIS — N9489 Other specified conditions associated with female genital organs and menstrual cycle: Secondary | ICD-10-CM | POA: Diagnosis not present

## 2016-12-15 DIAGNOSIS — Z853 Personal history of malignant neoplasm of breast: Secondary | ICD-10-CM | POA: Diagnosis not present

## 2016-12-15 DIAGNOSIS — Z79899 Other long term (current) drug therapy: Secondary | ICD-10-CM | POA: Insufficient documentation

## 2016-12-15 DIAGNOSIS — Z0000 Encounter for general adult medical examination without abnormal findings: Secondary | ICD-10-CM | POA: Insufficient documentation

## 2016-12-15 DIAGNOSIS — Z1379 Encounter for other screening for genetic and chromosomal anomalies: Secondary | ICD-10-CM | POA: Insufficient documentation

## 2016-12-15 NOTE — ED Triage Notes (Addendum)
Patient reports heavy vaginal bleeding with large clots since 5/19. Reports changing a super plus tampon every hour. States she takes xarelto, hx DVT. Reports worsening SOB on exertion and pressure in chest today. Ambulatory to triage. Hx breast cancer. Reports last chemo 5/23.

## 2016-12-15 NOTE — ED Notes (Signed)
I attempted to collect labs and was unsuccessful. 

## 2016-12-15 NOTE — Telephone Encounter (Signed)
LM on VM with good news.  Asked that she CB. 

## 2016-12-16 ENCOUNTER — Inpatient Hospital Stay (HOSPITAL_COMMUNITY)
Admission: RE | Admit: 2016-12-16 | Payer: Managed Care, Other (non HMO) | Source: Ambulatory Visit | Admitting: Internal Medicine

## 2016-12-16 ENCOUNTER — Observation Stay (HOSPITAL_BASED_OUTPATIENT_CLINIC_OR_DEPARTMENT_OTHER)
Admit: 2016-12-16 | Discharge: 2016-12-16 | Disposition: A | Discharge status: Home / Self Care | Payer: Managed Care, Other (non HMO) | Attending: Internal Medicine | Admitting: Internal Medicine

## 2016-12-16 ENCOUNTER — Observation Stay (HOSPITAL_COMMUNITY)
Admission: EM | Admit: 2016-12-16 | Discharge: 2016-12-18 | Disposition: A | Discharge status: Home / Self Care | Payer: Managed Care, Other (non HMO) | Attending: Internal Medicine | Admitting: Internal Medicine

## 2016-12-16 ENCOUNTER — Emergency Department (HOSPITAL_COMMUNITY): Payer: Managed Care, Other (non HMO)

## 2016-12-16 ENCOUNTER — Encounter (HOSPITAL_COMMUNITY): Payer: Self-pay | Admitting: Internal Medicine

## 2016-12-16 ENCOUNTER — Ambulatory Visit (HOSPITAL_COMMUNITY): Admission: RE | Admit: 2016-12-16 | Payer: Managed Care, Other (non HMO) | Source: Ambulatory Visit

## 2016-12-16 ENCOUNTER — Telehealth: Payer: Self-pay | Admitting: Genetic Counselor

## 2016-12-16 DIAGNOSIS — Z7901 Long term (current) use of anticoagulants: Secondary | ICD-10-CM

## 2016-12-16 DIAGNOSIS — I82621 Acute embolism and thrombosis of deep veins of right upper extremity: Secondary | ICD-10-CM | POA: Diagnosis not present

## 2016-12-16 DIAGNOSIS — E786 Lipoprotein deficiency: Secondary | ICD-10-CM | POA: Diagnosis present

## 2016-12-16 DIAGNOSIS — Z171 Estrogen receptor negative status [ER-]: Secondary | ICD-10-CM

## 2016-12-16 DIAGNOSIS — N841 Polyp of cervix uteri: Secondary | ICD-10-CM

## 2016-12-16 DIAGNOSIS — D62 Acute posthemorrhagic anemia: Secondary | ICD-10-CM | POA: Diagnosis not present

## 2016-12-16 DIAGNOSIS — N949 Unspecified condition associated with female genital organs and menstrual cycle: Secondary | ICD-10-CM | POA: Diagnosis not present

## 2016-12-16 DIAGNOSIS — N921 Excessive and frequent menstruation with irregular cycle: Secondary | ICD-10-CM

## 2016-12-16 DIAGNOSIS — C50211 Malignant neoplasm of upper-inner quadrant of right female breast: Secondary | ICD-10-CM

## 2016-12-16 DIAGNOSIS — E118 Type 2 diabetes mellitus with unspecified complications: Secondary | ICD-10-CM

## 2016-12-16 DIAGNOSIS — N889 Noninflammatory disorder of cervix uteri, unspecified: Secondary | ICD-10-CM

## 2016-12-16 DIAGNOSIS — N839 Noninflammatory disorder of ovary, fallopian tube and broad ligament, unspecified: Secondary | ICD-10-CM

## 2016-12-16 DIAGNOSIS — D649 Anemia, unspecified: Secondary | ICD-10-CM | POA: Diagnosis present

## 2016-12-16 DIAGNOSIS — Z86718 Personal history of other venous thrombosis and embolism: Secondary | ICD-10-CM

## 2016-12-16 DIAGNOSIS — Z853 Personal history of malignant neoplasm of breast: Secondary | ICD-10-CM

## 2016-12-16 DIAGNOSIS — R58 Hemorrhage, not elsewhere classified: Secondary | ICD-10-CM

## 2016-12-16 DIAGNOSIS — I82A11 Acute embolism and thrombosis of right axillary vein: Secondary | ICD-10-CM | POA: Diagnosis present

## 2016-12-16 DIAGNOSIS — E1140 Type 2 diabetes mellitus with diabetic neuropathy, unspecified: Secondary | ICD-10-CM | POA: Diagnosis present

## 2016-12-16 DIAGNOSIS — N939 Abnormal uterine and vaginal bleeding, unspecified: Secondary | ICD-10-CM | POA: Diagnosis not present

## 2016-12-16 DIAGNOSIS — R000 Tachycardia, unspecified: Secondary | ICD-10-CM

## 2016-12-16 DIAGNOSIS — N888 Other specified noninflammatory disorders of cervix uteri: Secondary | ICD-10-CM | POA: Diagnosis not present

## 2016-12-16 DIAGNOSIS — E1165 Type 2 diabetes mellitus with hyperglycemia: Secondary | ICD-10-CM | POA: Diagnosis not present

## 2016-12-16 DIAGNOSIS — Z87410 Personal history of cervical dysplasia: Secondary | ICD-10-CM

## 2016-12-16 DIAGNOSIS — N920 Excessive and frequent menstruation with regular cycle: Secondary | ICD-10-CM

## 2016-12-16 DIAGNOSIS — E1142 Type 2 diabetes mellitus with diabetic polyneuropathy: Secondary | ICD-10-CM | POA: Diagnosis present

## 2016-12-16 DIAGNOSIS — N838 Other noninflammatory disorders of ovary, fallopian tube and broad ligament: Secondary | ICD-10-CM

## 2016-12-16 DIAGNOSIS — N9489 Other specified conditions associated with female genital organs and menstrual cycle: Secondary | ICD-10-CM

## 2016-12-16 LAB — BASIC METABOLIC PANEL
Anion gap: 7 (ref 5–15)
BUN: 9 mg/dL (ref 6–20)
CO2: 25 mmol/L (ref 22–32)
Calcium: 8.7 mg/dL — ABNORMAL LOW (ref 8.9–10.3)
Chloride: 106 mmol/L (ref 101–111)
Creatinine, Ser: 0.71 mg/dL (ref 0.44–1.00)
GFR calc Af Amer: >60 mL/min (ref >60)
GFR calc non Af Amer: >60 mL/min (ref >60)
Glucose, Bld: 179 mg/dL — ABNORMAL HIGH (ref 65–99)
Potassium: 4 mmol/L (ref 3.5–5.1)
Sodium: 138 mmol/L (ref 135–145)

## 2016-12-16 LAB — CBC
HCT: 23.6 % — ABNORMAL LOW (ref 36.0–46.0)
Hemoglobin: 7.7 g/dL — ABNORMAL LOW (ref 12.0–15.0)
MCH: 25.8 pg — ABNORMAL LOW (ref 26.0–34.0)
MCHC: 32.6 g/dL (ref 30.0–36.0)
MCV: 79.2 fL (ref 78.0–100.0)
Platelets: 172 10*3/uL (ref 150–400)
RBC: 2.98 MIL/uL — ABNORMAL LOW (ref 3.87–5.11)
RDW: 18.3 % — ABNORMAL HIGH (ref 11.5–15.5)
WBC: 14.3 10*3/uL — ABNORMAL HIGH (ref 4.0–10.5)

## 2016-12-16 LAB — POCT I-STAT TROPONIN I: Troponin i, poc: 0.03 ng/mL (ref 0.00–0.08)

## 2016-12-16 LAB — GLUCOSE, CAPILLARY
Glucose-Capillary: 147 mg/dL — ABNORMAL HIGH (ref 65–99)
Glucose-Capillary: 167 mg/dL — ABNORMAL HIGH (ref 65–99)

## 2016-12-16 LAB — PREPARE RBC (CROSSMATCH)

## 2016-12-16 LAB — HEMOGLOBIN AND HEMATOCRIT, BLOOD
HCT: 23.4 % — ABNORMAL LOW (ref 36.0–46.0)
Hemoglobin: 7.9 g/dL — ABNORMAL LOW (ref 12.0–15.0)

## 2016-12-16 LAB — ABO/RH: ABO/RH(D): O POS

## 2016-12-16 LAB — HCG, QUANTITATIVE, PREGNANCY: hCG, Beta Chain, Quant, S: 1 m[IU]/mL (ref <5)

## 2016-12-16 MED ORDER — MELATONIN 10 MG PO TABS: 10.0000 mg | ORAL_TABLET | Freq: Every day | ORAL | Status: DC

## 2016-12-16 MED ORDER — ACETAMINOPHEN 325 MG PO TABS: 650.0000 mg | ORAL_TABLET | Freq: Four times a day (QID) | ORAL | Status: DC | PRN

## 2016-12-16 MED ORDER — SODIUM CHLORIDE 0.9 % IV BOLUS (SEPSIS)
1000.0000 mL | Freq: Once | INTRAVENOUS | Status: AC
  Administered 2016-12-16: 1000 mL via INTRAVENOUS

## 2016-12-16 MED ORDER — POLYETHYLENE GLYCOL 3350 17 G PO PACK: 17.0000 g | PACK | Freq: Every day | ORAL | Status: DC | PRN

## 2016-12-16 MED ORDER — ENOXAPARIN SODIUM 80 MG/0.8ML ~~LOC~~ SOLN
80.0000 mg | SUBCUTANEOUS | Status: DC
  Administered 2016-12-16 – 2016-12-18 (×3): 80 mg via SUBCUTANEOUS
  Filled 2016-12-16 (×3): qty 0.8

## 2016-12-16 MED ORDER — METFORMIN HCL 500 MG PO TABS
1000.0000 mg | ORAL_TABLET | Freq: Two times a day (BID) | ORAL | Status: DC
  Administered 2016-12-16 – 2016-12-18 (×4): 1000 mg via ORAL
  Filled 2016-12-16 (×4): qty 2

## 2016-12-16 MED ORDER — SODIUM CHLORIDE 0.9 % IV SOLN: 10.0000 mL/h | Freq: Once | INTRAVENOUS | Status: DC

## 2016-12-16 MED ORDER — FERROUS SULFATE 325 (65 FE) MG PO TABS
325.0000 mg | ORAL_TABLET | Freq: Two times a day (BID) | ORAL | Status: DC
  Administered 2016-12-16 – 2016-12-18 (×4): 325 mg via ORAL
  Filled 2016-12-16 (×4): qty 1

## 2016-12-16 MED ORDER — HEPARIN SOD (PORK) LOCK FLUSH 100 UNIT/ML IV SOLN
500.0000 [IU] | Freq: Once | INTRAVENOUS | Status: DC
  Filled 2016-12-16: qty 5

## 2016-12-16 MED ORDER — GOSERELIN ACETATE 10.8 MG ~~LOC~~ IMPL
3.6000 mg | DRUG_IMPLANT | Freq: Once | SUBCUTANEOUS | Status: AC
  Administered 2016-12-16: 3.6 mg via SUBCUTANEOUS
  Filled 2016-12-16: qty 10.8

## 2016-12-16 MED ORDER — PROCHLORPERAZINE MALEATE 10 MG PO TABS: 10.0000 mg | ORAL_TABLET | Freq: Four times a day (QID) | ORAL | Status: DC | PRN

## 2016-12-16 MED ORDER — ACETAMINOPHEN 650 MG RE SUPP: 650.0000 mg | Freq: Four times a day (QID) | RECTAL | Status: DC | PRN

## 2016-12-16 MED ORDER — ATORVASTATIN CALCIUM 20 MG PO TABS
20.0000 mg | ORAL_TABLET | Freq: Every day | ORAL | Status: DC
  Administered 2016-12-16 – 2016-12-18 (×3): 20 mg via ORAL
  Filled 2016-12-16 (×3): qty 1

## 2016-12-16 MED ORDER — PROMETHAZINE HCL 25 MG/ML IJ SOLN: 25.0000 mg | Freq: Once | INTRAMUSCULAR | Status: DC

## 2016-12-16 MED ORDER — ONDANSETRON HCL 8 MG PO TABS: 8.0000 mg | ORAL_TABLET | Freq: Three times a day (TID) | ORAL | Status: DC | PRN

## 2016-12-16 NOTE — ED Provider Notes (Signed)
[facility] DEPT Provider Note   CSN: [id number] Arrival date & time: 12/15/16  2208  By signing my name below, I, Kelly Mendez, attest that this documentation has been prepared under the direction and in the presence of Tinnie Kunin, Barbette Hair, MD. Electronically Signed: Lise Auer, ED Scribe. 12/16/16. 12:49 AM.  History   Chief Complaint Chief Complaint  Patient presents with  . Vaginal Bleeding  . Shortness of Breath   The history is provided by the patient. No language interpreter was used.    HPI HPI Comments: Kelly Mendez is a 41 y.o. female with a PMHx of DM II and breast cancer, who presents to the Emergency Department complaining of gradually worsening, persistent vaginal bleeding that began five days ago. She notes associated shortness of breath, fatigue, and palpitations. Pt notes she started her period on 5/19 and has been bleeding ever since. Five days ago, she began to pass blood clots that range from the size of a "grape to a golf ball". Secondary to the bleeding, she notes an increasing amount of shortness of breath with exertion and extreme fatigue. Tonight she reports having palpitations once she arrived at the ED, she notes she possible has had the palpitations but due to her lack of movement she was unable to tell. Her last chemotherapy treatment was on 5/23 and she notes having her normal amount of fatigue but reports this fatigue she is currently feeling is more severe. Denies abdominal pain or any other acute associated symptoms.   Past Medical History:  Diagnosis Date  . Abnormal Pap smear of cervix   . Anxiety   . Asthma   . Cancer ([place])   . Concussion    around age 70  . Depression   . Diabetes type 2, uncontrolled ([place])    new diagnosis in 08/2016  . Family history of breast cancer   . Family history of neurofibromatosis   . Family history of ovarian cancer   . HPV in female   . Hyperlipemia   . PTSD (post-traumatic stress disorder)     Patient Active  Problem List   Diagnosis Date Noted  . Genetic testing 12/15/2016  . Family history of breast cancer   . Family history of ovarian cancer   . Family history of neurofibromatosis   . Acute deep vein thrombosis (DVT) of axillary vein of right upper extremity ([place]) 11/26/2016  . Diabetes type 2, uncontrolled ([place])   . Malignant neoplasm of upper-inner quadrant of right breast in female, estrogen receptor negative ([place]) 10/21/2016  . Low HDL (under 40) 09/16/2016  . ASCVD (arteriosclerotic cardiovascular disease) 09/16/2016  . Light cigarette smoker 09/15/2016  . New onset type 2 diabetes mellitus ([place]) 09/15/2016  . Morbid obesity ([place]) 09/15/2016  . Asthma 09/15/2016  . Trichomoniasis 09/15/2016    Past Surgical History:  Procedure Laterality Date  . COLPOSCOPY    . left ovary removed  1978  . PORTACATH PLACEMENT Right 10/29/2016   Procedure: INSERTION PORT-A-CATH WITH [place];  Surgeon: [name], MD;  Location: [facility];  Service: General;  Laterality: Right;    OB History    No data available       Home Medications    Prior to Admission medications   Medication Sig Start Date End Date Taking? Authorizing Provider  atorvastatin (LIPITOR) 20 MG tablet Take 1 tablet (20 mg total) by mouth daily. 09/16/16  Yes Henson, Vickie L, NP-C  Glucose Blood (BLOOD GLUCOSE TEST STRIPS) STRP Test twice a day. Pt  uses one touch verio flex meter 09/15/16  Yes Henson, Vickie L, NP-C  ibuprofen (ADVIL,MOTRIN) 200 MG tablet Take 400 mg by mouth daily as needed for headache.   Yes [provider]  lidocaine-prilocaine (EMLA) cream Apply to affected area once 10/31/16  Yes Magrinat, Virgie Dad, MD  magic mouthwash SOLN Take 5 mLs by mouth 4 (four) times daily. Patient taking differently: Take 5 mLs by mouth 4 (four) times daily as needed for mouth pain.  11/19/16  Yes Magrinat, Virgie Dad, MD  Melatonin 10 MG TABS Take 10 mg by mouth at bedtime.   Yes [provider]  metFORMIN (GLUCOPHAGE)  1000 MG tablet Take 1 tablet (1,000 mg total) by mouth 2 (two) times daily with a meal. 09/29/16  Yes Henson, Vickie L, NP-C  ondansetron (ZOFRAN) 8 MG tablet Take 1 tablet (8 mg total) by mouth every 8 (eight) hours as needed for nausea or vomiting. 12/03/16  Yes Magrinat, Virgie Dad, MD  [facility] DELICA LANCETS FINE MISC Test twice a day 09/15/16  Yes Henson, Vickie L, NP-C  prochlorperazine (COMPAZINE) 10 MG tablet Take 1 tablet (10 mg total) by mouth every 6 (six) hours as needed (Nausea or vomiting). 12/03/16  Yes Magrinat, Virgie Dad, MD  Rivaroxaban 15 & 20 MG TBPK Take as directed on package: Start with one 15mg  tablet by mouth twice a day with food. On Day 22, switch to one 20mg  tablet once a day with food. 11/26/16  Yes Magrinat, Virgie Dad, MD  fluconazole (DIFLUCAN) 200 MG tablet Take 1 tablet (200 mg total) by mouth daily. Patient not taking: Reported on 12/16/2016 11/19/16   Magrinat, Virgie Dad, MD  valACYclovir (VALTREX) 1000 MG tablet Take 1 tablet (1,000 mg total) by mouth daily. Patient not taking: Reported on 12/16/2016 11/19/16   Magrinat, Virgie Dad, MD    Family History Family History  Problem Relation Age of Onset  . Breast cancer Mother 56  . Hepatitis C Father   . Ovarian cancer Maternal Aunt        dx in her 26s  . Neurofibromatosis Maternal Uncle   . Brain cancer Maternal Uncle 58  . Lung cancer Maternal Grandfather   . Kidney failure Paternal Grandmother   . Heart attack Paternal Grandfather   . Ovarian cancer Maternal Aunt        dx in her 37s  . Neurofibromatosis Maternal Aunt   . Ovarian cancer Maternal Aunt   . Neurofibromatosis Maternal Aunt   . Cervical cancer Maternal Aunt   . Neurofibromatosis Maternal Aunt   . Cancer Maternal Aunt        cancer on the bottom of her foot  . Breast cancer Other        MGF's sisters  . Neurofibromatosis Other        MGM's paternal aunt    Social History Social History  Substance Use Topics  . Smoking status: Current Every Day  Smoker    Packs/day: 0.15    Years: 25.00    Types: Cigarettes  . Smokeless tobacco: Never Used  . Alcohol use 0.6 oz/week    1 Glasses of wine per week     Allergies   Penicillins   Review of Systems Review of Systems  Constitutional: Positive for fatigue.  Respiratory: Positive for shortness of breath.   Cardiovascular: Positive for palpitations.  Gastrointestinal: Negative for abdominal pain.  Genitourinary: Positive for vaginal bleeding.  All other systems reviewed and are negative.   Physical Exam  Updated Vital Signs BP 122/76 (BP Location: Left Wrist)   Pulse 95   Temp 98.1 F (36.7 C) (Oral)   Resp 14   Ht 6\' 2"  (1.88 m)   Wt (!) 428.9 kg (945 lb 8 oz)   LMP 11/29/2016   SpO2 100%   BMI 121.40 kg/m   Physical Exam  Constitutional: She is oriented to person, place, and time. She appears well-developed and well-nourished.  Obese, no acute distress  HENT:  Head: Normocephalic and atraumatic.  Cardiovascular: Normal rate, regular rhythm and normal heart sounds.   No murmur heard. Pulmonary/Chest: Effort normal and breath sounds normal. No respiratory distress. She has no wheezes.  Abdominal: Soft. Bowel sounds are normal. There is no tenderness. There is no guarding.  Genitourinary:  Genitourinary Comments: Vaginal exam deferred for patient comfort  Neurological: She is alert and oriented to person, place, and time.  Skin: Skin is warm and dry.  Psychiatric: She has a normal mood and affect.  Nursing note and vitals reviewed.    ED Treatments / Results  DIAGNOSTIC STUDIES: Oxygen Saturation is 99% on RA, normal by my interpretation.   COORDINATION OF CARE: 12:25 AM-Discussed next steps with pt. Pt verbalized understanding and is agreeable with the plan.   Labs (all labs ordered are listed, but only abnormal results are displayed) Labs Reviewed  BASIC METABOLIC PANEL - Abnormal; Notable for the following:       Result Value   Glucose, Bld 179  (*)    Calcium 8.7 (*)    All other components within normal limits  CBC - Abnormal; Notable for the following:    WBC 14.3 (*)    RBC 2.98 (*)    Hemoglobin 7.7 (*)    HCT 23.6 (*)    MCH 25.8 (*)    RDW 18.3 (*)    All other components within normal limits  HCG, QUANTITATIVE, PREGNANCY  I-STAT TROPOININ, ED  POCT I-STAT TROPONIN I  TYPE AND SCREEN  PREPARE RBC (CROSSMATCH)  ABO/RH    EKG  EKG Interpretation  Date/Time:  Monday December 15 2016 22:38:42 EDT Ventricular Rate:  110 PR Interval:    QRS Duration: 97 QT Interval:  331 QTC Calculation: 448 R Axis:   43 Text Interpretation:  Sinus tachycardia Abnormal R-wave progression, early transition Confirmed by Thayer Jew [phone]) on 12/16/2016 6:57:42 AM       Radiology Dg Chest 2 View  Result Date: 12/15/2016 CLINICAL DATA:  Shortness of breath and chest pressure today. EXAM: CHEST  2 VIEW COMPARISON:  Chest radiograph 10/29/2016 FINDINGS: Tip of the right chest port in the proximal SVC, apparent retraction from prior exam likely secondary to differences in positioning with head turned to the left. The cardiomediastinal contours are normal. The lungs are clear. Pulmonary vasculature is normal. No consolidation, pleural effusion, or pneumothorax. No acute osseous abnormalities are seen. IMPRESSION: No acute abnormality. Electronically Signed   By: Jeb Levering M.D.   On: 12/15/2016 22:49   US Transvaginal Non-ob  Result Date: 12/16/2016 CLINICAL DATA:  Heavy vaginal bleeding. EXAM: TRANSABDOMINAL AND TRANSVAGINAL ULTRASOUND OF PELVIS TECHNIQUE: Both transabdominal and transvaginal ultrasound examinations of the pelvis were performed. Transabdominal technique was performed for global imaging of the pelvis including uterus, ovaries, adnexal regions, and pelvic cul-de-sac. It was necessary to proceed with endovaginal exam following the transabdominal exam to visualize the uterus and ovaries. COMPARISON:  None FINDINGS: Uterus  Measurements: 10.8 x 4.4 x 3.9 cm. Within the cervix is an  ovoid structure measuring 2.9 x 1.2 cm with some peripheral blood flow. Endometrium Obscured and not discretely defined. Right ovary Multi septated cystic lesion in the midline and right adnexa measures 12.4 x 8.9 x 12.8 cm. Internal septations measure between 3-10 mm. No definite ovarian tissue is seen. Some peripheral flow is noted about the cystic spaces. Left ovary Not visualized, surgically absent per report. Other findings No abnormal free fluid. IMPRESSION: 1. Large 12.8 cm multilobulated cystic mass in the right adnexa. Multiple internal septations, some of which appear thickened. Normal ovarian tissue or separate right ovary are not seen. Findings are concerning for ovarian neoplasm. Recommend surgical consultation. 2. Possible cervical polyp measuring 2.9 cm. This may be amenable to direct visualization. 3. The endometrium is not well-defined on the current exam. Electronically Signed   By: Jeb Levering M.D.   On: 12/16/2016 06:22   US Pelvis Limited  Result Date: 12/16/2016 CLINICAL DATA:  Heavy vaginal bleeding. EXAM: TRANSABDOMINAL AND TRANSVAGINAL ULTRASOUND OF PELVIS TECHNIQUE: Both transabdominal and transvaginal ultrasound examinations of the pelvis were performed. Transabdominal technique was performed for global imaging of the pelvis including uterus, ovaries, adnexal regions, and pelvic cul-de-sac. It was necessary to proceed with endovaginal exam following the transabdominal exam to visualize the uterus and ovaries. COMPARISON:  None FINDINGS: Uterus Measurements: 10.8 x 4.4 x 3.9 cm. Within the cervix is an ovoid structure measuring 2.9 x 1.2 cm with some peripheral blood flow. Endometrium Obscured and not discretely defined. Right ovary Multi septated cystic lesion in the midline and right adnexa measures 12.4 x 8.9 x 12.8 cm. Internal septations measure between 3-10 mm. No definite ovarian tissue is seen. Some peripheral flow  is noted about the cystic spaces. Left ovary Not visualized, surgically absent per report. Other findings No abnormal free fluid. IMPRESSION: 1. Large 12.8 cm multilobulated cystic mass in the right adnexa. Multiple internal septations, some of which appear thickened. Normal ovarian tissue or separate right ovary are not seen. Findings are concerning for ovarian neoplasm. Recommend surgical consultation. 2. Possible cervical polyp measuring 2.9 cm. This may be amenable to direct visualization. 3. The endometrium is not well-defined on the current exam. Electronically Signed   By: Jeb Levering M.D.   On: 12/16/2016 06:22    Procedures Procedures (including critical care time)  CRITICAL CARE Performed by: Merryl Hacker   Total critical care time: 50 minutes  Critical care time was exclusive of separately billable procedures and treating other patients.  Critical care was necessary to treat or prevent imminent or life-threatening deterioration.  Critical care was time spent personally by me on the following activities: development of treatment plan with patient and/or surrogate as well as nursing, discussions with consultants, evaluation of patient's response to treatment, examination of patient, obtaining history from patient or surrogate, ordering and performing treatments and interventions, ordering and review of laboratory studies, ordering and review of radiographic studies, pulse oximetry and re-evaluation of patient's condition.   Medications Ordered in ED Medications  heparin lock flush 100 unit/mL (not administered)  0.9 %  sodium chloride infusion (not administered)  sodium chloride 0.9 % bolus 1,000 mL (0 mLs Intravenous Stopped 12/16/16 0305)     Initial Impression / Assessment and Plan / ED Course  I have reviewed the triage vital signs and the nursing notes.  Pertinent labs & imaging results that were available during my care of the patient were reviewed by me and  considered in my medical decision making (see chart for details).  Patient presents with worsening vaginal bleeding, shortness of breath Thomas fatigue. Reports golf ball size clots and ongoing bleeding requiring tampon and pad change every hour. She is nontoxic. Mildly tachycardic. She is on Xarelto for DVT. Initially labwork obtained. Unfortunately there is delayed obtaining lab work secondary to difficulty with access. Patient was given fluids. Hemoglobin is 7.7. One month ago it was 13. Suspect some of the patient's shortness of breath is likely symptomatic anemia. Will transfuse. Patient was consented.  Unlikely PE given on therapeutic Xarelto. I discussed the patient with Dr. Irene Limbo.  He is recommending pelvic ultrasound to evaluate for other causes of bleeding. This could be just dysfunctional uterine bleeding. However, given her ongoing breast cancer, options for stabilization of the uterine lining are more limited. Ultrasound obtained. Patient does have a cervical polyp; however incidentally she was found to have a large right-sided ovarian mass approximately 13 cm concerning for neoplasm. Patient was updated regarding results. Elected to defer pelvic examination as she will need further evaluation and admission for new ovarian mass and ongoing bleeding. Discussed again with Dr. Irene Limbo, oncology. He recommends admission to hospitalist with oncology to consult. They will coordinate gynecology consultation.  Final Clinical Impressions(s) / ED Diagnoses   Final diagnoses:  Bleeding  Symptomatic anemia  Vaginal bleeding  History of breast cancer  Ovarian mass, right    New Prescriptions New Prescriptions   No medications on file   I personally performed the services described in this documentation, which was scribed in my presence. The recorded information has been reviewed and is accurate.     Merryl Hacker, MD 12/16/16 [phone]

## 2016-12-16 NOTE — Consult Note (Signed)
Gynecologic Oncology Consult  Kelly Mendez 41 y.o. female  Currently admitted for: Chief Complaint  Patient presents with  . Vaginal Bleeding  . Shortness of Breath    HPI:  Kelly Mendez is a 41 year old female currently admitted after presenting to the Emergency Department with shortness of breath and heavy vaginal bleeding.  Her menstrual cycle began on Nov 25, 2016 and was described as heavy, which was normal for her.  The bleeding continued and on June 1, she began noticing bright red clots with heavier bleeding.  She reports soaking through a super plus tampon and pad every hour to hour and a half.  She experienced shortness of breath on exertion, fatigue, and feeling cold intermittently.  Her activities of daily living have been limited due to her symptoms.   She is currently receiving neoadjuvant doxorubicin and cyclophosphamide followed by Abraxane for triple negative breast cancer.  She completed day 8 of cycle 3 of doxorubicin and cyclophosphamide on 12/10/16.  She was found to have a RUE DVT on 11/23/16 which she currently takes xarelto for.  Her period started on time on 11/29/16.    GYN history includes G3 P1, initial menses at age 47, periods regular but heavy.  Last pap in January 2018 reported as normal.  Denies history of anemia in the past.  Family history positive for ovarian cancer.  States she received results from genetic testing today with some inconclusive but negative for BRCA mutations.  History of left oophorectomy at age 68 months when having a hernia repair.         Review of Systems  Constitutional: Feels fatigued.  No fever, chills.  Cardiovascular: No chest pain. Positive for RUE edema that is improving, mild LE edema.  Pulmonary: No cough or wheeze.  Gastrointestinal: No nausea, vomiting, or diarrhea. No bright red blood per rectum or change in bowel movement.  Genitourinary: No frequency, urgency, or dysuria. Positive for heavy vaginal bleeding. Musculoskeletal:  No myalgia or joint pain. Neurologic: No weakness, numbness, or change in gait.  Psychology: Positive for anxiety and depression, intermittent insomnia.  Current Scheduled Meds:  . atorvastatin  20 mg Oral Daily  . enoxaparin (LOVENOX) injection  80 mg Subcutaneous Q24H  . ferrous sulfate  325 mg Oral BID WC  . goserelin  3.6 mg Subcutaneous Once  . heparin lock flush  500 Units Intracatheter Once  . metFORMIN  1,000 mg Oral BID WC    Allergy:  Allergies  Allergen Reactions  . Penicillins Other (See Comments)    As a child Has patient had a PCN reaction causing immediate rash, facial/tongue/throat swelling, SOB or lightheadedness with hypotension: unknown Has patient had a PCN reaction causing severe rash involving mucus membranes or skin necrosis: unknown Has patient had a PCN reaction that required hospitalization unknown Has patient had a PCN reaction occurring within the last 10 years: no If all of the above answers are "NO", then may proceed with Cephalosporin use.     Social Hx:   Social History   Social History  . Marital status: Married    Spouse name: N/A  . Number of children: N/A  . Years of education: N/A   Occupational History  . Not on file.   Social History Main Topics  . Smoking status: Former Smoker    Packs/day: 0.15    Years: 25.00    Types: Cigarettes    Quit date: 11/25/2016  . Smokeless tobacco: Never Used  . Alcohol use 0.6  oz/week    1 Glasses of wine per week  . Drug use: No  . Sexual activity: Not Currently    Birth control/ protection: Injection     Comment: husband vasectomy   Other Topics Concern  . Not on file   Social History Narrative  . No narrative on file    Past Surgical Hx:  Past Surgical History:  Procedure Laterality Date  . COLPOSCOPY    . left ovary removed  1978  . PORTACATH PLACEMENT Right 10/29/2016   Procedure: INSERTION PORT-A-CATH WITH [place];  Surgeon: [name], MD;  Location: [facility];  Service: General;   Laterality: Right;    Past Medical Hx:  Past Medical History:  Diagnosis Date  . Abnormal Pap smear of cervix   . Anxiety   . Asthma   . Cancer ([place])   . Concussion    around age 82  . Depression   . Diabetes type 2, uncontrolled ([place])    new diagnosis in 08/2016  . Family history of breast cancer   . Family history of neurofibromatosis   . Family history of ovarian cancer   . HPV in female   . Hyperlipemia   . PTSD (post-traumatic stress disorder)     Family Hx:  Family History  Problem Relation Age of Onset  . Breast cancer Mother 108  . Hepatitis C Father   . Ovarian cancer Maternal Aunt        dx in her 52s  . Neurofibromatosis Maternal Uncle   . Brain cancer Maternal Uncle 58  . Lung cancer Maternal Grandfather   . Kidney failure Paternal Grandmother   . Heart attack Paternal Grandfather   . Ovarian cancer Maternal Aunt        dx in her 16s  . Neurofibromatosis Maternal Aunt   . Ovarian cancer Maternal Aunt   . Neurofibromatosis Maternal Aunt   . Cervical cancer Maternal Aunt   . Neurofibromatosis Maternal Aunt   . Cancer Maternal Aunt        cancer on the bottom of her foot  . Breast cancer Other        MGF's sisters  . Neurofibromatosis Other        MGM's paternal aunt   Ultrasound transvag and pelvis on 12/16/16: FINDINGS: Uterus  Measurements: 10.8 x 4.4 x 3.9 cm. Within the cervix is an ovoid structure measuring 2.9 x 1.2 cm with some peripheral blood flow.  Endometrium: Obscured and not discretely defined.  Right ovary: Multi septated cystic lesion in the midline and right adnexa measures 12.4 x 8.9 x 12.8 cm. Internal septations measure between 3-10 mm. No definite ovarian tissue is seen. Some peripheral flow is noted about the cystic spaces.  Left ovary: Not visualized, surgically absent per report.  Other findings: No abnormal free fluid.  IMPRESSION: 1. Large 12.8 cm multilobulated cystic mass in the right adnexa. Multiple internal  septations, some of which appear thickened. Normal ovarian tissue or separate right ovary are not seen. Findings are concerning for ovarian neoplasm. Recommend surgical consultation. 2. Possible cervical polyp measuring 2.9 cm. This may be amenable to direct visualization. 3. The endometrium is not well-defined on the current exam.  Vitals:  14:10pm: 98.5 temp, 131/91 BP, 100% on RA, pulse 93, Resp 20.  Physical Exam:   Assessment/Plan:   Esly Selvage DEAL, NP 12/16/2016, 11:25 AM

## 2016-12-16 NOTE — ED Notes (Signed)
Stuck x2 x1 with ultrasound unsuccessful

## 2016-12-16 NOTE — Progress Notes (Signed)
CRITICAL VALUE ALERT  Critical Value:  Positive ultrasound  Date & Time Notied:  0853  Provider Notified: Dr. Sheran Fava   Orders Received/Actions taken: Notified

## 2016-12-16 NOTE — ED Notes (Signed)
MD at bedside. 

## 2016-12-16 NOTE — Progress Notes (Signed)
**  Preliminary report by tech**  Right upper extremity venous duplex complete. There is evidence of age indeterminate deep vein thrombosis involving internal jugular, subclavian, axillary, and brachial veins of the right upper extremity. There is also evidence of age indeterminate superficial vein thrombosis involving the basilic vein of the right upper extremity. Results were given to the patient's nurse, Pamala Hurry.  12/16/16 8:59 AM Kelly Mendez RVT

## 2016-12-16 NOTE — Progress Notes (Signed)

## 2016-12-16 NOTE — Progress Notes (Signed)
Kelly Mendez was admitted today with menometrorrhagia in the setting of anticoagulation for her upper extremity DVT. As part of her evaluation she had pelvic and transvaginal ultrasonography which showed a cystic lesion in the right adnexa measuring 12.8 cm with no definite right ovary seen. The endometrial was not well-defined but there was a 2+ centimeters endocervical mass noted.  The patient was brought to the gynecology oncology clinic where examination by Dr. Denman George found the cervix to be grossly normal, the uterus slightly globular an enlarged, with active bleeding from the os but not rank hemorrhage. Endocervical curetting was performed and an endometrial biopsy was obtained.  Given the bleeding and despite the persistence of extensive right upper extremity clot by Doppler ultrasonography today, I think it would be prudent to decrease anticoagulation to prophylactic dosed Lovenox.  We are going to control the bleeding with goserelin. The patient received the first dose today. This will be repeated monthly. The goal is primarily to stop menstruation, but this also will protect the patient's ovaries through chemotherapy, although that was not a gold the patient was interested in  I appreciate the excellent care Kelly Mendez is receiving from her admissions team.

## 2016-12-16 NOTE — ED Notes (Signed)
Main lab contacted to attempt blood draw

## 2016-12-16 NOTE — Progress Notes (Signed)
[place] for Lovenox Indication: VTE prophylaxis dose, current treatment for RUE DVT/Xarelto with uterine bleeding  Allergies  Allergen Reactions  . Penicillins Other (See Comments)    As a child Has patient had a PCN reaction causing immediate rash, facial/tongue/throat swelling, SOB or lightheadedness with hypotension: unknown Has patient had a PCN reaction causing severe rash involving mucus membranes or skin necrosis: unknown Has patient had a PCN reaction that required hospitalization unknown Has patient had a PCN reaction occurring within the last 10 years: no If all of the above answers are "NO", then may proceed with Cephalosporin use.    Patient Measurements: Height: 6\' 2"  (188 cm) Weight: (!) 345 lb (156.5 kg) IBW/kg (Calculated) : 77.7  Vital Signs: Temp: 97.9 F (36.6 C) (06/05 0915) Temp Source: Oral (06/05 0915) BP: 134/69 (06/05 0915) Pulse Rate: 92 (06/05 0915)  Labs:  Recent Labs  12/16/16 0230  HGB 7.7*  HCT 23.6*  PLT 172  CREATININE 0.71   Estimated Creatinine Clearance: 161.1 mL/min (by C-G formula based on SCr of 0.71 mg/dL).  Medical History: Past Medical History:  Diagnosis Date  . Abnormal Pap smear of cervix   . Anxiety   . Asthma   . Cancer ([place])   . Concussion    around age 41  . Depression   . Diabetes type 2, uncontrolled ([place])    new diagnosis in 08/2016  . Family history of breast cancer   . Family history of neurofibromatosis   . Family history of ovarian cancer   . HPV in female   . Hyperlipemia   . PTSD (post-traumatic stress disorder)    Medications:  Scheduled:  . atorvastatin  20 mg Oral Daily  . enoxaparin (LOVENOX) injection  80 mg Subcutaneous Q24H  . ferrous sulfate  325 mg Oral BID WC  . goserelin  3.6 mg Subcutaneous Once  . heparin lock flush  500 Units Intracatheter Once  . metFORMIN  1,000 mg Oral BID WC   Assessment: 41 yoF to ED 6/5 am with continued heavy  menorrhagia. Admit for transfusions, hx of recent RUE DVT treated with Xarelto, last dose 15mg  on 6/4 at 1800. HX breast Ca, current chemo.  TBW 345 lb, 156kg, renal fx wnl  Prophylactic Lovenox desired while inpatient with bleeding, dosing per Pharmacy  Goal of Therapy:  Monitor platelets by anticoagulation protocol: Yes   Plan:   Lovenox 80mg  SQ daily (0.5mg /kg/day)   Monitor CBC closely  Transition back to Xarelto when able  Minda Ditto PharmD Pager [phone] 12/16/2016, 11:07 AM

## 2016-12-16 NOTE — Telephone Encounter (Signed)
Revealed negative genetic testing.  Discussed that we do not know why she has breast cancer or why there is cancer in the family. It could be due to a different gene that we are not testing, or maybe our current technology may not be able to pick something up.  It will be important for her to keep in contact with genetics to keep up with whether additional testing may be needed. Revealed that there were several VUS identified, but that these should not be used to determine medical management.  Suggested that others in the family may want to consider genetic testing.  Patient revealed that several hours ago she had abnormal bleeding. She is in the process of being admitted with a mass on her ovary. Discussed that this testing would not explain this finding.

## 2016-12-16 NOTE — ED Notes (Signed)
Port accessed and flushed, not drawing blood

## 2016-12-16 NOTE — H&P (Addendum)
History and Physical    Kelly Mendez VOH:[id number] DOB: 10/28/1975 DOA: 12/16/2016  Referring MD/NP/PA: Thayer Jew PCP: [name], NP-C   Patient coming from: home  Chief Complaint: SOB, heavy period  HPI: Kelly Mendez is a 41 y.o. 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 Jana Hakim, metromenorrhagia, and acute DVT of the RUE diagnosed on 5/13, currently on xarelto, who presents with SOB and heavy period.  She started her period on 5/19 which was heavy but normal for her.  Her bleeding has not stopped and on June 1st, she started having heavier periods.  She soaks a super tampon and a nighttime super pad every three hours and has been passing clots the size of golf balls.  For the last few weeks, she has been feeling more cold than usual, fatigued and SOB, particularly with exertion.  She was not able to complete her shopping at [facility] because her legs felt week.  She denies fevers, cough, dysuria, or other signs of recently infection.  Her right arm swelling and discomfort have dramatically improved since she started xarelto.  She has noticed some mild increase in lower extremity swelling.    ED Course: Vital signs HR 90s-100s, otherwise stable.  Labs:  WBC 14.3, hemoglobin 7.7.   Hemoglobin was 13 g/dL in early May.    Review of Systems:  General:  Denies fevers, chills, weight change HEENT:  Denies changes to hearing and vision, sinus congestion, sore throat CV:  Denies chest pain, palpitations, positive lower extremity edema.  PULM:  Denies wheeze, cough.   GI:  Denies nausea, vomiting, constipation, diarrhea.   GU:  Denies dysuria, frequency, urgency ENDO:  Denies polyuria, polydipsia.   HEME:  Denies blood in stools, abnormal bruising LYMPH:  Denies lymphadenopathy.   MSK:  Denies arthralgias, myalgias.   DERM:  Denies skin rash or ulcer.     NEURO:  Denies new focal numbness or weakness, slurred speech, confusion PSYCH:  Denies anxiety and depression.    Past Medical History:  Diagnosis Date  . Abnormal Pap smear of cervix   . Anxiety   . Asthma   . Cancer ([place])   . Concussion    around age 86  . Depression   . Diabetes type 2, uncontrolled ([place])    new diagnosis in 08/2016  . Family history of breast cancer   . Family history of neurofibromatosis   . Family history of ovarian cancer   . HPV in female   . Hyperlipemia   . PTSD (post-traumatic stress disorder)     Past Surgical History:  Procedure Laterality Date  . COLPOSCOPY    . left ovary removed  1978  . PORTACATH PLACEMENT Right 10/29/2016   Procedure: INSERTION PORT-A-CATH WITH [place];  Surgeon: [name], MD;  Location: [facility];  Service: General;  Laterality: Right;     reports that she quit smoking about 3 weeks ago. Her smoking use included Cigarettes. She has a 3.75 pack-year smoking history. She has never used smokeless tobacco. She reports that she drinks about 0.6 oz of alcohol per week . She reports that she does not use drugs.  Allergies  Allergen Reactions  . Penicillins Other (See Comments)    As a child Has patient had a PCN reaction causing immediate rash, facial/tongue/throat swelling, SOB or lightheadedness with hypotension: unknown Has patient had a PCN reaction causing severe rash involving mucus  membranes or skin necrosis: unknown Has patient had a PCN reaction that required hospitalization unknown Has patient had a PCN reaction occurring within the last 10 years: no If all of the above answers are "NO", then may proceed with Cephalosporin use.     Family History  Problem Relation Age of Onset  . Breast cancer Mother 2  . Hepatitis C Father   . Ovarian cancer Maternal Aunt        dx in her 31s  . Neurofibromatosis Maternal Uncle   . Brain cancer Maternal Uncle 58  . Lung cancer Maternal Grandfather   . Kidney failure Paternal  Grandmother   . Heart attack Paternal Grandfather   . Ovarian cancer Maternal Aunt        dx in her 42s  . Neurofibromatosis Maternal Aunt   . Ovarian cancer Maternal Aunt   . Neurofibromatosis Maternal Aunt   . Cervical cancer Maternal Aunt   . Neurofibromatosis Maternal Aunt   . Cancer Maternal Aunt        cancer on the bottom of her foot  . Breast cancer Other        MGF's sisters  . Neurofibromatosis Other        MGM's paternal aunt    Prior to Admission medications   Medication Sig Start Date End Date Taking? Authorizing Provider  atorvastatin (LIPITOR) 20 MG tablet Take 1 tablet (20 mg total) by mouth daily. 09/16/16  Yes Henson, Vickie L, NP-C  Glucose Blood (BLOOD GLUCOSE TEST STRIPS) STRP Test twice a day. Pt uses one touch verio flex meter 09/15/16  Yes Henson, Vickie L, NP-C  ibuprofen (ADVIL,MOTRIN) 200 MG tablet Take 400 mg by mouth daily as needed for headache.   Yes [provider]  lidocaine-prilocaine (EMLA) cream Apply to affected area once 10/31/16  Yes Magrinat, Virgie Dad, MD  magic mouthwash SOLN Take 5 mLs by mouth 4 (four) times daily. Patient taking differently: Take 5 mLs by mouth 4 (four) times daily as needed for mouth pain.  11/19/16  Yes Magrinat, Virgie Dad, MD  Melatonin 10 MG TABS Take 10 mg by mouth at bedtime.   Yes [provider]  metFORMIN (GLUCOPHAGE) 1000 MG tablet Take 1 tablet (1,000 mg total) by mouth 2 (two) times daily with a meal. 09/29/16  Yes Henson, Vickie L, NP-C  ondansetron (ZOFRAN) 8 MG tablet Take 1 tablet (8 mg total) by mouth every 8 (eight) hours as needed for nausea or vomiting. 12/03/16  Yes Magrinat, Virgie Dad, MD  [facility] [address] DELICA LANCETS FINE MISC Test twice a day 09/15/16  Yes Henson, Vickie L, NP-C  prochlorperazine (COMPAZINE) 10 MG tablet Take 1 tablet (10 mg total) by mouth every 6 (six) hours as needed (Nausea or vomiting). 12/03/16  Yes Magrinat, Virgie Dad, MD  Rivaroxaban 15 & 20 MG TBPK Take as directed on package:  Start with one 15mg  tablet by mouth twice a day with food. On Day 22, switch to one 20mg  tablet once a day with food. 11/26/16  Yes Magrinat, Virgie Dad, MD  fluconazole (DIFLUCAN) 200 MG tablet Take 1 tablet (200 mg total) by mouth daily. Patient not taking: Reported on 12/16/2016 11/19/16   Magrinat, Virgie Dad, MD  valACYclovir (VALTREX) 1000 MG tablet Take 1 tablet (1,000 mg total) by mouth daily. Patient not taking: Reported on 12/16/2016 11/19/16   Chauncey Cruel, MD    Physical Exam: Vitals:   12/16/16 5366 12/16/16 4403 12/16/16 0605 12/16/16 0646  BP: 112/60  104/60 119/70 122/76  Pulse: 100 100 (!) 105 95  Resp: 20 17 16 14   Temp:  98.3 F (36.8 C) 98.1 F (36.7 C)   TempSrc:  Oral Oral   SpO2: 100% 100% 100% 100%  Weight:      Height:        Constitutional:  Pale, NAD, calm, comfortable Eyes: PERRL, lids and conjunctivae pale  ENMT:  Moist mucous membranes.  Oropharynx nonerythematous, no exudates.   Neck:  No nuchal rigidity, no masses Respiratory:  No wheezes, rales, or rhonchi Cardiovascular:  Tachycardic, regular rhythm, no murmurs / rubs / gallops.  2+ radial pulses. Abdomen:  Normal active bowel sounds, soft, nondistended, nontender Musculoskeletal: Normal muscle tone and bulk.  No contractures.  Trace bilateral lower extremity edema.   Skin:  no rashes, abrasions, or ulcers Neurologic:  CN 2-12 grossly intact. Sensation intact to light touch, strength 5/5 throughout Psychiatric:  Alert and oriented x 3. Normal affect.   Labs on Admission: I have personally reviewed following labs and imaging studies  CBC:  Recent Labs Lab 12/10/16 1524 12/16/16 0230  WBC 3.7* 14.3*  NEUTROABS 2.5  --   HGB 9.4* 7.7*  HCT 29.0* 23.6*  MCV 77.1* 79.2  PLT 277 433   Basic Metabolic Panel:  Recent Labs Lab 12/10/16 1524 12/16/16 0230  NA 137 138  K 3.9 4.0  CL  --  106  CO2 25 25  GLUCOSE 150* 179*  BUN 8.2 9  CREATININE 0.8 0.71  CALCIUM 9.2 8.7*   GFR: Estimated  Creatinine Clearance: 322 mL/min (by C-G formula based on SCr of 0.71 mg/dL). Liver Function Tests:  Recent Labs Lab 12/10/16 1524  AST 11  ALT 14  ALKPHOS 115  BILITOT 0.25  PROT 6.0*  ALBUMIN 3.4*   No results for input(s): LIPASE, AMYLASE in the last 168 hours. No results for input(s): AMMONIA in the last 168 hours. Coagulation Profile: No results for input(s): INR, PROTIME in the last 168 hours. Cardiac Enzymes: No results for input(s): CKTOTAL, CKMB, CKMBINDEX, TROPONINI in the last 168 hours. BNP (last 3 results) No results for input(s): PROBNP in the last 8760 hours. HbA1C: No results for input(s): HGBA1C in the last 72 hours. CBG: No results for input(s): GLUCAP in the last 168 hours. Lipid Profile: No results for input(s): CHOL, HDL, LDLCALC, TRIG, CHOLHDL, LDLDIRECT in the last 72 hours. Thyroid Function Tests: No results for input(s): TSH, T4TOTAL, FREET4, T3FREE, THYROIDAB in the last 72 hours. Anemia Panel: No results for input(s): VITAMINB12, FOLATE, FERRITIN, TIBC, IRON, RETICCTPCT in the last 72 hours. Urine analysis:    Component Value Date/Time   BILIRUBINUR n 09/29/2016 1045   PROTEINUR trace 09/29/2016 1045   UROBILINOGEN negative 09/29/2016 1045   NITRITE n 09/29/2016 1045   LEUKOCYTESUR Negative 09/29/2016 1045   Sepsis Labs: @LABRCNTIP (procalcitonin:4,lacticidven:4) )No results found for this or any previous visit (from the past 240 hour(s)).   Radiological Exams on Admission: Dg Chest 2 View  Result Date: 12/15/2016 CLINICAL DATA:  Shortness of breath and chest pressure today. EXAM: CHEST  2 VIEW COMPARISON:  Chest radiograph 10/29/2016 FINDINGS: Tip of the right chest port in the proximal SVC, apparent retraction from prior exam likely secondary to differences in positioning with head turned to the left. The cardiomediastinal contours are normal. The lungs are clear. Pulmonary vasculature is normal. No consolidation, pleural effusion, or  pneumothorax. No acute osseous abnormalities are seen. IMPRESSION: No acute abnormality. Electronically Signed   By: Threasa Beards  Ehinger M.D.   On: 12/15/2016 22:49   US Transvaginal Non-ob  Result Date: 12/16/2016 CLINICAL DATA:  Heavy vaginal bleeding. EXAM: TRANSABDOMINAL AND TRANSVAGINAL ULTRASOUND OF PELVIS TECHNIQUE: Both transabdominal and transvaginal ultrasound examinations of the pelvis were performed. Transabdominal technique was performed for global imaging of the pelvis including uterus, ovaries, adnexal regions, and pelvic cul-de-sac. It was necessary to proceed with endovaginal exam following the transabdominal exam to visualize the uterus and ovaries. COMPARISON:  None FINDINGS: Uterus Measurements: 10.8 x 4.4 x 3.9 cm. Within the cervix is an ovoid structure measuring 2.9 x 1.2 cm with some peripheral blood flow. Endometrium Obscured and not discretely defined. Right ovary Multi septated cystic lesion in the midline and right adnexa measures 12.4 x 8.9 x 12.8 cm. Internal septations measure between 3-10 mm. No definite ovarian tissue is seen. Some peripheral flow is noted about the cystic spaces. Left ovary Not visualized, surgically absent per report. Other findings No abnormal free fluid. IMPRESSION: 1. Large 12.8 cm multilobulated cystic mass in the right adnexa. Multiple internal septations, some of which appear thickened. Normal ovarian tissue or separate right ovary are not seen. Findings are concerning for ovarian neoplasm. Recommend surgical consultation. 2. Possible cervical polyp measuring 2.9 cm. This may be amenable to direct visualization. 3. The endometrium is not well-defined on the current exam. Electronically Signed   By: Jeb Levering M.D.   On: 12/16/2016 06:22   US Pelvis Limited  Result Date: 12/16/2016 CLINICAL DATA:  Heavy vaginal bleeding. EXAM: TRANSABDOMINAL AND TRANSVAGINAL ULTRASOUND OF PELVIS TECHNIQUE: Both transabdominal and transvaginal ultrasound examinations  of the pelvis were performed. Transabdominal technique was performed for global imaging of the pelvis including uterus, ovaries, adnexal regions, and pelvic cul-de-sac. It was necessary to proceed with endovaginal exam following the transabdominal exam to visualize the uterus and ovaries. COMPARISON:  None FINDINGS: Uterus Measurements: 10.8 x 4.4 x 3.9 cm. Within the cervix is an ovoid structure measuring 2.9 x 1.2 cm with some peripheral blood flow. Endometrium Obscured and not discretely defined. Right ovary Multi septated cystic lesion in the midline and right adnexa measures 12.4 x 8.9 x 12.8 cm. Internal septations measure between 3-10 mm. No definite ovarian tissue is seen. Some peripheral flow is noted about the cystic spaces. Left ovary Not visualized, surgically absent per report. Other findings No abnormal free fluid. IMPRESSION: 1. Large 12.8 cm multilobulated cystic mass in the right adnexa. Multiple internal septations, some of which appear thickened. Normal ovarian tissue or separate right ovary are not seen. Findings are concerning for ovarian neoplasm. Recommend surgical consultation. 2. Possible cervical polyp measuring 2.9 cm. This may be amenable to direct visualization. 3. The endometrium is not well-defined on the current exam. Electronically Signed   By: Jeb Levering M.D.   On: 12/16/2016 06:22    EKG: Independently reviewed. Sinus tachycardia   Assessment/Plan Principal Problem:   Symptomatic anemia Active Problems:   Low HDL (under 40)   Malignant neoplasm of upper-inner quadrant of right breast in female, estrogen receptor negative ([place])   Diabetes type 2, uncontrolled ([facility])   Acute deep vein thrombosis (DVT) of axillary vein of right upper extremity ([facility])   Acute blood loss anemia   Sinus tachycardia   Adnexal mass, RIGHT   Cervical polyp   Menorrhagia   Symptomatic anemia/acute blood loss anemia likely secondary to heavy menses in the setting of anticoagulation.   Sinus tachycardia and symptomatic SOB likely secondary to anemia -  Endometrial stripe is normal -  Transfused 1 unit PRBC in the ER -  Repeat hgb post transfusion -  No hormonal/steroid treatments at this time due to underlying malignancy and newly discovered ovarian mass -  Hold xarelto this morning -  Duplex RUE to determine extent of clot burden:  Still extensive DVT -  Start iron supplementation  Sinus tachycardia, doubt PE as her HR is already improving with blood transfusion, she has been on anticoagulation and her RUE has improved dramatically since starting A/C a few weeks ago.  No work up for PE at this time.    DOE and lower extremity edema, on doxorubicin -  ECHO   Right adnexal mass 12.8cm with septations and 2.9 cm cervical polyp, diagnosed via pelvic ultrasound in ER: -  Case discussed with Dr. Denman George, GYN-ONC who will follow up as outpatient   Triple negative ductal carcinoma of the right breast undergoing neoadjuvant chemotherapy by Dr. Jana Hakim, now with a newly discovered large 12.8cm lobulated cystic mass in the right adnexa concerning for ovarian neoplasm and a possible 2.9cm cervical polyp.   -  Dr. Jana Hakim to consult -  Cycle 4 of chemotherapy planned for 6/6 with doxorubicin and cyclophosphamide  Leukocytosis  - last neulasta dose was on 5/25  Diabetes mellitus type 2, uncontrolled.  A1c 8.8% 09/15/2016 -  Continue metformin   Dyslipidemia, stable -  Continue atrovastatin  DVT prophylaxis: SCDs Code Status: full code Family Communication:  Patient alone Disposition Plan: she would like to go home as soon as possible   Consults called: on phone with Jacobus.  Dr. Jana Hakim added to rounding list and case discussed with Dr. Irene Limbo by ER MD who was on call overnight.    Admission status: observation, med-surg.     Janece Canterbury MD [facility] Pager [phone]  If 7PM-7AM, please contact night-coverage [email or web address] Password [facility]  12/16/2016, 8:47  AM

## 2016-12-16 NOTE — Progress Notes (Signed)
Dr. Sheran Fava paged and made aware of patient arrival to the unit

## 2016-12-16 NOTE — ED Notes (Signed)
Vascular Ultrasound at bedside.  

## 2016-12-17 ENCOUNTER — Other Ambulatory Visit: Payer: Managed Care, Other (non HMO)

## 2016-12-17 ENCOUNTER — Ambulatory Visit: Payer: Managed Care, Other (non HMO)

## 2016-12-17 ENCOUNTER — Ambulatory Visit: Payer: Managed Care, Other (non HMO) | Admitting: Oncology

## 2016-12-17 ENCOUNTER — Observation Stay (HOSPITAL_BASED_OUTPATIENT_CLINIC_OR_DEPARTMENT_OTHER): Payer: Managed Care, Other (non HMO)

## 2016-12-17 DIAGNOSIS — D649 Anemia, unspecified: Secondary | ICD-10-CM

## 2016-12-17 DIAGNOSIS — I360 Nonrheumatic tricuspid (valve) stenosis: Secondary | ICD-10-CM

## 2016-12-17 LAB — ECHOCARDIOGRAM COMPLETE
E decel time: 250 msec
E/e' ratio: 6.86
FS: 33 % (ref 28–44)
Height: 74 in
IVS/LV PW RATIO, ED: 1.2
LA ID, A-P, ES: 26 mm
LA diam end sys: 26 mm
LA diam index: 0.96 cm/m2
LA vol A4C: 38.3 ml
LA vol index: 18.6 mL/m2
LA vol: 50.5 mL
LV E/e' medial: 6.86
LV E/e'average: 6.86
LV PW d: 10.9 mm — AB (ref 0.6–1.1)
LV dias vol index: 37 mL/m2
LV dias vol: 100 mL (ref 46–106)
LV e' LATERAL: 12.4 cm/s
LV sys vol index: 16 mL/m2
LV sys vol: 42 mL (ref 14–42)
LVOT SV: 59 mL
LVOT VTI: 23.1 cm
LVOT area: 2.54 cm2
LVOT diameter: 18 mm
LVOT peak grad rest: 6 mmHg
LVOT peak vel: 126 cm/s
Lateral S' vel: 16 cm/s
MV Dec: 250
MV Peak grad: 3 mmHg
MV pk A vel: 118 m/s
MV pk E vel: 85.1 m/s
Simpson's disk: 58
Stroke v: 57 ml
TAPSE: 26.5 mm
TDI e' lateral: 12.4
TDI e' medial: 7.83
Weight: 5446.24 oz

## 2016-12-17 LAB — BASIC METABOLIC PANEL
Anion gap: 6 (ref 5–15)
BUN: 6 mg/dL (ref 6–20)
CO2: 25 mmol/L (ref 22–32)
Calcium: 8.5 mg/dL — ABNORMAL LOW (ref 8.9–10.3)
Chloride: 109 mmol/L (ref 101–111)
Creatinine, Ser: 0.63 mg/dL (ref 0.44–1.00)
GFR calc Af Amer: >60 mL/min (ref >60)
GFR calc non Af Amer: >60 mL/min (ref >60)
Glucose, Bld: 162 mg/dL — ABNORMAL HIGH (ref 65–99)
Potassium: 3.9 mmol/L (ref 3.5–5.1)
Sodium: 140 mmol/L (ref 135–145)

## 2016-12-17 LAB — TYPE AND SCREEN
ABO/RH(D): O POS
Antibody Screen: NEGATIVE
Unit division: 0

## 2016-12-17 LAB — GLUCOSE, CAPILLARY
Glucose-Capillary: 162 mg/dL — ABNORMAL HIGH (ref 65–99)
Glucose-Capillary: 154 mg/dL — ABNORMAL HIGH (ref 65–99)
Glucose-Capillary: 142 mg/dL — ABNORMAL HIGH (ref 65–99)

## 2016-12-17 LAB — BPAM RBC
Blood Product Expiration Date: 201806202359
ISSUE DATE / TIME: 201806050538
Unit Type and Rh: 5100

## 2016-12-17 LAB — CBC
HCT: 24.1 % — ABNORMAL LOW (ref 36.0–46.0)
Hemoglobin: 8 g/dL — ABNORMAL LOW (ref 12.0–15.0)
MCH: 27.4 pg (ref 26.0–34.0)
MCHC: 33.2 g/dL (ref 30.0–36.0)
MCV: 82.5 fL (ref 78.0–100.0)
Platelets: 153 10*3/uL (ref 150–400)
RBC: 2.92 MIL/uL — ABNORMAL LOW (ref 3.87–5.11)
RDW: 19.5 % — ABNORMAL HIGH (ref 11.5–15.5)
WBC: 12.9 10*3/uL — ABNORMAL HIGH (ref 4.0–10.5)

## 2016-12-17 LAB — PREPARE RBC (CROSSMATCH)

## 2016-12-17 MED ORDER — SODIUM CHLORIDE 0.9 % IV SOLN
Freq: Once | INTRAVENOUS | Status: AC
  Administered 2016-12-17: 16:00:00 via INTRAVENOUS

## 2016-12-17 NOTE — Progress Notes (Signed)
Patient ID: Kelly Mendez, female   DOB: 03/12/1976, 41 y.o.   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 Patient presented with main concern of progressively worsening fatigue, weakness, exertional dyspnea and heavy menstrual cycle.  Assessment/Plan:  Acute blood loss anemia, symptomatic anemia in the setting of heavy menses, anticoagulation with Xarelto  - Patient with abnormal cyclic uterine bleeding  - Has been transfused 1 unit of PRBC in the emergency department  - Hemoglobin somewhat better, trending up from 7.7 --> 8 this am - Appreciate his oncologist and GYN oncologist input - Pelvic ultrasound shows 12 cm cystic right ovarian mass, of note patient had left cystic mass removed as a child - Pelvic ultrasound also notable for 2+ centimeter endocervical mass - Per GYN oncologist, exam done and notable for uterus slightly globular, enlarged with active bleeding noted from the os but no frank hemorrhage - Endocervical curettage performed as well as endometrial biopsy obtained  - Since patient still symptomatic, plan on transfusing additional unit of PRBC today - Repeat CBC in the morning - Per oncology team recommendation, reduce anticoagulation to prophylactic dose Lovenox - Plan to control bleeding with Goserelin  - Check CEA-125  Dyspnea on exertion - Suspect it is related to acute blood loss anemia - Transfuse 1 unit of PRBC - Ambulating in hallway - Echo with stable EF 65%  Triple-negative ductal carcinoma of the right breast - Follows with Dr. Jana Hakim  Leukocytosis - Received Neulasta Dec 05, 2016 -  CBC in the morning  Diabetes type 2, A1c 8.8 on 09/15/2016 - Keep on metformin  DVT prophylaxis: Lovenox  Code Status: Full  Family Communication: Patient at bedside  Disposition Plan: home once medically stable   Consultants:   GYN oncologist    Oncologist   Procedures:  Right upper extremity venous Doppler 12/16/2016 - Evidence of age indeterminant DVT involving internal jugular, subclavian, axillary, brachial vein in the right upper extremity - Evidence of age indeterminant superficial vein thrombosis and basilic vein of the right upper extremity  Antimicrobials:   None  Subjective: Still feels tired and weak.   Objective: Vitals:   12/16/16 1000 12/16/16 1330 12/16/16 2130 12/17/16 0637  BP:  113/69 119/76 124/78  Pulse:  92 98 94  Resp:  12 14 14   Temp:  98.3 F (36.8 C) 99.3 F (37.4 C) 98.9 F (37.2 C)  TempSrc:  Oral Oral Oral  SpO2:  100% 100% 100%  Weight: (!) 156.5 kg (345 lb)   (!) 154.4 kg (340 lb 6.2 oz)  Height:  6\' 2"  (1.88 m)      Intake/Output Summary (Last 24 hours) at 12/17/16 1208 Last data filed at 12/17/16 0956  Gross per 24 hour  Intake             1200 ml  Output                0 ml  Net             1200 ml   Filed Weights   12/15/16 2212 12/16/16 1000 12/17/16 0637  Weight: (!) 428.9 kg (945 lb 8 oz) (!) 156.5 kg (345  lb) (!) 154.4 kg (340 lb 6.2 oz)    Examination:  General exam: Appears calm and comfortable  Respiratory system: Clear to auscultation. Respiratory effort normal. Cardiovascular system: S1 & S2 heard, RRR. No JVD, murmurs, rubs, gallops or clicks. No pedal edema. Gastrointestinal system: Abdomen is nondistended, soft and nontender. No organomegaly or masses felt.  Central nervous system: Alert and oriented. No focal neurological deficits. Extremities: Symmetric 5 x 5 power. Skin: No rashes, lesions or ulcers Psychiatry: Judgement and insight appear normal. Mood & affect appropriate.   Data Reviewed: I have  personally reviewed following labs and imaging studies  CBC:  Recent Labs Lab 12/10/16 1524 12/16/16 0230 12/16/16 1257 12/17/16 0414  WBC 3.7* 14.3*  --  12.9*  NEUTROABS 2.5  --   --   --   HGB 9.4* 7.7* 7.9* 8.0*  HCT 29.0* 23.6* 23.4* 24.1*  MCV 77.1* 79.2  --  82.5  PLT 277 172  --  161   Basic Metabolic Panel:  Recent Labs Lab 12/10/16 1524 12/16/16 0230 12/17/16 0414  NA 137 138 140  K 3.9 4.0 3.9  CL  --  106 109  CO2 25 25 25   GLUCOSE 150* 179* 162*  BUN 8.2 9 6   CREATININE 0.8 0.71 0.63  CALCIUM 9.2 8.7* 8.5*   Liver Function Tests:  Recent Labs Lab 12/10/16 1524  AST 11  ALT 14  ALKPHOS 115  BILITOT 0.25  PROT 6.0*  ALBUMIN 3.4*   CBG:  Recent Labs Lab 12/16/16 1552 12/16/16 2127 12/17/16 0720  GLUCAP 147* 167* 162*   Urine analysis:    Component Value Date/Time   BILIRUBINUR n 09/29/2016 1045   PROTEINUR trace 09/29/2016 1045   UROBILINOGEN negative 09/29/2016 1045   NITRITE n 09/29/2016 1045   LEUKOCYTESUR Negative 09/29/2016 1045   Radiology Studies: Dg Chest 2 View  Result Date: 12/15/2016 CLINICAL DATA:  Shortness of breath and chest pressure today. EXAM: CHEST  2 VIEW COMPARISON:  Chest radiograph 10/29/2016 FINDINGS: Tip of the right chest port in the proximal SVC, apparent retraction from prior exam likely secondary to differences in positioning with head turned to the left. The cardiomediastinal contours are normal. The lungs are clear. Pulmonary vasculature is normal. No consolidation, pleural effusion, or pneumothorax. No acute osseous abnormalities are seen. IMPRESSION: No acute abnormality. Electronically Signed   By: Jeb Levering M.D.   On: 12/15/2016 22:49   US Transvaginal Non-ob  Result Date: 12/16/2016 CLINICAL DATA:  Heavy vaginal bleeding. EXAM: TRANSABDOMINAL AND TRANSVAGINAL ULTRASOUND OF PELVIS TECHNIQUE: Both transabdominal and transvaginal ultrasound examinations of the pelvis were performed. Transabdominal  technique was performed for global imaging of the pelvis including uterus, ovaries, adnexal regions, and pelvic cul-de-sac. It was necessary to proceed with endovaginal exam following the transabdominal exam to visualize the uterus and ovaries. COMPARISON:  None FINDINGS: Uterus Measurements: 10.8 x 4.4 x 3.9 cm. Within the cervix is an ovoid structure measuring 2.9 x 1.2 cm with some peripheral blood flow. Endometrium Obscured and not discretely defined. Right ovary Multi septated cystic lesion in the midline and right adnexa measures 12.4 x 8.9 x 12.8 cm. Internal septations measure between 3-10 mm. No definite ovarian tissue is seen. Some peripheral flow is noted about the cystic spaces. Left ovary Not visualized, surgically absent per report. Other findings No abnormal free fluid. IMPRESSION: 1. Large 12.8 cm multilobulated cystic mass in the right adnexa. Multiple internal septations, some of which appear thickened. Normal ovarian tissue or separate right  ovary are not seen. Findings are concerning for ovarian neoplasm. Recommend surgical consultation. 2. Possible cervical polyp measuring 2.9 cm. This may be amenable to direct visualization. 3. The endometrium is not well-defined on the current exam. Electronically Signed   By: Jeb Levering M.D.   On: 12/16/2016 06:22   US Pelvis Limited  Result Date: 12/16/2016 CLINICAL DATA:  Heavy vaginal bleeding. EXAM: TRANSABDOMINAL AND TRANSVAGINAL ULTRASOUND OF PELVIS TECHNIQUE: Both transabdominal and transvaginal ultrasound examinations of the pelvis were performed. Transabdominal technique was performed for global imaging of the pelvis including uterus, ovaries, adnexal regions, and pelvic cul-de-sac. It was necessary to proceed with endovaginal exam following the transabdominal exam to visualize the uterus and ovaries. COMPARISON:  None FINDINGS: Uterus Measurements: 10.8 x 4.4 x 3.9 cm. Within the cervix is an ovoid structure measuring 2.9 x 1.2 cm with some  peripheral blood flow. Endometrium Obscured and not discretely defined. Right ovary Multi septated cystic lesion in the midline and right adnexa measures 12.4 x 8.9 x 12.8 cm. Internal septations measure between 3-10 mm. No definite ovarian tissue is seen. Some peripheral flow is noted about the cystic spaces. Left ovary Not visualized, surgically absent per report. Other findings No abnormal free fluid. IMPRESSION: 1. Large 12.8 cm multilobulated cystic mass in the right adnexa. Multiple internal septations, some of which appear thickened. Normal ovarian tissue or separate right ovary are not seen. Findings are concerning for ovarian neoplasm. Recommend surgical consultation. 2. Possible cervical polyp measuring 2.9 cm. This may be amenable to direct visualization. 3. The endometrium is not well-defined on the current exam. Electronically Signed   By: Jeb Levering M.D.   On: 12/16/2016 06:22   Scheduled Meds: . atorvastatin  20 mg Oral Daily  . enoxaparin (LOVENOX) injection  80 mg Subcutaneous Q24H  . ferrous sulfate  325 mg Oral BID WC  . heparin lock flush  500 Units Intracatheter Once  . metFORMIN  1,000 mg Oral BID WC   Continuous Infusions: . sodium chloride      LOS: 0 days   Time spent: 35 minutes   Faye Ramsay, MD [facility] Pager [phone]  If 7PM-7AM, please contact night-coverage [email or web address] Password [email or web address] 12/17/2016, 12:08 PM

## 2016-12-17 NOTE — Progress Notes (Signed)
  Echocardiogram 2D Echocardiogram has been performed.  Kelly Mendez 12/17/2016, 9:49 AM

## 2016-12-18 DIAGNOSIS — D649 Anemia, unspecified: Secondary | ICD-10-CM | POA: Diagnosis not present

## 2016-12-18 LAB — BPAM RBC
Blood Product Expiration Date: 201806252359
ISSUE DATE / TIME: 201806061543
Unit Type and Rh: 5100

## 2016-12-18 LAB — BASIC METABOLIC PANEL
Anion gap: 6 (ref 5–15)
BUN: 6 mg/dL (ref 6–20)
CO2: 26 mmol/L (ref 22–32)
Calcium: 8.4 mg/dL — ABNORMAL LOW (ref 8.9–10.3)
Chloride: 107 mmol/L (ref 101–111)
Creatinine, Ser: 0.67 mg/dL (ref 0.44–1.00)
GFR calc Af Amer: >60 mL/min (ref >60)
GFR calc non Af Amer: >60 mL/min (ref >60)
Glucose, Bld: 171 mg/dL — ABNORMAL HIGH (ref 65–99)
Potassium: 3.8 mmol/L (ref 3.5–5.1)
Sodium: 139 mmol/L (ref 135–145)

## 2016-12-18 LAB — CBC
HCT: 24.6 % — ABNORMAL LOW (ref 36.0–46.0)
Hemoglobin: 8.1 g/dL — ABNORMAL LOW (ref 12.0–15.0)
MCH: 27.1 pg (ref 26.0–34.0)
MCHC: 32.9 g/dL (ref 30.0–36.0)
MCV: 82.3 fL (ref 78.0–100.0)
Platelets: 157 10*3/uL (ref 150–400)
RBC: 2.99 MIL/uL — ABNORMAL LOW (ref 3.87–5.11)
RDW: 19.3 % — ABNORMAL HIGH (ref 11.5–15.5)
WBC: 12.5 10*3/uL — ABNORMAL HIGH (ref 4.0–10.5)

## 2016-12-18 LAB — TYPE AND SCREEN
ABO/RH(D): O POS
Antibody Screen: NEGATIVE
Unit division: 0

## 2016-12-18 LAB — GLUCOSE, CAPILLARY: Glucose-Capillary: 158 mg/dL — ABNORMAL HIGH (ref 65–99)

## 2016-12-18 LAB — CA 125: CA 125: 14.2 U/mL (ref 0.0–38.1)

## 2016-12-18 MED ORDER — HEPARIN SOD (PORK) LOCK FLUSH 100 UNIT/ML IV SOLN
500.0000 [IU] | INTRAVENOUS | Status: DC | PRN
  Filled 2016-12-18: qty 5

## 2016-12-18 MED ORDER — HEPARIN SOD (PORK) LOCK FLUSH 100 UNIT/ML IV SOLN
500.0000 [IU] | INTRAVENOUS | Status: DC
  Administered 2016-12-18: 500 [IU]

## 2016-12-18 MED ORDER — FERROUS SULFATE 325 (65 FE) MG PO TABS: 325.0000 mg | ORAL_TABLET | Freq: Two times a day (BID) | ORAL | 3 refills | Status: DC

## 2016-12-18 NOTE — Progress Notes (Signed)
Pt discharged home in stable condition. Discharge instructions given. Pt verbalized understanding. No immediate questions or concerns at this time. Pt discharged from unit via wheelchair.  

## 2016-12-18 NOTE — Discharge Summary (Signed)
Physician Discharge Summary  Kelly Mendez BPZ:[id number] DOB: 1976-06-25 DOA: 12/16/2016  PCP: [name], NP-C  Admit date: 12/16/2016 Discharge date: 12/18/2016  Recommendations for Outpatient Follow-up:  1. Pt will need to follow up with PCP in 1 week post discharge 2. Please obtain BMP to evaluate electrolytes and kidney function 3. Please also check CBC to evaluate Hg and Hct levels 4. Pt also advised to immediately notify PCP of further bleeding as pt is on Xarelto  5. Pt was advised to continue taking Xarelto as bleeding has stopped  6. Pt is aware she needs to follow up with her GYN  Discharge Diagnoses:  Principal Problem:   Symptomatic anemia Active Problems:   Low HDL (under 40)   Malignant neoplasm of upper-inner quadrant of right breast in female, estrogen receptor negative ([place])   Diabetes type 2, uncontrolled ([facility])   Acute deep vein thrombosis (DVT) of axillary vein of right upper extremity ([facility])   Acute blood loss anemia   Sinus tachycardia   Adnexal mass, RIGHT   Cervical polyp   Menorrhagia  Discharge Condition: Stable  Diet recommendation: Heart healthy diet discussed in details   History of present illness:  The patient is 42 year old female with known diabetes type 2, depression, 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 Patient presented with main concern of progressively worsening fatigue, weakness, exertional dyspnea and heavy menstrual cycle.  Assessment/Plan:  Acute blood loss anemia, symptomatic anemia in the setting of heavy menses, anticoagulation with Xarelto  - Patient with abnormal cyclic uterine bleeding  - pelvic US showed 12 cm cystic mass on the right ovary, 2 cm endocervical mass - endocervical curettage done, endometrial bx obtained  - Has been transfused 1 unit of PRBC in  the emergency department  - additional unit of PRBC transfused 6/6 - pt reports bleeding has now stopped and she feels better - she wants to go home - CEA 125 pending on discharge  - goserelin injection given while pt in the hospital, per oncologist Dr. Jana Hakim this will be repeated monthly   Dyspnea on exertion - Suspect it is related to acute blood loss anemia - resolved with stabilization of Hg  Triple-negative ductal carcinoma of the right breast - Follows with Dr. Jana Hakim  Leukocytosis - Received Neulasta Dec 05, 2016  Diabetes type 2, A1c 8.8 on 09/15/2016 - resume home regimen   DVT prophylaxis: Lovenox while inpatient, resume Xarelto on discharge  Code Status: Full  Family Communication: patient at bedside  Disposition Plan: home  Consultants:   GYN oncologist    Oncologist   Procedures:  Right upper extremity venous Doppler 12/16/2016 - Evidence of age indeterminant DVT involving internal jugular, subclavian, axillary, brachial vein in the right upper extremity - Evidence of age indeterminant superficial vein thrombosis and basilic vein of the right upper extremity  Antimicrobials:   None  Procedures/Studies: Dg Chest 2 View  Result Date: 12/15/2016 CLINICAL DATA:  Shortness of breath and chest pressure today. EXAM: CHEST  2 VIEW COMPARISON:  Chest radiograph 10/29/2016 FINDINGS: Tip of the right chest port in the proximal SVC, apparent retraction from prior exam likely secondary to differences in positioning with head turned to the left. The cardiomediastinal contours are normal. The lungs are clear. Pulmonary vasculature is normal. No consolidation, pleural effusion, or pneumothorax. No acute osseous abnormalities are seen. IMPRESSION: No acute abnormality. Electronically  Signed   By: Jeb Levering M.D.   On: 12/15/2016 22:49   US Transvaginal Non-ob  Result Date: 12/16/2016 CLINICAL DATA:  Heavy vaginal bleeding. EXAM: TRANSABDOMINAL AND  TRANSVAGINAL ULTRASOUND OF PELVIS TECHNIQUE: Both transabdominal and transvaginal ultrasound examinations of the pelvis were performed. Transabdominal technique was performed for global imaging of the pelvis including uterus, ovaries, adnexal regions, and pelvic cul-de-sac. It was necessary to proceed with endovaginal exam following the transabdominal exam to visualize the uterus and ovaries. COMPARISON:  None FINDINGS: Uterus Measurements: 10.8 x 4.4 x 3.9 cm. Within the cervix is an ovoid structure measuring 2.9 x 1.2 cm with some peripheral blood flow. Endometrium Obscured and not discretely defined. Right ovary Multi septated cystic lesion in the midline and right adnexa measures 12.4 x 8.9 x 12.8 cm. Internal septations measure between 3-10 mm. No definite ovarian tissue is seen. Some peripheral flow is noted about the cystic spaces. Left ovary Not visualized, surgically absent per report. Other findings No abnormal free fluid. IMPRESSION: 1. Large 12.8 cm multilobulated cystic mass in the right adnexa. Multiple internal septations, some of which appear thickened. Normal ovarian tissue or separate right ovary are not seen. Findings are concerning for ovarian neoplasm. Recommend surgical consultation. 2. Possible cervical polyp measuring 2.9 cm. This may be amenable to direct visualization. 3. The endometrium is not well-defined on the current exam. Electronically Signed   By: Jeb Levering M.D.   On: 12/16/2016 06:22   US Pelvis Limited  Result Date: 12/16/2016 CLINICAL DATA:  Heavy vaginal bleeding. EXAM: TRANSABDOMINAL AND TRANSVAGINAL ULTRASOUND OF PELVIS TECHNIQUE: Both transabdominal and transvaginal ultrasound examinations of the pelvis were performed. Transabdominal technique was performed for global imaging of the pelvis including uterus, ovaries, adnexal regions, and pelvic cul-de-sac. It was necessary to proceed with endovaginal exam following the transabdominal exam to visualize the uterus and  ovaries. COMPARISON:  None FINDINGS: Uterus Measurements: 10.8 x 4.4 x 3.9 cm. Within the cervix is an ovoid structure measuring 2.9 x 1.2 cm with some peripheral blood flow. Endometrium Obscured and not discretely defined. Right ovary Multi septated cystic lesion in the midline and right adnexa measures 12.4 x 8.9 x 12.8 cm. Internal septations measure between 3-10 mm. No definite ovarian tissue is seen. Some peripheral flow is noted about the cystic spaces. Left ovary Not visualized, surgically absent per report. Other findings No abnormal free fluid. IMPRESSION: 1. Large 12.8 cm multilobulated cystic mass in the right adnexa. Multiple internal septations, some of which appear thickened. Normal ovarian tissue or separate right ovary are not seen. Findings are concerning for ovarian neoplasm. Recommend surgical consultation. 2. Possible cervical polyp measuring 2.9 cm. This may be amenable to direct visualization. 3. The endometrium is not well-defined on the current exam. Electronically Signed   By: Jeb Levering M.D.   On: 12/16/2016 06:22    Discharge Exam: Vitals:   12/17/16 2050 12/18/16 0521  BP: (!) 108/52 116/65  Pulse: 92 90  Resp: 16 16  Temp: 98.9 F (37.2 C) 99.8 F (37.7 C)   Vitals:   12/17/16 1824 12/17/16 2050 12/18/16 0521 12/18/16 0620  BP: 122/71 (!) 108/52 116/65   Pulse: 93 92 90   Resp: 14 16 16    Temp: 98 F (36.7 C) 98.9 F (37.2 C) 99.8 F (37.7 C)   TempSrc: Oral Oral Oral   SpO2: 100% 100% 100%   Weight:    (!) 161 kg (354 lb 15.1 oz)  Height:  General: Pt is alert, follows commands appropriately, not in acute distress Cardiovascular: Regular rate and rhythm, S1/S2 +, no murmurs, no rubs, no gallops Respiratory: Clear to auscultation bilaterally, no wheezing, no crackles, no rhonchi Abdominal: Soft, non tender, non distended, bowel sounds +, no guarding Extremities: no edema, no cyanosis, pulses palpable bilaterally DP and PT Neuro: Grossly  nonfocal  Discharge Instructions  Discharge Instructions    Diet - low sodium heart healthy    Complete by:  As directed    Increase activity slowly    Complete by:  As directed      Allergies as of 12/18/2016      Reactions   Penicillins Other (See Comments)   As a child Has patient had a PCN reaction causing immediate rash, facial/tongue/throat swelling, SOB or lightheadedness with hypotension: unknown Has patient had a PCN reaction causing severe rash involving mucus membranes or skin necrosis: unknown Has patient had a PCN reaction that required hospitalization unknown Has patient had a PCN reaction occurring within the last 10 years: no If all of the above answers are "NO", then may proceed with Cephalosporin use.      Medication List    TAKE these medications   atorvastatin 20 MG tablet Commonly known as:  LIPITOR Take 1 tablet (20 mg total) by mouth daily.   BLOOD GLUCOSE TEST STRIPS Strp Test twice a day. Pt uses one touch verio flex meter   ferrous sulfate 325 (65 FE) MG tablet Take 1 tablet (325 mg total) by mouth 2 (two) times daily with a meal.   ibuprofen 200 MG tablet Commonly known as:  ADVIL,MOTRIN Take 400 mg by mouth daily as needed for headache.   lidocaine-prilocaine cream Commonly known as:  EMLA Apply to affected area once   magic mouthwash Soln Take 5 mLs by mouth 4 (four) times daily. What changed:  when to take this  reasons to take this   Melatonin 10 MG Tabs Take 10 mg by mouth at bedtime.   metFORMIN 1000 MG tablet Commonly known as:  GLUCOPHAGE Take 1 tablet (1,000 mg total) by mouth 2 (two) times daily with a meal.   ondansetron 8 MG tablet Commonly known as:  ZOFRAN Take 1 tablet (8 mg total) by mouth every 8 (eight) hours as needed for nausea or vomiting.   ONETOUCH DELICA LANCETS FINE Misc Test twice a day   prochlorperazine 10 MG tablet Commonly known as:  COMPAZINE Take 1 tablet (10 mg total) by mouth every 6 (six)  hours as needed (Nausea or vomiting).   Rivaroxaban 15 & 20 MG Tbpk Take as directed on package: Start with one 15mg  tablet by mouth twice a day with food. On Day 22, switch to one 20mg  tablet once a day with food.       Follow-up Information    Henson, Vickie L, NP-C Follow up.   Specialty:  Family Medicine Contact information: [address] [place] [id number] [phone]            The results of significant diagnostics from this hospitalization (including imaging, microbiology, ancillary and laboratory) are listed below for reference.     Microbiology: No results found for this or any previous visit (from the past 240 hour(s)).   Labs: Basic Metabolic Panel:  Recent Labs Lab 12/16/16 0230 12/17/16 0414 12/18/16 0500  NA 138 140 139  K 4.0 3.9 3.8  CL 106 109 107  CO2 25 25 26   GLUCOSE 179* 162* 171*  BUN  9 6 6   CREATININE 0.71 0.63 0.67  CALCIUM 8.7* 8.5* 8.4*   CBC:  Recent Labs Lab 12/16/16 0230 12/16/16 1257 12/17/16 0414 12/18/16 0500  WBC 14.3*  --  12.9* 12.5*  HGB 7.7* 7.9* 8.0* 8.1*  HCT 23.6* 23.4* 24.1* 24.6*  MCV 79.2  --  82.5 82.3  PLT 172  --  153 157   CBG:  Recent Labs Lab 12/16/16 2127 12/17/16 0720 12/17/16 1608 12/17/16 2118 12/18/16 0746  GLUCAP 167* 162* 154* 142* 158*    SIGNED: Time coordinating discharge:  30 minutes  Faye Ramsay, MD  [facility] 12/18/2016, 12:58 PM Pager [phone]  If 7PM-7AM, please contact night-coverage [email or web address] Password [email or web address]

## 2016-12-18 NOTE — Discharge Instructions (Signed)

## 2016-12-19 ENCOUNTER — Ambulatory Visit: Payer: Managed Care, Other (non HMO)

## 2016-12-22 ENCOUNTER — Other Ambulatory Visit: Payer: Self-pay | Admitting: Family Medicine

## 2016-12-22 ENCOUNTER — Ambulatory Visit: Payer: Managed Care, Other (non HMO) | Admitting: Oncology

## 2016-12-23 ENCOUNTER — Telehealth: Payer: Self-pay

## 2016-12-23 NOTE — Telephone Encounter (Signed)
Told Ms Atwood that the biopsies were benign per Dr. Denman George.  Set up a follow up appointment for 01-05-17 for follow up as requested by Dr. Denman George.

## 2016-12-25 ENCOUNTER — Ambulatory Visit (HOSPITAL_BASED_OUTPATIENT_CLINIC_OR_DEPARTMENT_OTHER): Payer: Managed Care, Other (non HMO) | Admitting: Oncology

## 2016-12-25 ENCOUNTER — Telehealth: Payer: Self-pay | Admitting: Gynecologic Oncology

## 2016-12-25 ENCOUNTER — Other Ambulatory Visit (HOSPITAL_BASED_OUTPATIENT_CLINIC_OR_DEPARTMENT_OTHER): Payer: Managed Care, Other (non HMO)

## 2016-12-25 VITALS — BP 137/95 | HR 94 | Temp 98.4°F | Resp 18 | Ht 74.0 in | Wt 341.3 lb

## 2016-12-25 DIAGNOSIS — C50211 Malignant neoplasm of upper-inner quadrant of right female breast: Secondary | ICD-10-CM | POA: Diagnosis not present

## 2016-12-25 DIAGNOSIS — E119 Type 2 diabetes mellitus without complications: Secondary | ICD-10-CM

## 2016-12-25 DIAGNOSIS — I82621 Acute embolism and thrombosis of deep veins of right upper extremity: Secondary | ICD-10-CM

## 2016-12-25 DIAGNOSIS — Z171 Estrogen receptor negative status [ER-]: Secondary | ICD-10-CM

## 2016-12-25 LAB — COMPREHENSIVE METABOLIC PANEL
ALT: 26 U/L (ref 0–55)
AST: 19 U/L (ref 5–34)
Albumin: 3.4 g/dL — ABNORMAL LOW (ref 3.5–5.0)
Alkaline Phosphatase: 105 U/L (ref 40–150)
Anion Gap: 7 mEq/L (ref 3–11)
BUN: 8.8 mg/dL (ref 7.0–26.0)
CO2: 24 mEq/L (ref 22–29)
Calcium: 9.7 mg/dL (ref 8.4–10.4)
Chloride: 108 mEq/L (ref 98–109)
Creatinine: 0.8 mg/dL (ref 0.6–1.1)
EGFR: >90 mL/min/{1.73_m2} (ref >90)
Glucose: 156 mg/dl — ABNORMAL HIGH (ref 70–140)
Potassium: 4 mEq/L (ref 3.5–5.1)
Sodium: 139 mEq/L (ref 136–145)
Total Bilirubin: 0.43 mg/dL (ref 0.20–1.20)
Total Protein: 6.3 g/dL — ABNORMAL LOW (ref 6.4–8.3)

## 2016-12-25 LAB — CBC WITH DIFFERENTIAL/PLATELET
BASO%: 0.7 % (ref 0.0–2.0)
Basophils Absolute: 0.1 10*3/uL (ref 0.0–0.1)
EOS%: 0.3 % (ref 0.0–7.0)
Eosinophils Absolute: 0 10*3/uL (ref 0.0–0.5)
HCT: 30.3 % — ABNORMAL LOW (ref 34.8–46.6)
HGB: 10 g/dL — ABNORMAL LOW (ref 11.6–15.9)
LYMPH%: 10 % — ABNORMAL LOW (ref 14.0–49.7)
MCH: 27.2 pg (ref 25.1–34.0)
MCHC: 33 g/dL (ref 31.5–36.0)
MCV: 82.5 fL (ref 79.5–101.0)
MONO#: 0.6 10*3/uL (ref 0.1–0.9)
MONO%: 5.9 % (ref 0.0–14.0)
NEUT#: 9 10*3/uL — ABNORMAL HIGH (ref 1.5–6.5)
NEUT%: 83.1 % — ABNORMAL HIGH (ref 38.4–76.8)
Platelets: 391 10*3/uL (ref 145–400)
RBC: 3.68 10*6/uL — ABNORMAL LOW (ref 3.70–5.45)
RDW: 22.1 % — ABNORMAL HIGH (ref 11.2–14.5)
WBC: 10.8 10*3/uL — ABNORMAL HIGH (ref 3.9–10.3)
lymph#: 1.1 10*3/uL (ref 0.9–3.3)

## 2016-12-25 NOTE — Progress Notes (Signed)
[place]  Telephone:([phone]) [phone] Fax:(336) [phone]     ID: Kelly Mendez DOB: 02/06/1976  MR#: [id number]  MEQ#:[id number]  Patient Care Team: [name], NP-C as PCP - General (Family Medicine) [name], MD as Consulting Physician (General Surgery) Magrinat, Virgie Dad, MD as Consulting Physician (Oncology) Eppie Gibson, MD as Attending Physician (Radiation Oncology) Chauncey Cruel, MD OTHER MD:  CHIEF COMPLAINT: Triple negative breast cancer  CURRENT TREATMENT: Neoadjuvant chemotherapy   BREAST CANCER HISTORY: From the original intake note:  Kelly Mendez had her first ever mammogram 10/07/2016 at the [facility]. This showed a possible mass in the right breast. On 10/13/2016 she underwent bilateral diagnostic mammography with tomography and right breast ultrasonography. This found the breast density to be category B. In the upper inner quadrant of the right breast there was a circumscribed mass which was barely palpable. Ultrasonography confirmed a 0.9 cm right breast mass at the 1:00 radiant 18 cm from the nipple ultrasound of the axilla showed 1 indeterminate right axillary lymph node with borderline thickening of the anterior cortex.  On 10/16/2016 the patient underwent biopsy of the right breast mass and the suspicious right axillary lymph node. The right breast mass proved to be an invasive ductal carcinoma, grade 3, estrogen and progesterone receptor negative, with an MIB-1 of 80%, and no HER-2 amplification, the signals ratio being 1.30 and the number per cell 1.75. The lymph node was negative and concordant.  Her subsequent history is as detailed below  INTERVAL HISTORY: Kelly Mendez returns today for follow-up and treatment of her estrogen receptor negative breast cancer, accompanied by her mother.  She was supposed to have received her fourth cycle of Cytoxan and Adriamycin this week, but her treatments were interrupted by her severe  menometrorrhagia. She had ultrasounds transvaginal and pelvic showing what appeared to be a cervical polyp and also a large right adnexal cystic mass of unclear etiology. She had endometrial biopsy and endocervical curettage both with benign pathology ([id number]). She received a first dose of goserelin 12/16/2016. She has had no further menstrual bleeding since 12/18/2016  REVIEW OF SYSTEMS: Kelly Mendez has more energy, more appetite, and her sense of taste is coming back. She has had no nausea or vomiting problems. Bowel movements are normal for her, soft every other day. She remains very fatigued. A detailed review of systems today was otherwise stable  PAST MEDICAL HISTORY: Past Medical History:  Diagnosis Date  . Abnormal Pap smear of cervix   . Anxiety   . Asthma   . Cancer ([place])   . Concussion    around age 89  . Depression   . Diabetes type 2, uncontrolled ([place])    new diagnosis in 08/2016  . Family history of breast cancer   . Family history of neurofibromatosis   . Family history of ovarian cancer   . HPV in female   . Hyperlipemia   . PTSD (post-traumatic stress disorder)     PAST SURGICAL HISTORY: Past Surgical History:  Procedure Laterality Date  . COLPOSCOPY    . left ovary removed  1978  . PORTACATH PLACEMENT Right 10/29/2016   Procedure: INSERTION PORT-A-CATH WITH [place];  Surgeon: [name], MD;  Location: [facility];  Service: General;  Laterality: Right;    FAMILY HISTORY Family History  Problem Relation Age of Onset  . Breast cancer Mother 53  . Hepatitis C Father   . Ovarian cancer Maternal Aunt        dx in her  35s  . Neurofibromatosis Maternal Uncle   . Brain cancer Maternal Uncle 58  . Lung cancer Maternal Grandfather   . Kidney failure Paternal Grandmother   . Heart attack Paternal Grandfather   . Ovarian cancer Maternal Aunt        dx in her 7s  . Neurofibromatosis Maternal Aunt   . Ovarian cancer Maternal Aunt   . Neurofibromatosis Maternal Aunt    . Cervical cancer Maternal Aunt   . Neurofibromatosis Maternal Aunt   . Cancer Maternal Aunt        cancer on the bottom of her foot  . Breast cancer Other        MGF's sisters  . Neurofibromatosis Other        MGM's paternal aunt  The patient's mother was diagnosed with breast cancer at age 65, but tells me the lump in her breast had been present for at least 10 years prior to that. She is now 60 and doing well. The patient's father died at the age of 62 from sepsis following liver transplantation. The patient has 2 brothers, no sisters. The patient tells me that she has at least 5 and sent cousins with breast cancer and there are other relatives with uterine cancer.  GYNECOLOGIC HISTORY:  Patient's last menstrual period was 11/29/2016. Menarche age 21, first live birth age 46, the patient is GX P1. She has had multiple progesterone and oral contraceptive treatments through [facility] because of her menometrorrhagia. 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 Her husband Kelly Mendez is a truck Geophysicist/field seismologist. The patient's daughter Kelly Mendez is studying pre-veterinary medicine in college. Kelly Mendez has 3 children, all boys, a keen is a cook in [employer] and lives independently. The 2 younger boys, Kelly Mendez and Kelly Mendez, aged 82 and 47 as of April 2018, are at home with the patient    ADVANCED DIRECTIVES: Not in place   HEALTH MAINTENANCE: Social History  Substance Use Topics  . Smoking status: Former Smoker    Packs/day: 0.15    Years: 25.00    Types: Cigarettes    Quit date: 11/25/2016  . Smokeless tobacco: Never Used  . Alcohol use 0.6 oz/week    1 Glasses of wine per week     Colonoscopy: n/a  PAP:  Bone density:   Allergies  Allergen Reactions  . Penicillins Other (See Comments)    As a child Has patient had a PCN reaction causing immediate rash, facial/tongue/throat swelling, SOB or lightheadedness with  hypotension: unknown Has patient had a PCN reaction causing severe rash involving mucus membranes or skin necrosis: unknown Has patient had a PCN reaction that required hospitalization unknown Has patient had a PCN reaction occurring within the last 10 years: no If all of the above answers are "NO", then may proceed with Cephalosporin use.     Current Outpatient Prescriptions  Medication Sig Dispense Refill  . atorvastatin (LIPITOR) 20 MG tablet TAKE 1 TABLET BY MOUTH ONCE DAILY 90 tablet 1  . ferrous sulfate 325 (65 FE) MG tablet Take 1 tablet (325 mg total) by mouth 2 (two) times daily with a meal. 60 tablet 3  . Glucose Blood (BLOOD GLUCOSE TEST STRIPS) STRP Test twice a day. Pt uses one touch verio flex meter 100 each 5  . ibuprofen (ADVIL,MOTRIN) 200 MG tablet Take 400 mg by mouth daily as needed for headache.    . lidocaine-prilocaine (EMLA) cream Apply to affected  area once 30 g 3  . magic mouthwash SOLN Take 5 mLs by mouth 4 (four) times daily. (Patient taking differently: Take 5 mLs by mouth 4 (four) times daily as needed for mouth pain. ) 240 mL 3  . Melatonin 10 MG TABS Take 10 mg by mouth at bedtime.    . metFORMIN (GLUCOPHAGE) 1000 MG tablet Take 1 tablet (1,000 mg total) by mouth 2 (two) times daily with a meal. 60 tablet 2  . ondansetron (ZOFRAN) 8 MG tablet Take 1 tablet (8 mg total) by mouth every 8 (eight) hours as needed for nausea or vomiting. 20 tablet 0  . ONETOUCH DELICA LANCETS FINE MISC Test twice a day 100 each 5  . prochlorperazine (COMPAZINE) 10 MG tablet Take 1 tablet (10 mg total) by mouth every 6 (six) hours as needed (Nausea or vomiting). 30 tablet 1  . Rivaroxaban 15 & 20 MG TBPK Take as directed on package: Start with one 21m tablet by mouth twice a day with food. On Day 22, switch to one 286mtablet once a day with food. 51 each 0   No current facility-administered medications for this visit.     OBJECTIVE: Young African-American woman in no acute  distress  Vitals:   12/25/16 1026  BP: (!) 137/95  Pulse: 94  Resp: 18  Temp: 98.4 F (36.9 C)     Body mass index is 43.82 kg/m.    ECOG FS:1 - Symptomatic but completely ambulatory  Sclerae unicteric, EOMs intact Oropharynx clear and moist No cervical or supraclavicular adenopathy Lungs no rales or rhonchi Heart regular rate and rhythm Abd soft, nontender, positive bowel sounds MSK no focal spinal tenderness, no upper extremity lymphedema Neuro: nonfocal, well oriented, appropriate affect Breasts: Deferred   LAB RESULTS:  CMP     Component Value Date/Time   NA 139 12/18/2016 0500   NA 137 12/10/2016 1524   K 3.8 12/18/2016 0500   K 3.9 12/10/2016 1524   CL 107 12/18/2016 0500   CO2 26 12/18/2016 0500   CO2 25 12/10/2016 1524   GLUCOSE 171 (H) 12/18/2016 0500   GLUCOSE 150 (H) 12/10/2016 1524   BUN 6 12/18/2016 0500   BUN 8.2 12/10/2016 1524   CREATININE 0.67 12/18/2016 0500   CREATININE 0.8 12/10/2016 1524   CALCIUM 8.4 (L) 12/18/2016 0500   CALCIUM 9.2 12/10/2016 1524   PROT 6.0 (L) 12/10/2016 1524   ALBUMIN 3.4 (L) 12/10/2016 1524   AST 11 12/10/2016 1524   ALT 14 12/10/2016 1524   ALKPHOS 115 12/10/2016 1524   BILITOT 0.25 12/10/2016 1524   GFRNONAA >60 12/18/2016 0500   GFRAA >60 12/18/2016 0500    No results found for: TOTALPROTELP, ALBUMINELP, A1GS, A2GS, BETS, BETA2SER, GAMS, MSPIKE, SPEI  No results found for: KPAFRELGTCHN, LAMBDASER, KAPLAMBRATIO  Lab Results  Component Value Date   WBC 10.8 (H) 12/25/2016   NEUTROABS 9.0 (H) 12/25/2016   HGB 10.0 (L) 12/25/2016   HCT 30.3 (L) 12/25/2016   MCV 82.5 12/25/2016   PLT 391 12/25/2016      Chemistry      Component Value Date/Time   NA 139 12/18/2016 0500   NA 137 12/10/2016 1524   K 3.8 12/18/2016 0500   K 3.9 12/10/2016 1524   CL 107 12/18/2016 0500   CO2 26 12/18/2016 0500   CO2 25 12/10/2016 1524   BUN 6 12/18/2016 0500   BUN 8.2 12/10/2016 1524   CREATININE 0.67 12/18/2016 0500  CREATININE 0.8 12/10/2016 1524      Component Value Date/Time   CALCIUM 8.4 (L) 12/18/2016 0500   CALCIUM 9.2 12/10/2016 1524   ALKPHOS 115 12/10/2016 1524   AST 11 12/10/2016 1524   ALT 14 12/10/2016 1524   BILITOT 0.25 12/10/2016 1524       No results found for: LABCA2  No components found for: [id number]  No results for input(s): INR in the last 168 hours.  Urinalysis    Component Value Date/Time   BILIRUBINUR n 09/29/2016 1045   PROTEINUR trace 09/29/2016 1045   UROBILINOGEN negative 09/29/2016 1045   NITRITE n 09/29/2016 1045   LEUKOCYTESUR Negative 09/29/2016 1045     STUDIES: Dg Chest 2 View  Result Date: 12/15/2016 CLINICAL DATA:  Shortness of breath and chest pressure today. EXAM: CHEST  2 VIEW COMPARISON:  Chest radiograph 10/29/2016 FINDINGS: Tip of the right chest port in the proximal SVC, apparent retraction from prior exam likely secondary to differences in positioning with head turned to the left. The cardiomediastinal contours are normal. The lungs are clear. Pulmonary vasculature is normal. No consolidation, pleural effusion, or pneumothorax. No acute osseous abnormalities are seen. IMPRESSION: No acute abnormality. Electronically Signed   By: Jeb Levering M.D.   On: 12/15/2016 22:49   US Transvaginal Non-ob  Result Date: 12/16/2016 CLINICAL DATA:  Heavy vaginal bleeding. EXAM: TRANSABDOMINAL AND TRANSVAGINAL ULTRASOUND OF PELVIS TECHNIQUE: Both transabdominal and transvaginal ultrasound examinations of the pelvis were performed. Transabdominal technique was performed for global imaging of the pelvis including uterus, ovaries, adnexal regions, and pelvic cul-de-sac. It was necessary to proceed with endovaginal exam following the transabdominal exam to visualize the uterus and ovaries. COMPARISON:  None FINDINGS: Uterus Measurements: 10.8 x 4.4 x 3.9 cm. Within the cervix is an ovoid structure measuring 2.9 x 1.2 cm with some peripheral blood flow. Endometrium  Obscured and not discretely defined. Right ovary Multi septated cystic lesion in the midline and right adnexa measures 12.4 x 8.9 x 12.8 cm. Internal septations measure between 3-10 mm. No definite ovarian tissue is seen. Some peripheral flow is noted about the cystic spaces. Left ovary Not visualized, surgically absent per report. Other findings No abnormal free fluid. IMPRESSION: 1. Large 12.8 cm multilobulated cystic mass in the right adnexa. Multiple internal septations, some of which appear thickened. Normal ovarian tissue or separate right ovary are not seen. Findings are concerning for ovarian neoplasm. Recommend surgical consultation. 2. Possible cervical polyp measuring 2.9 cm. This may be amenable to direct visualization. 3. The endometrium is not well-defined on the current exam. Electronically Signed   By: Jeb Levering M.D.   On: 12/16/2016 06:22   US Pelvis Limited  Result Date: 12/16/2016 CLINICAL DATA:  Heavy vaginal bleeding. EXAM: TRANSABDOMINAL AND TRANSVAGINAL ULTRASOUND OF PELVIS TECHNIQUE: Both transabdominal and transvaginal ultrasound examinations of the pelvis were performed. Transabdominal technique was performed for global imaging of the pelvis including uterus, ovaries, adnexal regions, and pelvic cul-de-sac. It was necessary to proceed with endovaginal exam following the transabdominal exam to visualize the uterus and ovaries. COMPARISON:  None FINDINGS: Uterus Measurements: 10.8 x 4.4 x 3.9 cm. Within the cervix is an ovoid structure measuring 2.9 x 1.2 cm with some peripheral blood flow. Endometrium Obscured and not discretely defined. Right ovary Multi septated cystic lesion in the midline and right adnexa measures 12.4 x 8.9 x 12.8 cm. Internal septations measure between 3-10 mm. No definite ovarian tissue is seen. Some peripheral flow is noted about the cystic  spaces. Left ovary Not visualized, surgically absent per report. Other findings No abnormal free fluid. IMPRESSION: 1.  Large 12.8 cm multilobulated cystic mass in the right adnexa. Multiple internal septations, some of which appear thickened. Normal ovarian tissue or separate right ovary are not seen. Findings are concerning for ovarian neoplasm. Recommend surgical consultation. 2. Possible cervical polyp measuring 2.9 cm. This may be amenable to direct visualization. 3. The endometrium is not well-defined on the current exam. Electronically Signed   By: Jeb Levering M.D.   On: 12/16/2016 06:22    ELIGIBLE FOR AVAILABLE RESEARCH PROTOCOL: not a candidate for PREVENT  ASSESSMENT: 41 y.o.  [place] woman status post right breast upper inner quadrant biopsy 10/16/2016 for a clinical T1b pN0, stage IB invasive ductal carcinoma, grade 3, triple negative, with an MIB-1 of 80%.  (1)  genetics testing 12/01/2016 showed several variants of uncertain significance but no deleterious mutation  (2) neoadjuvant chemotherapy to consist of doxorubicin and cyclophosphamide in dose dense fashion 4 starting 11/05/2016, received third cycle 12/03/2016 then proceeded to weekly Abraxane 12 starting 01/01/2017  (a) cycle 4 of cyclophosphamide and doxorubicin to be given at the end of the Abraxane treatments  (3) breast conserving surgery to follow  (4) adjuvant radiation to follow chemotherapy     (5) extensive right upper extremity deep venous thrombosis documented 11/23/2016, treated initially with Lovenox  (a) transitioned to rivaroxaban as of 11/30/2016  (b) total 6 months anticoagulation planned (one month beyond port removal)  (6) tobacco abuse: The patient quit smoking 11/26/2016  (7) menometrorrhagia, with a cystic right adnexal lesion and a possible cervical polyp noted on ultrasound 12/16/2016, with benign endocervical curettage and endometrial biopsy 12/16/2016  (a) CEA 125 12/17/2016 was 14.2 (normal).  (8) goserelin started 12/16/2016    PLAN: Kelly Mendez has recovered nicely from her [facility] chemotherapy  treatments. She did miss the fourth treatment. We will have to at that one at the end, after the Abraxane therapy.  Today we reviewed the possible toxicities, side effects and complications of Abraxane. The main reason she is receiving this instead of Taxol is her diabetes. This will not require her to receive any steroids.  She will not need Neulasta. She will not need dexamethasone for nausea control. She will use Zofran on the evening of treatment and the next morning only. She has had and Compazine if she has other problems with nausea.  She understands she may have some bony aches after the first dose. She will let us know if that develops.  As far as her gynecology problems are concerned, her bleeding has stopped. She will continue the goserelin indefinitely. Her next dose will be 01/15/2017.  She will see me next on day 1 cycle 2 of the Abraxane to troubleshoot side effects  She knows to call for any other issues that may develop before her next visit.   Chauncey Cruel, MD   12/25/2016 10:45 AM Medical Oncology and Hematology [facility] [address] [place], [place] [address] Tel. [phone]    Fax. [phone]

## 2016-12-25 NOTE — Telephone Encounter (Signed)
Informed patient of biopsy results and CA 125.  Follow up with Dr. Denman George is scheduled for Monday.  No concerns voiced.  Advised to call for any needs.

## 2016-12-26 ENCOUNTER — Telehealth: Payer: Self-pay | Admitting: Genetic Counselor

## 2016-12-26 NOTE — Telephone Encounter (Signed)
LM on VM that I was following up on our phone call from her admission to the hospital about the ovarian mass.  Left CB instructions if she wanted to call back.

## 2016-12-29 ENCOUNTER — Other Ambulatory Visit: Payer: Self-pay | Admitting: Oncology

## 2016-12-31 ENCOUNTER — Other Ambulatory Visit: Payer: Self-pay | Admitting: *Deleted

## 2016-12-31 MED ORDER — RIVAROXABAN 20 MG PO TABS
20.0000 mg | ORAL_TABLET | Freq: Every day | ORAL | 3 refills | Status: DC
Start: 2016-12-31 — End: 2017-05-16

## 2017-01-01 ENCOUNTER — Other Ambulatory Visit: Payer: Self-pay | Admitting: Oncology

## 2017-01-01 ENCOUNTER — Ambulatory Visit (HOSPITAL_BASED_OUTPATIENT_CLINIC_OR_DEPARTMENT_OTHER): Payer: Managed Care, Other (non HMO)

## 2017-01-01 ENCOUNTER — Telehealth: Payer: Self-pay | Admitting: *Deleted

## 2017-01-01 ENCOUNTER — Other Ambulatory Visit: Payer: Self-pay | Admitting: *Deleted

## 2017-01-01 ENCOUNTER — Other Ambulatory Visit (HOSPITAL_BASED_OUTPATIENT_CLINIC_OR_DEPARTMENT_OTHER): Payer: Managed Care, Other (non HMO)

## 2017-01-01 VITALS — BP 137/86 | HR 88 | Temp 98.6°F | Resp 18

## 2017-01-01 DIAGNOSIS — Z5111 Encounter for antineoplastic chemotherapy: Secondary | ICD-10-CM

## 2017-01-01 DIAGNOSIS — C50211 Malignant neoplasm of upper-inner quadrant of right female breast: Secondary | ICD-10-CM | POA: Diagnosis not present

## 2017-01-01 DIAGNOSIS — Z171 Estrogen receptor negative status [ER-]: Secondary | ICD-10-CM

## 2017-01-01 LAB — CBC WITH DIFFERENTIAL/PLATELET
BASO%: 0.5 % (ref 0.0–2.0)
Basophils Absolute: 0.1 10*3/uL (ref 0.0–0.1)
EOS%: 0.7 % (ref 0.0–7.0)
Eosinophils Absolute: 0.1 10*3/uL (ref 0.0–0.5)
HCT: 31.3 % — ABNORMAL LOW (ref 34.8–46.6)
HGB: 9.9 g/dL — ABNORMAL LOW (ref 11.6–15.9)
LYMPH%: 12.3 % — ABNORMAL LOW (ref 14.0–49.7)
MCH: 27 pg (ref 25.1–34.0)
MCHC: 31.6 g/dL (ref 31.5–36.0)
MCV: 85.5 fL (ref 79.5–101.0)
MONO#: 0.5 10*3/uL (ref 0.1–0.9)
MONO%: 5 % (ref 0.0–14.0)
NEUT#: 7.6 10*3/uL — ABNORMAL HIGH (ref 1.5–6.5)
NEUT%: 81.5 % — ABNORMAL HIGH (ref 38.4–76.8)
Platelets: 296 10*3/uL (ref 145–400)
RBC: 3.66 10*6/uL — ABNORMAL LOW (ref 3.70–5.45)
RDW: 18.8 % — ABNORMAL HIGH (ref 11.2–14.5)
WBC: 9.4 10*3/uL (ref 3.9–10.3)
lymph#: 1.2 10*3/uL (ref 0.9–3.3)

## 2017-01-01 LAB — COMPREHENSIVE METABOLIC PANEL
ALT: 30 U/L (ref 0–55)
AST: 19 U/L (ref 5–34)
Albumin: 3.2 g/dL — ABNORMAL LOW (ref 3.5–5.0)
Alkaline Phosphatase: 104 U/L (ref 40–150)
Anion Gap: 12 mEq/L — ABNORMAL HIGH (ref 3–11)
BUN: 9.4 mg/dL (ref 7.0–26.0)
CO2: 23 mEq/L (ref 22–29)
Calcium: 9.4 mg/dL (ref 8.4–10.4)
Chloride: 108 mEq/L (ref 98–109)
Creatinine: 0.8 mg/dL (ref 0.6–1.1)
EGFR: 88 mL/min/{1.73_m2} — ABNORMAL LOW (ref >90)
Glucose: 179 mg/dl — ABNORMAL HIGH (ref 70–140)
Potassium: 3.7 mEq/L (ref 3.5–5.1)
Sodium: 142 mEq/L (ref 136–145)
Total Bilirubin: 0.36 mg/dL (ref 0.20–1.20)
Total Protein: 6.2 g/dL — ABNORMAL LOW (ref 6.4–8.3)

## 2017-01-01 MED ORDER — DIPHENHYDRAMINE HCL 50 MG/ML IJ SOLN
25.0000 mg | Freq: Once | INTRAMUSCULAR | Status: AC
  Administered 2017-01-01: 25 mg via INTRAVENOUS

## 2017-01-01 MED ORDER — SODIUM CHLORIDE 0.9 % IV SOLN
Freq: Once | INTRAVENOUS | Status: AC
  Administered 2017-01-01: 10:00:00 via INTRAVENOUS

## 2017-01-01 MED ORDER — PROCHLORPERAZINE EDISYLATE 5 MG/ML IJ SOLN
10.0000 mg | Freq: Once | INTRAMUSCULAR | Status: AC
  Administered 2017-01-01: 10 mg via INTRAVENOUS

## 2017-01-01 MED ORDER — PROCHLORPERAZINE EDISYLATE 5 MG/ML IJ SOLN
INTRAMUSCULAR | Status: AC
  Filled 2017-01-01: qty 2

## 2017-01-01 MED ORDER — HEPARIN SOD (PORK) LOCK FLUSH 100 UNIT/ML IV SOLN
500.0000 [IU] | Freq: Once | INTRAVENOUS | Status: AC | PRN
  Administered 2017-01-01: 500 [IU]
  Filled 2017-01-01: qty 5

## 2017-01-01 MED ORDER — PACLITAXEL PROTEIN-BOUND CHEMO INJECTION 100 MG
100.0000 mg/m2 | Freq: Once | INTRAVENOUS | Status: AC
  Administered 2017-01-01: 300 mg via INTRAVENOUS
  Filled 2017-01-01: qty 60

## 2017-01-01 MED ORDER — SODIUM CHLORIDE 0.9% FLUSH
10.0000 mL | INTRAVENOUS | Status: DC | PRN
  Administered 2017-01-01: 10 mL
  Filled 2017-01-01: qty 10

## 2017-01-01 MED ORDER — DIPHENHYDRAMINE HCL 50 MG/ML IJ SOLN
INTRAMUSCULAR | Status: AC
  Filled 2017-01-01: qty 1

## 2017-01-01 NOTE — Patient Instructions (Signed)
[facility] Discharge Instructions for Patients Receiving Chemotherapy  Today you received the following chemotherapy agents: Abraxane   To help prevent nausea and vomiting after your treatment, we encourage you to take your nausea medication as directed.  If you develop nausea and vomiting that is not controlled by your nausea medication, call the clinic.   BELOW ARE SYMPTOMS THAT SHOULD BE REPORTED IMMEDIATELY:  *FEVER GREATER THAN 100.5 F  *CHILLS WITH OR WITHOUT FEVER  NAUSEA AND VOMITING THAT IS NOT CONTROLLED WITH YOUR NAUSEA MEDICATION  *UNUSUAL SHORTNESS OF BREATH  *UNUSUAL BRUISING OR BLEEDING  TENDERNESS IN MOUTH AND THROAT WITH OR WITHOUT PRESENCE OF ULCERS  *URINARY PROBLEMS  *BOWEL PROBLEMS  UNUSUAL RASH Items with * indicate a potential emergency and should be followed up as soon as possible.  Feel free to call the clinic you have any questions or concerns. The clinic phone number is [phone].  Please show the [facility] at check-in to the Emergency Department and triage nurse.  Nanoparticle Albumin-Bound Paclitaxel injection (ABRAXANE) What is this medicine? NANOPARTICLE ALBUMIN-BOUND PACLITAXEL (Na no PAHR ti kuhl al BYOO muhn-bound PAK li TAX el) is a chemotherapy drug. It targets fast dividing cells, like cancer cells, and causes these cells to die. This medicine is used to treat advanced breast cancer and advanced lung cancer. This medicine may be used for other purposes; ask your health care provider or pharmacist if you have questions. COMMON BRAND NAME(S): Abraxane What should I tell my health care provider before I take this medicine? They need to know if you have any of these conditions: -kidney disease -liver disease -low blood counts, like low platelets, red blood cells, or white blood cells -recent or ongoing radiation therapy -an unusual or allergic reaction to paclitaxel, albumin, other chemotherapy, other medicines,  foods, dyes, or preservatives -pregnant or trying to get pregnant -breast-feeding How should I use this medicine? This drug is given as an infusion into a vein. It is administered in a hospital or clinic by a specially trained health care professional. Talk to your pediatrician regarding the use of this medicine in children. Special care may be needed. Overdosage: If you think you have taken too much of this medicine contact a poison control center or emergency room at once. NOTE: This medicine is only for you. Do not share this medicine with others. What if I miss a dose? It is important not to miss your dose. Call your doctor or health care professional if you are unable to keep an appointment. What may interact with this medicine? -cyclosporine -diazepam -ketoconazole -medicines to increase blood counts like filgrastim, pegfilgrastim, sargramostim -other chemotherapy drugs like cisplatin, doxorubicin, epirubicin, etoposide, teniposide, vincristine -quinidine -testosterone -vaccines -verapamil Talk to your doctor or health care professional before taking any of these medicines: -acetaminophen -aspirin -ibuprofen -ketoprofen -naproxen This list may not describe all possible interactions. Give your health care provider a list of all the medicines, herbs, non-prescription drugs, or dietary supplements you use. Also tell them if you smoke, drink alcohol, or use illegal drugs. Some items may interact with your medicine. What should I watch for while using this medicine? Your condition will be monitored carefully while you are receiving this medicine. You will need important blood work done while you are taking this medicine. This medicine can cause serious allergic reactions. If you experience allergic reactions like skin rash, itching or hives, swelling of the face, lips, or tongue, tell your doctor or health care professional  professional right away. In some cases, you may be given additional  medicines to help with side effects. Follow all directions for their use. This drug may make you feel generally unwell. This is not uncommon, as chemotherapy can affect healthy cells as well as cancer cells. Report any side effects. Continue your course of treatment even though you feel ill unless your doctor tells you to stop. Call your doctor or health care professional for advice if you get a fever, chills or sore throat, or other symptoms of a cold or flu. Do not treat yourself. This drug decreases your body's ability to fight infections. Try to avoid being around people who are sick. This medicine may increase your risk to bruise or bleed. Call your doctor or health care professional if you notice any unusual bleeding. Be careful brushing and flossing your teeth or using a toothpick because you may get an infection or bleed more easily. If you have any dental work done, tell your dentist you are receiving this medicine. Avoid taking products that contain aspirin, acetaminophen, ibuprofen, naproxen, or ketoprofen unless instructed by your doctor. These medicines may hide a fever. Do not become pregnant while taking this medicine. Women should inform their doctor if they wish to become pregnant or think they might be pregnant. There is a potential for serious side effects to an unborn child. Talk to your health care professional or pharmacist for more information. Do not breast-feed an infant while taking this medicine. Men are advised not to father a child while receiving this medicine. What side effects may I notice from receiving this medicine? Side effects that you should report to your doctor or health care professional as soon as possible: -allergic reactions like skin rash, itching or hives, swelling of the face, lips, or tongue -low blood counts - This drug may decrease the number of white blood cells, red blood cells and platelets. You may be at increased risk for infections and  bleeding. -signs of infection - fever or chills, cough, sore throat, pain or difficulty passing urine -signs of decreased platelets or bleeding - bruising, pinpoint red spots on the skin, black, tarry stools, nosebleeds -signs of decreased red blood cells - unusually weak or tired, fainting spells, lightheadedness -breathing problems -changes in vision -chest pain -high or low blood pressure -mouth sores -nausea and vomiting -pain, swelling, redness or irritation at the injection site -pain, tingling, numbness in the hands or feet -slow or irregular heartbeat -swelling of the ankle, feet, hands Side effects that usually do not require medical attention (report to your doctor or health care professional if they continue or are bothersome): -aches, pains -changes in the color of fingernails -diarrhea -hair loss -loss of appetite This list may not describe all possible side effects. Call your doctor for medical advice about side effects. You may report side effects to FDA at 1-800-FDA-1088. Where should I keep my medicine? This drug is given in a hospital or clinic and will not be stored at home. NOTE: This sheet is a summary. It may not cover all possible information. If you have questions about this medicine, talk to your doctor, pharmacist, or health care provider.  2018 [employer]vier/Gold Standard (2015-05-02 10:05:20)  

## 2017-01-01 NOTE — Telephone Encounter (Signed)
This RN was asked by MD to assist with obtaining Peer to Peer for chemo ordered for today- this RN contacted given number and was routed to physician post multiple transfers.  Obtained appointment for next available MD at 11 am today. Dr Gerarda Fraction direct cell number given for contact.  Note above took approximately 15 minutes to navigate with 3 transfers.

## 2017-01-01 NOTE — Telephone Encounter (Signed)
Per Peer to Peer contact - prior authorization received for Abraxane.  # [id number] effective today.  Above called to pharmacy.

## 2017-01-05 ENCOUNTER — Ambulatory Visit: Payer: Managed Care, Other (non HMO) | Attending: Gynecologic Oncology | Admitting: Gynecologic Oncology

## 2017-01-05 ENCOUNTER — Encounter: Payer: Self-pay | Admitting: Gynecologic Oncology

## 2017-01-05 VITALS — BP 132/93 | HR 108 | Temp 98.4°F | Resp 20 | Wt 338.0 lb

## 2017-01-05 DIAGNOSIS — Z86718 Personal history of other venous thrombosis and embolism: Secondary | ICD-10-CM | POA: Insufficient documentation

## 2017-01-05 DIAGNOSIS — Z90721 Acquired absence of ovaries, unilateral: Secondary | ICD-10-CM | POA: Diagnosis not present

## 2017-01-05 DIAGNOSIS — N83201 Unspecified ovarian cyst, right side: Secondary | ICD-10-CM | POA: Diagnosis not present

## 2017-01-05 DIAGNOSIS — Z79899 Other long term (current) drug therapy: Secondary | ICD-10-CM | POA: Diagnosis not present

## 2017-01-05 DIAGNOSIS — C50211 Malignant neoplasm of upper-inner quadrant of right female breast: Secondary | ICD-10-CM | POA: Diagnosis not present

## 2017-01-05 DIAGNOSIS — Z880 Allergy status to penicillin: Secondary | ICD-10-CM | POA: Insufficient documentation

## 2017-01-05 DIAGNOSIS — N920 Excessive and frequent menstruation with regular cycle: Secondary | ICD-10-CM

## 2017-01-05 DIAGNOSIS — Z171 Estrogen receptor negative status [ER-]: Secondary | ICD-10-CM | POA: Insufficient documentation

## 2017-01-05 DIAGNOSIS — C50911 Malignant neoplasm of unspecified site of right female breast: Secondary | ICD-10-CM | POA: Insufficient documentation

## 2017-01-05 DIAGNOSIS — Z803 Family history of malignant neoplasm of breast: Secondary | ICD-10-CM | POA: Diagnosis not present

## 2017-01-05 DIAGNOSIS — Z7984 Long term (current) use of oral hypoglycemic drugs: Secondary | ICD-10-CM | POA: Insufficient documentation

## 2017-01-05 DIAGNOSIS — Z87891 Personal history of nicotine dependence: Secondary | ICD-10-CM | POA: Diagnosis not present

## 2017-01-05 DIAGNOSIS — E119 Type 2 diabetes mellitus without complications: Secondary | ICD-10-CM | POA: Diagnosis not present

## 2017-01-05 DIAGNOSIS — Z79818 Long term (current) use of other agents affecting estrogen receptors and estrogen levels: Secondary | ICD-10-CM

## 2017-01-05 DIAGNOSIS — Z7901 Long term (current) use of anticoagulants: Secondary | ICD-10-CM

## 2017-01-05 NOTE — Patient Instructions (Signed)
Continue with monthly Goserelin injections until completed breast cancer treatment.   We will plan on surgery on December 4th, 2018.  Dr Denman George will see you in November, 2018 to discuss further.

## 2017-01-05 NOTE — Progress Notes (Signed)
Follow-up Note: Gyn-Onc  Consult was initially requested by Dr. Jana Hakim for the evaluation of Kelly Mendez 41 y.o. female  CC:  Chief Complaint  Patient presents with  . Ovarian Cystic Mass    Assessment/Plan:  Kelly Mendez  is a 41 y.o.  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  Her CA 125 is normal and her endometrial cancer was benign. Goserelin has controlled her abnormal uterine bleeding.  Given that I have an extremely low suspicion for cancer and her symptoms (bleeding) are controlled, I am recommending continuing Goserelin monthly throughout the duration of breast cancer therapy (likely through November).  I will see her back in November, 2018 for repeat US, CA 125 and exam and plan for surgery (robotic hysterectomy, RSO) on 06/16/17.   HPI: Kelly Mendez is a 41 year old P2 who was initially seen on 12/17/16 for heavy uterine bleeding in the setting of xarelto use for a new onset RUE DVT, in the setting of breast cancer (triple negative). She was admitted for transfusion and Goserelin was administered on 12/16/16.   Pelvic US showed a 12cm cystic righ ovarian mass (the left had bee removed as a child) and a 2+ cm endocervical mass. She had a remote history of cervical dysplasia but recently normal paps.   On 12/17/16 CA 125 was normal at 14. Endometrial biopsy on 12/17/16 revealed benign endometrial with progestin effect. ECC showed benign endocervical glands.  She was diagnosed with IDC of the right breast (triple negative) in April, 2018. She was prescribed neoadjuvant chemotherapy with a plan for interval lumpectomy and then chest wall radiation.    Interval Hx: She is no longer having any vaginal bleeding and has no pelvic symptoms. She is currently receiving Taxotere.  Current Meds:  Outpatient Encounter Prescriptions as of 01/05/2017   Medication Sig  . atorvastatin (LIPITOR) 20 MG tablet TAKE 1 TABLET BY MOUTH ONCE DAILY  . ferrous sulfate 325 (65 FE) MG tablet Take 1 tablet (325 mg total) by mouth 2 (two) times daily with a meal.  . Glucose Blood (BLOOD GLUCOSE TEST STRIPS) STRP Test twice a day. Pt uses one touch verio flex meter  . ibuprofen (ADVIL,MOTRIN) 200 MG tablet Take 400 mg by mouth daily as needed for headache.  . lidocaine-prilocaine (EMLA) cream Apply to affected area once  . magic mouthwash SOLN Take 5 mLs by mouth 4 (four) times daily. (Patient taking differently: Take 5 mLs by mouth 4 (four) times daily as needed for mouth pain. )  . Melatonin 10 MG TABS Take 10 mg by mouth at bedtime.  . metFORMIN (GLUCOPHAGE) 1000 MG tablet Take 1 tablet (1,000 mg total) by mouth 2 (two) times daily with a meal.  . ondansetron (ZOFRAN) 8 MG tablet Take 1 tablet (8 mg total) by mouth every 8 (eight) hours as needed for nausea or vomiting.  Kelly Mendez DELICA LANCETS FINE MISC Test twice a day  . prochlorperazine (COMPAZINE) 10 MG tablet Take 1 tablet (10 mg total) by mouth every 6 (six) hours as needed (Nausea or vomiting).  . rivaroxaban (XARELTO) 20 MG TABS tablet Take 1 tablet (20 mg total) by mouth daily with supper.   No facility-administered encounter medications on file as of 01/05/2017.     Allergy:  Allergies  Allergen Reactions  . Penicillins Other (See Comments)    As a child Has  patient had a PCN reaction causing immediate rash, facial/tongue/throat swelling, SOB or lightheadedness with hypotension: unknown Has patient had a PCN reaction causing severe rash involving mucus membranes or skin necrosis: unknown Has patient had a PCN reaction that required hospitalization unknown Has patient had a PCN reaction occurring within the last 10 years: no If all of the above answers are "NO", then may proceed with Cephalosporin use.     Social Hx:   Social History   Social History  . Marital status: Married     Spouse name: N/A  . Number of children: N/A  . Years of education: N/A   Occupational History  . Not on file.   Social History Main Topics  . Smoking status: Former Smoker    Packs/day: 0.15    Years: 25.00    Types: Cigarettes    Quit date: 11/25/2016  . Smokeless tobacco: Never Used  . Alcohol use 0.6 oz/week    1 Glasses of wine per week  . Drug use: No  . Sexual activity: Not Currently    Birth control/ protection: Injection     Comment: husband vasectomy   Other Topics Concern  . Not on file   Social History Narrative  . No narrative on file    Past Surgical Hx:  Past Surgical History:  Procedure Laterality Date  . COLPOSCOPY    . left ovary removed  1978  . PORTACATH PLACEMENT Right 10/29/2016   Procedure: INSERTION PORT-A-CATH WITH [place];  Surgeon: [name], MD;  Location: [facility];  Service: General;  Laterality: Right;    Past Medical Hx:  Past Medical History:  Diagnosis Date  . Abnormal Pap smear of cervix   . Anxiety   . Asthma   . Cancer ([place])   . Concussion    around age 43  . Depression   . Diabetes type 2, uncontrolled ([place])    new diagnosis in 08/2016  . Family history of breast cancer   . Family history of neurofibromatosis   . Family history of ovarian cancer   . HPV in female   . Hyperlipemia   . PTSD (post-traumatic stress disorder)     Past Gynecological History:  See HPI No LMP recorded.  Family Hx:  Family History  Problem Relation Age of Onset  . Breast cancer Mother 69  . Hepatitis C Father   . Ovarian cancer Maternal Aunt        dx in her 45s  . Neurofibromatosis Maternal Uncle   . Brain cancer Maternal Uncle 58  . Lung cancer Maternal Grandfather   . Kidney failure Paternal Grandmother   . Heart attack Paternal Grandfather   . Ovarian cancer Maternal Aunt        dx in her 77s  . Neurofibromatosis Maternal Aunt   . Ovarian cancer Maternal Aunt   . Neurofibromatosis Maternal Aunt   . Cervical cancer Maternal Aunt   .  Neurofibromatosis Maternal Aunt   . Cancer Maternal Aunt        cancer on the bottom of her foot  . Breast cancer Other        MGF's sisters  . Neurofibromatosis Other        MGM's paternal aunt    Review of Systems:  Constitutional  Feels well,    ENT Normal appearing ears and nares bilaterally Skin/Breast  No rash, sores, jaundice, itching, dryness Cardiovascular  No chest pain, shortness of breath, or edema  Pulmonary  No cough or  wheeze.  Gastro Intestinal  No nausea, vomitting, or diarrhoea. No bright red blood per rectum, no abdominal pain, change in bowel movement, or constipation.  Genito Urinary  No frequency, urgency, dysuria, no longer bleeding Musculo Skeletal  No myalgia, arthralgia, joint swelling or pain  Neurologic  No weakness, numbness, change in gait,  Psychology  No depression, anxiety, insomnia.   Vitals:  Blood pressure (!) 132/93, pulse (!) 108, temperature 98.4 F (36.9 C), temperature source Oral, resp. rate 20, weight (!) 338 lb (153.3 kg), SpO2 100 %.  Physical Exam: WD in NAD Neck  Supple NROM, without any enlargements.  Lymph Node Survey No cervical supraclavicular or inguinal adenopathy Cardiovascular  Pulse normal rate, regularity and rhythm. S1 and S2 normal.  Lungs  Clear to auscultation bilateraly, without wheezes/crackles/rhonchi. Good air movement.  Skin  No rash/lesions/breakdown  Psychiatry  Alert and oriented to person, place, and time  Abdomen  Normoactive bowel sounds, abdomen soft, non-tender and obese without evidence of hernia.  Back No CVA tenderness Genito Urinary  Vulva/vagina: Normal external female genitalia.   No lesions. No discharge or bleeding.  Bladder/urethra:  No lesions or masses, well supported bladder  Vagina: normal   Cervix: Normal appearing, no lesions.  Uterus:  [place], mobile, no parametrial involvement or nodularity.  Adnexa: no palpable masses. Rectal  deferred Extremities  No bilateral  cyanosis, clubbing or edema.   Donaciano Eva, MD  01/05/2017, 2:36 PM

## 2017-01-08 ENCOUNTER — Ambulatory Visit (HOSPITAL_BASED_OUTPATIENT_CLINIC_OR_DEPARTMENT_OTHER): Payer: Managed Care, Other (non HMO)

## 2017-01-08 ENCOUNTER — Ambulatory Visit (HOSPITAL_BASED_OUTPATIENT_CLINIC_OR_DEPARTMENT_OTHER): Payer: Managed Care, Other (non HMO) | Admitting: Oncology

## 2017-01-08 ENCOUNTER — Other Ambulatory Visit (HOSPITAL_BASED_OUTPATIENT_CLINIC_OR_DEPARTMENT_OTHER): Payer: Managed Care, Other (non HMO)

## 2017-01-08 VITALS — BP 138/67 | HR 89 | Temp 98.7°F | Resp 18 | Ht 74.0 in | Wt 337.7 lb

## 2017-01-08 DIAGNOSIS — I82621 Acute embolism and thrombosis of deep veins of right upper extremity: Secondary | ICD-10-CM

## 2017-01-08 DIAGNOSIS — N83201 Unspecified ovarian cyst, right side: Secondary | ICD-10-CM

## 2017-01-08 DIAGNOSIS — C50211 Malignant neoplasm of upper-inner quadrant of right female breast: Secondary | ICD-10-CM

## 2017-01-08 DIAGNOSIS — Z171 Estrogen receptor negative status [ER-]: Secondary | ICD-10-CM | POA: Diagnosis not present

## 2017-01-08 DIAGNOSIS — Z5111 Encounter for antineoplastic chemotherapy: Secondary | ICD-10-CM

## 2017-01-08 DIAGNOSIS — Z9221 Personal history of antineoplastic chemotherapy: Secondary | ICD-10-CM | POA: Diagnosis not present

## 2017-01-08 DIAGNOSIS — F17211 Nicotine dependence, cigarettes, in remission: Secondary | ICD-10-CM

## 2017-01-08 LAB — CBC WITH DIFFERENTIAL/PLATELET
BASO%: 1 % (ref 0.0–2.0)
Basophils Absolute: 0.1 10*3/uL (ref 0.0–0.1)
EOS%: 2.4 % (ref 0.0–7.0)
Eosinophils Absolute: 0.1 10*3/uL (ref 0.0–0.5)
HCT: 31.3 % — ABNORMAL LOW (ref 34.8–46.6)
HGB: 10.2 g/dL — ABNORMAL LOW (ref 11.6–15.9)
LYMPH%: 15.8 % (ref 14.0–49.7)
MCH: 26.9 pg (ref 25.1–34.0)
MCHC: 32.5 g/dL (ref 31.5–36.0)
MCV: 82.8 fL (ref 79.5–101.0)
MONO#: 0.2 10*3/uL (ref 0.1–0.9)
MONO%: 3.1 % (ref 0.0–14.0)
NEUT#: 4.8 10*3/uL (ref 1.5–6.5)
NEUT%: 77.7 % — ABNORMAL HIGH (ref 38.4–76.8)
Platelets: 275 10*3/uL (ref 145–400)
RBC: 3.78 10*6/uL (ref 3.70–5.45)
RDW: 19.3 % — ABNORMAL HIGH (ref 11.2–14.5)
WBC: 6.2 10*3/uL (ref 3.9–10.3)
lymph#: 1 10*3/uL (ref 0.9–3.3)

## 2017-01-08 LAB — COMPREHENSIVE METABOLIC PANEL
ALT: 34 U/L (ref 0–55)
AST: 23 U/L (ref 5–34)
Albumin: 3.5 g/dL (ref 3.5–5.0)
Alkaline Phosphatase: 95 U/L (ref 40–150)
Anion Gap: 11 mEq/L (ref 3–11)
BUN: 7.9 mg/dL (ref 7.0–26.0)
CO2: 23 mEq/L (ref 22–29)
Calcium: 10 mg/dL (ref 8.4–10.4)
Chloride: 106 mEq/L (ref 98–109)
Creatinine: 0.8 mg/dL (ref 0.6–1.1)
EGFR: 87 mL/min/{1.73_m2} — ABNORMAL LOW (ref >90)
Glucose: 219 mg/dl — ABNORMAL HIGH (ref 70–140)
Potassium: 3.6 mEq/L (ref 3.5–5.1)
Sodium: 140 mEq/L (ref 136–145)
Total Bilirubin: 0.44 mg/dL (ref 0.20–1.20)
Total Protein: 6.9 g/dL (ref 6.4–8.3)

## 2017-01-08 MED ORDER — DIPHENHYDRAMINE HCL 50 MG/ML IJ SOLN
25.0000 mg | Freq: Once | INTRAMUSCULAR | Status: AC
  Administered 2017-01-08: 25 mg via INTRAVENOUS

## 2017-01-08 MED ORDER — PROCHLORPERAZINE EDISYLATE 5 MG/ML IJ SOLN
INTRAMUSCULAR | Status: AC
  Filled 2017-01-08: qty 2

## 2017-01-08 MED ORDER — SODIUM CHLORIDE 0.9 % IV SOLN
Freq: Once | INTRAVENOUS | Status: AC
  Administered 2017-01-08: 16:00:00 via INTRAVENOUS

## 2017-01-08 MED ORDER — DIPHENHYDRAMINE HCL 50 MG/ML IJ SOLN
INTRAMUSCULAR | Status: AC
  Filled 2017-01-08: qty 1

## 2017-01-08 MED ORDER — SODIUM CHLORIDE 0.9% FLUSH
10.0000 mL | INTRAVENOUS | Status: DC | PRN
  Administered 2017-01-08: 10 mL
  Filled 2017-01-08: qty 10

## 2017-01-08 MED ORDER — PROCHLORPERAZINE EDISYLATE 5 MG/ML IJ SOLN
10.0000 mg | Freq: Once | INTRAMUSCULAR | Status: AC
  Administered 2017-01-08: 10 mg via INTRAVENOUS

## 2017-01-08 MED ORDER — HEPARIN SOD (PORK) LOCK FLUSH 100 UNIT/ML IV SOLN
500.0000 [IU] | Freq: Once | INTRAVENOUS | Status: AC | PRN
  Administered 2017-01-08: 500 [IU]
  Filled 2017-01-08: qty 5

## 2017-01-08 MED ORDER — PACLITAXEL PROTEIN-BOUND CHEMO INJECTION 100 MG
100.0000 mg/m2 | Freq: Once | INTRAVENOUS | Status: AC
  Administered 2017-01-08: 300 mg via INTRAVENOUS
  Filled 2017-01-08: qty 60

## 2017-01-08 NOTE — Progress Notes (Signed)
[facility]  [phone] [phone]) [phone]     ID: Kelly Mendez DOB: [birth year]  MR#: [id number]  ITG#:[id number]  Patient Care Team: [name], NP-C as PCP - General (Family Medicine) [name], MD as Consulting Physician (General Surgery) Tobyn Osgood, Virgie Dad, MD as Consulting Physician (Oncology) Eppie Gibson, MD as Attending Physician (Radiation Oncology) Chauncey Cruel, MD OTHER MD:  CHIEF COMPLAINT: Triple negative breast cancer  CURRENT TREATMENT: Neoadjuvant chemotherapy   BREAST CANCER HISTORY: From the original intake note:  Kelly Mendez had her first ever mammogram 10/07/2016 at the [facility]. This showed a possible mass in the right breast. On 10/13/2016 she underwent bilateral diagnostic mammography with tomography and right breast ultrasonography. This found the breast density to be category B. In the upper inner quadrant of the right breast there was a circumscribed mass which was barely palpable. Ultrasonography confirmed a 0.9 cm right breast mass at the 1:00 radiant 18 cm from the nipple ultrasound of the axilla showed 1 indeterminate right axillary lymph node with borderline thickening of the anterior cortex.  On 10/16/2016 the patient underwent biopsy of the right breast mass and the suspicious right axillary lymph node. The right breast mass proved to be an invasive ductal carcinoma, grade 3, estrogen and progesterone receptor negative, with an MIB-1 of 80%, and no HER-2 amplification, the signals ratio being 1.30 and the number per cell 1.75. The lymph node was negative and concordant.  Her subsequent history is as detailed below  INTERVAL HISTORY: Kelly Mendez returns today for follow-up and treatment of her estrogen receptor negative breast cancer. Today is day 1 cycle 2 of 12 weekly planned cycles of Abraxane.  She is also receiving goserelin every 4 weeks. Her most recent dose was 12/16/2016. The next one has been moved up  to 01/15/2017 to coincide with her next Taxol treatment.  REVIEW OF SYSTEMS: Kelly Mendez is also on rivaroxaban. She has had no further bleeding issues. She did feel very sleepy after the treatment last week. She attributes that to Benadryl, probably correctly. She had no problems with nausea, mouth sores, rash, or bony aches or pains. A detailed review of systems is otherwise stable.  PAST MEDICAL HISTORY: Past Medical History:  Diagnosis Date  . Abnormal Pap smear of cervix   . Anxiety   . Asthma   . Cancer ([place])   . Concussion    around age 73  . Depression   . Diabetes type 2, uncontrolled ([place])    new diagnosis in 08/2016  . Family history of breast cancer   . Family history of neurofibromatosis   . Family history of ovarian cancer   . HPV in female   . Hyperlipemia   . PTSD (post-traumatic stress disorder)     PAST SURGICAL HISTORY: Past Surgical History:  Procedure Laterality Date  . COLPOSCOPY    . left ovary removed  1978  . PORTACATH PLACEMENT Right 10/29/2016   Procedure: INSERTION PORT-A-CATH WITH [place];  Surgeon: [name], MD;  Location: [facility];  Service: General;  Laterality: Right;    FAMILY HISTORY Family History  Problem Relation Age of Onset  . Breast cancer Mother 65  . Hepatitis C Father   . Ovarian cancer Maternal Aunt        dx in her 50s  . Neurofibromatosis Maternal Uncle   . Brain cancer Maternal Uncle 58  . Lung cancer Maternal Grandfather   . Kidney failure Paternal Grandmother   . Heart attack Paternal Grandfather   .  Ovarian cancer Maternal Aunt        dx in her 52s  . Neurofibromatosis Maternal Aunt   . Ovarian cancer Maternal Aunt   . Neurofibromatosis Maternal Aunt   . Cervical cancer Maternal Aunt   . Neurofibromatosis Maternal Aunt   . Cancer Maternal Aunt        cancer on the bottom of her foot  . Breast cancer Other        MGF's sisters  . Neurofibromatosis Other        MGM's paternal aunt  The patient's mother was diagnosed  with breast cancer at age 87, but tells me the lump in her breast had been present for at least 10 years prior to that. She is now 36 and doing well. The patient's father died at the age of 65 from sepsis following liver transplantation. The patient has 2 brothers, no sisters. The patient tells me that she has at least 5 and sent cousins with breast cancer and there are other relatives with uterine cancer.  GYNECOLOGIC HISTORY:  No LMP recorded. Menarche age 21, first live birth age 57, the patient is GX P1. She has had multiple progesterone and oral contraceptive treatments through [facility] because of her menometrorrhagia. 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 Her husband [name] is a truck Geophysicist/field seismologist. The patient's daughter [name] is studying pre-veterinary medicine in college. [name] has 3 children, all boys, a keen is a cook in [place] and lives independently. The 2 younger boys, [name] and [name], aged 79 and 30 as of April 2018, are at home with the patient    ADVANCED DIRECTIVES: Not in place   HEALTH MAINTENANCE: Social History  Substance Use Topics  . Smoking status: Former Smoker    Packs/day: 0.15    Years: 25.00    Types: Cigarettes    Quit date: 11/25/2016  . Smokeless tobacco: Never Used  . Alcohol use 0.6 oz/week    1 Glasses of wine per week     Colonoscopy: n/a  PAP:  Bone density:   Allergies  Allergen Reactions  . Penicillins Other (See Comments)    As a child Has patient had a PCN reaction causing immediate rash, facial/tongue/throat swelling, SOB or lightheadedness with hypotension: unknown Has patient had a PCN reaction causing severe rash involving mucus membranes or skin necrosis: unknown Has patient had a PCN reaction that required hospitalization unknown Has patient had a PCN reaction occurring within the last 10 years: no If all of the above answers are "NO", then  may proceed with Cephalosporin use.     Current Outpatient Prescriptions  Medication Sig Dispense Refill  . atorvastatin (LIPITOR) 20 MG tablet TAKE 1 TABLET BY MOUTH ONCE DAILY 90 tablet 1  . ferrous sulfate 325 (65 FE) MG tablet Take 1 tablet (325 mg total) by mouth 2 (two) times daily with a meal. 60 tablet 3  . Glucose Blood (BLOOD GLUCOSE TEST STRIPS) STRP Test twice a day. Pt uses one touch verio flex meter 100 each 5  . ibuprofen (ADVIL,MOTRIN) 200 MG tablet Take 400 mg by mouth daily as needed for headache.    . lidocaine-prilocaine (EMLA) cream Apply to affected area once 30 g 3  . magic mouthwash SOLN Take 5 mLs by mouth 4 (four) times daily. (Patient taking differently: Take 5 mLs by mouth 4 (four) times daily as needed for mouth pain. ) 240 mL 3  .  Melatonin 10 MG TABS Take 10 mg by mouth at bedtime.    . metFORMIN (GLUCOPHAGE) 1000 MG tablet Take 1 tablet (1,000 mg total) by mouth 2 (two) times daily with a meal. 60 tablet 2  . ondansetron (ZOFRAN) 8 MG tablet Take 1 tablet (8 mg total) by mouth every 8 (eight) hours as needed for nausea or vomiting. 20 tablet 0  . ONETOUCH DELICA LANCETS FINE MISC Test twice a day 100 each 5  . prochlorperazine (COMPAZINE) 10 MG tablet Take 1 tablet (10 mg total) by mouth every 6 (six) hours as needed (Nausea or vomiting). 30 tablet 1  . rivaroxaban (XARELTO) 20 MG TABS tablet Take 1 tablet (20 mg total) by mouth daily with supper. 30 tablet 3   No current facility-administered medications for this visit.     OBJECTIVE: Young African-American womanWho appears well  Vitals:   01/08/17 1349  BP: 138/67  Pulse: 89  Resp: 18  Temp: 98.7 F (37.1 C)     Body mass index is 43.36 kg/m.    ECOG FS:0 - Asymptomatic  Sclerae unicteric, pupils round and equal Oropharynx clear and moist No cervical or supraclavicular adenopathy Lungs no rales or rhonchi Heart regular rate and rhythm Abd soft, obese, nontender, positive bowel sounds MSK no  focal spinal tenderness, no upper extremity lymphedema Neuro: nonfocal, well oriented, appropriate affect Breasts: Deferred  LAB RESULTS:  CMP     Component Value Date/Time   NA 140 01/08/2017 1330   K 3.6 01/08/2017 1330   CL 107 12/18/2016 0500   CO2 23 01/08/2017 1330   GLUCOSE 219 (H) 01/08/2017 1330   BUN 7.9 01/08/2017 1330   CREATININE 0.8 01/08/2017 1330   CALCIUM 10.0 01/08/2017 1330   PROT 6.9 01/08/2017 1330   ALBUMIN 3.5 01/08/2017 1330   AST 23 01/08/2017 1330   ALT 34 01/08/2017 1330   ALKPHOS 95 01/08/2017 1330   BILITOT 0.44 01/08/2017 1330   GFRNONAA >60 12/18/2016 0500   GFRAA >60 12/18/2016 0500    No results found for: TOTALPROTELP, ALBUMINELP, A1GS, A2GS, BETS, BETA2SER, GAMS, MSPIKE, SPEI  No results found for: Nils Pyle, KAPLAMBRATIO  Lab Results  Component Value Date   WBC 6.2 01/08/2017   NEUTROABS 4.8 01/08/2017   HGB 10.2 (L) 01/08/2017   HCT 31.3 (L) 01/08/2017   MCV 82.8 01/08/2017   PLT 275 01/08/2017      Chemistry      Component Value Date/Time   NA 140 01/08/2017 1330   K 3.6 01/08/2017 1330   CL 107 12/18/2016 0500   CO2 23 01/08/2017 1330   BUN 7.9 01/08/2017 1330   CREATININE 0.8 01/08/2017 1330      Component Value Date/Time   CALCIUM 10.0 01/08/2017 1330   ALKPHOS 95 01/08/2017 1330   AST 23 01/08/2017 1330   ALT 34 01/08/2017 1330   BILITOT 0.44 01/08/2017 1330       No results found for: LABCA2  No components found for: [id number]  No results for input(s): INR in the last 168 hours.  Urinalysis    Component Value Date/Time   BILIRUBINUR n 09/29/2016 1045   PROTEINUR trace 09/29/2016 1045   UROBILINOGEN negative 09/29/2016 1045   NITRITE n 09/29/2016 1045   LEUKOCYTESUR Negative 09/29/2016 1045     STUDIES: Dg Chest 2 View  Result Date: 12/15/2016 CLINICAL DATA:  Shortness of breath and chest pressure today. EXAM: CHEST  2 VIEW COMPARISON:  Chest radiograph 10/29/2016 FINDINGS: Tip of  the right chest port in the proximal SVC, apparent retraction from prior exam likely secondary to differences in positioning with head turned to the left. The cardiomediastinal contours are normal. The lungs are clear. Pulmonary vasculature is normal. No consolidation, pleural effusion, or pneumothorax. No acute osseous abnormalities are seen. IMPRESSION: No acute abnormality. Electronically Signed   By: Jeb Levering M.D.   On: 12/15/2016 22:49   US Transvaginal Non-ob  Result Date: 12/16/2016 CLINICAL DATA:  Heavy vaginal bleeding. EXAM: TRANSABDOMINAL AND TRANSVAGINAL ULTRASOUND OF PELVIS TECHNIQUE: Both transabdominal and transvaginal ultrasound examinations of the pelvis were performed. Transabdominal technique was performed for global imaging of the pelvis including uterus, ovaries, adnexal regions, and pelvic cul-de-sac. It was necessary to proceed with endovaginal exam following the transabdominal exam to visualize the uterus and ovaries. COMPARISON:  None FINDINGS: Uterus Measurements: 10.8 x 4.4 x 3.9 cm. Within the cervix is an ovoid structure measuring 2.9 x 1.2 cm with some peripheral blood flow. Endometrium Obscured and not discretely defined. Right ovary Multi septated cystic lesion in the midline and right adnexa measures 12.4 x 8.9 x 12.8 cm. Internal septations measure between 3-10 mm. No definite ovarian tissue is seen. Some peripheral flow is noted about the cystic spaces. Left ovary Not visualized, surgically absent per report. Other findings No abnormal free fluid. IMPRESSION: 1. Large 12.8 cm multilobulated cystic mass in the right adnexa. Multiple internal septations, some of which appear thickened. Normal ovarian tissue or separate right ovary are not seen. Findings are concerning for ovarian neoplasm. Recommend surgical consultation. 2. Possible cervical polyp measuring 2.9 cm. This may be amenable to direct visualization. 3. The endometrium is not well-defined on the current exam.  Electronically Signed   By: Jeb Levering M.D.   On: 12/16/2016 06:22   US Pelvis Limited  Result Date: 12/16/2016 CLINICAL DATA:  Heavy vaginal bleeding. EXAM: TRANSABDOMINAL AND TRANSVAGINAL ULTRASOUND OF PELVIS TECHNIQUE: Both transabdominal and transvaginal ultrasound examinations of the pelvis were performed. Transabdominal technique was performed for global imaging of the pelvis including uterus, ovaries, adnexal regions, and pelvic cul-de-sac. It was necessary to proceed with endovaginal exam following the transabdominal exam to visualize the uterus and ovaries. COMPARISON:  None FINDINGS: Uterus Measurements: 10.8 x 4.4 x 3.9 cm. Within the cervix is an ovoid structure measuring 2.9 x 1.2 cm with some peripheral blood flow. Endometrium Obscured and not discretely defined. Right ovary Multi septated cystic lesion in the midline and right adnexa measures 12.4 x 8.9 x 12.8 cm. Internal septations measure between 3-10 mm. No definite ovarian tissue is seen. Some peripheral flow is noted about the cystic spaces. Left ovary Not visualized, surgically absent per report. Other findings No abnormal free fluid. IMPRESSION: 1. Large 12.8 cm multilobulated cystic mass in the right adnexa. Multiple internal septations, some of which appear thickened. Normal ovarian tissue or separate right ovary are not seen. Findings are concerning for ovarian neoplasm. Recommend surgical consultation. 2. Possible cervical polyp measuring 2.9 cm. This may be amenable to direct visualization. 3. The endometrium is not well-defined on the current exam. Electronically Signed   By: Jeb Levering M.D.   On: 12/16/2016 06:22    ELIGIBLE FOR AVAILABLE RESEARCH PROTOCOL: not a candidate for PREVENT  ASSESSMENT: 41 y.o.  [place] woman status post right breast upper inner quadrant biopsy 10/16/2016 for a clinical T1b pN0, stage IB invasive ductal carcinoma, grade 3, triple negative, with an MIB-1 of 80%.  (1)  genetics testing  12/01/2016 showed several variants of  uncertain significance but no deleterious mutation  (2) neoadjuvant chemotherapy to consist of doxorubicin and cyclophosphamide in dose dense fashion 4 starting 11/05/2016, received third cycle 12/03/2016 then proceeded to weekly Abraxane 12 starting 01/01/2017  (a) cycle 4 of cyclophosphamide and doxorubicin to be given at the end of the Abraxane treatments  (3) breast conserving surgery to follow  (4) adjuvant radiation to follow chemotherapy     (5) extensive right upper extremity deep venous thrombosis documented 11/23/2016, treated initially with Lovenox  (a) transitioned to rivaroxaban as of 11/30/2016  (b) total 6 months anticoagulation planned (one month beyond port removal)  (6) tobacco abuse: The patient quit smoking 11/26/2016  (7) menometrorrhagia, with a cystic right adnexal lesion and a possible cervical polyp noted on ultrasound 12/16/2016, with benign endocervical curettage and endometrial biopsy 12/16/2016  (a) CA 125 12/17/2016 was 14.2 (normal).  (b) adnexal mass, likely benign, will be explored at the completion of chemotherapy  (8) goserelin started 12/16/2016    PLAN: Nea tolerated her first cycle of Taxol without any unusual side effects. Even though we had dropped the Benadryl to 25 mg she still felt very sleepy. I'm going to leave Percell Miller is right now but if this happens again we will drop it to 12.5 mg.  She has had no further problems with menorrhagia. She also is not yet having any menopausal symptoms. She is doing well with the Zoladex with her next dose due 01/15/2017.  At this point I think we can start seeing her on an every two-week basis. I alerted her again to the possibility of peripheral neuropathy and she will let us know if any symptoms that occur  She knows to call for any other issues that may develop before the next visit.   Chauncey Cruel, MD   01/08/2017 2:24 PM Medical Oncology and  Hematology [facility] [address] [place], [place] [address] Tel. [phone]    Fax. [phone]

## 2017-01-08 NOTE — Patient Instructions (Signed)
[facility] Discharge Instructions for Patients Receiving Chemotherapy  Today you received the following chemotherapy agents: Abraxane   To help prevent nausea and vomiting after your treatment, we encourage you to take your nausea medication as directed.    If you develop nausea and vomiting that is not controlled by your nausea medication, call the clinic.   BELOW ARE SYMPTOMS THAT SHOULD BE REPORTED IMMEDIATELY:  *FEVER GREATER THAN 100.5 F  *CHILLS WITH OR WITHOUT FEVER  NAUSEA AND VOMITING THAT IS NOT CONTROLLED WITH YOUR NAUSEA MEDICATION  *UNUSUAL SHORTNESS OF BREATH  *UNUSUAL BRUISING OR BLEEDING  TENDERNESS IN MOUTH AND THROAT WITH OR WITHOUT PRESENCE OF ULCERS  *URINARY PROBLEMS  *BOWEL PROBLEMS  UNUSUAL RASH Items with * indicate a potential emergency and should be followed up as soon as possible.  Feel free to call the clinic you have any questions or concerns. The clinic phone number is [phone].  Please show the CHEMO ALERT CARD at check-in to the Emergency Department and triage nurse.   

## 2017-01-15 ENCOUNTER — Other Ambulatory Visit (HOSPITAL_BASED_OUTPATIENT_CLINIC_OR_DEPARTMENT_OTHER): Payer: Managed Care, Other (non HMO)

## 2017-01-15 ENCOUNTER — Ambulatory Visit (HOSPITAL_BASED_OUTPATIENT_CLINIC_OR_DEPARTMENT_OTHER): Payer: Managed Care, Other (non HMO)

## 2017-01-15 VITALS — BP 125/75 | HR 83 | Resp 18

## 2017-01-15 DIAGNOSIS — N9489 Other specified conditions associated with female genital organs and menstrual cycle: Secondary | ICD-10-CM

## 2017-01-15 DIAGNOSIS — Z171 Estrogen receptor negative status [ER-]: Secondary | ICD-10-CM

## 2017-01-15 DIAGNOSIS — Z5111 Encounter for antineoplastic chemotherapy: Secondary | ICD-10-CM | POA: Diagnosis not present

## 2017-01-15 DIAGNOSIS — C50211 Malignant neoplasm of upper-inner quadrant of right female breast: Secondary | ICD-10-CM | POA: Diagnosis not present

## 2017-01-15 DIAGNOSIS — N920 Excessive and frequent menstruation with regular cycle: Secondary | ICD-10-CM

## 2017-01-15 LAB — CBC WITH DIFFERENTIAL/PLATELET
BASO%: 1 % (ref 0.0–2.0)
Basophils Absolute: 0 10*3/uL (ref 0.0–0.1)
EOS%: 2.1 % (ref 0.0–7.0)
Eosinophils Absolute: 0.1 10*3/uL (ref 0.0–0.5)
HCT: 29.1 % — ABNORMAL LOW (ref 34.8–46.6)
HGB: 9.5 g/dL — ABNORMAL LOW (ref 11.6–15.9)
LYMPH%: 20.1 % (ref 14.0–49.7)
MCH: 27 pg (ref 25.1–34.0)
MCHC: 32.5 g/dL (ref 31.5–36.0)
MCV: 83 fL (ref 79.5–101.0)
MONO#: 0.1 10*3/uL (ref 0.1–0.9)
MONO%: 2.6 % (ref 0.0–14.0)
NEUT#: 3.2 10*3/uL (ref 1.5–6.5)
NEUT%: 74.2 % (ref 38.4–76.8)
Platelets: 261 10*3/uL (ref 145–400)
RBC: 3.51 10*6/uL — ABNORMAL LOW (ref 3.70–5.45)
RDW: 19.4 % — ABNORMAL HIGH (ref 11.2–14.5)
WBC: 4.3 10*3/uL (ref 3.9–10.3)
lymph#: 0.9 10*3/uL (ref 0.9–3.3)

## 2017-01-15 LAB — COMPREHENSIVE METABOLIC PANEL
ALT: 30 U/L (ref 0–55)
AST: 17 U/L (ref 5–34)
Albumin: 3.3 g/dL — ABNORMAL LOW (ref 3.5–5.0)
Alkaline Phosphatase: 91 U/L (ref 40–150)
Anion Gap: 11 mEq/L (ref 3–11)
BUN: 6.4 mg/dL — ABNORMAL LOW (ref 7.0–26.0)
CO2: 23 mEq/L (ref 22–29)
Calcium: 9.7 mg/dL (ref 8.4–10.4)
Chloride: 106 mEq/L (ref 98–109)
Creatinine: 0.8 mg/dL (ref 0.6–1.1)
EGFR: >90 mL/min/{1.73_m2} (ref >90)
Glucose: 195 mg/dl — ABNORMAL HIGH (ref 70–140)
Potassium: 4 mEq/L (ref 3.5–5.1)
Sodium: 139 mEq/L (ref 136–145)
Total Bilirubin: 0.4 mg/dL (ref 0.20–1.20)
Total Protein: 6.4 g/dL (ref 6.4–8.3)

## 2017-01-15 MED ORDER — PROCHLORPERAZINE EDISYLATE 5 MG/ML IJ SOLN
10.0000 mg | Freq: Once | INTRAMUSCULAR | Status: AC
  Administered 2017-01-15: 10 mg via INTRAVENOUS

## 2017-01-15 MED ORDER — DIPHENHYDRAMINE HCL 50 MG/ML IJ SOLN
25.0000 mg | Freq: Once | INTRAMUSCULAR | Status: AC
  Administered 2017-01-15: 25 mg via INTRAVENOUS

## 2017-01-15 MED ORDER — PROCHLORPERAZINE EDISYLATE 5 MG/ML IJ SOLN
INTRAMUSCULAR | Status: AC
  Filled 2017-01-15: qty 2

## 2017-01-15 MED ORDER — GOSERELIN ACETATE 3.6 MG ~~LOC~~ IMPL
3.6000 mg | DRUG_IMPLANT | Freq: Once | SUBCUTANEOUS | Status: AC
  Administered 2017-01-15: 3.6 mg via SUBCUTANEOUS
  Filled 2017-01-15: qty 3.6

## 2017-01-15 MED ORDER — HEPARIN SOD (PORK) LOCK FLUSH 100 UNIT/ML IV SOLN
500.0000 [IU] | Freq: Once | INTRAVENOUS | Status: AC | PRN
  Administered 2017-01-15: 500 [IU]
  Filled 2017-01-15: qty 5

## 2017-01-15 MED ORDER — PACLITAXEL PROTEIN-BOUND CHEMO INJECTION 100 MG
100.0000 mg/m2 | Freq: Once | INTRAVENOUS | Status: AC
  Administered 2017-01-15: 300 mg via INTRAVENOUS
  Filled 2017-01-15: qty 60

## 2017-01-15 MED ORDER — SODIUM CHLORIDE 0.9% FLUSH
10.0000 mL | INTRAVENOUS | Status: DC | PRN
  Administered 2017-01-15: 10 mL
  Filled 2017-01-15: qty 10

## 2017-01-15 MED ORDER — DIPHENHYDRAMINE HCL 50 MG/ML IJ SOLN
INTRAMUSCULAR | Status: AC
  Filled 2017-01-15: qty 1

## 2017-01-15 MED ORDER — SODIUM CHLORIDE 0.9 % IV SOLN
Freq: Once | INTRAVENOUS | Status: AC
Start: 2017-01-15 — End: 2017-01-15
  Administered 2017-01-15: 10:00:00 via INTRAVENOUS

## 2017-01-15 NOTE — Patient Instructions (Signed)
[facility] Discharge Instructions for Patients Receiving Chemotherapy  Today you received the following chemotherapy agents: Abraxane   To help prevent nausea and vomiting after your treatment, we encourage you to take your nausea medication as directed.    If you develop nausea and vomiting that is not controlled by your nausea medication, call the clinic.   BELOW ARE SYMPTOMS THAT SHOULD BE REPORTED IMMEDIATELY:  *FEVER GREATER THAN 100.5 F  *CHILLS WITH OR WITHOUT FEVER  NAUSEA AND VOMITING THAT IS NOT CONTROLLED WITH YOUR NAUSEA MEDICATION  *UNUSUAL SHORTNESS OF BREATH  *UNUSUAL BRUISING OR BLEEDING  TENDERNESS IN MOUTH AND THROAT WITH OR WITHOUT PRESENCE OF ULCERS  *URINARY PROBLEMS  *BOWEL PROBLEMS  UNUSUAL RASH Items with * indicate a potential emergency and should be followed up as soon as possible.  Feel free to call the clinic you have any questions or concerns. The clinic phone number is [phone].  Please show the CHEMO ALERT CARD at check-in to the Emergency Department and triage nurse.   

## 2017-01-22 ENCOUNTER — Ambulatory Visit (HOSPITAL_BASED_OUTPATIENT_CLINIC_OR_DEPARTMENT_OTHER): Payer: Managed Care, Other (non HMO) | Admitting: Adult Health

## 2017-01-22 ENCOUNTER — Other Ambulatory Visit: Payer: Self-pay | Admitting: Oncology

## 2017-01-22 ENCOUNTER — Ambulatory Visit (HOSPITAL_BASED_OUTPATIENT_CLINIC_OR_DEPARTMENT_OTHER): Payer: Managed Care, Other (non HMO)

## 2017-01-22 ENCOUNTER — Other Ambulatory Visit (HOSPITAL_BASED_OUTPATIENT_CLINIC_OR_DEPARTMENT_OTHER): Payer: Managed Care, Other (non HMO)

## 2017-01-22 VITALS — BP 120/77 | HR 95 | Temp 98.4°F | Resp 18 | Ht 74.0 in | Wt 335.4 lb

## 2017-01-22 DIAGNOSIS — C50211 Malignant neoplasm of upper-inner quadrant of right female breast: Secondary | ICD-10-CM

## 2017-01-22 DIAGNOSIS — Z171 Estrogen receptor negative status [ER-]: Secondary | ICD-10-CM

## 2017-01-22 DIAGNOSIS — I82621 Acute embolism and thrombosis of deep veins of right upper extremity: Secondary | ICD-10-CM

## 2017-01-22 DIAGNOSIS — Z5111 Encounter for antineoplastic chemotherapy: Secondary | ICD-10-CM | POA: Diagnosis not present

## 2017-01-22 LAB — CBC WITH DIFFERENTIAL/PLATELET
BASO%: 0.4 % (ref 0.0–2.0)
Basophils Absolute: 0 10*3/uL (ref 0.0–0.1)
EOS%: 0.9 % (ref 0.0–7.0)
Eosinophils Absolute: 0.1 10*3/uL (ref 0.0–0.5)
HCT: 31.2 % — ABNORMAL LOW (ref 34.8–46.6)
HGB: 9.9 g/dL — ABNORMAL LOW (ref 11.6–15.9)
LYMPH%: 19.4 % (ref 14.0–49.7)
MCH: 27.1 pg (ref 25.1–34.0)
MCHC: 31.7 g/dL (ref 31.5–36.0)
MCV: 85.5 fL (ref 79.5–101.0)
MONO#: 0.2 10*3/uL (ref 0.1–0.9)
MONO%: 4.1 % (ref 0.0–14.0)
NEUT#: 4 10*3/uL (ref 1.5–6.5)
NEUT%: 75.2 % (ref 38.4–76.8)
Platelets: 327 10*3/uL (ref 145–400)
RBC: 3.65 10*6/uL — ABNORMAL LOW (ref 3.70–5.45)
RDW: 18.2 % — ABNORMAL HIGH (ref 11.2–14.5)
WBC: 5.4 10*3/uL (ref 3.9–10.3)
lymph#: 1 10*3/uL (ref 0.9–3.3)

## 2017-01-22 LAB — COMPREHENSIVE METABOLIC PANEL
ALT: 25 U/L (ref 0–55)
AST: 15 U/L (ref 5–34)
Albumin: 3.6 g/dL (ref 3.5–5.0)
Alkaline Phosphatase: 94 U/L (ref 40–150)
Anion Gap: 11 mEq/L (ref 3–11)
BUN: 10.1 mg/dL (ref 7.0–26.0)
CO2: 22 mEq/L (ref 22–29)
Calcium: 9.7 mg/dL (ref 8.4–10.4)
Chloride: 106 mEq/L (ref 98–109)
Creatinine: 0.8 mg/dL (ref 0.6–1.1)
EGFR: 88 mL/min/{1.73_m2} — ABNORMAL LOW (ref >90)
Glucose: 203 mg/dl — ABNORMAL HIGH (ref 70–140)
Potassium: 3.8 mEq/L (ref 3.5–5.1)
Sodium: 139 mEq/L (ref 136–145)
Total Bilirubin: 0.49 mg/dL (ref 0.20–1.20)
Total Protein: 6.9 g/dL (ref 6.4–8.3)

## 2017-01-22 MED ORDER — SODIUM CHLORIDE 0.9 % IV SOLN
Freq: Once | INTRAVENOUS | Status: AC
  Administered 2017-01-22: 12:00:00 via INTRAVENOUS

## 2017-01-22 MED ORDER — PACLITAXEL PROTEIN-BOUND CHEMO INJECTION 100 MG
100.0000 mg/m2 | Freq: Once | INTRAVENOUS | Status: AC
  Administered 2017-01-22: 300 mg via INTRAVENOUS
  Filled 2017-01-22: qty 60

## 2017-01-22 MED ORDER — CHOLESTYRAMINE 4 GM/DOSE PO POWD: 4.0000 g | Freq: Three times a day (TID) | ORAL | 12 refills | Status: DC

## 2017-01-22 MED ORDER — SODIUM CHLORIDE 0.9% FLUSH
10.0000 mL | INTRAVENOUS | Status: DC | PRN
  Administered 2017-01-22: 10 mL
  Filled 2017-01-22: qty 10

## 2017-01-22 MED ORDER — PROCHLORPERAZINE EDISYLATE 5 MG/ML IJ SOLN
INTRAMUSCULAR | Status: AC
  Filled 2017-01-22: qty 2

## 2017-01-22 MED ORDER — DIPHENHYDRAMINE HCL 50 MG/ML IJ SOLN
INTRAMUSCULAR | Status: AC
  Filled 2017-01-22: qty 1

## 2017-01-22 MED ORDER — DIPHENHYDRAMINE HCL 50 MG/ML IJ SOLN
25.0000 mg | Freq: Once | INTRAMUSCULAR | Status: AC
Start: 2017-01-22 — End: 2017-01-22
  Administered 2017-01-22: 25 mg via INTRAVENOUS

## 2017-01-22 MED ORDER — PROCHLORPERAZINE EDISYLATE 5 MG/ML IJ SOLN
10.0000 mg | Freq: Once | INTRAMUSCULAR | Status: AC
Start: 2017-01-22 — End: 2017-01-22
  Administered 2017-01-22: 10 mg via INTRAVENOUS

## 2017-01-22 MED ORDER — HEPARIN SOD (PORK) LOCK FLUSH 100 UNIT/ML IV SOLN
500.0000 [IU] | Freq: Once | INTRAVENOUS | Status: AC | PRN
  Administered 2017-01-22: 500 [IU]
  Filled 2017-01-22: qty 5

## 2017-01-22 NOTE — Patient Instructions (Signed)
Cholestyramine powder for oral suspension What is this medicine? CHOLESTYRAMINE (koe LESS tir a meen) is used to lower cholesterol in patients who are at risk of heart disease or stroke. This medicine is only for patients whose cholesterol level is not controlled by diet. This medicine may be used for other purposes; ask your health care provider or pharmacist if you have questions. COMMON BRAND NAME(S): Locholest, Locholest Light, Prevalite, Questran, Questran Light What should I tell my health care provider before I take this medicine? They need to know if you have any of these conditions: -blocked bile duct -an unusual or allergic reaction to cholestyramine, other medicines, foods, dyes, or preservatives -pregnant or trying to get pregnant -breast-feeding How should I use this medicine? Do not take this medicine in the dry form. It must be mixed with a liquid before swallowing. Follow the directions on the prescription label. Place the powder in a glass or cup. Add between 2 and 6 ounces of fluid. This can be water, milk, pulpy fruit juice, fluid soup, or other liquid. Mix well and drink all of the liquid. Take your doses at regular intervals. Do not take your medicine more often than directed. Talk to your pediatrician regarding the use of this medicine in children. Special care may be needed. Overdosage: If you think you have taken too much of this medicine contact a poison control center or emergency room at once. NOTE: This medicine is only for you. Do not share this medicine with others. What if I miss a dose? If you miss a dose, take it as soon as you can. If it is almost time for your next dose, take only that dose. Do not take double or extra doses. What may interact with this medicine? -diuretics -female hormones, like estrogens or progestins and birth control pills -heart medicines such as digoxin or digitoxin -penicillin  G -phenobarbital -phenylbutazone -phytonadione -propranolol -tetracycline antibiotics -thyroid hormones -vitamin A -vitamin D -vitamin E -warfarin Take other drugs at least 1 hour before or 4 hours after this medicine, to avoid decreasing their absorption. This list may not describe all possible interactions. Give your health care provider a list of all the medicines, herbs, non-prescription drugs, or dietary supplements you use. Also tell them if you smoke, drink alcohol, or use illegal drugs. Some items may interact with your medicine. What should I watch for while using this medicine? Visit your doctor or health care professional for regular checks on your progress. Your blood fats and other tests will be measured from time to time. This medicine is only part of a total cholesterol-lowering program. Your health care professional or dietician can suggest a low-cholesterol and low-fat diet that will reduce your risk of getting heart and blood vessel disease. Avoid alcohol and smoking, and keep a proper exercise schedule. To reduce the chance of getting constipated, drink plenty of water and increase the amount of fiber in your diet. Ask your doctor or health care professional for advice if you are constipated. What side effects may I notice from receiving this medicine? Side effects that you should report to your doctor or health care professional as soon as possible: -allergic reactions like skin rash, itching or hives, swelling of the face, lips, or tongue -bloody or black, tarry stools -severe stomach pain with nausea and vomiting -unusual bleeding Side effects that usually do not require medical attention (report to your doctor or health care professional if they continue or are bothersome): -constipation or diarrhea -dizziness -headache -heartburn,   indigestion -nausea, vomiting -perianal irritation This list may not describe all possible side effects. Call your doctor for medical  advice about side effects. You may report side effects to FDA at 1-800-FDA-1088. Where should I keep my medicine? Keep out of the reach of children. Store at room temperature between 15 and 30 degrees C (59 and 86 degrees F). Throw away any unused medicine after the expiration date. NOTE: This sheet is a summary. It may not cover all possible information. If you have questions about this medicine, talk to your doctor, pharmacist, or health care provider.  2018 Elsevier/Gold Standard (2007-10-05 15:33:42)  

## 2017-01-22 NOTE — Progress Notes (Signed)
Kelly Mendez  [phone] [phone]) [phone]     ID: Kelly Mendez DOB: 24-Nov-1975  MR#: [id number]  RWE#:[id number]  Patient Care Team: [name], NP-C as PCP - General (Family Medicine) [name], MD as Consulting Physician (General Surgery) Magrinat, Virgie Dad, MD as Consulting Physician (Oncology) Eppie Gibson, MD as Attending Physician (Radiation Oncology) Scot Dock, NP OTHER MD:  CHIEF COMPLAINT: Triple negative breast cancer  CURRENT TREATMENT: Neoadjuvant chemotherapy   BREAST CANCER HISTORY: From the original intake note:  Kelly Mendez had her first ever mammogram 10/07/2016 at the [facility]. This showed a possible mass in the right breast. On 10/13/2016 she underwent bilateral diagnostic mammography with tomography and right breast ultrasonography. This found the breast density to be category B. In the upper inner quadrant of the right breast there was a circumscribed mass which was barely palpable. Ultrasonography confirmed a 0.9 cm right breast mass at the 1:00 radiant 18 cm from the nipple ultrasound of the axilla showed 1 indeterminate right axillary lymph node with borderline thickening of the anterior cortex.  On 10/16/2016 the patient underwent biopsy of the right breast mass and the suspicious right axillary lymph node. The right breast mass proved to be an invasive ductal carcinoma, grade 3, estrogen and progesterone receptor negative, with an MIB-1 of 80%, and no HER-2 amplification, the signals ratio being 1.30 and the number per cell 1.75. The lymph node was negative and concordant.  Her subsequent history is as detailed below  INTERVAL HISTORY: Kelly Mendez returns today for follow-up and treatment of her estrogen receptor negative breast cancer. She is here for evaluation prior to receiving Abraxane.  She is tolerating the Abraxane well.  She is not having any issues, particularly with peripheral neuropathy.    REVIEW OF  SYSTEMS: A detailed ROS was conducted and is non contributory.   PAST MEDICAL HISTORY: Past Medical History:  Diagnosis Date  . Abnormal Pap smear of cervix   . Anxiety   . Asthma   . Cancer ([place])   . Concussion    around age 38  . Depression   . Diabetes type 2, uncontrolled ([place])    new diagnosis in 08/2016  . Family history of breast cancer   . Family history of neurofibromatosis   . Family history of ovarian cancer   . HPV in female   . Hyperlipemia   . PTSD (post-traumatic stress disorder)     PAST SURGICAL HISTORY: Past Surgical History:  Procedure Laterality Date  . COLPOSCOPY    . left ovary removed  1978  . PORTACATH PLACEMENT Right 10/29/2016   Procedure: INSERTION PORT-A-CATH WITH [place];  Surgeon: [name], MD;  Location: [facility];  Service: General;  Laterality: Right;    FAMILY HISTORY Family History  Problem Relation Age of Onset  . Breast cancer Mother 51  . Hepatitis C Father   . Ovarian cancer Maternal Aunt        dx in her 16s  . Neurofibromatosis Maternal Uncle   . Brain cancer Maternal Uncle 58  . Lung cancer Maternal Grandfather   . Kidney failure Paternal Grandmother   . Heart attack Paternal Grandfather   . Ovarian cancer Maternal Aunt        dx in her 56s  . Neurofibromatosis Maternal Aunt   . Ovarian cancer Maternal Aunt   . Neurofibromatosis Maternal Aunt   . Cervical cancer Maternal Aunt   . Neurofibromatosis Maternal Aunt   . Cancer Maternal Aunt  cancer on the bottom of her foot  . Breast cancer Other        MGF's sisters  . Neurofibromatosis Other        MGM's paternal aunt  The patient's mother was diagnosed with breast cancer at age 110, but tells me the lump in her breast had been present for at least 10 years prior to that. She is now 8 and doing well. The patient's father died at the age of 15 from sepsis following liver transplantation. The patient has 2 brothers, no sisters. The patient tells me that she has at least 5  and sent cousins with breast cancer and there are other relatives with uterine cancer.  GYNECOLOGIC HISTORY:  No LMP recorded. Menarche age 47, first live birth age 30, the patient is GX P1. She has had multiple progesterone and oral contraceptive treatments through [facility] because of her menometrorrhagia. 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 Her husband Kelly Mendez is a truck Geophysicist/field seismologist. The patient's daughter Kelly Mendez is studying pre-veterinary medicine in college. Kelly Mendez has 3 children, all boys, a keen is a cook in [place] and lives independently. The 2 younger boys, Kelly Mendez and Kelly Mendez, aged 70 and 2 as of April 2018, are at home with the patient    ADVANCED DIRECTIVES: Not in place   HEALTH MAINTENANCE: Social History  Substance Use Topics  . Smoking status: Former Smoker    Packs/day: 0.15    Years: 25.00    Types: Cigarettes    Quit date: 11/25/2016  . Smokeless tobacco: Never Used  . Alcohol use 0.6 oz/week    1 Glasses of wine per week     Colonoscopy: n/a  PAP:  Bone density:   Allergies  Allergen Reactions  . Penicillins Other (See Comments)    As a child Has patient had a PCN reaction causing immediate rash, facial/tongue/throat swelling, SOB or lightheadedness with hypotension: unknown Has patient had a PCN reaction causing severe rash involving mucus membranes or skin necrosis: unknown Has patient had a PCN reaction that required hospitalization unknown Has patient had a PCN reaction occurring within the last 10 years: no If all of the above answers are "NO", then may proceed with Cephalosporin use.     Current Outpatient Prescriptions  Medication Sig Dispense Refill  . atorvastatin (LIPITOR) 20 MG tablet TAKE 1 TABLET BY MOUTH ONCE DAILY 90 tablet 1  . ferrous sulfate 325 (65 FE) MG tablet Take 1 tablet (325 mg total) by mouth 2 (two) times daily with a meal. 60 tablet 3  .  Glucose Blood (BLOOD GLUCOSE TEST STRIPS) STRP Test twice a day. Pt uses one touch verio flex meter 100 each 5  . ibuprofen (ADVIL,MOTRIN) 200 MG tablet Take 400 mg by mouth daily as needed for headache.    . lidocaine-prilocaine (EMLA) cream Apply to affected area once 30 g 3  . magic mouthwash SOLN Take 5 mLs by mouth 4 (four) times daily. (Patient taking differently: Take 5 mLs by mouth 4 (four) times daily as needed for mouth pain. ) 240 mL 3  . Melatonin 10 MG TABS Take 10 mg by mouth at bedtime.    . metFORMIN (GLUCOPHAGE) 1000 MG tablet Take 1 tablet (1,000 mg total) by mouth 2 (two) times daily with a meal. 60 tablet 2  . ondansetron (ZOFRAN) 8 MG tablet Take 1 tablet (8 mg total) by mouth every 8 (eight) hours  as needed for nausea or vomiting. 20 tablet 0  . ONETOUCH DELICA LANCETS FINE MISC Test twice a day 100 each 5  . prochlorperazine (COMPAZINE) 10 MG tablet Take 1 tablet (10 mg total) by mouth every 6 (six) hours as needed (Nausea or vomiting). 30 tablet 1  . rivaroxaban (XARELTO) 20 MG TABS tablet Take 1 tablet (20 mg total) by mouth daily with supper. 30 tablet 3  . cholestyramine (QUESTRAN) 4 GM/DOSE powder Take 1 packet (4 g total) by mouth 3 (three) times daily with meals. 378 g 12   No current facility-administered medications for this visit.     OBJECTIVE:   Vitals:   01/22/17 1118  BP: 120/77  Pulse: 95  Resp: 18  Temp: 98.4 F (36.9 C)     Body mass index is 43.06 kg/m.    ECOG FS:0 - Asymptomatic GENERAL: Patient is a well appearing female in no acute distress HEENT:  Sclerae anicteric.  Oropharynx clear and moist. No ulcerations or evidence of oropharyngeal candidiasis. Neck is supple.  NODES:  No cervical, supraclavicular, or axillary lymphadenopathy palpated.  BREAST EXAM:  Deferred. LUNGS:  Clear to auscultation bilaterally.  No wheezes or rhonchi. HEART:  Regular rate and rhythm. No murmur appreciated. ABDOMEN:  Soft, nontender.  Positive, normoactive  bowel sounds. No organomegaly palpated. MSK:  No focal spinal tenderness to palpation. Full range of motion bilaterally in the upper extremities. EXTREMITIES:  No peripheral edema.   SKIN:  Clear with no obvious rashes or skin changes. No nail dyscrasia. NEURO:  Nonfocal. Well oriented.  Appropriate affect.   LAB RESULTS:  CMP     Component Value Date/Time   NA 139 01/22/2017 1047   K 3.8 01/22/2017 1047   CL 107 12/18/2016 0500   CO2 22 01/22/2017 1047   GLUCOSE 203 (H) 01/22/2017 1047   BUN 10.1 01/22/2017 1047   CREATININE 0.8 01/22/2017 1047   CALCIUM 9.7 01/22/2017 1047   PROT 6.9 01/22/2017 1047   ALBUMIN 3.6 01/22/2017 1047   AST 15 01/22/2017 1047   ALT 25 01/22/2017 1047   ALKPHOS 94 01/22/2017 1047   BILITOT 0.49 01/22/2017 1047   GFRNONAA >60 12/18/2016 0500   GFRAA >60 12/18/2016 0500    No results found for: TOTALPROTELP, ALBUMINELP, A1GS, A2GS, BETS, BETA2SER, GAMS, MSPIKE, SPEI  No results found for: Nils Pyle, [facility]  Lab Results  Component Value Date   WBC 5.4 01/22/2017   NEUTROABS 4.0 01/22/2017   HGB 9.9 (L) 01/22/2017   HCT 31.2 (L) 01/22/2017   MCV 85.5 01/22/2017   PLT 327 01/22/2017      Chemistry      Component Value Date/Time   NA 139 01/22/2017 1047   K 3.8 01/22/2017 1047   CL 107 12/18/2016 0500   CO2 22 01/22/2017 1047   BUN 10.1 01/22/2017 1047   CREATININE 0.8 01/22/2017 1047      Component Value Date/Time   CALCIUM 9.7 01/22/2017 1047   ALKPHOS 94 01/22/2017 1047   AST 15 01/22/2017 1047   ALT 25 01/22/2017 1047   BILITOT 0.49 01/22/2017 1047       No results found for: LABCA2  No components found for: [id number]  No results for input(s): INR in the last 168 hours.  Urinalysis    Component Value Date/Time   BILIRUBINUR n 09/29/2016 1045   PROTEINUR trace 09/29/2016 1045   UROBILINOGEN negative 09/29/2016 1045   NITRITE n 09/29/2016 1045   LEUKOCYTESUR Negative 09/29/2016 1045  STUDIES: No results found.  ELIGIBLE FOR AVAILABLE RESEARCH PROTOCOL: not a candidate for PREVENT  ASSESSMENT: 41 y.o.  Kelly Mendez woman status post right breast upper inner quadrant biopsy 10/16/2016 for a clinical T1b pN0, stage IB invasive ductal carcinoma, grade 3, triple negative, with an MIB-1 of 80%.  (1)  genetics testing 12/01/2016 showed several variants of uncertain significance but no deleterious mutation  (2) neoadjuvant chemotherapy to consist of doxorubicin and cyclophosphamide in dose dense fashion 4 starting 11/05/2016, received third cycle 12/03/2016 then proceeded to weekly Abraxane 12 starting 01/01/2017  (a) cycle 4 of cyclophosphamide and doxorubicin to be given at the end of the Abraxane treatments  (3) breast conserving surgery to follow  (4) adjuvant radiation to follow chemotherapy     (5) extensive right upper extremity deep venous thrombosis documented 11/23/2016, treated initially with Lovenox  (a) transitioned to rivaroxaban as of 11/30/2016  (b) total 6 months anticoagulation planned (one month beyond port removal)  (6) tobacco abuse: The patient quit smoking 11/26/2016  (7) menometrorrhagia, with a cystic right adnexal lesion and a possible cervical polyp noted on ultrasound 12/16/2016, with benign endocervical curettage and endometrial biopsy 12/16/2016  (a) CA 125 12/17/2016 was 14.2 (normal).  (b) adnexal mass, likely benign, will be explored at the completion of chemotherapy  (8) goserelin started 12/16/2016    PLAN: Kelly Mendez is doing well today.  She is tolerating chemotherapy without difficulty.  She will proceed with Abraxane today. Her labs remain stable and I reviewed those with her in detail today.   She is going to [place], and will not receive treatment next week.  She will return on 7/26 for labs, an appt with Dr. Jana Hakim, and her next treatment.    She knows to call for any questions or concerns prior to her next  appointment here.  A total of (20) minutes of face-to-face time was spent with this patient with greater than 50% of that time in counseling and care-coordination.    Scot Dock, NP   01/24/2017 8:01 AM Medical Oncology and Hematology [facility] [address] [place], [place] [address] Tel. [phone]    Fax. [phone]

## 2017-01-22 NOTE — Patient Instructions (Signed)
[facility] Discharge Instructions for Patients Receiving Chemotherapy  Today you received the following chemotherapy agents: Abraxane   To help prevent nausea and vomiting after your treatment, we encourage you to take your nausea medication as directed.    If you develop nausea and vomiting that is not controlled by your nausea medication, call the clinic.   BELOW ARE SYMPTOMS THAT SHOULD BE REPORTED IMMEDIATELY:  *FEVER GREATER THAN 100.5 F  *CHILLS WITH OR WITHOUT FEVER  NAUSEA AND VOMITING THAT IS NOT CONTROLLED WITH YOUR NAUSEA MEDICATION  *UNUSUAL SHORTNESS OF BREATH  *UNUSUAL BRUISING OR BLEEDING  TENDERNESS IN MOUTH AND THROAT WITH OR WITHOUT PRESENCE OF ULCERS  *URINARY PROBLEMS  *BOWEL PROBLEMS  UNUSUAL RASH Items with * indicate a potential emergency and should be followed up as soon as possible.  Feel free to call the clinic you have any questions or concerns. The clinic phone number is [phone].  Please show the CHEMO ALERT CARD at check-in to the Emergency Department and triage nurse.   

## 2017-01-23 ENCOUNTER — Other Ambulatory Visit: Payer: Self-pay | Admitting: *Deleted

## 2017-01-24 ENCOUNTER — Encounter: Payer: Self-pay | Admitting: Adult Health

## 2017-01-29 ENCOUNTER — Other Ambulatory Visit: Payer: Managed Care, Other (non HMO)

## 2017-01-29 ENCOUNTER — Ambulatory Visit: Payer: Managed Care, Other (non HMO)

## 2017-02-05 ENCOUNTER — Ambulatory Visit (HOSPITAL_BASED_OUTPATIENT_CLINIC_OR_DEPARTMENT_OTHER): Payer: Managed Care, Other (non HMO) | Admitting: Oncology

## 2017-02-05 ENCOUNTER — Ambulatory Visit: Payer: Managed Care, Other (non HMO)

## 2017-02-05 ENCOUNTER — Other Ambulatory Visit (HOSPITAL_BASED_OUTPATIENT_CLINIC_OR_DEPARTMENT_OTHER): Payer: Managed Care, Other (non HMO)

## 2017-02-05 VITALS — BP 125/82 | HR 82 | Temp 98.8°F | Resp 18 | Ht 74.0 in | Wt 347.0 lb

## 2017-02-05 DIAGNOSIS — I82621 Acute embolism and thrombosis of deep veins of right upper extremity: Secondary | ICD-10-CM | POA: Diagnosis not present

## 2017-02-05 DIAGNOSIS — N83201 Unspecified ovarian cyst, right side: Secondary | ICD-10-CM | POA: Diagnosis not present

## 2017-02-05 DIAGNOSIS — D62 Acute posthemorrhagic anemia: Secondary | ICD-10-CM

## 2017-02-05 DIAGNOSIS — C50211 Malignant neoplasm of upper-inner quadrant of right female breast: Secondary | ICD-10-CM

## 2017-02-05 DIAGNOSIS — Z171 Estrogen receptor negative status [ER-]: Secondary | ICD-10-CM

## 2017-02-05 DIAGNOSIS — E119 Type 2 diabetes mellitus without complications: Secondary | ICD-10-CM

## 2017-02-05 DIAGNOSIS — I82A11 Acute embolism and thrombosis of right axillary vein: Secondary | ICD-10-CM

## 2017-02-05 DIAGNOSIS — R609 Edema, unspecified: Secondary | ICD-10-CM | POA: Diagnosis not present

## 2017-02-05 DIAGNOSIS — G620 Drug-induced polyneuropathy: Secondary | ICD-10-CM | POA: Diagnosis not present

## 2017-02-05 LAB — CBC WITH DIFFERENTIAL/PLATELET
BASO%: 1.1 % (ref 0.0–2.0)
Basophils Absolute: 0.1 10*3/uL (ref 0.0–0.1)
EOS%: 0.9 % (ref 0.0–7.0)
Eosinophils Absolute: 0.1 10*3/uL (ref 0.0–0.5)
HCT: 32.8 % — ABNORMAL LOW (ref 34.8–46.6)
HGB: 10.7 g/dL — ABNORMAL LOW (ref 11.6–15.9)
LYMPH%: 14.3 % (ref 14.0–49.7)
MCH: 27.1 pg (ref 25.1–34.0)
MCHC: 32.6 g/dL (ref 31.5–36.0)
MCV: 83.2 fL (ref 79.5–101.0)
MONO#: 0.5 10*3/uL (ref 0.1–0.9)
MONO%: 7.2 % (ref 0.0–14.0)
NEUT#: 5.4 10*3/uL (ref 1.5–6.5)
NEUT%: 76.5 % (ref 38.4–76.8)
Platelets: 298 10*3/uL (ref 145–400)
RBC: 3.94 10*6/uL (ref 3.70–5.45)
RDW: 19.5 % — ABNORMAL HIGH (ref 11.2–14.5)
WBC: 7.1 10*3/uL (ref 3.9–10.3)
lymph#: 1 10*3/uL (ref 0.9–3.3)

## 2017-02-05 LAB — COMPREHENSIVE METABOLIC PANEL
ALT: 22 U/L (ref 0–55)
AST: 18 U/L (ref 5–34)
Albumin: 3.3 g/dL — ABNORMAL LOW (ref 3.5–5.0)
Alkaline Phosphatase: 99 U/L (ref 40–150)
Anion Gap: 8 mEq/L (ref 3–11)
BUN: 7.8 mg/dL (ref 7.0–26.0)
CO2: 24 mEq/L (ref 22–29)
Calcium: 9.3 mg/dL (ref 8.4–10.4)
Chloride: 108 mEq/L (ref 98–109)
Creatinine: 0.7 mg/dL (ref 0.6–1.1)
EGFR: >90 mL/min/{1.73_m2} (ref >90)
Glucose: 155 mg/dl — ABNORMAL HIGH (ref 70–140)
Potassium: 4 mEq/L (ref 3.5–5.1)
Sodium: 140 mEq/L (ref 136–145)
Total Bilirubin: 0.38 mg/dL (ref 0.20–1.20)
Total Protein: 6.4 g/dL (ref 6.4–8.3)

## 2017-02-05 NOTE — Progress Notes (Signed)
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 This showed a possible mass in the right breast. On 10/13/2016 she underwent bilateral diagnostic mammography with tomography and right breast ultrasonography. This found the breast density to be category B. In the upper inner quadrant of the right breast there was a circumscribed mass which was barely palpable. Ultrasonography confirmed a 0.9 cm right breast mass at the 1:00 radiant 18 cm from the nipple ultrasound of the axilla showed 1 indeterminate right axillary lymph node with borderline thickening of the anterior cortex.  On 10/16/2016 the patient underwent biopsy of the right breast mass and the suspicious right axillary lymph node. The right breast mass proved to be an invasive ductal carcinoma, grade 3, estrogen and progesterone receptor negative, with an MIB-1 of 80%, and no HER-2 amplification, the signals ratio being 1.30 and the number per cell 1.75. The lymph node was negative and concordant.  Her subsequent history is as detailed below  INTERVAL HISTORY: Kelly Mendez returns today for follow-up and treatment of her estrogen receptor positive breast cancer. She is currently receiving weekly Abraxane, this being the fifth of 12 planned doses. Once she completes the Abraxane treatments she will make up the doxorubicin/cyclophosphamide  dose that she missed earlier.  REVIEW OF SYSTEMS: She just returned from a trip to [place] and spent a great deal of time in the car. She's had a little bit of bilateral ankle swelling. What concerns me more than that is as she's developed numbness in her second and third toe of the left foot. This is on the tips of the toes. She has a little bit of numbness in the right foot but not as pronounced breast clear. She has no problems with her hands. Other issues include diarrhea which resolved with Questran. She continues on Xarelto, with no bleeding or bruising problems. A detailed review of systems today was otherwise stable   PAST MEDICAL HISTORY: Past Medical History:  Diagnosis Date  . Abnormal Pap smear of cervix   . Anxiety   . Asthma   . Cancer ([place])   . Concussion    around age 77  . Depression   . Diabetes type 2, uncontrolled ([place])    new diagnosis in 08/2016  . Family history of breast cancer   . Family history of neurofibromatosis   . Family history of ovarian cancer   . HPV in female   . Hyperlipemia   . PTSD (post-traumatic stress disorder)     PAST SURGICAL HISTORY: Past Surgical History:  Procedure Laterality Date  . COLPOSCOPY    . left ovary removed  1978  . PORTACATH PLACEMENT Right 10/29/2016   Procedure: INSERTION PORT-A-CATH WITH [place];  Surgeon: [name], MD;  Location: [facility];  Service: General;  Laterality: Right;    FAMILY HISTORY Family History  Problem Relation Age of Onset  . Breast cancer Mother 52  . Hepatitis C Father   . Ovarian cancer Maternal Aunt  dx in her 7s  . Neurofibromatosis Maternal Uncle   . Brain cancer Maternal Uncle 58  . Lung cancer Maternal Grandfather   . Kidney failure Paternal Grandmother   . Heart attack Paternal Grandfather   . Ovarian cancer Maternal Aunt        dx in her 46s  . Neurofibromatosis Maternal Aunt   . Ovarian cancer Maternal Aunt   . Neurofibromatosis Maternal Aunt   . Cervical cancer  Maternal Aunt   . Neurofibromatosis Maternal Aunt   . Cancer Maternal Aunt        cancer on the bottom of her foot  . Breast cancer Other        MGF's sisters  . Neurofibromatosis Other        MGM's paternal aunt  The patient's mother was diagnosed with breast cancer at age 55, but tells me the lump in her breast had been present for at least 10 years prior to that. She is now 26 and doing well. The patient's father died at the age of 41 from sepsis following liver transplantation. The patient has 2 brothers, no sisters. The patient tells me that she has at least 5 and sent cousins with breast cancer and there are other relatives with uterine cancer.  GYNECOLOGIC HISTORY:  No LMP recorded. Menarche age 27, first live birth age 77, the patient is GX P1. She has had multiple progesterone and oral contraceptive treatments through [facility] because of her menometrorrhagia. 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 Her husband Kelly Mendez is a truck Geophysicist/field seismologist. The patient's daughter Kelly Mendez is studying pre-veterinary medicine in college. Kelly Mendez has 3 children, all boys, a keen is a cook in [place] and lives independently. The 2 younger boys, [name] and [name], aged 31 and 72 as of April 2018, are at home with the patient    ADVANCED DIRECTIVES: Not in place   HEALTH MAINTENANCE: Social History  Substance Use Topics  . Smoking status: Former Smoker    Packs/day: 0.15    Years: 25.00    Types: Cigarettes    Quit date: 11/25/2016  . Smokeless tobacco: Never Used  . Alcohol use 0.6 oz/week    1 Glasses of wine per week     Colonoscopy: n/a  PAP:  Bone density:   Allergies  Allergen Reactions  . Penicillins Other (See Comments)    As a child Has patient had a PCN reaction causing immediate rash, facial/tongue/throat swelling, SOB or lightheadedness with hypotension: unknown Has patient had a PCN reaction  causing severe rash involving mucus membranes or skin necrosis: unknown Has patient had a PCN reaction that required hospitalization unknown Has patient had a PCN reaction occurring within the last 10 years: no If all of the above answers are "NO", then may proceed with Cephalosporin use.     Current Outpatient Prescriptions  Medication Sig Dispense Refill  . atorvastatin (LIPITOR) 20 MG tablet TAKE 1 TABLET BY MOUTH ONCE DAILY 90 tablet 1  . cholestyramine (QUESTRAN) 4 GM/DOSE powder Take 1 packet (4 g total) by mouth 3 (three) times daily with meals. 378 g 12  . ferrous sulfate 325 (65 FE) MG tablet Take 1 tablet (325 mg total) by mouth 2 (two) times daily with a meal. 60 tablet 3  . Glucose Blood (BLOOD GLUCOSE TEST STRIPS) STRP Test twice a day. Pt uses one touch verio flex meter 100 each 5  . ibuprofen (  ADVIL,MOTRIN) 200 MG tablet Take 400 mg by mouth daily as needed for headache.    . lidocaine-prilocaine (EMLA) cream Apply to affected area once 30 g 3  . magic mouthwash SOLN Take 5 mLs by mouth 4 (four) times daily. (Patient taking differently: Take 5 mLs by mouth 4 (four) times daily as needed for mouth pain. ) 240 mL 3  . Melatonin 10 MG TABS Take 10 mg by mouth at bedtime.    . metFORMIN (GLUCOPHAGE) 1000 MG tablet Take 1 tablet (1,000 mg total) by mouth 2 (two) times daily with a meal. 60 tablet 2  . ondansetron (ZOFRAN) 8 MG tablet Take 1 tablet (8 mg total) by mouth every 8 (eight) hours as needed for nausea or vomiting. 20 tablet 0  . ONETOUCH DELICA LANCETS FINE MISC Test twice a day 100 each 5  . prochlorperazine (COMPAZINE) 10 MG tablet Take 1 tablet (10 mg total) by mouth every 6 (six) hours as needed (Nausea or vomiting). 30 tablet 1  . rivaroxaban (XARELTO) 20 MG TABS tablet Take 1 tablet (20 mg total) by mouth daily with supper. 30 tablet 3   No current facility-administered medications for this visit.     OBJECTIVE: Young African-American woman who appears  well  There were no vitals filed for this visit.   There is no height or weight on file to calculate BMI.    ECOG FS:0 - Asymptomatic  Sclerae unicteric, EOMs intact Oropharynx clear and moist No cervical or supraclavicular adenopathy Lungs no rales or rhonchi Heart regular rate and rhythm Abd soft, nontender, positive bowel sounds MSK no focal spinal tenderness, no upper extremity lymphedema Neuro: nonfocal, well oriented, appropriate affect Breasts: Deferred   LAB RESULTS:  CMP     Component Value Date/Time   NA 139 01/22/2017 1047   K 3.8 01/22/2017 1047   CL 107 12/18/2016 0500   CO2 22 01/22/2017 1047   GLUCOSE 203 (H) 01/22/2017 1047   BUN 10.1 01/22/2017 1047   CREATININE 0.8 01/22/2017 1047   CALCIUM 9.7 01/22/2017 1047   PROT 6.9 01/22/2017 1047   ALBUMIN 3.6 01/22/2017 1047   AST 15 01/22/2017 1047   ALT 25 01/22/2017 1047   ALKPHOS 94 01/22/2017 1047   BILITOT 0.49 01/22/2017 1047   GFRNONAA >60 12/18/2016 0500   GFRAA >60 12/18/2016 0500    No results found for: TOTALPROTELP, ALBUMINELP, A1GS, A2GS, BETS, BETA2SER, GAMS, MSPIKE, SPEI  No results found for: Nils Pyle, KAPLAMBRATIO  Lab Results  Component Value Date   WBC 5.4 01/22/2017   NEUTROABS 4.0 01/22/2017   HGB 9.9 (L) 01/22/2017   HCT 31.2 (L) 01/22/2017   MCV 85.5 01/22/2017   PLT 327 01/22/2017      Chemistry      Component Value Date/Time   NA 139 01/22/2017 1047   K 3.8 01/22/2017 1047   CL 107 12/18/2016 0500   CO2 22 01/22/2017 1047   BUN 10.1 01/22/2017 1047   CREATININE 0.8 01/22/2017 1047      Component Value Date/Time   CALCIUM 9.7 01/22/2017 1047   ALKPHOS 94 01/22/2017 1047   AST 15 01/22/2017 1047   ALT 25 01/22/2017 1047   BILITOT 0.49 01/22/2017 1047       No results found for: LABCA2  No components found for: [id number]  No results for input(s): INR in the last 168 hours.  Urinalysis    Component Value Date/Time   BILIRUBINUR n 09/29/2016  1045   PROTEINUR trace  09/29/2016 1045   UROBILINOGEN negative 09/29/2016 1045   NITRITE n 09/29/2016 1045   LEUKOCYTESUR Negative 09/29/2016 1045     STUDIES: No results found.  ELIGIBLE FOR AVAILABLE RESEARCH PROTOCOL: not a candidate for PREVENT  ASSESSMENT: 41 y.o.  [employer] woman status post right breast upper inner quadrant biopsy 10/16/2016 for a clinical T1b pN0, stage IB invasive ductal carcinoma, grade 3, triple negative, with an MIB-1 of 80%.  (1)  genetics testing 12/01/2016 showed several variants of uncertain significance but no deleterious mutation  (2) neoadjuvant chemotherapy to consist of doxorubicin and cyclophosphamide in dose dense fashion 4 starting 11/05/2016, received third cycle 12/03/2016 then proceeded to weekly Abraxane 12 starting 01/01/2017  (a) cycle 4 of cyclophosphamide and doxorubicin to be given at the end of the Abraxane treatments  (b) cycle 5 of weekly Abraxane delayed because of incipient peripheral neuropathy  (3) breast conserving surgery to follow  (4) adjuvant radiation to follow chemotherapy     (5) extensive right upper extremity deep venous thrombosis documented 11/23/2016, treated initially with Lovenox  (a) transitioned to rivaroxaban as of 11/30/2016  (b) total 6 months anticoagulation planned (one month beyond port removal)  (6) tobacco abuse: The patient quit smoking 11/26/2016  (7) menometrorrhagia, with a cystic right adnexal lesion and a possible cervical polyp noted on ultrasound 12/16/2016, with benign endocervical curettage and endometrial biopsy 12/16/2016  (a) CA 125 12/17/2016 was 14.2 (normal).  (b) adnexal mass, likely benign, will be explored at the completion of chemotherapy  (8) goserelin started 12/16/2016    PLAN: Elijah has begun to develop some peripheral neuropathy after only 4 doses of Abraxane. Doubtless this is related to her history of diabetes.  I don't think it is safe for [place] to proceed with  chemotherapy today. She will return in 1 week. If she is still having any peripheral neuropathy symptoms at that time we will simply stop the Abraxane and give her her last cycle of doxorubicin and cyclophosphamide.  I have gone ahead and made her an appointment with our nurse practitioner for August 2 to make that decision. I have entered AC orders, for one cycle, dated 9/20-- if we do go with [facility] next week the abraxane treatments should be canceled and the date of the [facility] changed to 8/2.  We will in any case see her again 8/9. Likely we will schedule her for repeat breast MRI at that time.  Kalima has a good understanding of this plan. She agrees with it. She knows a goal of treatment in her case is cure. She will call with any problems that may develop before her next visit here.      is doing well today.  She is tolerating chemotherapy without difficulty.  She will proceed with Abraxane today. Her labs remain stable and I reviewed those with her in detail today.   She is going to [place], and will not receive treatment next week.  She will return on 7/26 for labs, an appt with Dr. Jana Hakim, and her next treatment.    She knows to call for any questions or concerns prior to her next appointment here.  A total of (20) minutes of face-to-face time was spent with this patient with greater than 50% of that time in counseling and care-coordination.    Chauncey Cruel, MD   02/05/2017 10:04 AM Medical Oncology and Hematology [facility] [address] [place], [place] [address] Tel. [phone]    Fax. [phone]

## 2017-02-12 ENCOUNTER — Ambulatory Visit (HOSPITAL_BASED_OUTPATIENT_CLINIC_OR_DEPARTMENT_OTHER): Payer: Managed Care, Other (non HMO)

## 2017-02-12 ENCOUNTER — Encounter: Payer: Self-pay | Admitting: *Deleted

## 2017-02-12 ENCOUNTER — Other Ambulatory Visit: Payer: Self-pay | Admitting: Hematology and Oncology

## 2017-02-12 ENCOUNTER — Other Ambulatory Visit (HOSPITAL_BASED_OUTPATIENT_CLINIC_OR_DEPARTMENT_OTHER): Payer: Managed Care, Other (non HMO)

## 2017-02-12 ENCOUNTER — Other Ambulatory Visit: Payer: Self-pay

## 2017-02-12 ENCOUNTER — Ambulatory Visit (HOSPITAL_BASED_OUTPATIENT_CLINIC_OR_DEPARTMENT_OTHER): Payer: Managed Care, Other (non HMO) | Admitting: Adult Health

## 2017-02-12 ENCOUNTER — Encounter: Payer: Self-pay | Admitting: Adult Health

## 2017-02-12 VITALS — BP 141/95 | HR 72

## 2017-02-12 VITALS — BP 150/91 | HR 83 | Temp 98.4°F | Resp 20 | Ht 74.0 in | Wt 341.3 lb

## 2017-02-12 DIAGNOSIS — Z171 Estrogen receptor negative status [ER-]: Secondary | ICD-10-CM

## 2017-02-12 DIAGNOSIS — G620 Drug-induced polyneuropathy: Secondary | ICD-10-CM

## 2017-02-12 DIAGNOSIS — Z5111 Encounter for antineoplastic chemotherapy: Secondary | ICD-10-CM

## 2017-02-12 DIAGNOSIS — I82621 Acute embolism and thrombosis of deep veins of right upper extremity: Secondary | ICD-10-CM | POA: Diagnosis not present

## 2017-02-12 DIAGNOSIS — C50211 Malignant neoplasm of upper-inner quadrant of right female breast: Secondary | ICD-10-CM

## 2017-02-12 DIAGNOSIS — N920 Excessive and frequent menstruation with regular cycle: Secondary | ICD-10-CM

## 2017-02-12 DIAGNOSIS — N9489 Other specified conditions associated with female genital organs and menstrual cycle: Secondary | ICD-10-CM

## 2017-02-12 DIAGNOSIS — H539 Unspecified visual disturbance: Secondary | ICD-10-CM | POA: Diagnosis not present

## 2017-02-12 LAB — CBC WITH DIFFERENTIAL/PLATELET
BASO%: 0.2 % (ref 0.0–2.0)
Basophils Absolute: 0 10*3/uL (ref 0.0–0.1)
EOS%: 0.8 % (ref 0.0–7.0)
Eosinophils Absolute: 0.1 10*3/uL (ref 0.0–0.5)
HCT: 34.3 % — ABNORMAL LOW (ref 34.8–46.6)
HGB: 10.5 g/dL — ABNORMAL LOW (ref 11.6–15.9)
LYMPH%: 8.5 % — ABNORMAL LOW (ref 14.0–49.7)
MCH: 26.1 pg (ref 25.1–34.0)
MCHC: 30.6 g/dL — ABNORMAL LOW (ref 31.5–36.0)
MCV: 85.3 fL (ref 79.5–101.0)
MONO#: 0.5 10*3/uL (ref 0.1–0.9)
MONO%: 3.7 % (ref 0.0–14.0)
NEUT#: 11.3 10*3/uL — ABNORMAL HIGH (ref 1.5–6.5)
NEUT%: 86.8 % — ABNORMAL HIGH (ref 38.4–76.8)
Platelets: 287 10*3/uL (ref 145–400)
RBC: 4.02 10*6/uL (ref 3.70–5.45)
RDW: 17 % — ABNORMAL HIGH (ref 11.2–14.5)
WBC: 13 10*3/uL — ABNORMAL HIGH (ref 3.9–10.3)
lymph#: 1.1 10*3/uL (ref 0.9–3.3)

## 2017-02-12 LAB — COMPREHENSIVE METABOLIC PANEL
ALT: 21 U/L (ref 0–55)
AST: 17 U/L (ref 5–34)
Albumin: 3.3 g/dL — ABNORMAL LOW (ref 3.5–5.0)
Alkaline Phosphatase: 112 U/L (ref 40–150)
Anion Gap: 10 mEq/L (ref 3–11)
BUN: 11.7 mg/dL (ref 7.0–26.0)
CO2: 23 mEq/L (ref 22–29)
Calcium: 9.9 mg/dL (ref 8.4–10.4)
Chloride: 103 mEq/L (ref 98–109)
Creatinine: 0.8 mg/dL (ref 0.6–1.1)
EGFR: >90 mL/min/{1.73_m2} (ref >90)
Glucose: 208 mg/dl — ABNORMAL HIGH (ref 70–140)
Potassium: 4 mEq/L (ref 3.5–5.1)
Sodium: 136 mEq/L (ref 136–145)
Total Bilirubin: 0.41 mg/dL (ref 0.20–1.20)
Total Protein: 6.4 g/dL (ref 6.4–8.3)

## 2017-02-12 MED ORDER — SODIUM CHLORIDE 0.9% FLUSH
10.0000 mL | INTRAVENOUS | Status: DC | PRN
  Administered 2017-02-12: 10 mL
  Filled 2017-02-12: qty 10

## 2017-02-12 MED ORDER — SODIUM CHLORIDE 0.9 % IV SOLN
Freq: Once | INTRAVENOUS | Status: AC
  Administered 2017-02-12: 09:00:00 via INTRAVENOUS

## 2017-02-12 MED ORDER — PALONOSETRON HCL INJECTION 0.25 MG/5ML
INTRAVENOUS | Status: AC
  Filled 2017-02-12: qty 5

## 2017-02-12 MED ORDER — SODIUM CHLORIDE 0.9 % IV SOLN
Freq: Once | INTRAVENOUS | Status: AC
  Administered 2017-02-12: 11:00:00 via INTRAVENOUS
  Filled 2017-02-12: qty 5

## 2017-02-12 MED ORDER — DOXORUBICIN HCL CHEMO IV INJECTION 2 MG/ML
60.0000 mg/m2 | Freq: Once | INTRAVENOUS | Status: AC
  Administered 2017-02-12: 170 mg via INTRAVENOUS
  Filled 2017-02-12: qty 85

## 2017-02-12 MED ORDER — GOSERELIN ACETATE 3.6 MG ~~LOC~~ IMPL
3.6000 mg | DRUG_IMPLANT | Freq: Once | SUBCUTANEOUS | Status: AC
  Administered 2017-02-12: 3.6 mg via SUBCUTANEOUS
  Filled 2017-02-12: qty 3.6

## 2017-02-12 MED ORDER — PALONOSETRON HCL INJECTION 0.25 MG/5ML
0.2500 mg | Freq: Once | INTRAVENOUS | Status: AC
  Administered 2017-02-12: 0.25 mg via INTRAVENOUS

## 2017-02-12 MED ORDER — CYCLOPHOSPHAMIDE CHEMO INJECTION 1 GM
600.0000 mg/m2 | Freq: Once | INTRAMUSCULAR | Status: AC
  Administered 2017-02-12: 1700 mg via INTRAVENOUS
  Filled 2017-02-12: qty 85

## 2017-02-12 MED ORDER — HEPARIN SOD (PORK) LOCK FLUSH 100 UNIT/ML IV SOLN
500.0000 [IU] | Freq: Once | INTRAVENOUS | Status: AC | PRN
  Administered 2017-02-12: 500 [IU]
  Filled 2017-02-12: qty 5

## 2017-02-12 NOTE — Patient Instructions (Signed)
[facility] Discharge Instructions for Patients Receiving Chemotherapy  Today you received the following chemotherapy agents:Adriamycin and Cytoxan   To help prevent nausea and vomiting after your treatment, we encourage you to take your nausea medication as directed.    If you develop nausea and vomiting that is not controlled by your nausea medication, call the clinic.   BELOW ARE SYMPTOMS THAT SHOULD BE REPORTED IMMEDIATELY:  *FEVER GREATER THAN 100.5 F  *CHILLS WITH OR WITHOUT FEVER  NAUSEA AND VOMITING THAT IS NOT CONTROLLED WITH YOUR NAUSEA MEDICATION  *UNUSUAL SHORTNESS OF BREATH  *UNUSUAL BRUISING OR BLEEDING  TENDERNESS IN MOUTH AND THROAT WITH OR WITHOUT PRESENCE OF ULCERS  *URINARY PROBLEMS  *BOWEL PROBLEMS  UNUSUAL RASH Items with * indicate a potential emergency and should be followed up as soon as possible.  Feel free to call the clinic you have any questions or concerns. The clinic phone number is [phone].  Please show the CHEMO ALERT CARD at check-in to the Emergency Department and triage nurse.   

## 2017-02-12 NOTE — Progress Notes (Signed)
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 This showed a possible mass in the right breast. On 10/13/2016 she underwent bilateral diagnostic mammography with tomography and right breast ultrasonography. This found the breast density to be category B. In the upper inner quadrant of the right breast there was a circumscribed mass which was barely palpable. Ultrasonography confirmed a 0.9 cm right breast mass at the 1:00 radiant 18 cm from the nipple ultrasound of the axilla showed 1 indeterminate right axillary lymph node with borderline thickening of the anterior cortex.  On 10/16/2016 the patient underwent biopsy of the right breast mass and the suspicious right axillary lymph node. The right breast mass proved to be an invasive ductal carcinoma, grade 3, estrogen and progesterone receptor negative, with an MIB-1 of 80%, and no HER-2 amplification, the signals ratio being 1.30 and the number per cell 1.75. The lymph node was negative and concordant.  Her subsequent history is as detailed below  INTERVAL HISTORY: Kelly Mendez returns today for follow-up and treatment of her estrogen receptor positive breast cancer. She is currently receiving weekly Abraxane, but it was skipped last week due to peripheral neuroapthy.  She cannot feel anything in her third, fourth and fifth toe down to her  ankle bilaterally.  She is having difficulty with balance recently.    Meggan also tells me that she has had vision changes in her left peripheral vision.  Her vision will intermittently get blurry in her left peripheral vision.  She has an optometrist that she sees and her last vision exam in January was normal.    REVIEW OF SYSTEMS: Gabrella denies fevers, chills, nausea, vomiting, constipation, diarrhea, mucositis, skin changes, or any further concerns.     PAST MEDICAL HISTORY: Past Medical History:  Diagnosis Date  . Abnormal Pap smear of cervix   . Anxiety   . Asthma   . Cancer ([place])   . Concussion    around age 61  . Depression   . Diabetes type 2, uncontrolled ([place])    new diagnosis in 08/2016  . Family history of breast cancer   . Family history of neurofibromatosis   . Family history of ovarian cancer   . HPV in female   . Hyperlipemia   . PTSD (post-traumatic stress disorder)     PAST SURGICAL HISTORY: Past Surgical History:  Procedure Laterality Date  . COLPOSCOPY    . left ovary removed  1978  . PORTACATH PLACEMENT Right 10/29/2016   Procedure: INSERTION PORT-A-CATH WITH [place];  Surgeon: [name], MD;  Location: [facility];  Service: General;  Laterality: Right;    FAMILY HISTORY Family History  Problem Relation Age of Onset  . Breast cancer Mother 11  . Hepatitis C Father   . Ovarian cancer Maternal Aunt        dx in her 51s  . Neurofibromatosis Maternal Uncle   . Brain cancer Maternal Uncle 58  . Lung cancer Maternal Grandfather   .  Kidney failure Paternal Grandmother   . Heart attack Paternal Grandfather   . Ovarian cancer Maternal Aunt        dx in her 54s  . Neurofibromatosis Maternal Aunt   . Ovarian cancer Maternal Aunt   . Neurofibromatosis Maternal Aunt   . Cervical cancer Maternal Aunt   . Neurofibromatosis Maternal Aunt   . Cancer Maternal Aunt        cancer on the bottom of her foot  . Breast cancer Other        MGF's sisters  .  Neurofibromatosis Other        MGM's paternal aunt  The patient's mother was diagnosed with breast cancer at age 70, but tells me the lump in her breast had been present for at least 10 years prior to that. She is now 76 and doing well. The patient's father died at the age of 28 from sepsis following liver transplantation. The patient has 2 brothers, no sisters. The patient tells me that she has at least 5 and sent cousins with breast cancer and there are other relatives with uterine cancer.  GYNECOLOGIC HISTORY:  No LMP recorded. Menarche age 8, first live birth age 63, the patient is GX P1. She has had multiple progesterone and oral contraceptive treatments through [facility] because of her menometrorrhagia. 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 Her husband Kelly Mendez is a truck Geophysicist/field seismologist. The patient's daughter Kelly Mendez is studying pre-veterinary medicine in college. Kelly Mendez has 3 children, all boys, a keen is a cook in [place] and lives independently. The 2 younger boys, Kelly Mendez and Kelly Mendez, aged 42 and 56 as of April 2018, are at home with the patient    ADVANCED DIRECTIVES: Not in place   HEALTH MAINTENANCE: Social History  Substance Use Topics  . Smoking status: Former Smoker    Packs/day: 0.15    Years: 25.00    Types: Cigarettes    Quit date: 11/25/2016  . Smokeless tobacco: Never Used  . Alcohol use 0.6 oz/week    1 Glasses of wine per week     Colonoscopy: n/a  PAP:  Bone density:   Allergies  Allergen Reactions  . Penicillins Other (See Comments)    As a child Has patient had a PCN reaction causing immediate rash, facial/tongue/throat swelling, SOB or lightheadedness with hypotension: unknown Has patient had a PCN reaction causing severe rash involving mucus membranes or skin necrosis: unknown Has patient had a PCN reaction that required hospitalization unknown Has patient had a PCN  reaction occurring within the last 10 years: no If all of the above answers are "NO", then may proceed with Cephalosporin use.     Current Outpatient Prescriptions  Medication Sig Dispense Refill  . atorvastatin (LIPITOR) 20 MG tablet TAKE 1 TABLET BY MOUTH ONCE DAILY 90 tablet 1  . cholestyramine (QUESTRAN) 4 GM/DOSE powder Take 1 packet (4 g total) by mouth 3 (three) times daily with meals. 378 g 12  . ferrous sulfate 325 (65 FE) MG tablet Take 1 tablet (325 mg total) by mouth 2 (two) times daily with a meal. 60 tablet 3  . Glucose Blood (BLOOD GLUCOSE TEST STRIPS) STRP Test twice a day. Pt uses one touch verio flex meter 100 each 5  . ibuprofen (ADVIL,MOTRIN) 200 MG tablet Take 400 mg by mouth daily as needed for headache.    . lidocaine-prilocaine (EMLA) cream Apply to affected area once  30 g 3  . magic mouthwash SOLN Take 5 mLs by mouth 4 (four) times daily. (Patient taking differently: Take 5 mLs by mouth 4 (four) times daily as needed for mouth pain. ) 240 mL 3  . Melatonin 10 MG TABS Take 10 mg by mouth at bedtime.    . metFORMIN (GLUCOPHAGE) 1000 MG tablet Take 1 tablet (1,000 mg total) by mouth 2 (two) times daily with a meal. 60 tablet 2  . ondansetron (ZOFRAN) 8 MG tablet Take 1 tablet (8 mg total) by mouth every 8 (eight) hours as needed for nausea or vomiting. 20 tablet 0  . ONETOUCH DELICA LANCETS FINE MISC Test twice a day 100 each 5  . prochlorperazine (COMPAZINE) 10 MG tablet Take 1 tablet (10 mg total) by mouth every 6 (six) hours as needed (Nausea or vomiting). 30 tablet 1  . rivaroxaban (XARELTO) 20 MG TABS tablet Take 1 tablet (20 mg total) by mouth daily with supper. 30 tablet 3   No current facility-administered medications for this visit.     OBJECTIVE:   Vitals:   02/12/17 0815  BP: (!) 150/91  Pulse: 83  Resp: 20  Temp: 98.4 F (36.9 C)     Body mass index is 43.82 kg/m.    ECOG FS:0 - Asymptomatic GENERAL: Patient is a well appearing female in no acute  distress HEENT:  Sclerae anicteric.  Visual fields are normal. Oropharynx clear and moist. No ulcerations or evidence of oropharyngeal candidiasis. Neck is supple.  NODES:  No cervical, supraclavicular, or axillary lymphadenopathy palpated.  BREAST EXAM:  Deferred. LUNGS:  Clear to auscultation bilaterally.  No wheezes or rhonchi. HEART:  Regular rate and rhythm. No murmur appreciated. ABDOMEN:  Soft, nontender.  Positive, normoactive bowel sounds. No organomegaly palpated. MSK:  No focal spinal tenderness to palpation. Full range of motion bilaterally in the upper extremities. EXTREMITIES:  No peripheral edema.   SKIN:  Clear with no obvious rashes or skin changes. No nail dyscrasia. NEURO:  Nonfocal. Well oriented.  Appropriate affect.  CN II-XII are normal.      LAB RESULTS:  CMP     Component Value Date/Time   NA 136 02/12/2017 0755   K 4.0 02/12/2017 0755   CL 107 12/18/2016 0500   CO2 23 02/12/2017 0755   GLUCOSE 208 (H) 02/12/2017 0755   BUN 11.7 02/12/2017 0755   CREATININE 0.8 02/12/2017 0755   CALCIUM 9.9 02/12/2017 0755   PROT 6.4 02/12/2017 0755   ALBUMIN 3.3 (L) 02/12/2017 0755   AST 17 02/12/2017 0755   ALT 21 02/12/2017 0755   ALKPHOS 112 02/12/2017 0755   BILITOT 0.41 02/12/2017 0755   GFRNONAA >60 12/18/2016 0500   GFRAA >60 12/18/2016 0500    No results found for: TOTALPROTELP, ALBUMINELP, A1GS, A2GS, BETS, BETA2SER, GAMS, MSPIKE, SPEI  No results found for: KPAFRELGTCHN, LAMBDASER, KAPLAMBRATIO  Lab Results  Component Value Date   WBC 13.0 (H) 02/12/2017   NEUTROABS 11.3 (H) 02/12/2017   HGB 10.5 (L) 02/12/2017   HCT 34.3 (L) 02/12/2017   MCV 85.3 02/12/2017   PLT 287 02/12/2017      Chemistry      Component Value Date/Time   NA 136 02/12/2017 0755   K 4.0 02/12/2017 0755   CL 107 12/18/2016 0500   CO2 23 02/12/2017 0755   BUN 11.7 02/12/2017 0755   CREATININE 0.8 02/12/2017 0755      Component Value Date/Time   CALCIUM 9.9 02/12/2017  0755  ALKPHOS 112 02/12/2017 0755   AST 17 02/12/2017 0755   ALT 21 02/12/2017 0755   BILITOT 0.41 02/12/2017 0755       No results found for: LABCA2  No components found for: [id number]  No results for input(s): INR in the last 168 hours.  Urinalysis    Component Value Date/Time   BILIRUBINUR n 09/29/2016 1045   PROTEINUR trace 09/29/2016 1045   UROBILINOGEN negative 09/29/2016 1045   NITRITE n 09/29/2016 1045   LEUKOCYTESUR Negative 09/29/2016 1045     STUDIES: No results found.  ELIGIBLE FOR AVAILABLE RESEARCH PROTOCOL: not a candidate for PREVENT  ASSESSMENT: 41 y.o.  Jack woman status post right breast upper inner quadrant biopsy 10/16/2016 for a clinical T1b pN0, stage IB invasive ductal carcinoma, grade 3, triple negative, with an MIB-1 of 80%.  (1)  genetics testing 12/01/2016 showed several variants of uncertain significance but no deleterious mutation  (2) neoadjuvant chemotherapy to consist of doxorubicin and cyclophosphamide in dose dense fashion 4 starting 11/05/2016, received third cycle 12/03/2016 then proceeded to weekly Abraxane 12 starting 01/01/2017  (a) cycle 4 of cyclophosphamide and doxorubicin to be given at the end of the Abraxane treatments  (b) cycle 5 of weekly Abraxane delayed because of incipient peripheral neuropathy  (3) breast conserving surgery to follow  (4) adjuvant radiation to follow chemotherapy     (5) extensive right upper extremity deep venous thrombosis documented 11/23/2016, treated initially with Lovenox  (a) transitioned to rivaroxaban as of 11/30/2016  (b) total 6 months anticoagulation planned (one month beyond port removal)  (6) tobacco abuse: The patient quit smoking 11/26/2016  (7) menometrorrhagia, with a cystic right adnexal lesion and a possible cervical polyp noted on ultrasound 12/16/2016, with benign endocervical curettage and endometrial biopsy 12/16/2016  (a) CA 125 12/17/2016 was 14.2 (normal).  (b)  adnexal mass, likely benign, will be explored at the completion of chemotherapy  (8) goserelin started 12/16/2016    PLAN: Jazman is doing well today.  Since her peripheral neuropathy is worse, she will not receive any further Abraxane.  She will undergo her final cycle of Doxorubicin and Cyclophosphamide today instead with Neulasta support with an injection on day 3.   I reviewed her labs with her today in detail.   I will order her breast MRI with and without contrast today.  She will return to clinic on 02/19/2017.    She knows to call for any questions or concerns prior to her next appointment here.  A total of (30) minutes of face-to-face time was spent with this patient with greater than 50% of that time in counseling and care-coordination.    Scot Dock, NP   02/12/2017 8:37 AM Medical Oncology and Hematology [facility] [address] [place], [place] [address] Tel. [phone]    Fax. [phone]

## 2017-02-12 NOTE — Progress Notes (Signed)
Blood return noted before, every 3 cc and after Adriamycin push.  

## 2017-02-12 NOTE — Addendum Note (Signed)
Addended by: Scot Dock on: 02/12/2017 02:29 PM   Modules accepted: Orders

## 2017-02-13 ENCOUNTER — Other Ambulatory Visit: Payer: Self-pay | Admitting: Oncology

## 2017-02-14 ENCOUNTER — Ambulatory Visit (HOSPITAL_BASED_OUTPATIENT_CLINIC_OR_DEPARTMENT_OTHER): Payer: Managed Care, Other (non HMO)

## 2017-02-14 VITALS — BP 143/90 | HR 92 | Temp 98.8°F | Resp 18

## 2017-02-14 DIAGNOSIS — C50211 Malignant neoplasm of upper-inner quadrant of right female breast: Secondary | ICD-10-CM | POA: Diagnosis not present

## 2017-02-14 DIAGNOSIS — Z5189 Encounter for other specified aftercare: Secondary | ICD-10-CM

## 2017-02-14 DIAGNOSIS — Z171 Estrogen receptor negative status [ER-]: Secondary | ICD-10-CM

## 2017-02-14 MED ORDER — PEGFILGRASTIM INJECTION 6 MG/0.6ML ~~LOC~~
6.0000 mg | PREFILLED_SYRINGE | Freq: Once | SUBCUTANEOUS | Status: AC
  Administered 2017-02-14: 6 mg via SUBCUTANEOUS

## 2017-02-14 NOTE — Patient Instructions (Signed)
Pegfilgrastim injection What is this medicine? PEGFILGRASTIM (PEG fil gra stim) is a long-acting granulocyte colony-stimulating factor that stimulates the growth of neutrophils, a type of white blood cell important in the body's fight against infection. It is used to reduce the incidence of fever and infection in patients with certain types of cancer who are receiving chemotherapy that affects the bone marrow, and to increase survival after being exposed to high doses of radiation. This medicine may be used for other purposes; ask your health care provider or pharmacist if you have questions. COMMON BRAND NAME(S): Neulasta What should I tell my health care provider before I take this medicine? They need to know if you have any of these conditions: -kidney disease -latex allergy -ongoing radiation therapy -sickle cell disease -skin reactions to acrylic adhesives (On-Body Injector only) -an unusual or allergic reaction to pegfilgrastim, filgrastim, other medicines, foods, dyes, or preservatives -pregnant or trying to get pregnant -breast-feeding How should I use this medicine? This medicine is for injection under the skin. If you get this medicine at home, you will be taught how to prepare and give the pre-filled syringe or how to use the On-body Injector. Refer to the patient Instructions for Use for detailed instructions. Use exactly as directed. Tell your healthcare provider immediately if you suspect that the On-body Injector may not have performed as intended or if you suspect the use of the On-body Injector resulted in a missed or partial dose. It is important that you put your used needles and syringes in a special sharps container. Do not put them in a trash can. If you do not have a sharps container, call your pharmacist or healthcare provider to get one. Talk to your pediatrician regarding the use of this medicine in children. While this drug may be prescribed for selected conditions,  precautions do apply. Overdosage: If you think you have taken too much of this medicine contact a poison control center or emergency room at once. NOTE: This medicine is only for you. Do not share this medicine with others. What if I miss a dose? It is important not to miss your dose. Call your doctor or health care professional if you miss your dose. If you miss a dose due to an On-body Injector failure or leakage, a new dose should be administered as soon as possible using a single prefilled syringe for manual use. What may interact with this medicine? Interactions have not been studied. Give your health care provider a list of all the medicines, herbs, non-prescription drugs, or dietary supplements you use. Also tell them if you smoke, drink alcohol, or use illegal drugs. Some items may interact with your medicine. This list may not describe all possible interactions. Give your health care provider a list of all the medicines, herbs, non-prescription drugs, or dietary supplements you use. Also tell them if you smoke, drink alcohol, or use illegal drugs. Some items may interact with your medicine. What should I watch for while using this medicine? You may need blood work done while you are taking this medicine. If you are going to need a MRI, CT scan, or other procedure, tell your doctor that you are using this medicine (On-Body Injector only). What side effects may I notice from receiving this medicine? Side effects that you should report to your doctor or health care professional as soon as possible: -allergic reactions like skin rash, itching or hives, swelling of the face, lips, or tongue -dizziness -fever -pain, redness, or irritation at site   where injected -pinpoint red spots on the skin -red or dark-brown urine -shortness of breath or breathing problems -stomach or side pain, or pain at the shoulder -swelling -tiredness -trouble passing urine or change in the amount of urine Side  effects that usually do not require medical attention (report to your doctor or health care professional if they continue or are bothersome): -bone pain -muscle pain This list may not describe all possible side effects. Call your doctor for medical advice about side effects. You may report side effects to FDA at 1-800-FDA-1088. Where should I keep my medicine? Keep out of the reach of children. Store pre-filled syringes in a refrigerator between 2 and 8 degrees C (36 and 46 degrees F). Do not freeze. Keep in carton to protect from light. Throw away this medicine if it is left out of the refrigerator for more than 48 hours. Throw away any unused medicine after the expiration date. NOTE: This sheet is a summary. It may not cover all possible information. If you have questions about this medicine, talk to your doctor, pharmacist, or health care provider.  2018 [employer] (2016-06-26 12:58:03)  

## 2017-02-17 ENCOUNTER — Telehealth: Payer: Self-pay | Admitting: Adult Health

## 2017-02-17 NOTE — Telephone Encounter (Signed)
Spoke with [employer] in peer to peer for [employer].  Her MRI was authorized and they will fax the authorization number to [place].     Wilber Bihari, NP

## 2017-02-19 ENCOUNTER — Other Ambulatory Visit (HOSPITAL_BASED_OUTPATIENT_CLINIC_OR_DEPARTMENT_OTHER): Payer: Managed Care, Other (non HMO)

## 2017-02-19 ENCOUNTER — Ambulatory Visit (HOSPITAL_BASED_OUTPATIENT_CLINIC_OR_DEPARTMENT_OTHER): Payer: Managed Care, Other (non HMO) | Admitting: Adult Health

## 2017-02-19 ENCOUNTER — Encounter: Payer: Self-pay | Admitting: Adult Health

## 2017-02-19 ENCOUNTER — Ambulatory Visit: Payer: Managed Care, Other (non HMO)

## 2017-02-19 VITALS — BP 132/89 | HR 82 | Temp 98.2°F | Resp 19 | Ht 74.0 in | Wt 333.7 lb

## 2017-02-19 DIAGNOSIS — G620 Drug-induced polyneuropathy: Secondary | ICD-10-CM

## 2017-02-19 DIAGNOSIS — Z171 Estrogen receptor negative status [ER-]: Secondary | ICD-10-CM | POA: Diagnosis not present

## 2017-02-19 DIAGNOSIS — C50211 Malignant neoplasm of upper-inner quadrant of right female breast: Secondary | ICD-10-CM

## 2017-02-19 DIAGNOSIS — I82621 Acute embolism and thrombosis of deep veins of right upper extremity: Secondary | ICD-10-CM | POA: Diagnosis not present

## 2017-02-19 DIAGNOSIS — H539 Unspecified visual disturbance: Secondary | ICD-10-CM

## 2017-02-19 LAB — CBC WITH DIFFERENTIAL/PLATELET
BASO%: 0.9 % (ref 0.0–2.0)
Basophils Absolute: 0 10*3/uL (ref 0.0–0.1)
EOS%: 3.6 % (ref 0.0–7.0)
Eosinophils Absolute: 0.1 10*3/uL (ref 0.0–0.5)
HCT: 35.6 % (ref 34.8–46.6)
HGB: 11.5 g/dL — ABNORMAL LOW (ref 11.6–15.9)
LYMPH%: 20.3 % (ref 14.0–49.7)
MCH: 26.5 pg (ref 25.1–34.0)
MCHC: 32.3 g/dL (ref 31.5–36.0)
MCV: 82.1 fL (ref 79.5–101.0)
MONO#: 0.1 10*3/uL (ref 0.1–0.9)
MONO%: 2.7 % (ref 0.0–14.0)
NEUT#: 2.1 10*3/uL (ref 1.5–6.5)
NEUT%: 72.5 % (ref 38.4–76.8)
Platelets: 210 10*3/uL (ref 145–400)
RBC: 4.34 10*6/uL (ref 3.70–5.45)
RDW: 18.3 % — ABNORMAL HIGH (ref 11.2–14.5)
WBC: 2.9 10*3/uL — ABNORMAL LOW (ref 3.9–10.3)
lymph#: 0.6 10*3/uL — ABNORMAL LOW (ref 0.9–3.3)

## 2017-02-19 LAB — COMPREHENSIVE METABOLIC PANEL
ALT: 18 U/L (ref 0–55)
AST: 13 U/L (ref 5–34)
Albumin: 3.4 g/dL — ABNORMAL LOW (ref 3.5–5.0)
Alkaline Phosphatase: 117 U/L (ref 40–150)
Anion Gap: 11 mEq/L (ref 3–11)
BUN: 8.1 mg/dL (ref 7.0–26.0)
CO2: 23 mEq/L (ref 22–29)
Calcium: 9.9 mg/dL (ref 8.4–10.4)
Chloride: 105 mEq/L (ref 98–109)
Creatinine: 0.8 mg/dL (ref 0.6–1.1)
EGFR: >90 mL/min/{1.73_m2} (ref >90)
Glucose: 174 mg/dl — ABNORMAL HIGH (ref 70–140)
Potassium: 4 mEq/L (ref 3.5–5.1)
Sodium: 138 mEq/L (ref 136–145)
Total Bilirubin: 0.33 mg/dL (ref 0.20–1.20)
Total Protein: 6.7 g/dL (ref 6.4–8.3)

## 2017-02-19 NOTE — Progress Notes (Signed)
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 This showed a possible mass in the right breast. On 10/13/2016 she underwent bilateral diagnostic mammography with tomography and right breast ultrasonography. This found the breast density to be category B. In the upper inner quadrant of the right breast there was a circumscribed mass which was barely palpable. Ultrasonography confirmed a 0.9 cm right breast mass at the 1:00 radiant 18 cm from the nipple ultrasound of the axilla showed 1 indeterminate right axillary lymph node with borderline thickening of the anterior cortex.  On 10/16/2016 the patient underwent biopsy of the right breast mass and the suspicious right axillary lymph node. The right breast mass proved to be an invasive ductal carcinoma, grade 3, estrogen and progesterone receptor negative, with an MIB-1 of 80%, and no HER-2 amplification, the signals ratio being 1.30 and the number per cell 1.75. The lymph node was negative and concordant.  Her subsequent history is as detailed below  INTERVAL HISTORY: Kelly Mendez is here for evaluation after receiving her fourth cycle of neoadjuvant chemotherapy with Doxorubicin and Cyclophosphamide with neulasta support.  She is doing well today.  She continues to have peripheral neuropathy that is intermittent in her hands.  Its happened twice  in the last week.  It is intermittent in her right foot and constant in the toes to the ball of her foot in her left foot.    Her vision is unchanged from last week in terms of the blurriness in her left eye peripheral vision.  She does have contacts.  She is seeing ophthalmology next week.    REVIEW OF SYSTEMS: Otherwise a detailed ROS was non contributory.     PAST MEDICAL HISTORY: Past Medical History:  Diagnosis Date  . Abnormal Pap smear of cervix   . Anxiety   . Asthma   . Cancer ([place])   . Concussion    around age 72  . Depression   . Diabetes type 2, uncontrolled ([place])    new diagnosis in 08/2016  . Family history of breast cancer   . Family history of neurofibromatosis   . Family history of ovarian cancer   . HPV in female   . Hyperlipemia   . PTSD (post-traumatic stress disorder)     PAST SURGICAL HISTORY: Past Surgical History:  Procedure Laterality Date  . COLPOSCOPY    . left ovary removed  1978  . PORTACATH PLACEMENT Right 10/29/2016   Procedure: INSERTION PORT-A-CATH WITH [place];  Surgeon: [name], MD;  Location: [facility];  Service: General;  Laterality: Right;    FAMILY HISTORY Family History  Problem Relation Age of Onset  . Breast cancer Mother 40  . Hepatitis C Father   . Ovarian cancer Maternal Aunt        dx in her 23s  . Neurofibromatosis Maternal Uncle   . Brain cancer Maternal Uncle 58  . Lung cancer Maternal Grandfather   . Kidney failure Paternal Grandmother   .  Heart attack Paternal Grandfather   . Ovarian cancer Maternal Aunt        dx in her 52s  . Neurofibromatosis Maternal Aunt   . Ovarian cancer Maternal Aunt   . Neurofibromatosis Maternal Aunt   . Cervical cancer Maternal Aunt   . Neurofibromatosis Maternal Aunt   . Cancer Maternal Aunt        cancer on the bottom of her foot  . Breast cancer Other        MGF's sisters  . Neurofibromatosis Other        MGM's paternal aunt  The patient's mother was diagnosed with breast  cancer at age 68, but tells me the lump in her breast had been present for at least 10 years prior to that. She is now 7 and doing well. The patient's father died at the age of 98 from sepsis following liver transplantation. The patient has 2 brothers, no sisters. The patient tells me that she has at least 5 and sent cousins with breast cancer and there are other relatives with uterine cancer.  GYNECOLOGIC HISTORY:  No LMP recorded. Menarche age 14, first live birth age 41, the patient is GX P1. She has had multiple progesterone and oral contraceptive treatments through [facility] because of her menometrorrhagia. 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 Her husband Kelly Mendez is a truck Geophysicist/field seismologist. The patient's daughter Kelly Mendez is studying pre-veterinary medicine in college. Kelly Mendez has 3 children, all boys, a keen is a cook in [place] and lives independently. The 2 younger boys, Kelly Mendez and Kelly Mendez, aged 22 and 69 as of April 2018, are at home with the patient    ADVANCED DIRECTIVES: Not in place   HEALTH MAINTENANCE: Social History  Substance Use Topics  . Smoking status: Former Smoker    Packs/day: 0.15    Years: 25.00    Types: Cigarettes    Quit date: 11/25/2016  . Smokeless tobacco: Never Used  . Alcohol use 0.6 oz/week    1 Glasses of wine per week     Colonoscopy: n/a  PAP:  Bone density:   Allergies  Allergen Reactions  . Penicillins Other (See Comments)    As a child Has patient had a PCN reaction causing immediate rash, facial/tongue/throat swelling, SOB or lightheadedness with hypotension: unknown Has patient had a PCN reaction causing severe rash involving mucus membranes or skin necrosis: unknown Has patient had a PCN reaction that required hospitalization unknown Has patient had a PCN reaction occurring within the last 10 years: no If all of the above answers are "NO", then may proceed  with Cephalosporin use.     Current Outpatient Prescriptions  Medication Sig Dispense Refill  . atorvastatin (LIPITOR) 20 MG tablet TAKE 1 TABLET BY MOUTH ONCE DAILY 90 tablet 1  . cholestyramine (QUESTRAN) 4 GM/DOSE powder Take 1 packet (4 g total) by mouth 3 (three) times daily with meals. 378 g 12  . ferrous sulfate 325 (65 FE) MG tablet Take 1 tablet (325 mg total) by mouth 2 (two) times daily with a meal. 60 tablet 3  . Glucose Blood (BLOOD GLUCOSE TEST STRIPS) STRP Test twice a day. Pt uses one touch verio flex meter 100 each 5  . ibuprofen (ADVIL,MOTRIN) 200 MG tablet Take 400 mg by mouth daily as needed for headache.    . magic mouthwash SOLN Take 5 mLs by mouth 4 (four) times daily. (Patient taking differently:  Take 5 mLs by mouth 4 (four) times daily as needed for mouth pain. ) 240 mL 3  . Melatonin 10 MG TABS Take 10 mg by mouth at bedtime.    . metFORMIN (GLUCOPHAGE) 1000 MG tablet Take 1 tablet (1,000 mg total) by mouth 2 (two) times daily with a meal. 60 tablet 2  . ondansetron (ZOFRAN) 8 MG tablet TAKE 1 TABLET BY MOUTH EVERY 8 HOURS AS NEEDED FOR NAUSEA AND VOMITING 20 tablet 0  . ONETOUCH DELICA LANCETS FINE MISC Test twice a day 100 each 5  . prochlorperazine (COMPAZINE) 10 MG tablet Take 1 tablet (10 mg total) by mouth every 6 (six) hours as needed (Nausea or vomiting). 30 tablet 1  . rivaroxaban (XARELTO) 20 MG TABS tablet Take 1 tablet (20 mg total) by mouth daily with supper. 30 tablet 3   No current facility-administered medications for this visit.     OBJECTIVE:   Vitals:   02/19/17 0843  BP: 132/89  Pulse: 82  Resp: 19  Temp: 98.2 F (36.8 C)     Body mass index is 42.84 kg/m.    ECOG FS:0 - Asymptomatic GENERAL: Patient is a well appearing female in no acute distress HEENT:  Sclerae anicteric. Oropharynx clear and moist. No ulcerations or evidence of oropharyngeal candidiasis. Neck is supple.  NODES:  No cervical, supraclavicular, or axillary  lymphadenopathy palpated.  BREAST EXAM:  Deferred. LUNGS:  Clear to auscultation bilaterally.  No wheezes or rhonchi. HEART:  Regular rate and rhythm. No murmur appreciated. ABDOMEN:  Soft, nontender.  Positive, normoactive bowel sounds. No organomegaly palpated. MSK:  No focal spinal tenderness to palpation. Full range of motion bilaterally in the upper extremities. EXTREMITIES:  No peripheral edema.   SKIN:  Clear with no obvious rashes or skin changes. No nail dyscrasia. NEURO:  Nonfocal. Well oriented.  Appropriate affect.       LAB RESULTS:  CMP     Component Value Date/Time   NA 136 02/12/2017 0755   K 4.0 02/12/2017 0755   CL 107 12/18/2016 0500   CO2 23 02/12/2017 0755   GLUCOSE 208 (H) 02/12/2017 0755   BUN 11.7 02/12/2017 0755   CREATININE 0.8 02/12/2017 0755   CALCIUM 9.9 02/12/2017 0755   PROT 6.4 02/12/2017 0755   ALBUMIN 3.3 (L) 02/12/2017 0755   AST 17 02/12/2017 0755   ALT 21 02/12/2017 0755   ALKPHOS 112 02/12/2017 0755   BILITOT 0.41 02/12/2017 0755   GFRNONAA >60 12/18/2016 0500   GFRAA >60 12/18/2016 0500    No results found for: TOTALPROTELP, ALBUMINELP, A1GS, A2GS, BETS, BETA2SER, GAMS, MSPIKE, SPEI  No results found for: Nils Pyle, [facility]  Lab Results  Component Value Date   WBC 2.9 (L) 02/19/2017   NEUTROABS 2.1 02/19/2017   HGB 11.5 (L) 02/19/2017   HCT 35.6 02/19/2017   MCV 82.1 02/19/2017   PLT 210 02/19/2017      Chemistry      Component Value Date/Time   NA 136 02/12/2017 0755   K 4.0 02/12/2017 0755   CL 107 12/18/2016 0500   CO2 23 02/12/2017 0755   BUN 11.7 02/12/2017 0755   CREATININE 0.8 02/12/2017 0755      Component Value Date/Time   CALCIUM 9.9 02/12/2017 0755   ALKPHOS 112 02/12/2017 0755   AST 17 02/12/2017 0755   ALT 21 02/12/2017 0755   BILITOT 0.41 02/12/2017 0755       No results found for: LABCA2  No components  found for: [id number]  No results for input(s): INR in the last 168  hours.  Urinalysis    Component Value Date/Time   BILIRUBINUR n 09/29/2016 1045   PROTEINUR trace 09/29/2016 1045   UROBILINOGEN negative 09/29/2016 1045   NITRITE n 09/29/2016 1045   LEUKOCYTESUR Negative 09/29/2016 1045     STUDIES: No results found.  ELIGIBLE FOR AVAILABLE RESEARCH PROTOCOL: not a candidate for PREVENT  ASSESSMENT: 41 y.o.  [place] woman status post right breast upper inner quadrant biopsy 10/16/2016 for a clinical T1b pN0, stage IB invasive ductal carcinoma, grade 3, triple negative, with an MIB-1 of 80%.  (1)  genetics testing 12/01/2016 showed several variants of uncertain significance but no deleterious mutation  (2) neoadjuvant chemotherapy to consist of doxorubicin and cyclophosphamide in dose dense fashion 4 starting 11/05/2016, received third cycle 12/03/2016 then proceeded to weekly Abraxane starting 01/01/2017  (a) cycle 4 of cyclophosphamide and doxorubicin given at end of Abraxane on 02/12/2017  (b) Abraxane given from 01/01/17-01/22/17 (4 cycles), stopped early due to peripheral neuropathy  (3) breast conserving surgery to follow  (4) adjuvant radiation to follow chemotherapy     (5) extensive right upper extremity deep venous thrombosis documented 11/23/2016, treated initially with Lovenox  (a) transitioned to rivaroxaban as of 11/30/2016  (b) total 6 months anticoagulation planned (one month beyond port removal)  (6) tobacco abuse: The patient quit smoking 11/26/2016  (7) menometrorrhagia, with a cystic right adnexal lesion and a possible cervical polyp noted on ultrasound 12/16/2016, with benign endocervical curettage and endometrial biopsy 12/16/2016  (a) CA 125 12/17/2016 was 14.2 (normal).  (b) adnexal mass, likely benign, will be explored at the completion of chemotherapy  (8) goserelin started 12/16/2016    PLAN:  Kelly Mendez is doing well today.  She tolerated her last cycle of Doxorubicin and Cyclophosphamide very well.  She has  completed neoadjuvant chemotherapy.  She continues to have peripheral neuropathy and hopefully that will get better with time.  She will see ophthalmology in one week for a vision evaluation.    She will undergo MRI breasts on 8/20, f/u with Dr. Brantley Stage and Dr. Jana Hakim on 8/24.    She knows to call for any questions or concerns prior to her next appointment here.  A total of (30) minutes of face-to-face time was spent with this patient with greater than 50% of that time in counseling and care-coordination.    Scot Dock, NP   02/19/2017 8:57 AM Medical Oncology and Hematology [facility] [address] [place],  [address] Tel. [phone]    Fax. [phone]

## 2017-02-26 ENCOUNTER — Other Ambulatory Visit: Payer: Managed Care, Other (non HMO)

## 2017-02-26 ENCOUNTER — Ambulatory Visit: Payer: Managed Care, Other (non HMO)

## 2017-03-02 ENCOUNTER — Ambulatory Visit
Admission: RE | Admit: 2017-03-02 | Discharge: 2017-03-02 | Disposition: A | Discharge status: Home / Self Care | Payer: Managed Care, Other (non HMO) | Source: Ambulatory Visit | Attending: Adult Health | Admitting: Adult Health

## 2017-03-02 DIAGNOSIS — Z171 Estrogen receptor negative status [ER-]: Secondary | ICD-10-CM

## 2017-03-02 DIAGNOSIS — C50211 Malignant neoplasm of upper-inner quadrant of right female breast: Secondary | ICD-10-CM

## 2017-03-02 MED ORDER — GADOBENATE DIMEGLUMINE 529 MG/ML IV SOLN
20.0000 mL | Freq: Once | INTRAVENOUS | Status: DC | PRN
Start: 2017-03-02 — End: 2017-03-03

## 2017-03-02 MED ORDER — GADOBENATE DIMEGLUMINE 529 MG/ML IV SOLN
20.0000 mL | Freq: Once | INTRAVENOUS | Status: AC | PRN
  Administered 2017-03-02: 20 mL via INTRAVENOUS

## 2017-03-05 ENCOUNTER — Ambulatory Visit: Payer: Managed Care, Other (non HMO)

## 2017-03-05 ENCOUNTER — Other Ambulatory Visit: Payer: Managed Care, Other (non HMO)

## 2017-03-05 ENCOUNTER — Ambulatory Visit: Payer: Managed Care, Other (non HMO) | Admitting: Oncology

## 2017-03-06 ENCOUNTER — Ambulatory Visit: Payer: Self-pay | Admitting: Surgery

## 2017-03-06 ENCOUNTER — Other Ambulatory Visit (HOSPITAL_BASED_OUTPATIENT_CLINIC_OR_DEPARTMENT_OTHER): Payer: Managed Care, Other (non HMO)

## 2017-03-06 ENCOUNTER — Other Ambulatory Visit: Payer: Self-pay | Admitting: Surgery

## 2017-03-06 ENCOUNTER — Other Ambulatory Visit: Payer: Self-pay | Admitting: *Deleted

## 2017-03-06 ENCOUNTER — Telehealth: Payer: Self-pay

## 2017-03-06 ENCOUNTER — Ambulatory Visit (HOSPITAL_BASED_OUTPATIENT_CLINIC_OR_DEPARTMENT_OTHER): Payer: Managed Care, Other (non HMO) | Admitting: Oncology

## 2017-03-06 VITALS — BP 123/85 | HR 89 | Temp 98.2°F | Resp 18 | Ht 74.0 in | Wt 337.9 lb

## 2017-03-06 DIAGNOSIS — C50211 Malignant neoplasm of upper-inner quadrant of right female breast: Secondary | ICD-10-CM | POA: Diagnosis not present

## 2017-03-06 DIAGNOSIS — C50411 Malignant neoplasm of upper-outer quadrant of right female breast: Secondary | ICD-10-CM

## 2017-03-06 DIAGNOSIS — R161 Splenomegaly, not elsewhere classified: Secondary | ICD-10-CM

## 2017-03-06 DIAGNOSIS — Z171 Estrogen receptor negative status [ER-]: Secondary | ICD-10-CM | POA: Diagnosis not present

## 2017-03-06 DIAGNOSIS — G620 Drug-induced polyneuropathy: Secondary | ICD-10-CM

## 2017-03-06 LAB — CBC WITH DIFFERENTIAL/PLATELET
BASO%: 1.2 % (ref 0.0–2.0)
Basophils Absolute: 0.1 10*3/uL (ref 0.0–0.1)
EOS%: 0.2 % (ref 0.0–7.0)
Eosinophils Absolute: 0 10*3/uL (ref 0.0–0.5)
HCT: 36.6 % (ref 34.8–46.6)
HGB: 11.8 g/dL (ref 11.6–15.9)
LYMPH%: 12.6 % — ABNORMAL LOW (ref 14.0–49.7)
MCH: 26.3 pg (ref 25.1–34.0)
MCHC: 32.2 g/dL (ref 31.5–36.0)
MCV: 81.7 fL (ref 79.5–101.0)
MONO#: 0.8 10*3/uL (ref 0.1–0.9)
MONO%: 8.7 % (ref 0.0–14.0)
NEUT#: 6.9 10*3/uL — ABNORMAL HIGH (ref 1.5–6.5)
NEUT%: 77.3 % — ABNORMAL HIGH (ref 38.4–76.8)
Platelets: 293 10*3/uL (ref 145–400)
RBC: 4.48 10*6/uL (ref 3.70–5.45)
RDW: 18.5 % — ABNORMAL HIGH (ref 11.2–14.5)
WBC: 8.9 10*3/uL (ref 3.9–10.3)
lymph#: 1.1 10*3/uL (ref 0.9–3.3)

## 2017-03-06 LAB — COMPREHENSIVE METABOLIC PANEL
ALT: 21 U/L (ref 0–55)
AST: 18 U/L (ref 5–34)
Albumin: 3.4 g/dL — ABNORMAL LOW (ref 3.5–5.0)
Alkaline Phosphatase: 97 U/L (ref 40–150)
Anion Gap: 9 mEq/L (ref 3–11)
BUN: 8.3 mg/dL (ref 7.0–26.0)
CO2: 23 mEq/L (ref 22–29)
Calcium: 9.6 mg/dL (ref 8.4–10.4)
Chloride: 105 mEq/L (ref 98–109)
Creatinine: 0.8 mg/dL (ref 0.6–1.1)
EGFR: 88 mL/min/{1.73_m2} — ABNORMAL LOW (ref >90)
Glucose: 205 mg/dl — ABNORMAL HIGH (ref 70–140)
Potassium: 4 mEq/L (ref 3.5–5.1)
Sodium: 137 mEq/L (ref 136–145)
Total Bilirubin: 0.33 mg/dL (ref 0.20–1.20)
Total Protein: 6.9 g/dL (ref 6.4–8.3)

## 2017-03-06 NOTE — Progress Notes (Signed)
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 This showed a possible mass in the right breast. On 10/13/2016 she underwent bilateral diagnostic mammography with tomography and right breast ultrasonography. This found the breast density to be category B. In the upper inner quadrant of the right breast there was a circumscribed mass which was barely palpable. Ultrasonography confirmed a 0.9 cm right breast mass at the 1:00 radiant 18 cm from the nipple ultrasound of the axilla showed 1 indeterminate right axillary lymph node with borderline thickening of the anterior cortex.  On 10/16/2016 the patient underwent biopsy of the right breast mass and the suspicious right axillary lymph node. The right breast mass proved to be an invasive ductal carcinoma, grade 3, estrogen and progesterone receptor negative, with an MIB-1 of 80%, and no HER-2 amplification, the signals ratio being 1.30 and the number per cell 1.75. The lymph node was negative and concordant.  Her subsequent history is as detailed below  INTERVAL HISTORY: Cella returns today for follow-up and treatment of her estrogen receptor negative breast cancer, accompanied by her mother. She completed her neoadjuvant chemotherapy and had a repeat breast MRI, which shows a significant response. She is scheduled for definitive surgery  01/15/2017. The plan is for lumpectomy and sentinel lymph node sampling. Her port will be removed at the same time  She continues on goserelin, with night sweats as her chief side effect. She has not had a period since her hospitalization and as a result her hemoglobin is rising.  REVIEW OF SYSTEMS: [name] is feeling more normal. 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 It is now grade 1. She is busy taking her children to sports practices and getting them ready for school next week. She denies unusual headaches, visual changes, nausea, vomiting, change in bowel or bladder habits, rash, fever, or bleeding problems. A detailed review of systems was otherwise stable   PAST MEDICAL HISTORY: Past Medical History:  Diagnosis Date  . Abnormal Pap smear of cervix   . Anxiety   . Asthma   . Cancer ([place])   . Concussion    around age 41  . Depression   . Diabetes type 2, uncontrolled ([place])    new diagnosis in 08/2016  . Family history of breast cancer   . Family history of neurofibromatosis   . Family history of ovarian cancer   . HPV in female   . Hyperlipemia   . PTSD (post-traumatic stress disorder)     PAST SURGICAL HISTORY: Past Surgical History:  Procedure Laterality Date  . COLPOSCOPY    . left ovary removed  1978  . PORTACATH PLACEMENT Right 10/29/2016   Procedure: INSERTION PORT-A-CATH WITH [place];  Surgeon: [name], MD;  Location: [facility];  Service: General;  Laterality: Right;    FAMILY HISTORY Family History  Problem Relation Age of Onset  . Breast cancer Mother 20  . Hepatitis C Father   .  Ovarian cancer Maternal Aunt        dx in her 60s  . Neurofibromatosis Maternal Uncle   . Brain cancer Maternal Uncle 58  . Lung cancer Maternal Grandfather   . Kidney failure Paternal Grandmother   . Heart attack Paternal Grandfather   . Ovarian cancer Maternal Aunt        dx in her 39s  . Neurofibromatosis Maternal Aunt   .  Ovarian cancer Maternal Aunt   . Neurofibromatosis Maternal Aunt   . Cervical cancer Maternal Aunt   . Neurofibromatosis Maternal Aunt   . Cancer Maternal Aunt        cancer on the bottom of her foot  . Breast cancer Other        MGF's sisters  . Neurofibromatosis Other        MGM's paternal aunt  The patient's mother was diagnosed with breast cancer at age 36, but tells me the lump in her breast had been present for at least 10 years prior to that. She is now 65 and doing well. The patient's father died at the age of 64 from sepsis following liver transplantation. The patient has 2 brothers, no sisters. The patient tells me that she has at least 5 and sent cousins with breast cancer and there are other relatives with uterine cancer.  GYNECOLOGIC HISTORY:  No LMP recorded. Menarche age 89, first live birth age 20, the patient is GX P1. She has had multiple progesterone and oral contraceptive treatments through [facility] because of her menometrorrhagia. 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 Her husband [name] is a truck Geophysicist/field seismologist. The patient's daughter [name] is studying pre-veterinary medicine in college. [name] has 3 children, all boys, a keen is a cook in [place] and lives independently. The 2 younger boys, [name] and [name], aged 77 and 35 as of April 2018, are at home with the patient    ADVANCED DIRECTIVES: Not in place   HEALTH MAINTENANCE: Social History  Substance Use Topics  . Smoking status: Former Smoker    Packs/day: 0.15    Years: 25.00    Types: Cigarettes    Quit date: 11/25/2016  . Smokeless tobacco: Never Used  . Alcohol use 0.6 oz/week    1 Glasses of wine per week     Colonoscopy: n/a  PAP:  Bone density:   Allergies  Allergen Reactions  . Penicillins Other (See Comments)    As a child Has patient had a PCN reaction causing immediate rash, facial/tongue/throat  swelling, SOB or lightheadedness with hypotension: unknown Has patient had a PCN reaction causing severe rash involving mucus membranes or skin necrosis: unknown Has patient had a PCN reaction that required hospitalization unknown Has patient had a PCN reaction occurring within the last 10 years: no If all of the above answers are "NO", then may proceed with Cephalosporin use.     Current Outpatient Prescriptions  Medication Sig Dispense Refill  . atorvastatin (LIPITOR) 20 MG tablet TAKE 1 TABLET BY MOUTH ONCE DAILY 90 tablet 1  . cholestyramine (QUESTRAN) 4 GM/DOSE powder Take 1 packet (4 g total) by mouth 3 (three) times daily with meals. 378 g 12  . ferrous sulfate 325 (65 FE) MG tablet Take 1 tablet (325 mg total) by mouth 2 (two) times daily with a meal. 60 tablet 3  . Glucose Blood (BLOOD GLUCOSE TEST STRIPS) STRP Test twice a day. Pt uses  one touch verio flex meter 100 each 5  . ibuprofen (ADVIL,MOTRIN) 200 MG tablet Take 400 mg by mouth daily as needed for headache.    . magic mouthwash SOLN Take 5 mLs by mouth 4 (four) times daily. (Patient taking differently: Take 5 mLs by mouth 4 (four) times daily as needed for mouth pain. ) 240 mL 3  . Melatonin 10 MG TABS Take 10 mg by mouth at bedtime.    . metFORMIN (GLUCOPHAGE) 1000 MG tablet Take 1 tablet (1,000 mg total) by mouth 2 (two) times daily with a meal. 60 tablet 2  . ondansetron (ZOFRAN) 8 MG tablet TAKE 1 TABLET BY MOUTH EVERY 8 HOURS AS NEEDED FOR NAUSEA AND VOMITING 20 tablet 0  . ONETOUCH DELICA LANCETS FINE MISC Test twice a day 100 each 5  . prochlorperazine (COMPAZINE) 10 MG tablet Take 1 tablet (10 mg total) by mouth every 6 (six) hours as needed (Nausea or vomiting). 30 tablet 1  . rivaroxaban (XARELTO) 20 MG TABS tablet Take 1 tablet (20 mg total) by mouth daily with supper. 30 tablet 3   No current facility-administered medications for this visit.     OBJECTIVE: Young-appearing African-American woman in no acute  distress  Vitals:   03/06/17 1155  BP: 123/85  Pulse: 89  Resp: 18  Temp: 98.2 F (36.8 C)  SpO2: 100%     Body mass index is 43.38 kg/m.    ECOG FS:1 - Symptomatic but completely ambulatory  Sclerae unicteric, pupils round and equal Oropharynx clear and moist No cervical or supraclavicular adenopathy Lungs no rales or rhonchi Heart regular rate and rhythm Abd soft, nontender, positive bowel sounds MSK no focal spinal tenderness, no upper extremity lymphedema Neuro: nonfocal, well oriented, appropriate affect Breasts: I do not palpate a mass in the right upper inner quadrant of the right breast; I do feel a Sheffler hard lump in the lower inner quadrant, not associated with any skin change or nipple retraction. The left breast is benign. Both axillae are benign.    LAB RESULTS:  CMP     Component Value Date/Time   NA 138 02/19/2017 0829   K 4.0 02/19/2017 0829   CL 107 12/18/2016 0500   CO2 23 02/19/2017 0829   GLUCOSE 174 (H) 02/19/2017 0829   BUN 8.1 02/19/2017 0829   CREATININE 0.8 02/19/2017 0829   CALCIUM 9.9 02/19/2017 0829   PROT 6.7 02/19/2017 0829   ALBUMIN 3.4 (L) 02/19/2017 0829   AST 13 02/19/2017 0829   ALT 18 02/19/2017 0829   ALKPHOS 117 02/19/2017 0829   BILITOT 0.33 02/19/2017 0829   GFRNONAA >60 12/18/2016 0500   GFRAA >60 12/18/2016 0500    No results found for: TOTALPROTELP, ALBUMINELP, A1GS, A2GS, BETS, BETA2SER, GAMS, MSPIKE, SPEI  No results found for: Nils Pyle, [facility]  Lab Results  Component Value Date   WBC 8.9 03/06/2017   NEUTROABS 6.9 (H) 03/06/2017   HGB 11.8 03/06/2017   HCT 36.6 03/06/2017   MCV 81.7 03/06/2017   PLT 293 03/06/2017      Chemistry      Component Value Date/Time   NA 138 02/19/2017 0829   K 4.0 02/19/2017 0829   CL 107 12/18/2016 0500   CO2 23 02/19/2017 0829   BUN 8.1 02/19/2017 0829   CREATININE 0.8 02/19/2017 0829      Component Value Date/Time   CALCIUM 9.9 02/19/2017 0829    ALKPHOS 117 02/19/2017 0829   AST 13 02/19/2017 0829  ALT 18 02/19/2017 0829   BILITOT 0.33 02/19/2017 0829       No results found for: LABCA2  No components found for: CZYSAY301  No results for input(s): INR in the last 168 hours.  Urinalysis    Component Value Date/Time   BILIRUBINUR n 09/29/2016 1045   PROTEINUR trace 09/29/2016 1045   UROBILINOGEN negative 09/29/2016 1045   NITRITE n 09/29/2016 1045   LEUKOCYTESUR Negative 09/29/2016 1045     STUDIES: Mr Breast Bilateral W Wo Contrast  Result Date: 03/03/2017 CLINICAL DATA:  Patient with history right breast carcinoma. Patient status post neoadjuvant chemotherapy. Follow-up evaluation. EXAM: BILATERAL BREAST MRI WITH AND WITHOUT CONTRAST TECHNIQUE: Multiplanar, multisequence MR images of both breasts were obtained prior to and following the intravenous administration of 20 ml of MultiHance. THREE-DIMENSIONAL MR IMAGE RENDERING ON INDEPENDENT WORKSTATION: Three-dimensional MR images were rendered by post-processing of the original MR data on an independent workstation. The three-dimensional MR images were interpreted, and findings are reported in the following complete MRI report for this study. Three dimensional images were evaluated at the independent DynaCad workstation. COMPARISON:  Previous exam(s). FINDINGS: Breast composition: b. Scattered fibroglandular tissue. Background parenchymal enhancement: Minimal Right breast: Mass within the upper inner right breast has decreased in size when compared to prior exam measuring 1.0 x 1.0 x 0.5 cm, previously measuring 1.2 x 1.5 x 1.2 cm when measured at the same dimension/level. Left breast: No mass or abnormal enhancement. Lymph nodes: No abnormal appearing lymph nodes. Ancillary findings:  Right anterior chest wall Port-A-Cath. IMPRESSION: Interval decrease in size of biopsy-proven right breast carcinoma. RECOMMENDATION: Treatment plan for known right breast malignancy. BI-RADS  CATEGORY  6: Known biopsy-proven malignancy. Electronically Signed   By: Lovey Newcomer M.D.   On: 03/03/2017 11:01    ELIGIBLE FOR AVAILABLE RESEARCH PROTOCOL: not a candidate for PREVENT  ASSESSMENT: 41 y.o.  [place] woman status post right breast upper inner quadrant biopsy 10/16/2016 for a clinical T1b pN0, stage IB invasive ductal carcinoma, grade 3, triple negative, with an MIB-1 of 80%.  (1)  genetics testing 12/01/2016 showed several variants of uncertain significance but no deleterious mutation  (2) neoadjuvant chemotherapy to consist of doxorubicin and cyclophosphamide in dose dense fashion 4 starting 11/05/2016, received third cycle 12/03/2016 then proceeded to weekly Abraxane starting 01/01/2017  (a) cycle 4 of cyclophosphamide and doxorubicin given at end of Abraxane on 02/12/2017  (b) Abraxane given from 01/01/17-01/22/17 (4 cycles), stopped early due to peripheral neuropathy  (3) breast conserving surgery to follow  (4) adjuvant radiation to follow chemotherapy     (5) extensive right upper extremity deep venous thrombosis documented 11/23/2016, treated initially with Lovenox  (a) transitioned to rivaroxaban as of 11/30/2016  (b) total 6 months anticoagulation planned (one month beyond port removal)  (6) tobacco abuse: The patient quit smoking 11/26/2016  (7) menometrorrhagia, with a cystic right adnexal lesion and a possible cervical polyp noted on ultrasound 12/16/2016, with benign endocervical curettage and endometrial biopsy 12/16/2016  (a) CA 125 12/17/2016 was 14.2 (normal).  (b) adnexal mass, likely benign, will be explored at the completion of chemotherapy  (c) goserelin started 12/16/2016    PLAN: Asna has completed her neoadjuvant chemotherapy and is ready for surgery, scheduled for the first week in September. She will have her port removed at the same time.  Today we reviewed her repeat brain MRI in detail. It shows a significant with no not complete  response.  She understands she will need adjuvant radiation  after surgery. Depending on the residual tumor burden we will consider whether or not she would benefit from sensitizing capecitabine.  We are continuing the goserelin every 28 days as long as she finds it useful. Certainly at this point it has controlled her bleeding and is serving as a form of contraception. She is only having very minimal menopausal symptoms.  She will see Dr. Isidore Moos the third week in September to start planning her radiation treatments. She will see me again late October. She knows to call for any problems that may develop before that visit.   Chauncey Cruel, MD   03/06/2017 12:02 PM Medical Oncology and Hematology [facility] [place] [address] [place], [place] [address] Tel. [phone]    Fax. [phone]

## 2017-03-06 NOTE — H&P (Signed)
Kelly Mendez 03/06/2017 9:48 AM Location: [facility] Patient #: [id number] DOB: 05/26/1976 Married / Language: Kelly Mendez / Race: White Female  History of Present Illness Kelly Moores A. Zyere Jiminez MD; 03/06/2017 10:12 AM) Patient words: Patient returns for a follow-up of her stage I right breast cancer. She received neoadjuvant chemotherapy and she underwent genetics. She's had modest reduction in size over right breast tumor. She is ready to proceed with lumpectomy. Today she developed a right upper straight DVT and is on anticoagulation. Overall, she is recovering well from chemotherapy but she did have issues during chemotherapy and this had to be stopped. I reviewed her MRI which shows modest reduction in the stage I right breast cancer. She has no adenopathy. Left breast is otherwise unremarkable. Oncology:                      41 y.o. Kelly Mendez woman status post right breast upper inner quadrant biopsy 10/16/2016 for a clinical T1b pN0, stage IB invasive ductal carcinoma, grade 3, triple negative, with an MIB-1 of 80%.  (1) genetics testing 12/01/2016 showed several variants of uncertain significance but no deleterious mutation  (2) neoadjuvant chemotherapy to consist of doxorubicin and cyclophosphamide in dose dense fashion 4 starting 11/05/2016, received third cycle 12/03/2016 then proceeded to weekly Abraxane starting 01/01/2017 (a) cycle 4 of cyclophosphamide and doxorubicin given at end of Abraxane on 02/12/2017 (b) Abraxane given from 01/01/17-01/22/17 (4 cycles), stopped early due to peripheral neuropathy  (3) breast conserving surgery to follow  (4) adjuvant radiation to follow chemotherapy   (5) extensive right upper extremity deep venous thrombosis documented 11/23/2016, treated initially with Lovenox (a) transitioned to rivaroxaban as of 11/30/2016 (b) total 6 months anticoagulation planned (one month  beyond port removal)  (6) tobacco abuse: The patient quit smoking 11/26/2016  (7) menometrorrhagia, with a cystic right adnexal lesion and a possible cervical polyp noted on ultrasound 12/16/2016, with benign endocervical curettage and endometrial biopsy 12/16/2016 (a) CA 125 12/17/2016 was 14.2 (normal). (b) adnexal mass, likely benign, will be explored at the completion of chemotherapy  (8) goserelin started 12/16/2016  ADDITIONAL INFORMATION: 1. PROGNOSTIC INDICATORS Results: IMMUNOHISTOCHEMICAL AND MORPHOMETRIC ANALYSIS PERFORMED MANUALLY Estrogen Receptor: 0%, NEGATIVE Progesterone Receptor: 0%, NEGATIVE Proliferation Marker Ki67: 80% COMMENT: The negative hormone receptor study(ies) in this case has No internal positive control. REFERENCE RANGE ESTROGEN RECEPTOR NEGATIVE 0% POSITIVE =>1% REFERENCE RANGE PROGESTERONE RECEPTOR NEGATIVE 0% POSITIVE =>1% All controls stained appropriately Kelly [name] Pathologist, Electronic Signature ( Signed 10/21/2016) 1. FLUORESCENCE IN-SITU HYBRIDIZATION Results: HER2 - NEGATIVE RATIO OF HER2/CEP17 SIGNALS 1.30 AVERAGE HER2 COPY NUMBER PER CELL 1.75 1 of 3 FINAL for Kelly Mendez ([id number]) ADDITIONAL INFORMATION:(continued) Reference Range: NEGATIVE HER2/CEP17 Ratio <2.0 and average HER2 copy number <4.0 EQUIVOCAL HER2/CEP17 Ratio <2.0 and average HER2 copy number >=4.0 and <6.0 POSITIVE HER2/CEP17 Ratio >=2.0 or <2.0 and average HER2 copy number >=6.0 Kelly [name] Pathologist, Electronic Signature ( Signed 10/22/2016) FINAL DIAGNOSIS Diagnosis 1. Breast, right, needle core biopsy, 1:00 o'clock - INVASIVE DUCTAL CARCINOMA. - SEE COMMENT. 2. Lymph node, needle/core biopsy, right axilla - THERE IS NO EVIDENCE OF CARCINOMA IN 1 OF 1 LYMPH NODE (0/1). Microscopic Comment 1. The carcinoma appears grade 3. A breast prognostic profile will be performed and results reported separately. The results are  called to [name] on 10/17/2016. (JBK:ah 10/17/16) Kelly [name] Pathologist, Electronic Signature (Case signed 10/17/2016       ADDITIONAL INFORMATION: 1. PROGNOSTIC INDICATORS Results:  IMMUNOHISTOCHEMICAL AND MORPHOMETRIC ANALYSIS PERFORMED MANUALLY Estrogen Receptor: 0%, NEGATIVE Progesterone Receptor: 0%, NEGATIVE Proliferation Marker Ki67: 80% COMMENT: The negative hormone receptor study(ies) in this case has No internal positive control. REFERENCE RANGE ESTROGEN RECEPTOR NEGATIVE 0% POSITIVE =>1% REFERENCE RANGE PROGESTERONE RECEPTOR NEGATIVE 0% POSITIVE =>1% All controls stained appropriately Kelly [name] Pathologist, Electronic Signature ( Signed 10/21/2016) 1. FLUORESCENCE IN-SITU HYBRIDIZATION Results: HER2 - NEGATIVE RATIO OF HER2/CEP17 SIGNALS 1.30 AVERAGE HER2 COPY NUMBER PER CELL 1.75 1 of 3 FINAL for Kelly Mendez ([id number]) ADDITIONAL INFORMATION:(continued) Reference Range: NEGATIVE HER2/CEP17 Ratio <2.0 and average HER2 copy number <4.0 EQUIVOCAL HER2/CEP17 Ratio <2.0 and average HER2 copy number >=4.0 and <6.0 POSITIVE HER2/CEP17 Ratio >=2.0 or <2.0 and average HER2 copy number >=6.0 Kelly [name] Pathologist, Electronic Signature ( Signed 10/22/2016) FINAL DIAGNOSIS Diagnosis 1. Breast, right, needle core biopsy, 1:00 o'clock - INVASIVE DUCTAL CARCINOMA. - SEE COMMENT. 2. Lymph node, needle/core biopsy, right axilla - THERE IS NO EVIDENCE OF CARCINOMA IN 1 OF 1 LYMPH NODE (0/1). Microscopic Comment 1. The carcinoma appears grade 3. A breast prognostic profile will be performed and results reported separately. The results are called to [name] on 10/17/2016. (JBK:ah 10/17/16) Kelly [name] Pathologist, Electronic Signature (Case signed 10/17/2016            CLINICAL DATA: Patient with history right breast carcinoma. Patient status post neoadjuvant chemotherapy. Follow-up  evaluation.  EXAM: BILATERAL BREAST MRI WITH AND WITHOUT CONTRAST  TECHNIQUE: Multiplanar, multisequence MR images of both breasts were obtained prior to and following the intravenous administration of 20 ml of MultiHance.  THREE-DIMENSIONAL MR IMAGE RENDERING ON INDEPENDENT WORKSTATION:  Three-dimensional MR images were rendered by post-processing of the original MR data on an independent workstation. The three-dimensional MR images were interpreted, and findings are reported in the following complete MRI report for this study. Three dimensional images were evaluated at the independent DynaCad workstation.  COMPARISON: Previous exam(s).  FINDINGS: Breast composition: b. Scattered fibroglandular tissue.  Background parenchymal enhancement: Minimal  Right breast: Mass within the upper inner right breast has decreased in size when compared to prior exam measuring 1.0 x 1.0 x 0.5 cm, previously measuring 1.2 x 1.5 x 1.2 cm when measured at the same dimension/level.  Left breast: No mass or abnormal enhancement.  Lymph nodes: No abnormal appearing lymph nodes.  Ancillary findings: Right anterior chest wall Port-A-Cath.  IMPRESSION: Interval decrease in size of biopsy-proven right breast carcinoma.  RECOMMENDATION: Treatment plan for known right breast malignancy.  BI-RADS CATEGORY 6: Known biopsy-proven malignancy.   Electronically Signed By: Lovey Newcomer M.D. On: 03/03/2017 11:01.  The patient is a 41 year old female.   Allergies (Tanisha A. Owens Shark, [place]; 03/06/2017 9:49 AM) Penicillin G Benzathine & Proc *PENICILLINS* Allergies Reconciled  Medication History (Tanisha A. Owens Shark, [place]; 03/06/2017 9:50 AM) Rivaroxaban (20MG Tablet, Oral) Active. Atorvastatin Calcium (20MG Tablet, Oral) Active. MetFORMIN HCl (1000MG Tablet, Oral) Active. MetFORMIN HCl (500MG Tablet, Oral) Active. OneTouch Delica Lancets 97W Active. OneTouch Verio (In Vitro)  Active. Ibuprofen (200MG Tablet, Oral) Active. Medications Reconciled    Vitals (Tanisha A. Brown RMA; 03/06/2017 9:49 AM) 03/06/2017 9:49 AM Weight: 336.8 lb Height: 74in Body Surface Area: 2.71 m Body Mass Index: 43.24 kg/m  Temp.: 97.41F  Pulse: 69 (Regular)  BP: 134/82 (Sitting, Left Arm, Standard)      Physical Exam (Kimberleigh Mehan A. Chayse Gracey MD; 03/06/2017 10:13 AM)  General Mental Status-Alert. General Appearance-Consistent with stated age. Hydration-Well hydrated. Voice-Normal.  Head and Neck  Head-normocephalic, atraumatic with no lesions or palpable masses. Trachea-midline. Thyroid Gland Characteristics - normal size and consistency.  Eye Eyeball - Bilateral-Extraocular movements intact. Sclera/Conjunctiva - Bilateral-No scleral icterus.  Chest and Lung Exam Chest and lung exam reveals -quiet, even and easy respiratory effort with no use of accessory muscles and on auscultation, normal breath sounds, no adventitious sounds and normal vocal resonance. Inspection Chest Wall - Normal. Back - normal.  Breast Note: Right breast is without mass lesion. There is no right axillary adenopathy. Left breast is normal. Right upper chest noted. No significant right arm edema.  Cardiovascular Cardiovascular examination reveals -normal heart sounds, regular rate and rhythm with no murmurs and normal pedal pulses bilaterally.  Neurologic Neurologic evaluation reveals -alert and oriented x 3 with no impairment of recent or remote memory. Mental Status-Normal.  Musculoskeletal Normal Exam - Left-Upper Extremity Strength Normal and Lower Extremity Strength Normal. Normal Exam - Right-Upper Extremity Strength Normal and Lower Extremity Strength Normal.  Lymphatic Head & Neck  General Head & Neck Lymphatics: Bilateral - Description - Normal. Axillary  General Axillary Region: Bilateral - Description - Normal. Tenderness - Non  Tender.    Assessment & Plan (Sorren Vallier A. Dayna Alia MD; 03/06/2017 10:13 AM)  BREAST CANCER, RIGHT (C50.911) Impression: Patient ready for right breast lumpectomy with sentinel mapping. Her Port-A-Cath will be removed as well. Risk of lumpectomy include bleeding, infection, seroma, more surgery, use of seed/wire, wound care, cosmetic deformity and the need for other treatments, death , blood clots, death. Pt agrees to proceed. Risk of sentinel lymph node mapping include bleeding, infection, lymphedema, shoulder pain. stiffness, dye allergy. cosmetic deformity , blood clots, death, need for more surgery. Pt agres to proceed.  Current Plans You are being scheduled for surgery- Our schedulers will call you.  You should hear from our office's scheduling department within 5 working days about the location, date, and time of surgery. We try to make accommodations for patient's preferences in scheduling surgery, but sometimes the OR schedule or the surgeon's schedule prevents [place] from making those accommodations.  If you have not heard from our office [phone]) in 5 working days, call the office and ask for your surgeon's nurse.  If you have other questions about your diagnosis, plan, or surgery, call the office and ask for your surgeon's nurse.  Pt Education - flb breast cancer surgery: discussed with patient and provided information. We discussed the staging and pathophysiology of breast cancer. We discussed all of the different options for treatment for breast cancer including surgery, chemotherapy, radiation therapy, Herceptin, and antiestrogen therapy. We discussed a sentinel lymph node biopsy as she does not appear to having lymph node involvement right now. We discussed the performance of that with injection of radioactive tracer and blue dye. We discussed that she would have an incision underneath her axillary hairline. We discussed that there is a bout a 10-20% chance of having a positive  node with a sentinel lymph node biopsy and we will await the permanent pathology to make any other first further decisions in terms of her treatment. One of these options might be to return to the operating room to perform an axillary lymph node dissection. We discussed about a 1-2% risk lifetime of chronic shoulder pain as well as lymphedema associated with a sentinel lymph node biopsy. We discussed the options for treatment of the breast cancer which included lumpectomy versus a mastectomy. We discussed the performance of the lumpectomy with a wire placement. We discussed a 10-20% chance  of a positive margin requiring reexcision in the operating room. We also discussed that she may need radiation therapy or antiestrogen therapy or both if she undergoes lumpectomy. We discussed the mastectomy and the postoperative care for that as well. We discussed that there is no difference in her survival whether she undergoes lumpectomy with radiation therapy or antiestrogen therapy versus a mastectomy. There is a slight difference in the local recurrence rate being 3-5% with lumpectomy and about 1% with a mastectomy. We discussed the risks of operation including bleeding, infection, possible reoperation. She understands her further therapy will be based on what her stages at the time of her operation.  Pt Education - ABC (After Breast Cancer) Class Info: discussed with patient and provided information. I recommended surgery to remove the catheter. I explained the technique of removal with use of local anesthesia & possible need for more aggressive sedation/anesthesia for patient comfort.  Risks such as bleeding, infection, and other risks were discussed. Post-operative dressing/incision care was discussed. I noted a good likelihood this will help address the problem. We will work to minimize complications. Questions were answered. The patient expresses understanding & wishes to proceed with surgery.

## 2017-03-06 NOTE — Telephone Encounter (Signed)
Los not complete at checkout,patient aware and will ck mychart for all appts,staff message to dr Jana Hakim to place a ref for dr Letitia Neri

## 2017-03-09 ENCOUNTER — Other Ambulatory Visit: Payer: Self-pay | Admitting: Oncology

## 2017-03-09 ENCOUNTER — Encounter: Payer: Self-pay | Admitting: Radiation Oncology

## 2017-03-09 DIAGNOSIS — Z171 Estrogen receptor negative status [ER-]: Secondary | ICD-10-CM

## 2017-03-09 DIAGNOSIS — C50211 Malignant neoplasm of upper-inner quadrant of right female breast: Secondary | ICD-10-CM

## 2017-03-11 ENCOUNTER — Encounter (HOSPITAL_BASED_OUTPATIENT_CLINIC_OR_DEPARTMENT_OTHER): Payer: Self-pay | Admitting: *Deleted

## 2017-03-11 ENCOUNTER — Telehealth: Payer: Self-pay | Admitting: Adult Health

## 2017-03-11 ENCOUNTER — Other Ambulatory Visit: Payer: Self-pay | Admitting: Family Medicine

## 2017-03-11 DIAGNOSIS — E119 Type 2 diabetes mellitus without complications: Secondary | ICD-10-CM

## 2017-03-11 NOTE — Telephone Encounter (Signed)
Called patient and LMOM that I needed to talk to her, and requested she call back before 130 today, or tomorrow.    I did not leave this on her machine, however I wanted to discuss with her the potential eligibility she has for SWOG 418 trial for her breast cancer and what that means in regards to her port.    Wilber Bihari, NP

## 2017-03-12 ENCOUNTER — Other Ambulatory Visit: Payer: Managed Care, Other (non HMO)

## 2017-03-12 ENCOUNTER — Ambulatory Visit (HOSPITAL_BASED_OUTPATIENT_CLINIC_OR_DEPARTMENT_OTHER): Payer: Managed Care, Other (non HMO)

## 2017-03-12 ENCOUNTER — Ambulatory Visit: Payer: Managed Care, Other (non HMO)

## 2017-03-12 ENCOUNTER — Other Ambulatory Visit: Payer: Self-pay | Admitting: Hematology and Oncology

## 2017-03-12 VITALS — BP 146/86 | HR 96 | Resp 18

## 2017-03-12 DIAGNOSIS — C50211 Malignant neoplasm of upper-inner quadrant of right female breast: Secondary | ICD-10-CM

## 2017-03-12 DIAGNOSIS — Z5111 Encounter for antineoplastic chemotherapy: Secondary | ICD-10-CM

## 2017-03-12 DIAGNOSIS — N9489 Other specified conditions associated with female genital organs and menstrual cycle: Secondary | ICD-10-CM

## 2017-03-12 DIAGNOSIS — N920 Excessive and frequent menstruation with regular cycle: Secondary | ICD-10-CM

## 2017-03-12 MED ORDER — GOSERELIN ACETATE 3.6 MG ~~LOC~~ IMPL
3.6000 mg | DRUG_IMPLANT | Freq: Once | SUBCUTANEOUS | Status: AC
  Administered 2017-03-12: 3.6 mg via SUBCUTANEOUS
  Filled 2017-03-12: qty 3.6

## 2017-03-17 ENCOUNTER — Ambulatory Visit
Admission: RE | Admit: 2017-03-17 | Discharge: 2017-03-17 | Disposition: A | Discharge status: Home / Self Care | Payer: Managed Care, Other (non HMO) | Source: Ambulatory Visit | Attending: Surgery | Admitting: Surgery

## 2017-03-17 ENCOUNTER — Encounter (HOSPITAL_BASED_OUTPATIENT_CLINIC_OR_DEPARTMENT_OTHER): Payer: Self-pay | Admitting: *Deleted

## 2017-03-17 ENCOUNTER — Ambulatory Visit (HOSPITAL_COMMUNITY): Admission: RE | Admit: 2017-03-17 | Payer: Managed Care, Other (non HMO) | Source: Ambulatory Visit

## 2017-03-17 DIAGNOSIS — Z171 Estrogen receptor negative status [ER-]: Secondary | ICD-10-CM

## 2017-03-17 DIAGNOSIS — C50411 Malignant neoplasm of upper-outer quadrant of right female breast: Secondary | ICD-10-CM

## 2017-03-17 NOTE — Progress Notes (Addendum)
Ensure Pre-surgery drink given with instructions to complete by 0600 dos, hibiclens soap given with instructions, pt verbalized understanding.   LMP- May 2018.  Pt states is on Goserelin- she has no periods. Husband had vasectomy.

## 2017-03-18 ENCOUNTER — Encounter (HOSPITAL_BASED_OUTPATIENT_CLINIC_OR_DEPARTMENT_OTHER)
Admission: RE | Disposition: A | Discharge status: Home / Self Care | Payer: Self-pay | Source: Ambulatory Visit | Attending: Surgery

## 2017-03-18 ENCOUNTER — Encounter (HOSPITAL_BASED_OUTPATIENT_CLINIC_OR_DEPARTMENT_OTHER): Payer: Self-pay

## 2017-03-18 ENCOUNTER — Ambulatory Visit (HOSPITAL_BASED_OUTPATIENT_CLINIC_OR_DEPARTMENT_OTHER): Payer: Managed Care, Other (non HMO) | Admitting: Certified Registered"

## 2017-03-18 ENCOUNTER — Ambulatory Visit (HOSPITAL_BASED_OUTPATIENT_CLINIC_OR_DEPARTMENT_OTHER)
Admission: RE | Admit: 2017-03-18 | Discharge: 2017-03-18 | Disposition: A | Discharge status: Home / Self Care | Payer: Managed Care, Other (non HMO) | Source: Ambulatory Visit | Attending: Surgery | Admitting: Surgery

## 2017-03-18 ENCOUNTER — Ambulatory Visit (HOSPITAL_COMMUNITY)
Admission: RE | Admit: 2017-03-18 | Discharge: 2017-03-18 | Disposition: A | Discharge status: Home / Self Care | Payer: Managed Care, Other (non HMO) | Source: Ambulatory Visit | Attending: Surgery | Admitting: Surgery

## 2017-03-18 ENCOUNTER — Ambulatory Visit
Admission: RE | Admit: 2017-03-18 | Discharge: 2017-03-18 | Disposition: A | Discharge status: Home / Self Care | Payer: Managed Care, Other (non HMO) | Source: Ambulatory Visit | Attending: Surgery | Admitting: Surgery

## 2017-03-18 DIAGNOSIS — Z6841 Body Mass Index (BMI) 40.0 and over, adult: Secondary | ICD-10-CM | POA: Insufficient documentation

## 2017-03-18 DIAGNOSIS — Z8041 Family history of malignant neoplasm of ovary: Secondary | ICD-10-CM | POA: Insufficient documentation

## 2017-03-18 DIAGNOSIS — D649 Anemia, unspecified: Secondary | ICD-10-CM | POA: Diagnosis not present

## 2017-03-18 DIAGNOSIS — D0511 Intraductal carcinoma in situ of right breast: Secondary | ICD-10-CM | POA: Insufficient documentation

## 2017-03-18 DIAGNOSIS — Z7984 Long term (current) use of oral hypoglycemic drugs: Secondary | ICD-10-CM | POA: Diagnosis not present

## 2017-03-18 DIAGNOSIS — F17210 Nicotine dependence, cigarettes, uncomplicated: Secondary | ICD-10-CM | POA: Insufficient documentation

## 2017-03-18 DIAGNOSIS — E1151 Type 2 diabetes mellitus with diabetic peripheral angiopathy without gangrene: Secondary | ICD-10-CM | POA: Diagnosis not present

## 2017-03-18 DIAGNOSIS — E785 Hyperlipidemia, unspecified: Secondary | ICD-10-CM | POA: Diagnosis not present

## 2017-03-18 DIAGNOSIS — C50911 Malignant neoplasm of unspecified site of right female breast: Secondary | ICD-10-CM | POA: Diagnosis present

## 2017-03-18 DIAGNOSIS — F4310 Post-traumatic stress disorder, unspecified: Secondary | ICD-10-CM | POA: Diagnosis not present

## 2017-03-18 DIAGNOSIS — Z820 Family history of epilepsy and other diseases of the nervous system: Secondary | ICD-10-CM | POA: Diagnosis not present

## 2017-03-18 DIAGNOSIS — Z7901 Long term (current) use of anticoagulants: Secondary | ICD-10-CM | POA: Insufficient documentation

## 2017-03-18 DIAGNOSIS — N83201 Unspecified ovarian cyst, right side: Secondary | ICD-10-CM

## 2017-03-18 DIAGNOSIS — Z79899 Other long term (current) drug therapy: Secondary | ICD-10-CM | POA: Diagnosis not present

## 2017-03-18 DIAGNOSIS — Z803 Family history of malignant neoplasm of breast: Secondary | ICD-10-CM | POA: Diagnosis not present

## 2017-03-18 DIAGNOSIS — Z9221 Personal history of antineoplastic chemotherapy: Secondary | ICD-10-CM | POA: Insufficient documentation

## 2017-03-18 DIAGNOSIS — C50411 Malignant neoplasm of upper-outer quadrant of right female breast: Secondary | ICD-10-CM

## 2017-03-18 DIAGNOSIS — J45909 Unspecified asthma, uncomplicated: Secondary | ICD-10-CM | POA: Insufficient documentation

## 2017-03-18 DIAGNOSIS — Z171 Estrogen receptor negative status [ER-]: Secondary | ICD-10-CM

## 2017-03-18 DIAGNOSIS — I2510 Atherosclerotic heart disease of native coronary artery without angina pectoris: Secondary | ICD-10-CM | POA: Insufficient documentation

## 2017-03-18 DIAGNOSIS — Z9889 Other specified postprocedural states: Secondary | ICD-10-CM | POA: Diagnosis not present

## 2017-03-18 DIAGNOSIS — Z86718 Personal history of other venous thrombosis and embolism: Secondary | ICD-10-CM | POA: Insufficient documentation

## 2017-03-18 DIAGNOSIS — Z880 Allergy status to penicillin: Secondary | ICD-10-CM | POA: Diagnosis not present

## 2017-03-18 DIAGNOSIS — Z808 Family history of malignant neoplasm of other organs or systems: Secondary | ICD-10-CM | POA: Diagnosis not present

## 2017-03-18 HISTORY — PX: BREAST LUMPECTOMY: SHX2

## 2017-03-18 HISTORY — PX: BREAST LUMPECTOMY WITH RADIOACTIVE SEED AND SENTINEL LYMPH NODE BIOPSY: SHX6550

## 2017-03-18 LAB — GLUCOSE, CAPILLARY
Glucose-Capillary: 232 mg/dL — ABNORMAL HIGH (ref 65–99)
Glucose-Capillary: 177 mg/dL — ABNORMAL HIGH (ref 65–99)

## 2017-03-18 SURGERY — BREAST LUMPECTOMY WITH RADIOACTIVE SEED AND SENTINEL LYMPH NODE BIOPSY
Anesthesia: General | Site: Breast | Laterality: Right

## 2017-03-18 MED ORDER — BUPIVACAINE-EPINEPHRINE (PF) 0.5% -1:200000 IJ SOLN
INTRAMUSCULAR | Status: DC | PRN
  Administered 2017-03-18: 30 mL via PERINEURAL

## 2017-03-18 MED ORDER — MEPERIDINE HCL 25 MG/ML IJ SOLN: 6.2500 mg | INTRAMUSCULAR | Status: DC | PRN

## 2017-03-18 MED ORDER — LACTATED RINGERS IV SOLN
INTRAVENOUS | Status: DC
  Administered 2017-03-18 (×2): via INTRAVENOUS

## 2017-03-18 MED ORDER — OXYCODONE HCL 5 MG PO TABS: 5.0000 mg | ORAL_TABLET | Freq: Once | ORAL | Status: DC | PRN

## 2017-03-18 MED ORDER — ENOXAPARIN SODIUM 40 MG/0.4ML ~~LOC~~ SOLN: 40.0000 mg | SUBCUTANEOUS | Status: DC

## 2017-03-18 MED ORDER — MIDAZOLAM HCL 2 MG/2ML IJ SOLN
INTRAMUSCULAR | Status: AC
  Filled 2017-03-18: qty 2

## 2017-03-18 MED ORDER — DEXAMETHASONE SODIUM PHOSPHATE 10 MG/ML IJ SOLN
INTRAMUSCULAR | Status: AC
  Filled 2017-03-18: qty 1

## 2017-03-18 MED ORDER — EPHEDRINE 5 MG/ML INJ
INTRAVENOUS | Status: AC
  Filled 2017-03-18: qty 10

## 2017-03-18 MED ORDER — TECHNETIUM TC 99M SULFUR COLLOID FILTERED
1.0000 | Freq: Once | INTRAVENOUS | Status: AC | PRN
Start: 2017-03-18 — End: 2017-03-18
  Administered 2017-03-18: 1 via INTRADERMAL

## 2017-03-18 MED ORDER — OXYCODONE HCL 5 MG PO TABS: 5.0000 mg | ORAL_TABLET | Freq: Four times a day (QID) | ORAL | 0 refills | Status: DC | PRN

## 2017-03-18 MED ORDER — PROMETHAZINE HCL 25 MG/ML IJ SOLN: 6.2500 mg | INTRAMUSCULAR | Status: DC | PRN

## 2017-03-18 MED ORDER — CLINDAMYCIN PHOSPHATE 900 MG/50ML IV SOLN
INTRAVENOUS | Status: AC
  Filled 2017-03-18: qty 50

## 2017-03-18 MED ORDER — ONDANSETRON HCL 4 MG/2ML IJ SOLN
INTRAMUSCULAR | Status: AC
  Filled 2017-03-18: qty 6

## 2017-03-18 MED ORDER — PROPOFOL 10 MG/ML IV BOLUS
INTRAVENOUS | Status: DC | PRN
  Administered 2017-03-18: 200 mg via INTRAVENOUS

## 2017-03-18 MED ORDER — HYDROMORPHONE HCL 1 MG/ML IJ SOLN
INTRAMUSCULAR | Status: AC
  Filled 2017-03-18: qty 0.5

## 2017-03-18 MED ORDER — PHENYLEPHRINE 40 MCG/ML (10ML) SYRINGE FOR IV PUSH (FOR BLOOD PRESSURE SUPPORT)
PREFILLED_SYRINGE | INTRAVENOUS | Status: AC
  Filled 2017-03-18: qty 10

## 2017-03-18 MED ORDER — CIPROFLOXACIN IN D5W 400 MG/200ML IV SOLN: 400.0000 mg | INTRAVENOUS | Status: DC

## 2017-03-18 MED ORDER — DEXAMETHASONE SODIUM PHOSPHATE 4 MG/ML IJ SOLN
INTRAMUSCULAR | Status: DC | PRN
  Administered 2017-03-18: 4 mg via INTRAVENOUS

## 2017-03-18 MED ORDER — LIDOCAINE 2% (20 MG/ML) 5 ML SYRINGE
INTRAMUSCULAR | Status: AC
  Filled 2017-03-18: qty 5

## 2017-03-18 MED ORDER — CHLORHEXIDINE GLUCONATE CLOTH 2 % EX PADS: 6.0000 | MEDICATED_PAD | Freq: Once | CUTANEOUS | Status: DC

## 2017-03-18 MED ORDER — CLINDAMYCIN PHOSPHATE 600 MG/50ML IV SOLN
600.0000 mg | Freq: Once | INTRAVENOUS | Status: DC
  Administered 2017-03-18: 900 mg via INTRAVENOUS

## 2017-03-18 MED ORDER — SCOPOLAMINE 1 MG/3DAYS TD PT72
1.0000 | MEDICATED_PATCH | Freq: Once | TRANSDERMAL | Status: DC | PRN
Start: 2017-03-18 — End: 2017-03-18
  Administered 2017-03-18: 1.5 mg via TRANSDERMAL

## 2017-03-18 MED ORDER — BUPIVACAINE-EPINEPHRINE (PF) 0.25% -1:200000 IJ SOLN
INTRAMUSCULAR | Status: DC | PRN
  Administered 2017-03-18: 10 mL

## 2017-03-18 MED ORDER — CLINDAMYCIN PHOSPHATE 900 MG/50ML IV SOLN: 900.0000 mg | Freq: Once | INTRAVENOUS | Status: DC

## 2017-03-18 MED ORDER — OXYCODONE HCL 5 MG/5ML PO SOLN: 5.0000 mg | Freq: Once | ORAL | Status: DC | PRN

## 2017-03-18 MED ORDER — MIDAZOLAM HCL 2 MG/2ML IJ SOLN
1.0000 mg | INTRAMUSCULAR | Status: DC | PRN
  Administered 2017-03-18: 2 mg via INTRAVENOUS

## 2017-03-18 MED ORDER — FENTANYL CITRATE (PF) 100 MCG/2ML IJ SOLN
INTRAMUSCULAR | Status: AC
  Filled 2017-03-18: qty 2

## 2017-03-18 MED ORDER — SCOPOLAMINE 1 MG/3DAYS TD PT72
MEDICATED_PATCH | TRANSDERMAL | Status: AC
  Filled 2017-03-18: qty 1

## 2017-03-18 MED ORDER — FENTANYL CITRATE (PF) 100 MCG/2ML IJ SOLN
50.0000 ug | INTRAMUSCULAR | Status: DC | PRN
  Administered 2017-03-18: 50 ug via INTRAVENOUS
  Administered 2017-03-18: 100 ug via INTRAVENOUS

## 2017-03-18 MED ORDER — CLINDAMYCIN PHOSPHATE 900 MG/50ML IV SOLN: 900.0000 mg | INTRAVENOUS | Status: DC

## 2017-03-18 MED ORDER — HYDROMORPHONE HCL 1 MG/ML IJ SOLN
0.2500 mg | INTRAMUSCULAR | Status: DC | PRN
  Administered 2017-03-18 (×2): 0.5 mg via INTRAVENOUS

## 2017-03-18 MED ORDER — LIDOCAINE HCL (CARDIAC) 20 MG/ML IV SOLN
INTRAVENOUS | Status: DC | PRN
  Administered 2017-03-18: 30 mg via INTRAVENOUS

## 2017-03-18 MED ORDER — SODIUM CHLORIDE 0.9 % IJ SOLN
INTRAVENOUS | Status: DC | PRN
  Administered 2017-03-18: 5 mL via INTRAMUSCULAR

## 2017-03-18 SURGICAL SUPPLY — 50 items
ADH SKN CLS APL DERMABOND .7 (GAUZE/BANDAGES/DRESSINGS) ×2
APPLIER CLIP 9.375 MED OPEN (MISCELLANEOUS) ×4
APR CLP MED 9.3 20 MLT OPN (MISCELLANEOUS) ×2
BINDER BREAST 3XL (GAUZE/BANDAGES/DRESSINGS) ×3 IMPLANT
BLADE SURG 15 STRL LF DISP TIS (BLADE) ×2 IMPLANT
BLADE SURG 15 STRL SS (BLADE) ×4
CANISTER SUCT 1200ML W/VALVE (MISCELLANEOUS) ×4 IMPLANT
CHLORAPREP W/TINT 26ML (MISCELLANEOUS) ×4 IMPLANT
CLIP APPLIE 9.375 MED OPEN (MISCELLANEOUS) ×2 IMPLANT
COVER BACK TABLE 60X90IN (DRAPES) ×4 IMPLANT
COVER MAYO STAND STRL (DRAPES) ×4 IMPLANT
COVER PROBE W GEL 5X96 (DRAPES) ×1 IMPLANT
DECANTER SPIKE VIAL GLASS SM (MISCELLANEOUS) ×4 IMPLANT
DERMABOND ADVANCED (GAUZE/BANDAGES/DRESSINGS) ×2
DERMABOND ADVANCED .7 DNX12 (GAUZE/BANDAGES/DRESSINGS) ×2 IMPLANT
DEVICE DUBIN W/COMP PLATE 8390 (MISCELLANEOUS) ×4 IMPLANT
DRAPE LAPAROSCOPIC ABDOMINAL (DRAPES) ×4 IMPLANT
DRAPE LAPAROTOMY 100X72 PEDS (DRAPES) ×4 IMPLANT
DRAPE UTILITY XL STRL (DRAPES) ×4 IMPLANT
ELECT COATED BLADE 2.86 ST (ELECTRODE) ×4 IMPLANT
ELECT REM PT RETURN 9FT ADLT (ELECTROSURGICAL) ×4
ELECTRODE REM PT RTRN 9FT ADLT (ELECTROSURGICAL) ×2 IMPLANT
GLOVE BIO SURGEON STRL SZ 6.5 (GLOVE) ×2 IMPLANT
GLOVE BIO SURGEONS STRL SZ 6.5 (GLOVE) ×1
GLOVE BIOGEL PI IND STRL 7.0 (GLOVE) ×1 IMPLANT
GLOVE BIOGEL PI IND STRL 8 (GLOVE) ×2 IMPLANT
GLOVE BIOGEL PI INDICATOR 7.0 (GLOVE) ×2
GLOVE BIOGEL PI INDICATOR 8 (GLOVE) ×2
GLOVE ECLIPSE 8.0 STRL XLNG CF (GLOVE) ×7 IMPLANT
GOWN STRL REUS W/ TWL LRG LVL3 (GOWN DISPOSABLE) ×4 IMPLANT
GOWN STRL REUS W/TWL LRG LVL3 (GOWN DISPOSABLE) ×8
KIT MARKER MARGIN INK (KITS) ×4 IMPLANT
NDL HYPO 25X1 1.5 SAFETY (NEEDLE) ×1 IMPLANT
NDL SAFETY ECLIPSE 18X1.5 (NEEDLE) ×1 IMPLANT
NEEDLE HYPO 18GX1.5 SHARP (NEEDLE) ×4
NEEDLE HYPO 25X1 1.5 SAFETY (NEEDLE) ×8 IMPLANT
NS IRRIG 1000ML POUR BTL (IV SOLUTION) ×4 IMPLANT
PACK BASIN DAY SURGERY FS (CUSTOM PROCEDURE TRAY) ×4 IMPLANT
PENCIL BUTTON HOLSTER BLD 10FT (ELECTRODE) ×4 IMPLANT
SLEEVE SCD COMPRESS KNEE MED (MISCELLANEOUS) ×4 IMPLANT
SPONGE LAP 4X18 X RAY DECT (DISPOSABLE) ×4 IMPLANT
SUT MNCRL AB 4-0 PS2 18 (SUTURE) ×4 IMPLANT
SUT MON AB 4-0 PC3 18 (SUTURE) ×4 IMPLANT
SUT VICRYL 3-0 CR8 SH (SUTURE) ×4 IMPLANT
SYR CONTROL 10ML LL (SYRINGE) ×7 IMPLANT
TOWEL OR 17X24 6PK STRL BLUE (TOWEL DISPOSABLE) ×4 IMPLANT
TOWEL OR NON WOVEN STRL DISP B (DISPOSABLE) ×4 IMPLANT
TUBE CONNECTING 20'X1/4 (TUBING) ×1
TUBE CONNECTING 20X1/4 (TUBING) ×3 IMPLANT
YANKAUER SUCT BULB TIP NO VENT (SUCTIONS) ×4 IMPLANT

## 2017-03-18 NOTE — Anesthesia Procedure Notes (Signed)
Anesthesia Regional Block: Pectoralis block   Pre-Anesthetic Checklist: ,, timeout performed, Correct Patient, Correct Site, Correct Laterality, Correct Procedure, Correct Position, site marked, Risks and benefits discussed,  Surgical consent,  Pre-op evaluation,  At surgeon's request and post-op pain management  Laterality: Right  Prep: chloraprep       Needles:   Needle Type: Stimiplex     Needle Length: 9cm      Additional Needles:   Procedures: ultrasound guided,,,,,,,,  Narrative:  Start time: 03/18/2017 9:50 AM End time: 03/18/2017 10:06 AM Injection made incrementally with aspirations every 5 mL.  Performed by: Personally  Anesthesiologist: Nolon Nations  Additional Notes: Patient tolerated well. Good fascial spread noted.

## 2017-03-18 NOTE — Anesthesia Preprocedure Evaluation (Signed)
Anesthesia Evaluation  Patient identified by MRN, date of birth, ID band Patient awake    Reviewed: Allergy & Precautions, NPO status , Patient's Chart, lab work & pertinent test results  History of Anesthesia Complications Negative for: history of anesthetic complications  Airway Mallampati: I  TM Distance: >3 FB Neck ROM: Full    Dental  (+) Dental Advisory Given   Pulmonary asthma (no inhaler in over a year) , Current Smoker, former smoker,    breath sounds clear to auscultation       Cardiovascular + Peripheral Vascular Disease  negative cardio ROS   Rhythm:Regular Rate:Normal     Neuro/Psych PSYCHIATRIC DISORDERS Anxiety Depression negative neurological ROS     GI/Hepatic negative GI ROS, Neg liver ROS,   Endo/Other  diabetes, Oral Hypoglycemic AgentsMorbid obesity  Renal/GU negative Renal ROS     Musculoskeletal   Abdominal (+) + obese,   Peds  Hematology  (+) anemia ,   Anesthesia Other Findings   Reproductive/Obstetrics                             Anesthesia Physical  Anesthesia Plan  ASA: III  Anesthesia Plan: General   Post-op Pain Management:    Induction: Intravenous  PONV Risk Score and Plan: 4 or greater and Ondansetron, Dexamethasone, Midazolam and Scopolamine patch - Pre-op  Airway Management Planned: LMA  Additional Equipment:   Intra-op Plan:   Post-operative Plan: Extubation in OR  Informed Consent: I have reviewed the patients History and Physical, chart, labs and discussed the procedure including the risks, benefits and alternatives for the proposed anesthesia with the patient or authorized representative who has indicated his/her understanding and acceptance.   Dental advisory given  Plan Discussed with: CRNA  Anesthesia Plan Comments:        Anesthesia Quick Evaluation

## 2017-03-18 NOTE — Discharge Instructions (Signed)
[facility] Office Phone Number [phone]  BREAST BIOPSY/ PARTIAL MASTECTOMY: POST OP INSTRUCTIONS  Always review your discharge instruction sheet given to you by the facility where your surgery was performed.  IF YOU HAVE DISABILITY OR FAMILY LEAVE FORMS, YOU MUST BRING THEM TO THE OFFICE FOR PROCESSING.  DO NOT GIVE THEM TO YOUR DOCTOR.  1. A prescription for pain medication may be given to you upon discharge.  Take your pain medication as prescribed, if needed.  If narcotic pain medicine is not needed, then you may take acetaminophen (Tylenol) or ibuprofen (Advil) as needed. 2. Take your usually prescribed medications unless otherwise directed 3. If you need a refill on your pain medication, please contact your pharmacy.  They will contact our office to request authorization.  Prescriptions will not be filled after 5pm or on week-ends. 4. You should eat very light the first 24 hours after surgery, such as soup, crackers, pudding, etc.  Resume your normal diet the day after surgery. 5. Most patients will experience some swelling and bruising in the breast.  Ice packs and a good support bra will help.  Swelling and bruising can take several days to resolve.  6. It is common to experience some constipation if taking pain medication after surgery.  Increasing fluid intake and taking a stool softener will usually help or prevent this problem from occurring.  A mild laxative (Milk of Magnesia or Miralax) should be taken according to package directions if there are no bowel movements after 48 hours. 7. Unless discharge instructions indicate otherwise, you may remove your bandages 24-48 hours after surgery, and you may shower at that time.  You may have steri-strips (Atteberry skin tapes) in place directly over the incision.  These strips should be left on the skin for 7-10 days.  If your surgeon used skin glue on the incision, you may shower in 24 hours.  The glue will flake off over the  next 2-3 weeks.  Any sutures or staples will be removed at the office during your follow-up visit. 8. ACTIVITIES:  You may resume regular daily activities (gradually increasing) beginning the next day.  Wearing a good support bra or sports bra minimizes pain and swelling.  You may have sexual intercourse when it is comfortable. a. You may drive when you no longer are taking prescription pain medication, you can comfortably wear a seatbelt, and you can safely maneuver your car and apply brakes. b. RETURN TO WORK:  ______________________________________________________________________________________ 9. You should see your doctor in the office for a follow-up appointment approximately two weeks after your surgery.  Your doctors nurse will typically make your follow-up appointment when she calls you with your pathology report.  Expect your pathology report 2-3 business days after your surgery.  You may call to check if you do not hear from [place] after three days. 10. OTHER INSTRUCTIONS: _______________________________________________________________________________________________ _____________________________________________________________________________________________________________________________________ _____________________________________________________________________________________________________________________________________ _____________________________________________________________________________________________________________________________________  WHEN TO CALL YOUR DOCTOR: 1. Fever over 101.0 2. Nausea and/or vomiting. 3. Extreme swelling or bruising. 4. Continued bleeding from incision. 5. Increased pain, redness, or drainage from the incision.  The clinic staff is available to answer your questions during regular business hours.  Please dont hesitate to call and ask to speak to one of the nurses for clinical concerns.  If you have a medical emergency, go to the nearest  emergency room or call 911.  A surgeon from [facility] is always on call at the hospital.  For further questions, please visit [email or web address]  RESTART BLOOD THINNER IN 36 HOURS        Post Anesthesia Home Care Instructions  Activity: Get plenty of rest for the remainder of the day. A responsible individual must stay with you for 24 hours following the procedure.  For the next 24 hours, DO NOT: -Drive a car -Paediatric nurse -Drink alcoholic beverages -Take any medication unless instructed by your physician -Make any legal decisions or sign important papers.  Meals: Start with liquid foods such as gelatin or soup. Progress to regular foods as tolerated. Avoid greasy, spicy, heavy foods. If nausea and/or vomiting occur, drink only clear liquids until the nausea and/or vomiting subsides. Call your physician if vomiting continues.  Special Instructions/Symptoms: Your throat may feel dry or sore from the anesthesia or the breathing tube placed in your throat during surgery. If this causes discomfort, gargle with warm salt water. The discomfort should disappear within 24 hours.  If you had a scopolamine patch placed behind your ear for the management of post- operative nausea and/or vomiting:  1. The medication in the patch is effective for 72 hours, after which it should be removed.  Wrap patch in a tissue and discard in the trash. Wash hands thoroughly with soap and water. 2. You may remove the patch earlier than 72 hours if you experience unpleasant side effects which may include dry mouth, dizziness or visual disturbances. 3. Avoid touching the patch. Wash your hands with soap and water after contact with the patch.      Regional Anesthesia Blocks  1. Numbness or the inability to move the "blocked" extremity may last from 3-48 hours after placement. The length of time depends on the medication injected and your individual  response to the medication. If the numbness is not going away after 48 hours, call your surgeon.  2. The extremity that is blocked will need to be protected until the numbness is gone and the  Strength has returned. Because you cannot feel it, you will need to take extra care to avoid injury. Because it may be weak, you may have difficulty moving it or using it. You may not know what position it is in without looking at it while the block is in effect.  3. For blocks in the legs and feet, returning to weight bearing and walking needs to be done carefully. You will need to wait until the numbness is entirely gone and the strength has returned. You should be able to move your leg and foot normally before you try and bear weight or walk. You will need someone to be with you when you first try to ensure you do not fall and possibly risk injury.  4. Bruising and tenderness at the needle site are common side effects and will resolve in a few days.  5. Persistent numbness or new problems with movement should be communicated to the surgeon or the [facility] [phone] [place] [phone]).

## 2017-03-18 NOTE — Interval H&P Note (Signed)
History and Physical Interval Note:  03/18/2017 9:57 AM  Kelly Mendez  has presented today for surgery, with the diagnosis of RIGHT BREAST CANCER  The various methods of treatment have been discussed with the patient and family. After consideration of risks, benefits and other options for treatment, the patient has consented to  Procedure(s): RIGHT BREAST LUMPECTOMY WITH RADIOACTIVE SEED AND RIGHT SENTINEL LYMPH NODE BIOPSY (Right) REMOVAL PORT-A-CATH (N/A) as a surgical intervention .  The patient's history has been reviewed, patient examined, no change in status, stable for surgery.  I have reviewed the patient's chart and labs.  Questions were answered to the patient's satisfaction.    Pt will keep port for clinical trial   Kelly Mendez A.

## 2017-03-18 NOTE — Anesthesia Postprocedure Evaluation (Signed)
Anesthesia Post Note  Patient: Kelly Mendez  Procedure(s) Performed: Procedure(s) (LRB): RIGHT BREAST LUMPECTOMY WITH RADIOACTIVE SEED AND RIGHT SENTINEL LYMPH NODE BIOPSY (Right)     Patient location during evaluation: PACU Anesthesia Type: General Level of consciousness: sedated and patient cooperative Pain management: pain level controlled Vital Signs Assessment: post-procedure vital signs reviewed and stable Respiratory status: spontaneous breathing Cardiovascular status: stable Anesthetic complications: no    Last Vitals:  Vitals:   03/18/17 1153 03/18/17 1154  BP:    Pulse: (!) 101 (!) 102  Resp: 15 19  Temp:    SpO2: 99% 100%    Last Pain:  Vitals:   03/18/17 1150  TempSrc:   PainSc: [place]

## 2017-03-18 NOTE — H&P (View-Only) (Signed)
Kelly Mendez 03/06/2017 9:48 AM Location: [facility] Patient #: [id number] DOB: 05/26/1976 Married / Language: [name] / Race: White Female  History of Present Illness Kelly Mendez A. Kelly [name] MD; 03/06/2017 10:12 AM) Patient words: Patient returns for a follow-up of her stage I right breast cancer. She received neoadjuvant chemotherapy and she underwent genetics. She's had modest reduction in size over right breast tumor. She is ready to proceed with lumpectomy. Today she developed a right upper straight DVT and is on anticoagulation. Overall, she is recovering well from chemotherapy but she did have issues during chemotherapy and this had to be stopped. I reviewed her MRI which shows modest reduction in the stage I right breast cancer. She has no adenopathy. Left breast is otherwise unremarkable. Oncology:                      41 y.o. [place] woman status post right breast upper inner quadrant biopsy 10/16/2016 for a clinical T1b pN0, stage IB invasive ductal carcinoma, grade 3, triple negative, with an MIB-1 of 80%.  (1) genetics testing 12/01/2016 showed several variants of uncertain significance but no deleterious mutation  (2) neoadjuvant chemotherapy to consist of doxorubicin and cyclophosphamide in dose dense fashion 4 starting 11/05/2016, received third cycle 12/03/2016 then proceeded to weekly Abraxane starting 01/01/2017 (a) cycle 4 of cyclophosphamide and doxorubicin given at end of Abraxane on 02/12/2017 (b) Abraxane given from 01/01/17-01/22/17 (4 cycles), stopped early due to peripheral neuropathy  (3) breast conserving surgery to follow  (4) adjuvant radiation to follow chemotherapy   (5) extensive right upper extremity deep venous thrombosis documented 11/23/2016, treated initially with Lovenox (a) transitioned to rivaroxaban as of 11/30/2016 (b) total 6 months anticoagulation planned (one month  beyond port removal)  (6) tobacco abuse: The patient quit smoking 11/26/2016  (7) menometrorrhagia, with a cystic right adnexal lesion and a possible cervical polyp noted on ultrasound 12/16/2016, with benign endocervical curettage and endometrial biopsy 12/16/2016 (a) CA 125 12/17/2016 was 14.2 (normal). (b) adnexal mass, likely benign, will be explored at the completion of chemotherapy  (8) goserelin started 12/16/2016  ADDITIONAL INFORMATION: 1. PROGNOSTIC INDICATORS Results: IMMUNOHISTOCHEMICAL AND MORPHOMETRIC ANALYSIS PERFORMED MANUALLY Estrogen Receptor: 0%, NEGATIVE Progesterone Receptor: 0%, NEGATIVE Proliferation Marker Ki67: 80% COMMENT: The negative hormone receptor study(ies) in this case has No internal positive control. REFERENCE RANGE ESTROGEN RECEPTOR NEGATIVE 0% POSITIVE =>1% REFERENCE RANGE PROGESTERONE RECEPTOR NEGATIVE 0% POSITIVE =>1% All controls stained appropriately Kelly [name] Pathologist, Electronic Signature ( Signed 10/21/2016) 1. FLUORESCENCE IN-SITU HYBRIDIZATION Results: HER2 - NEGATIVE RATIO OF HER2/CEP17 SIGNALS 1.30 AVERAGE HER2 COPY NUMBER PER CELL 1.75 1 of 3 FINAL for Kelly Mendez ([id number]) ADDITIONAL INFORMATION:(continued) Reference Range: NEGATIVE HER2/CEP17 Ratio <2.0 and average HER2 copy number <4.0 EQUIVOCAL HER2/CEP17 Ratio <2.0 and average HER2 copy number >=4.0 and <6.0 POSITIVE HER2/CEP17 Ratio >=2.0 or <2.0 and average HER2 copy number >=6.0 Kelly [name] Pathologist, Electronic Signature ( Signed 10/22/2016) FINAL DIAGNOSIS Diagnosis 1. Breast, right, needle core biopsy, 1:00 o'clock - INVASIVE DUCTAL CARCINOMA. - SEE COMMENT. 2. Lymph node, needle/core biopsy, right axilla - THERE IS NO EVIDENCE OF CARCINOMA IN 1 OF 1 LYMPH NODE (0/1). Microscopic Comment 1. The carcinoma appears grade 3. A breast prognostic profile will be performed and results reported separately. The results are  called to [name] on 10/17/2016. (JBK:ah 10/17/16) Kelly [name] Pathologist, Electronic Signature (Case signed 10/17/2016       ADDITIONAL INFORMATION: 1. PROGNOSTIC INDICATORS Results:  IMMUNOHISTOCHEMICAL AND MORPHOMETRIC ANALYSIS PERFORMED MANUALLY Estrogen Receptor: 0%, NEGATIVE Progesterone Receptor: 0%, NEGATIVE Proliferation Marker Ki67: 80% COMMENT: The negative hormone receptor study(ies) in this case has No internal positive control. REFERENCE RANGE ESTROGEN RECEPTOR NEGATIVE 0% POSITIVE =>1% REFERENCE RANGE PROGESTERONE RECEPTOR NEGATIVE 0% POSITIVE =>1% All controls stained appropriately Kelly [name] Pathologist, Electronic Signature ( Signed 10/21/2016) 1. FLUORESCENCE IN-SITU HYBRIDIZATION Results: HER2 - NEGATIVE RATIO OF HER2/CEP17 SIGNALS 1.30 AVERAGE HER2 COPY NUMBER PER CELL 1.75 1 of 3 FINAL for Kelly Mendez ([id number]) ADDITIONAL INFORMATION:(continued) Reference Range: NEGATIVE HER2/CEP17 Ratio <2.0 and average HER2 copy number <4.0 EQUIVOCAL HER2/CEP17 Ratio <2.0 and average HER2 copy number >=4.0 and <6.0 POSITIVE HER2/CEP17 Ratio >=2.0 or <2.0 and average HER2 copy number >=6.0 Kelly [name] Pathologist, Electronic Signature ( Signed 10/22/2016) FINAL DIAGNOSIS Diagnosis 1. Breast, right, needle core biopsy, 1:00 o'clock - INVASIVE DUCTAL CARCINOMA. - SEE COMMENT. 2. Lymph node, needle/core biopsy, right axilla - THERE IS NO EVIDENCE OF CARCINOMA IN 1 OF 1 LYMPH NODE (0/1). Microscopic Comment 1. The carcinoma appears grade 3. A breast prognostic profile will be performed and results reported separately. The results are called to [name] on 10/17/2016. (JBK:ah 10/17/16) Kelly [name] Pathologist, Electronic Signature (Case signed 10/17/2016            CLINICAL DATA: Patient with history right breast carcinoma. Patient status post neoadjuvant chemotherapy. Follow-up  evaluation.  EXAM: BILATERAL BREAST MRI WITH AND WITHOUT CONTRAST  TECHNIQUE: Multiplanar, multisequence MR images of both breasts were obtained prior to and following the intravenous administration of 20 ml of MultiHance.  THREE-DIMENSIONAL MR IMAGE RENDERING ON INDEPENDENT WORKSTATION:  Three-dimensional MR images were rendered by post-processing of the original MR data on an independent workstation. The three-dimensional MR images were interpreted, and findings are reported in the following complete MRI report for this study. Three dimensional images were evaluated at the independent DynaCad workstation.  COMPARISON: Previous exam(s).  FINDINGS: Breast composition: b. Scattered fibroglandular tissue.  Background parenchymal enhancement: Minimal  Right breast: Mass within the upper inner right breast has decreased in size when compared to prior exam measuring 1.0 x 1.0 x 0.5 cm, previously measuring 1.2 x 1.5 x 1.2 cm when measured at the same dimension/level.  Left breast: No mass or abnormal enhancement.  Lymph nodes: No abnormal appearing lymph nodes.  Ancillary findings: Right anterior chest wall Port-A-Cath.  IMPRESSION: Interval decrease in size of biopsy-proven right breast carcinoma.  RECOMMENDATION: Treatment plan for known right breast malignancy.  BI-RADS CATEGORY 6: Known biopsy-proven malignancy.   Electronically Signed By: Lovey Newcomer M.D. On: 03/03/2017 11:01.  The patient is a 41 year old female.   Allergies (Tanisha A. Owens Shark, [place]; 03/06/2017 9:49 AM) Penicillin G Benzathine & Proc *PENICILLINS* Allergies Reconciled  Medication History (Tanisha A. Owens Shark, [place]; 03/06/2017 9:50 AM) Rivaroxaban (20MG Tablet, Oral) Active. Atorvastatin Calcium (20MG Tablet, Oral) Active. MetFORMIN HCl (1000MG Tablet, Oral) Active. MetFORMIN HCl (500MG Tablet, Oral) Active. OneTouch Delica Lancets 97W Active. OneTouch Verio (In Vitro)  Active. Ibuprofen (200MG Tablet, Oral) Active. Medications Reconciled    Vitals (Tanisha A. Brown RMA; 03/06/2017 9:49 AM) 03/06/2017 9:49 AM Weight: 336.8 lb Height: 74in Body Surface Area: 2.71 m Body Mass Index: 43.24 kg/m  Temp.: 97.41F  Pulse: 69 (Regular)  BP: 134/82 (Sitting, Left Arm, Standard)      Physical Exam (Nikolas Casher A. Draper Gallon MD; 03/06/2017 10:13 AM)  General Mental Status-Alert. General Appearance-Consistent with stated age. Hydration-Well hydrated. Voice-Normal.  Head and Neck  Head-normocephalic, atraumatic with no lesions or palpable masses. Trachea-midline. Thyroid Gland Characteristics - normal size and consistency.  Eye Eyeball - Bilateral-Extraocular movements intact. Sclera/Conjunctiva - Bilateral-No scleral icterus.  Chest and Lung Exam Chest and lung exam reveals -quiet, even and easy respiratory effort with no use of accessory muscles and on auscultation, normal breath sounds, no adventitious sounds and normal vocal resonance. Inspection Chest Wall - Normal. Back - normal.  Breast Note: Right breast is without mass lesion. There is no right axillary adenopathy. Left breast is normal. Right upper chest noted. No significant right arm edema.  Cardiovascular Cardiovascular examination reveals -normal heart sounds, regular rate and rhythm with no murmurs and normal pedal pulses bilaterally.  Neurologic Neurologic evaluation reveals -alert and oriented x 3 with no impairment of recent or remote memory. Mental Status-Normal.  Musculoskeletal Normal Exam - Left-Upper Extremity Strength Normal and Lower Extremity Strength Normal. Normal Exam - Right-Upper Extremity Strength Normal and Lower Extremity Strength Normal.  Lymphatic Head & Neck  General Head & Neck Lymphatics: Bilateral - Description - Normal. Axillary  General Axillary Region: Bilateral - Description - Normal. Tenderness - Non  Tender.    Assessment & Plan (Suha Schoenbeck A. Khalel Alms MD; 03/06/2017 10:13 AM)  BREAST CANCER, RIGHT (C50.911) Impression: Patient ready for right breast lumpectomy with sentinel mapping. Her Port-A-Cath will be removed as well. Risk of lumpectomy include bleeding, infection, seroma, more surgery, use of seed/wire, wound care, cosmetic deformity and the need for other treatments, death , blood clots, death. Pt agrees to proceed. Risk of sentinel lymph node mapping include bleeding, infection, lymphedema, shoulder pain. stiffness, dye allergy. cosmetic deformity , blood clots, death, need for more surgery. Pt agres to proceed.  Current Plans You are being scheduled for surgery- Our schedulers will call you.  You should hear from our office's scheduling department within 5 working days about the location, date, and time of surgery. We try to make accommodations for patient's preferences in scheduling surgery, but sometimes the OR schedule or the surgeon's schedule prevents [place] from making those accommodations.  If you have not heard from our office [phone]) in 5 working days, call the office and ask for your surgeon's nurse.  If you have other questions about your diagnosis, plan, or surgery, call the office and ask for your surgeon's nurse.  Pt Education - flb breast cancer surgery: discussed with patient and provided information. We discussed the staging and pathophysiology of breast cancer. We discussed all of the different options for treatment for breast cancer including surgery, chemotherapy, radiation therapy, Herceptin, and antiestrogen therapy. We discussed a sentinel lymph node biopsy as she does not appear to having lymph node involvement right now. We discussed the performance of that with injection of radioactive tracer and blue dye. We discussed that she would have an incision underneath her axillary hairline. We discussed that there is a bout a 10-20% chance of having a positive  node with a sentinel lymph node biopsy and we will await the permanent pathology to make any other first further decisions in terms of her treatment. One of these options might be to return to the operating room to perform an axillary lymph node dissection. We discussed about a 1-2% risk lifetime of chronic shoulder pain as well as lymphedema associated with a sentinel lymph node biopsy. We discussed the options for treatment of the breast cancer which included lumpectomy versus a mastectomy. We discussed the performance of the lumpectomy with a wire placement. We discussed a 10-20% chance  of a positive margin requiring reexcision in the operating room. We also discussed that she may need radiation therapy or antiestrogen therapy or both if she undergoes lumpectomy. We discussed the mastectomy and the postoperative care for that as well. We discussed that there is no difference in her survival whether she undergoes lumpectomy with radiation therapy or antiestrogen therapy versus a mastectomy. There is a slight difference in the local recurrence rate being 3-5% with lumpectomy and about 1% with a mastectomy. We discussed the risks of operation including bleeding, infection, possible reoperation. She understands her further therapy will be based on what her stages at the time of her operation.  Pt Education - ABC (After Breast Cancer) Class Info: discussed with patient and provided information. I recommended surgery to remove the catheter. I explained the technique of removal with use of local anesthesia & possible need for more aggressive sedation/anesthesia for patient comfort.  Risks such as bleeding, infection, and other risks were discussed. Post-operative dressing/incision care was discussed. I noted a good likelihood this will help address the problem. We will work to minimize complications. Questions were answered. The patient expresses understanding & wishes to proceed with surgery.

## 2017-03-18 NOTE — Transfer of Care (Signed)
Immediate Anesthesia Transfer of Care Note  Patient: Kelly Mendez  Procedure(s) Performed: Procedure(s): RIGHT BREAST LUMPECTOMY WITH RADIOACTIVE SEED AND RIGHT SENTINEL LYMPH NODE BIOPSY (Right)  Patient Location: PACU  Anesthesia Type:GA combined with regional for post-op pain  Level of Consciousness: awake and patient cooperative  Airway & Oxygen Therapy: Patient Spontanous Breathing and Patient connected to face mask oxygen  Post-op Assessment: Report given to RN and Post -op Vital signs reviewed and stable  Post vital signs: Reviewed and stable  Last Vitals:  Vitals:   03/18/17 0826  BP: 125/84  Pulse: 87  Resp: 20  Temp: 37.1 C  SpO2: 100%    Last Pain:  Vitals:   03/18/17 0826  TempSrc: Oral         Complications: No apparent anesthesia complications

## 2017-03-18 NOTE — Anesthesia Procedure Notes (Signed)
Procedure Name: LMA Insertion Date/Time: 03/18/2017 10:33 AM Performed by: Kiandria Clum D Pre-anesthesia Checklist: Patient identified, Emergency Drugs available, Suction available and Patient being monitored Patient Re-evaluated:Patient Re-evaluated prior to induction Oxygen Delivery Method: Circle system utilized Preoxygenation: Pre-oxygenation with 100% oxygen Induction Type: IV induction Ventilation: Mask ventilation without difficulty LMA: LMA inserted LMA Size: 4.0 Number of attempts: 1 Airway Equipment and Method: Bite block Placement Confirmation: positive ETCO2 Tube secured with: Tape Dental Injury: Teeth and Oropharynx as per pre-operative assessment

## 2017-03-18 NOTE — Progress Notes (Signed)
Assisted Dr. Germeroth with right, ultrasound guided, pectoralis block. Side rails up, monitors on throughout procedure. See vital signs in flow sheet. Tolerated Procedure well. 

## 2017-03-18 NOTE — Op Note (Signed)
Preoperative diagnosis: Right breast cancer  Postoperative diagnosis: Same  Procedure: Right breast seed localized partial mastectomy with right axillary deep sentinel lymph node mapping with methylene blue dye  Surgeon: [name] M.D.  Anesthesia: Gen. with local with additional pectoral block procedure  EBL: 10 mL  Drains: None  Specimen: Right breast mass with eating clip to pathology with additional anterior margin and 2 hot and blue right axillary sentinel lymph nodes deep  Indications for procedure: The patient presents for breast conserving surgery after neoadjuvant therapy for stage I right breast cancer. Risks, benefits and alternatives to surgery were discussed with the patient.The procedure has been discussed with the patient. Alternatives to surgery have been discussed with the patient.  Risks of surgery include bleeding,  Infection,  Seroma formation, death,  and the need for further surgery.   The patient understands and wishes to proceed.    Description of procedure: The patient brought to the operating room after being seen by anesthesia and having pectoral walk placed the right. She underwent see localization as an outpatient. She underwent injection of technetium sulfur colloid in the holding area of the right breast. All questions were answered. After being brought to the operating room, she was placed upon the OR table. After induction of general anesthesia, right breast was prepped and draped in a sterile fashion was done. She received preoperative antibiotics. 4 mL of methylene blue dye were injected under the right nipple. Neoprobe was used and hotspot identified in the right breast upper inner quadrant. Transverse incision was made after injection of local anesthetic and the skin. Dissection was carried down all tissue around the seating clip were excised with a grossly negative margin. Radiograph taken which showed seeing clip to be in the specimen. The anterior  margin appeared close therefore more this was taken and sent separately. We was hemostatic. Was closed with 3-0 Vicryl for Monocryl.  The sono was done. Neoprobe was used and hotspot identified in the right axilla. Incision was made the right axilla. Dissection was carried down into the deep axillary contents. 2 hot loose nodes which are level I nodes removed. Background counts approached 0. Hemostasis achieved. Wound closed with 3-0 Vicryl for Monocryl. Dermabond applied to both incisions. All final counts are correct. Breast binder placed. The patient was awoke extubated taken to recovery in satisfactory condition.

## 2017-03-19 ENCOUNTER — Encounter (HOSPITAL_BASED_OUTPATIENT_CLINIC_OR_DEPARTMENT_OTHER): Payer: Self-pay | Admitting: Surgery

## 2017-03-19 ENCOUNTER — Telehealth: Payer: Self-pay | Admitting: Oncology

## 2017-03-19 NOTE — Telephone Encounter (Signed)
[place]

## 2017-03-25 ENCOUNTER — Ambulatory Visit (INDEPENDENT_AMBULATORY_CARE_PROVIDER_SITE_OTHER): Payer: Managed Care, Other (non HMO) | Admitting: Family Medicine

## 2017-03-25 ENCOUNTER — Encounter: Payer: Self-pay | Admitting: Family Medicine

## 2017-03-25 VITALS — BP 120/80 | HR 72 | Ht 73.0 in | Wt 335.4 lb

## 2017-03-25 DIAGNOSIS — R350 Frequency of micturition: Secondary | ICD-10-CM | POA: Diagnosis not present

## 2017-03-25 DIAGNOSIS — E118 Type 2 diabetes mellitus with unspecified complications: Secondary | ICD-10-CM

## 2017-03-25 DIAGNOSIS — Z7901 Long term (current) use of anticoagulants: Secondary | ICD-10-CM

## 2017-03-25 DIAGNOSIS — E119 Type 2 diabetes mellitus without complications: Secondary | ICD-10-CM | POA: Diagnosis not present

## 2017-03-25 DIAGNOSIS — Z79899 Other long term (current) drug therapy: Secondary | ICD-10-CM

## 2017-03-25 DIAGNOSIS — E1165 Type 2 diabetes mellitus with hyperglycemia: Secondary | ICD-10-CM

## 2017-03-25 DIAGNOSIS — IMO0002 Reserved for concepts with insufficient information to code with codable children: Secondary | ICD-10-CM

## 2017-03-25 DIAGNOSIS — C50211 Malignant neoplasm of upper-inner quadrant of right female breast: Secondary | ICD-10-CM | POA: Diagnosis not present

## 2017-03-25 DIAGNOSIS — Z171 Estrogen receptor negative status [ER-]: Secondary | ICD-10-CM

## 2017-03-25 DIAGNOSIS — I82A11 Acute embolism and thrombosis of right axillary vein: Secondary | ICD-10-CM

## 2017-03-25 LAB — POCT URINALYSIS DIP (PROADVANTAGE DEVICE)
Bilirubin, UA: NEGATIVE
Blood, UA: NEGATIVE
Glucose, UA: NEGATIVE mg/dL
Ketones, POC UA: NEGATIVE mg/dL
Leukocytes, UA: NEGATIVE
Nitrite, UA: NEGATIVE
Protein Ur, POC: NEGATIVE mg/dL
Specific Gravity, Urine: 1.025
Urobilinogen, Ur: NEGATIVE
pH, UA: 6 (ref 5.0–8.0)

## 2017-03-25 LAB — LIPID PANEL
Cholesterol: 120 mg/dL (ref <200)
HDL: 31 mg/dL — ABNORMAL LOW (ref >50)
LDL Cholesterol (Calc): 71 mg/dL (calc)
Non-HDL Cholesterol (Calc): 89 mg/dL (calc) (ref <130)
Total CHOL/HDL Ratio: 3.9 (calc) (ref <5.0)
Triglycerides: 101 mg/dL (ref <150)

## 2017-03-25 LAB — POCT CBG (FASTING - GLUCOSE)-MANUAL ENTRY: Glucose Fasting, POC: 177 mg/dL — AB (ref 70–99)

## 2017-03-25 LAB — TSH: TSH: 2.36 mIU/L

## 2017-03-25 LAB — POCT GLYCOSYLATED HEMOGLOBIN (HGB A1C): Hemoglobin A1C: 7.8

## 2017-03-25 LAB — EXTRA LAV TOP TUBE

## 2017-03-25 MED ORDER — METFORMIN HCL 1000 MG PO TABS: ORAL_TABLET | ORAL | 2 refills | Status: DC

## 2017-03-25 MED ORDER — ATORVASTATIN CALCIUM 20 MG PO TABS: 20.0000 mg | ORAL_TABLET | Freq: Every day | ORAL | 2 refills | Status: DC

## 2017-03-25 MED ORDER — DULAGLUTIDE 0.75 MG/0.5ML ~~LOC~~ SOAJ: 1.0000 "application " | SUBCUTANEOUS | 1 refills | Status: DC

## 2017-03-25 NOTE — Patient Instructions (Signed)
Continue checking your blood sugars.   Start once weekly Trulicity.   Call me in 2 weeks and let me know what your blood sugar readings have been.   Follow up in 3 months for diabetes.

## 2017-03-25 NOTE — Progress Notes (Signed)
Subjective:    Patient ID: Kelly Mendez, female    DOB: 1975-10-01, 41 y.o.   MRN: [id number]  Kelly Mendez is a 41 y.o. female who presents for follow-up of Type 2 diabetes mellitus.  She is currently undergoing treatments for breast cancer. Completed chemotherapy early due to peripheral neuropathy.  Last chemo treatment August 9th.   Dr. Jana Hakim is her oncologist.   Patient is checking home blood sugars.   Home blood sugar records: BGs range between 120 and 199 fasting.  No recent blood sugars greater than 200. States 2 hours after meals her BSs are averaging 140s.  How often is blood sugars being checked: twice a day Current symptoms include: none. Patient denies vomiting and weight loss.  Patient is checking their feet daily. Any Foot concerns (callous, ulcer, wound, thickened nails, toenail fungus, skin fungus, hammer toe): none Last dilated eye exam: 2-3 weeks ago  Current treatments: none. Medication compliance: good  Current diet: in general, a "healthy" diet   Current exercise: walking a mile 3 times a week Known diabetic complications: none   Dr. [name] is her OB/GYN. She has an appointment with her in November.  She has been on disability due to her illness.   History of childhood asthma and no flares in years.   The following portions of the patient's history were reviewed and updated as appropriate: allergies, current medications, past medical history, past social history and problem list.  ROS as in subjective above.     Objective:    Physical Exam Alert and in no distress otherwise not examined.  Blood pressure 120/80, pulse 72, height 6\' 1"  (1.854 m), weight (!) 335 lb 6.4 oz (152.1 kg).  Lab Review Diabetic Labs Latest Ref Rng & Units 03/25/2017 03/06/2017 02/19/2017 02/12/2017 02/05/2017  HbA1c - 7.8% - - - -  Microalbumin Not estab mg/dL - - - - -  Micro/Creat Ratio <30 mcg/mg creat - - - - -  Chol <200 mg/dL - - - - -  HDL >50 mg/dL - - - - -  Calc  LDL <100 mg/dL - - - - -  Triglycerides <150 mg/dL - - - - -  Creatinine 0.6 - 1.1 mg/dL - 0.8 0.8 0.8 0.7   BP/Weight 03/25/2017 03/18/2017 03/12/2017 0/17/5102 11/19/5275  Systolic BP 824 235 361 443 154  Diastolic BP 80 86 86 85 89  Wt. (Lbs) 335.4 335 - 337.9 333.7  BMI 44.25 43.01 - 43.38 42.84   No flowsheet data found.  Kelly Mendez  reports that she quit smoking about 3 months ago. Her smoking use included Cigarettes. She has a 3.75 pack-year smoking history. She has never used smokeless tobacco. She reports that she drinks about 0.6 oz of alcohol per week . She reports that she does not use drugs.     Assessment & Plan:    Uncontrolled type 2 diabetes mellitus with complication, without long-term current use of insulin ([place]) - Plan: HgB A1c, Glucose (CBG), Fasting, Lipid panel, TSH, Dulaglutide (TRULICITY) 0.08 QP/6.1PJ SOPN  Morbid obesity ([facility]) - Plan: TSH  Anticoagulated  Acute deep vein thrombosis (DVT) of axillary vein of right upper extremity ([facility])  Malignant neoplasm of upper-inner quadrant of right breast in female, estrogen receptor negative ([facility])  Medication management - Plan: Lipid panel  Urinary frequency - Plan: POCT Urinalysis DIP (Proadvantage Device)  New onset type 2 diabetes mellitus ([place]) - Plan: metFORMIN (GLUCOPHAGE) 1000 MG tablet  1. Rx changes: add Trulicity. Hgb A1c 7.8  no evidence of hypoglycemia or FBS>200 2. Education: Reviewed 'ABCs' of diabetes management (respective goals in parentheses):  A1C (<7), blood pressure (<130/80), and cholesterol (LDL <100). 3. Compliance at present is estimated to be good. Efforts to improve compliance (if necessary) will be directed at dietary modifications: cut back on carbohydrates , increased exercise and regular blood sugar monitoring: two times daily. 4. She is starting radiation treatment and will continue visits with oncologist and OB/GYN. No signs of bleeding on Xarelto.  5. Check lipids and continue on statin.    6. Counseling done on weight loss and healthy lifestyle.  7. Follow up: 3 months she will need a foot exam. Up to date with diabetic eye exam. Consider Prevnar 13.

## 2017-03-26 NOTE — Progress Notes (Signed)
Location of Breast Cancer: Right Breast  Histology per Pathology Report:  10/16/16 Diagnosis 1. Breast, right, needle core biopsy, 1:00 o'clock - INVASIVE DUCTAL CARCINOMA. - SEE COMMENT. 2. Lymph node, needle/core biopsy, right axilla - THERE IS NO EVIDENCE OF CARCINOMA IN 1 OF 1 LYMPH NODE (0/1).  Receptor Status: ER(NEG), PR (NEG), Her2-neu (NEG), Ki-(80%)  03/18/17 Diagnosis 1. 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 - RESECTION MARGINS ARE NEGATIVE. - BIOPSY SITE. - SEE ONCOLOGY TABLE. 2. Lymph node, sentinel, biopsy, Right axillary - ONE OF ONE LYMPH NODE NEGATIVE FOR CARCINOMA (0/1). 3. Lymph node, sentinel, biopsy, Right axillary - ONE OF ONE LYMPH NODE NEGATIVE FOR CARCINOMA (0/1). 4. Lymph node, sentinel, biopsy, Right axillary - ONE OF ONE LYMPH NODE NEGATIVE FOR CARCINOMA (0/1). 5. Breast, excision, Right additional anterior margin - BENIGN FIBROADIPOSE TISSUE.  Did patient present with symptoms or was this found on screening mammography?: It was discovered on a screening mammogram.   Past/Anticipated interventions by surgeon, if any: 03/18/17 Procedure: Right breast seed localized partial mastectomy with right axillary deep sentinel lymph node mapping with methylene blue dye Surgeon: [name] M.D.  Past/Anticipated interventions by medical oncology, if any:  Dr. Jana Hakim last 03/06/17 --neoadjuvant chemotherapy to consist of doxorubicin and cyclophosphamide in dose dense fashion 4 starting 11/05/2016, received third cycle 12/03/2016 then proceeded to weekly Abraxane starting 01/01/2017             (a) cycle 4 of cyclophosphamide and doxorubicin given at end of Abraxane on 02/12/2017             (b) Abraxane given from 01/01/17-01/22/17 (4 cycles), stopped early due to peripheral neuropathy --extensive right upper extremity deep venous thrombosis documented 11/23/2016, treated initially with Lovenox             (a)  transitioned to rivaroxaban as of 11/30/2016             (b) total 6 months anticoagulation planned (one month beyond port removal) PLAN: Today we reviewed her repeat brain MRI in detail. It shows a significant with no not complete response. She understands she will need adjuvant radiation after surgery. Depending on the residual tumor burden we will consider whether or not she would benefit from sensitizing capecitabine. We are continuing the goserelin every 28 days as long as she finds it useful. Certainly at this point it has controlled her bleeding and is serving as a form of contraception. She is only having very minimal menopausal symptoms. She will see Dr. Isidore Moos the third week in September to start planning her radiation treatments. She will see me again late October. She knows to call for any problems that may develop before that visit.   Lymphedema issues, if any:  Patient states that she is having a little swelling and that it is tender to touch. Pain issues, if any:  Patient  States that she is having 4/10 breast in her breast area.  SAFETY ISSUES:  Prior radiation? NoPacemaker/ICD? No  Possible current pregnancy? Taking Goserelin every 28 days. She is having minimal menopausal symptoms.   Is the patient on methotrexate? No  Current Complaints / other details:  None Vitals:   04/01/17 1342  BP: (!) 143/99  Resp: 20  Temp: 98.8 F (37.1 C)  TempSrc: Oral  SpO2: 100%  Weight: (!) 333 lb (151 kg)   Wt Readings from Last 3 Encounters:  04/01/17 (!) 333 lb (151 kg)  03/25/17 (!) 335 lb  6.4 oz (152.1 kg)  03/18/17 (!) 335 lb (152 kg)       Malmfelt, Stephani Police, RN 03/26/2017,10:04 AM

## 2017-04-01 ENCOUNTER — Ambulatory Visit
Admission: RE | Admit: 2017-04-01 | Discharge: 2017-04-01 | Disposition: A | Discharge status: Home / Self Care | Payer: Managed Care, Other (non HMO) | Source: Ambulatory Visit | Attending: Radiation Oncology | Admitting: Radiation Oncology

## 2017-04-01 ENCOUNTER — Encounter: Payer: Self-pay | Admitting: Radiation Oncology

## 2017-04-01 VITALS — BP 143/99 | Temp 98.8°F | Resp 20 | Wt 333.0 lb

## 2017-04-01 DIAGNOSIS — C50211 Malignant neoplasm of upper-inner quadrant of right female breast: Secondary | ICD-10-CM

## 2017-04-01 DIAGNOSIS — Z803 Family history of malignant neoplasm of breast: Secondary | ICD-10-CM | POA: Diagnosis present

## 2017-04-01 DIAGNOSIS — Z171 Estrogen receptor negative status [ER-]: Secondary | ICD-10-CM

## 2017-04-01 HISTORY — DX: Acute embolism and thrombosis of unspecified deep veins of unspecified lower extremity: I82.409

## 2017-04-01 NOTE — Progress Notes (Signed)
Radiation Oncology         [phone]) [phone] ________________________________  Outpatient followup   Name: Kelly Mendez MRN: [id number]  Date: 04/01/2017  DOB: 18-Feb-1976  WU:XLKGMW, Laurian Brim, NP-C  Magrinat, Virgie Dad, MD   REFERRING PHYSICIAN: Magrinat, Virgie Dad, MD  DIAGNOSIS:    ICD-10-CM   1. Malignant neoplasm of upper-inner quadrant of right breast in female, estrogen receptor negative ([place]) C50.211    Z17.1   2. 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 female who was found to have a mass of the right breast found on her first mammogram ever on 10/07/16. Following this she had a bilateral diagnostic mammography with tomography and ultrasound performed which showed 0.9cm breast mass at the 1:00 radiant 18cm from the nipple. Ultrasound of the right axilla also showed a single indeterminate right axillary lymph node with borderline thickening of the anterior cortex. Biopsy which was performed on 10/16/16 which showed invasive ductal carcinoma with characteristics as described above in the diagnosis. She has followed up with Dr Jana Hakim since this and she was started on 4 cycles of Cyclophosphamide and Doxorubicin followed by Abraxane weekly x12. She completed this regimen overall very well. Her most recent breast MRI was performed on 03/02/17 prior to this and showed an interval decrease in size of her carcinoma following her treatment. She subsequently underwent right breast lumpectomy and sentinel lymph node sampling on 03/18/17. Surgical pathology from this showed grade 3 invasive ductal carcinoma (0.9 cm tumor). Resection margins were negative by 0.8cm and 3 of 3 nodes were deemed negative as well. She was then  referred to [place] from Dr Jana Hakim to discuss the possibly role of radiotherapy in assisting with the management of her disease.   Overall, she is mildly anxious but doing well and in good spirits. She presents today with her husband. She does note some tenderness around her incisional sites, but this has been improving.     PREVIOUS RADIATION THERAPY: No  PAST MEDICAL HISTORY:  has a past medical history of Abnormal Pap smear of cervix; Anxiety; Asthma; Cancer ([place]); Concussion; Depression; Diabetes type 2, uncontrolled ([place]); DVT (deep venous thrombosis) ([place]) (11/24/2016); Family history of breast cancer; Family history of neurofibromatosis; Family history of ovarian cancer; HPV in female; Hyperlipemia; and PTSD (post-traumatic stress disorder).    PAST SURGICAL HISTORY: Past Surgical History:  Procedure Laterality Date  . BREAST LUMPECTOMY WITH RADIOACTIVE SEED AND SENTINEL LYMPH NODE BIOPSY Right 03/18/2017   Procedure: RIGHT BREAST LUMPECTOMY WITH RADIOACTIVE SEED AND RIGHT SENTINEL LYMPH NODE BIOPSY;  Surgeon: [name], MD;  Location: [facility];  Service: General;  Laterality: Right;  . COLPOSCOPY    . left ovary removed  1978  . PORTACATH PLACEMENT Right 10/29/2016   Procedure: INSERTION PORT-A-CATH WITH [place];  Surgeon: [name], MD;  Location: [facility];  Service: General;  Laterality: Right;    FAMILY HISTORY: family history includes Brain cancer (age of onset: 30) in her maternal uncle; Breast cancer in her other; Breast cancer (age of onset: 74) in her mother; Cancer in her maternal aunt; Cervical cancer in her maternal aunt; Heart attack in her paternal grandfather; Hepatitis C in her father; Kidney failure in her paternal grandmother;  Lung cancer in her maternal grandfather; Neurofibromatosis in her maternal aunt, maternal aunt, maternal aunt, maternal uncle, and other; Ovarian cancer in her maternal aunt, maternal aunt, and maternal aunt.  SOCIAL HISTORY:   reports that she quit smoking about 4 months ago. Her smoking use included Cigarettes. She has a 3.75 pack-year smoking history. She has never used smokeless tobacco. She reports that she drinks about 0.6 oz of alcohol per week . She reports that she does not use drugs.  ALLERGIES: Penicillins  MEDICATIONS:  Current Outpatient Prescriptions  Medication Sig Dispense Refill  . atorvastatin (LIPITOR) 20 MG tablet Take 1 tablet (20 mg total) by mouth daily. 30 tablet 2  . Dulaglutide (TRULICITY) 4.69 GE/9.5MW SOPN Inject 1 application into the skin once a week. 5 pen 1  . ferrous sulfate 325 (65 FE) MG tablet Take 1 tablet (325 mg total) by mouth 2 (two) times daily with a meal. 60 tablet 3  . Glucose Blood (BLOOD GLUCOSE TEST STRIPS) STRP Test twice a day. Pt uses one touch verio flex meter 100 each 5  . goserelin (ZOLADEX) 10.8 MG injection Inject 10.8 mg into the skin every 28 (twenty-eight) days.    Marland Kitchen ibuprofen (ADVIL,MOTRIN) 200 MG tablet Take 400 mg by mouth daily as needed for headache.    . Melatonin 10 MG TABS Take 10 mg by mouth at bedtime.    . metFORMIN (GLUCOPHAGE) 1000 MG tablet TAKE 1 TABLET BY MOUTH TWICE DAILY WITH A MEAL 60 tablet 2  . ONETOUCH DELICA LANCETS FINE MISC Test twice a day 100 each 5  . rivaroxaban (XARELTO) 20 MG TABS tablet Take 1 tablet (20 mg total) by mouth daily with supper. 30 tablet 3  . oxyCODONE (OXY IR/ROXICODONE) 5 MG immediate release tablet Take 1-2 tablets (5-10 mg total) by mouth every 6 (six) hours as needed for severe pain. (Patient not taking: Reported on 03/25/2017) 20 tablet 0   No current facility-administered medications for this encounter.     REVIEW OF SYSTEMS: as above   PHYSICAL EXAM:  weight is 333 lb (151 kg) (abnormal). Her oral temperature is 98.8 F (37.1 C). Her blood pressure is 143/99 (abnormal). Her respiration is 20 and oxygen saturation is 100%.   General: Alert and oriented, in no acute distress HEENT: Head is normocephalic.  Extraocular movements are intact. Oropharynx is clear. Neck: Neck is supple, no palpable cervical or supraclavicular lymphadenopathy. Heart: Regular in rate and rhythm with no murmurs, rubs, or gallops. Chest: Clear to auscultation bilaterally, with no rhonchi, wheezes, or rales. Extremities: No cyanosis or edema. Lymphatics: see Neck Exam Skin: No concerning lesions. Psychiatric: Judgment and insight are intact. Affect is appropriate. Breasts: Lumpectomy and axillary scars are healing well in the right breast. No other palpable masses appreciated in the breasts or axillae.  ECOG = 1  0 - Asymptomatic (Fully active, able to carry on all predisease activities without restriction)  1 - Symptomatic but completely ambulatory (Restricted in physically strenuous activity but ambulatory and able to carry out work of a light or sedentary nature. For example, light housework, office work)  2 - Symptomatic, <50% in bed during the day (Ambulatory and capable of all self care but unable to carry out any work activities. Up and about more than 50% of waking hours)  3 - Symptomatic, >50% in bed, but not bedbound (Capable of only limited self-care, confined to bed or chair 50% or more of waking hours)  4 - Bedbound (Completely disabled. Cannot  carry on any self-care. Totally confined to bed or chair)  5 - Death   Eustace Pen MM, Creech RH, Tormey DC, et al. [phone]). "Toxicity and response criteria of the [facility]". [place] Oncol. 5 (6): 649-55   LABORATORY DATA:  Lab Results  Component Value Date   WBC 8.9 03/06/2017   HGB 11.8 03/06/2017   HCT 36.6 03/06/2017   MCV 81.7 03/06/2017   PLT 293 03/06/2017   CMP     Component Value Date/Time   NA 137 03/06/2017 1131   K 4.0 03/06/2017 1131   CL 107 12/18/2016 0500   CO2 23 03/06/2017 1131   GLUCOSE 205 (H) 03/06/2017 1131   BUN 8.3 03/06/2017 1131   CREATININE 0.8 03/06/2017 1131   CALCIUM 9.6 03/06/2017 1131   PROT  6.9 03/06/2017 1131   ALBUMIN 3.4 (L) 03/06/2017 1131   AST 18 03/06/2017 1131   ALT 21 03/06/2017 1131   ALKPHOS 97 03/06/2017 1131   BILITOT 0.33 03/06/2017 1131   GFRNONAA >60 12/18/2016 0500   GFRAA >60 12/18/2016 0500         RADIOGRAPHY: Mm Breast Surgical Specimen  Result Date: 03/18/2017 CLINICAL DATA:  Evaluate surgical specimen following excision of right breast cancer. EXAM: SPECIMEN RADIOGRAPH OF THE RIGHT BREAST COMPARISON:  Previous exam(s). FINDINGS: Status post excision of the right breast. The radioactive seed and biopsy marker clip are present, completely intact, and were marked for pathology. IMPRESSION: Specimen radiograph of the right breast. Electronically Signed   By: Margarette Canada M.D.   On: 03/18/2017 11:18   Mm Rt Radioactive Seed Loc Mammo Guide  Result Date: 03/17/2017 CLINICAL DATA:  Patient presents for seed localization prior to excision of a grade 3 invasive ductal carcinoma in the upper inner quadrant of the right breast. EXAM: MAMMOGRAPHIC GUIDED RADIOACTIVE SEED LOCALIZATION OF THE RIGHT BREAST COMPARISON:  Previous exam(s). FINDINGS: Patient presents for radioactive seed localization prior to lumpectomy. I met with the patient and we discussed the procedure of seed localization including benefits and alternatives. We discussed the high likelihood of a successful procedure. We discussed the risks of the procedure including infection, bleeding, tissue injury and further surgery. We discussed the low dose of radioactivity involved in the procedure. Informed, written consent was given. The usual time-out protocol was performed immediately prior to the procedure. Using mammographic guidance, sterile technique, 1% lidocaine and an I-125 radioactive seed, the ribbon shaped clip in the upper inner quadrant of the right breast was localized using a medial approach. The follow-up mammogram images confirm the seed in the expected location and were marked for Dr. Brantley Stage.  Follow-up survey of the patient confirms presence of the radioactive seed. Order number of I-125 seed:  [id number]. Total activity:  7.37 millicuries  Reference Date: 03/09/2017 The patient tolerated the procedure well and was released from the [facility]. She was given instructions regarding seed removal. IMPRESSION: Radioactive seed localization right breast. No apparent complications. Electronically Signed   By: Nolon Nations M.D.   On: 03/17/2017 16:53      IMPRESSION/PLAN:  Right breast cancer  We discussed adjuvant radiotherapy today.  I recommend radiotherapy to th right breast in order to decrease right of locoregional recurrence by 2/3.  The risks, benefits and side effects of this treatment were discussed in detail.  She understands that radiotherapy is associated with skin irritation and fatigue in the acute setting. Late effects can include cosmetic changes and rare injury to internal organs.  She is enthusiastic about proceeding with treatment. A consent form has been  signed and placed in her chart.  A total of 3 medically necessary complex treatment devices will be fabricated and supervised by me: 2 fields with MLCs for custom blocks to protect heart, and lungs;  and, a Vac-lok. MORE COMPLEX DEVICES MAY BE MADE IN DOSIMETRY FOR FIELD IN FIELD BEAMS FOR DOSE HOMOGENEITY.  I have requested : 3D Simulation which is medically necessary to give adequate dose to at risk tissues while sparing lungs and heart.  I have requested a DVH of the following structures: lungs, heart, right lumpectomy cavity.    The patient will receive 40.05 Gy in 15 fractions to the right breast with 2 fields.  This will be followed by a boost.  __________________________________________   Eppie Gibson, MD  This document serves as a record of services personally performed by Eppie Gibson, MD. It was created on his behalf by Reola Mosher, a trained medical scribe. The creation of this record is based on  the scribe's personal observations and the provider's statements to them. This document has been checked and approved by the attending provider.

## 2017-04-06 ENCOUNTER — Ambulatory Visit: Payer: Self-pay | Admitting: Surgery

## 2017-04-08 ENCOUNTER — Telehealth: Payer: Self-pay | Admitting: Family Medicine

## 2017-04-08 NOTE — Telephone Encounter (Signed)
Received requested eye report from [facility]. Per note from provider pt did not inform diabetic. Sending eye exam back for review.

## 2017-04-09 ENCOUNTER — Ambulatory Visit (HOSPITAL_BASED_OUTPATIENT_CLINIC_OR_DEPARTMENT_OTHER): Payer: Managed Care, Other (non HMO)

## 2017-04-09 ENCOUNTER — Telehealth: Payer: Self-pay | Admitting: Oncology

## 2017-04-09 VITALS — BP 132/78 | HR 85 | Resp 20

## 2017-04-09 DIAGNOSIS — N920 Excessive and frequent menstruation with regular cycle: Secondary | ICD-10-CM

## 2017-04-09 DIAGNOSIS — Z5111 Encounter for antineoplastic chemotherapy: Secondary | ICD-10-CM | POA: Diagnosis not present

## 2017-04-09 DIAGNOSIS — C50211 Malignant neoplasm of upper-inner quadrant of right female breast: Secondary | ICD-10-CM | POA: Diagnosis not present

## 2017-04-09 DIAGNOSIS — N9489 Other specified conditions associated with female genital organs and menstrual cycle: Secondary | ICD-10-CM

## 2017-04-09 MED ORDER — GOSERELIN ACETATE 3.6 MG ~~LOC~~ IMPL
3.6000 mg | DRUG_IMPLANT | Freq: Once | SUBCUTANEOUS | Status: AC
  Administered 2017-04-09: 3.6 mg via SUBCUTANEOUS
  Filled 2017-04-09: qty 3.6

## 2017-04-09 NOTE — Telephone Encounter (Signed)
Faxed office notes to [facility] of [facility]

## 2017-04-13 ENCOUNTER — Ambulatory Visit
Admission: RE | Admit: 2017-04-13 | Discharge: 2017-04-13 | Disposition: A | Discharge status: Home / Self Care | Payer: Managed Care, Other (non HMO) | Source: Ambulatory Visit | Attending: Radiation Oncology | Admitting: Radiation Oncology

## 2017-04-13 DIAGNOSIS — Z803 Family history of malignant neoplasm of breast: Secondary | ICD-10-CM | POA: Diagnosis not present

## 2017-04-13 DIAGNOSIS — C50211 Malignant neoplasm of upper-inner quadrant of right female breast: Secondary | ICD-10-CM

## 2017-04-13 DIAGNOSIS — Z171 Estrogen receptor negative status [ER-]: Secondary | ICD-10-CM

## 2017-04-13 NOTE — Progress Notes (Signed)
  Radiation Oncology         [phone]) [phone] ________________________________  Name: Kelly Mendez MRN: [id number]  Date: 04/13/2017  DOB: 01-Dec-1975  SIMULATION AND TREATMENT PLANNING NOTE    Outpatient  DIAGNOSIS:     ICD-10-CM   1. Malignant neoplasm of upper-inner quadrant of right breast in female, estrogen receptor negative ([place]) C50.211    Z17.1     NARRATIVE:  The patient was brought to the [facility].  Identity was confirmed.  All relevant records and images related to the planned course of therapy were reviewed.  The patient freely provided informed written consent to proceed with treatment after reviewing the details related to the planned course of therapy. The consent form was witnessed and verified by the simulation staff.    Then, the patient was set-up in a stable reproducible supine position for radiation therapy with her ipsilateral arm over her head, and her upper body secured in a custom-made Vac-lok device.  CT images were obtained.  Surface markings were placed.  The CT images were loaded into the planning software.    TREATMENT PLANNING NOTE: Treatment planning then occurred.  The radiation prescription was entered and confirmed.     A total of 3 medically necessary complex treatment devices were fabricated and supervised by me: 2 fields with MLCs for custom blocks to protect heart, and lungs;  and, a Vac-lok. MORE COMPLEX DEVICES MAY BE MADE IN DOSIMETRY FOR FIELD IN FIELD BEAMS FOR DOSE HOMOGENEITY.  I have requested : 3D Simulation which is medically necessary to give adequate dose to at risk tissues while sparing lungs and heart.  I have requested a DVH of the following structures: lungs, heart, right lumpectomy cavity.    The patient will receive 40.05 Gy in 15 fractions to the right breast with 2 tangential fields.   This will be followed by a boost.  Optical Surface Tracking Plan:  Since intensity modulated radiotherapy (IMRT) and 3D conformal  radiation treatment methods are predicated on accurate and precise positioning for treatment, intrafraction motion monitoring is medically necessary to ensure accurate and safe treatment delivery. The ability to quantify intrafraction motion without excessive ionizing radiation dose can only be performed with optical surface tracking. Accordingly, surface imaging offers the opportunity to obtain 3D measurements of patient position throughout IMRT and 3D treatments without excessive radiation exposure. I am ordering optical surface tracking for this patient's upcoming course of radiotherapy.  ________________________________   Reference:  Ursula Alert, J, et al. Surface imaging-based analysis of intrafraction motion for breast radiotherapy patients.Journal of [place], n. 6, nov. 2014. ISSN [id number].  Available at: <http://www.jacmp.org/index.php/jacmp/article/view/4957>.    -----------------------------------  Eppie Gibson, MD

## 2017-04-17 DIAGNOSIS — Z803 Family history of malignant neoplasm of breast: Secondary | ICD-10-CM | POA: Diagnosis not present

## 2017-04-20 ENCOUNTER — Ambulatory Visit
Admission: RE | Admit: 2017-04-20 | Discharge: 2017-04-20 | Disposition: A | Discharge status: Home / Self Care | Payer: Managed Care, Other (non HMO) | Source: Ambulatory Visit | Attending: Radiation Oncology | Admitting: Radiation Oncology

## 2017-04-20 DIAGNOSIS — Z510 Encounter for antineoplastic radiation therapy: Secondary | ICD-10-CM | POA: Diagnosis not present

## 2017-04-20 DIAGNOSIS — C50211 Malignant neoplasm of upper-inner quadrant of right female breast: Secondary | ICD-10-CM | POA: Insufficient documentation

## 2017-04-20 DIAGNOSIS — Z171 Estrogen receptor negative status [ER-]: Secondary | ICD-10-CM | POA: Diagnosis not present

## 2017-04-20 DIAGNOSIS — Z880 Allergy status to penicillin: Secondary | ICD-10-CM | POA: Insufficient documentation

## 2017-04-21 ENCOUNTER — Ambulatory Visit
Admission: RE | Admit: 2017-04-21 | Discharge: 2017-04-21 | Disposition: A | Discharge status: Home / Self Care | Payer: Managed Care, Other (non HMO) | Source: Ambulatory Visit | Attending: Radiation Oncology | Admitting: Radiation Oncology

## 2017-04-21 DIAGNOSIS — Z510 Encounter for antineoplastic radiation therapy: Secondary | ICD-10-CM | POA: Diagnosis not present

## 2017-04-22 ENCOUNTER — Ambulatory Visit
Admission: RE | Admit: 2017-04-22 | Discharge: 2017-04-22 | Disposition: A | Discharge status: Home / Self Care | Payer: Managed Care, Other (non HMO) | Source: Ambulatory Visit | Attending: Radiation Oncology | Admitting: Radiation Oncology

## 2017-04-22 DIAGNOSIS — Z510 Encounter for antineoplastic radiation therapy: Secondary | ICD-10-CM | POA: Diagnosis not present

## 2017-04-23 ENCOUNTER — Ambulatory Visit
Admission: RE | Admit: 2017-04-23 | Discharge: 2017-04-23 | Disposition: A | Discharge status: Home / Self Care | Payer: Managed Care, Other (non HMO) | Source: Ambulatory Visit | Attending: Radiation Oncology | Admitting: Radiation Oncology

## 2017-04-23 DIAGNOSIS — C50211 Malignant neoplasm of upper-inner quadrant of right female breast: Secondary | ICD-10-CM

## 2017-04-23 DIAGNOSIS — Z510 Encounter for antineoplastic radiation therapy: Secondary | ICD-10-CM | POA: Diagnosis not present

## 2017-04-23 DIAGNOSIS — Z171 Estrogen receptor negative status [ER-]: Secondary | ICD-10-CM

## 2017-04-23 MED ORDER — RADIAPLEXRX EX GEL
Freq: Once | CUTANEOUS | Status: AC
  Administered 2017-04-23: 16:00:00 via TOPICAL

## 2017-04-23 NOTE — Progress Notes (Signed)
Pt here for patient teaching.  Pt given Radiation and You booklet, skin care instructions and Radiaplex gel.  Reviewed areas of pertinence such as fatigue, skin changes, breast tenderness and breast swelling . Pt able to give teach back of to pat skin, use unscented/gentle soap and drink plenty of water,apply Radiaplex bid, avoid applying anything to skin within 4 hours of treatment, avoid wearing an under wire bra and to use an electric razor if they must shave. Pt verbalizes understanding of information given and will contact nursing with any questions or concerns.     Http://[email or web address]/treatmentinformation/whattoexpect/index      

## 2017-04-24 ENCOUNTER — Ambulatory Visit
Admission: RE | Admit: 2017-04-24 | Discharge: 2017-04-24 | Disposition: A | Discharge status: Home / Self Care | Payer: Managed Care, Other (non HMO) | Source: Ambulatory Visit | Attending: Radiation Oncology | Admitting: Radiation Oncology

## 2017-04-24 DIAGNOSIS — Z510 Encounter for antineoplastic radiation therapy: Secondary | ICD-10-CM | POA: Diagnosis not present

## 2017-04-27 ENCOUNTER — Ambulatory Visit
Admission: RE | Admit: 2017-04-27 | Discharge: 2017-04-27 | Disposition: A | Discharge status: Home / Self Care | Payer: Managed Care, Other (non HMO) | Source: Ambulatory Visit | Attending: Radiation Oncology | Admitting: Radiation Oncology

## 2017-04-27 DIAGNOSIS — Z510 Encounter for antineoplastic radiation therapy: Secondary | ICD-10-CM | POA: Diagnosis not present

## 2017-04-28 ENCOUNTER — Ambulatory Visit
Admission: RE | Admit: 2017-04-28 | Discharge: 2017-04-28 | Disposition: A | Discharge status: Home / Self Care | Payer: Managed Care, Other (non HMO) | Source: Ambulatory Visit | Attending: Radiation Oncology | Admitting: Radiation Oncology

## 2017-04-28 DIAGNOSIS — Z510 Encounter for antineoplastic radiation therapy: Secondary | ICD-10-CM | POA: Diagnosis not present

## 2017-04-29 ENCOUNTER — Ambulatory Visit
Admission: RE | Admit: 2017-04-29 | Discharge: 2017-04-29 | Disposition: A | Discharge status: Home / Self Care | Payer: Managed Care, Other (non HMO) | Source: Ambulatory Visit | Attending: Radiation Oncology | Admitting: Radiation Oncology

## 2017-04-29 DIAGNOSIS — Z510 Encounter for antineoplastic radiation therapy: Secondary | ICD-10-CM | POA: Diagnosis not present

## 2017-04-30 ENCOUNTER — Ambulatory Visit
Admission: RE | Admit: 2017-04-30 | Discharge: 2017-04-30 | Disposition: A | Discharge status: Home / Self Care | Payer: Managed Care, Other (non HMO) | Source: Ambulatory Visit | Attending: Radiation Oncology | Admitting: Radiation Oncology

## 2017-04-30 DIAGNOSIS — Z510 Encounter for antineoplastic radiation therapy: Secondary | ICD-10-CM | POA: Diagnosis not present

## 2017-05-01 ENCOUNTER — Ambulatory Visit
Admission: RE | Admit: 2017-05-01 | Discharge: 2017-05-01 | Disposition: A | Discharge status: Home / Self Care | Payer: Managed Care, Other (non HMO) | Source: Ambulatory Visit | Attending: Radiation Oncology | Admitting: Radiation Oncology

## 2017-05-01 DIAGNOSIS — Z510 Encounter for antineoplastic radiation therapy: Secondary | ICD-10-CM | POA: Diagnosis not present

## 2017-05-04 ENCOUNTER — Ambulatory Visit: Payer: Managed Care, Other (non HMO) | Admitting: Radiation Oncology

## 2017-05-04 ENCOUNTER — Ambulatory Visit
Admission: RE | Admit: 2017-05-04 | Discharge: 2017-05-04 | Disposition: A | Discharge status: Home / Self Care | Payer: Managed Care, Other (non HMO) | Source: Ambulatory Visit | Attending: Radiation Oncology | Admitting: Radiation Oncology

## 2017-05-04 DIAGNOSIS — Z510 Encounter for antineoplastic radiation therapy: Secondary | ICD-10-CM | POA: Diagnosis not present

## 2017-05-05 ENCOUNTER — Ambulatory Visit: Payer: Managed Care, Other (non HMO) | Admitting: Radiation Oncology

## 2017-05-05 ENCOUNTER — Ambulatory Visit
Admission: RE | Admit: 2017-05-05 | Discharge: 2017-05-05 | Disposition: A | Discharge status: Home / Self Care | Payer: Managed Care, Other (non HMO) | Source: Ambulatory Visit | Attending: Radiation Oncology | Admitting: Radiation Oncology

## 2017-05-05 DIAGNOSIS — Z510 Encounter for antineoplastic radiation therapy: Secondary | ICD-10-CM | POA: Diagnosis not present

## 2017-05-05 NOTE — Progress Notes (Signed)
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 This showed a possible mass in the right breast. On 10/13/2016 she underwent bilateral diagnostic mammography with tomography and right breast ultrasonography. This found the breast density to be category B. In the upper inner quadrant of the right breast there was a circumscribed mass which was barely palpable. Ultrasonography confirmed a 0.9 cm right breast mass at the 1:00 radiant 18 cm from the nipple ultrasound of the axilla showed 1 indeterminate right axillary lymph node with borderline thickening of the anterior cortex.  On 10/16/2016 the patient underwent biopsy of the right breast mass and the suspicious right axillary lymph node. The right breast mass proved to be an invasive ductal carcinoma, grade 3, estrogen and progesterone receptor negative, with an MIB-1 of 80%, and no HER-2 amplification, the signals ratio being 1.30 and the number per cell 1.75. The lymph node was negative and concordant.  Her subsequent history is as detailed below  INTERVAL HISTORY: Kelly Mendez returns today for follow-up and treatment of her triple negative breast cancer.  Since her last visit here she underwent right lumpectomy and sentinel lymph node sampling, on 03/18/2017. Final pathology ([id number]) showed residual invasive ductal carcinoma, grade 3, measuring 0.9 cm. All 3  sentinel lymph nodes were clear. Margins were negative. Repeat prognostic panel was again estrogen and progesterone receptor negative, and HER-2 nonamplified, with a signals ratio of 0.84 and the number per cell 2.10.  She then started adjuvant radiation, on which she continues. She also receives goserelin every 4 weeks.  REVIEW OF SYSTEMS: Kelly Mendez reports that tolerated surgery well and only experienced mild pain and tenderness afterwards. She no longer receives chemotherapy, but still experiences neuropathy in her feet. She is able to walk without any issues. Kelly Mendez has been receiving radiation and notes feeling fatigued, however, her symptoms are not as bad as when she was receiving chemo. She has some erythema and skin discoloration to her right side due to radiation. Kelly Mendez reports walking around a track three days a week, and is able to get in about 3-4 laps at a time. She denies unusual headaches, visual changes, nausea, vomiting, or dizziness. There has been no unusual cough, phlegm production, or pleurisy. This been no change in bowel or bladder habits. She denies unexplained fatigue or unexplained weight loss, bleeding, rash, or fever. A detailed review of systems was otherwise entirely stable.   PAST MEDICAL HISTORY: Past Medical History:  Diagnosis Date  . Abnormal Pap smear of cervix   . Anxiety   . Asthma   . Cancer ([place])   . Concussion    around age 20  . Depression   . Diabetes type 2, uncontrolled ([place])    new diagnosis in 08/2016  . DVT (deep venous thrombosis) ([place]) 11/24/2016   in rt arm  . Family history of breast cancer   . Family history of neurofibromatosis   . Family history of ovarian cancer   . HPV in female   .  Hyperlipemia   . PTSD (post-traumatic stress disorder)     PAST SURGICAL HISTORY: Past Surgical History:  Procedure Laterality Date  . BREAST LUMPECTOMY WITH RADIOACTIVE SEED AND SENTINEL LYMPH NODE BIOPSY Right 03/18/2017   Procedure: RIGHT BREAST LUMPECTOMY  WITH RADIOACTIVE SEED AND RIGHT SENTINEL LYMPH NODE BIOPSY;  Surgeon: [name], MD;  Location: [facility];  Service: General;  Laterality: Right;  . COLPOSCOPY    . left ovary removed  1978  . PORTACATH PLACEMENT Right 10/29/2016   Procedure: INSERTION PORT-A-CATH WITH [place];  Surgeon: [name], MD;  Location: [facility];  Service: General;  Laterality: Right;    FAMILY HISTORY Family History  Problem Relation Age of Onset  . Breast cancer Mother 57  . Hepatitis C Father   . Ovarian cancer Maternal Aunt        dx in her 44s  . Neurofibromatosis Maternal Uncle   . Brain cancer Maternal Uncle 58  . Lung cancer Maternal Grandfather   . Kidney failure Paternal Grandmother   . Heart attack Paternal Grandfather   . Ovarian cancer Maternal Aunt        dx in her 73s  . Neurofibromatosis Maternal Aunt   . Ovarian cancer Maternal Aunt   . Neurofibromatosis Maternal Aunt   . Cervical cancer Maternal Aunt   . Neurofibromatosis Maternal Aunt   . Cancer Maternal Aunt        cancer on the bottom of her foot  . Breast cancer Other        MGF's sisters  . Neurofibromatosis Other        MGM's paternal aunt  The patient's mother was diagnosed with breast cancer at age 22, but tells me the lump in her breast had been present for at least 10 years prior to that. She is now 73 and doing well. The patient's father died at the age of 50 from sepsis following liver transplantation. The patient has 2 brothers, no sisters. The patient tells me that she has at least 5 and sent cousins with breast cancer and there are other relatives with uterine cancer.  GYNECOLOGIC HISTORY:  No LMP recorded. Patient is not currently having periods (Reason: Other). Menarche age 25, first live birth age 60, the patient is GX P1. She has had multiple progesterone and oral contraceptive treatments through [facility] because of her menometrorrhagia. 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 Her husband [name] is a truck Geophysicist/field seismologist. The patient's daughter [name] is studying pre-veterinary medicine in college. [name] has 3 children, all boys, a keen is a cook in [place] and lives independently. The 2 younger boys, [name] and [name], aged 41 and 20 as of April 2018, are at home with the patient    ADVANCED DIRECTIVES: Not in place   HEALTH MAINTENANCE: Social History  Substance Use Topics  . Smoking status: Former Smoker    Packs/day: 0.15    Years: 25.00    Types: Cigarettes    Quit date: 11/25/2016  . Smokeless tobacco: Never Used  . Alcohol use 0.6 oz/week    1 Glasses of wine per week     Comment: rarely      Colonoscopy: n/a  PAP:  Bone density:   Allergies  Allergen Reactions  . Penicillins Other (See Comments)    As a child Has patient had a PCN reaction causing immediate rash, facial/tongue/throat swelling, SOB or lightheadedness with  hypotension: unknown Has patient had a PCN reaction causing severe rash involving mucus membranes or skin necrosis: unknown Has patient had a PCN reaction that required hospitalization unknown Has patient had a PCN reaction occurring within the last 10 years: no If all of the above answers are "NO", then may proceed with Cephalosporin use.     Current Outpatient Prescriptions  Medication Sig Dispense Refill  . atorvastatin (LIPITOR) 20 MG tablet Take 1 tablet (20 mg total) by mouth daily. 30 tablet 2  . Dulaglutide (TRULICITY) 8.41 YS/0.6TK SOPN Inject 1 application into the skin once a week. 5 pen 1  . ferrous sulfate 325 (65 FE) MG tablet Take 1 tablet (325 mg total) by mouth 2 (two) times daily with a meal. 60 tablet 3  . Glucose Blood (BLOOD GLUCOSE TEST STRIPS) STRP Test twice a day. Kelly Mendez uses one touch verio flex meter 100 each 5  . goserelin (ZOLADEX) 10.8 MG injection Inject 10.8 mg into the skin every 28 (twenty-eight) days.    Marland Kitchen  ibuprofen (ADVIL,MOTRIN) 200 MG tablet Take 400 mg by mouth daily as needed for headache.    . Melatonin 10 MG TABS Take 10 mg by mouth at bedtime.    . metFORMIN (GLUCOPHAGE) 1000 MG tablet TAKE 1 TABLET BY MOUTH TWICE DAILY WITH A MEAL 60 tablet 2  . ONETOUCH DELICA LANCETS FINE MISC Test twice a day 100 each 5  . oxyCODONE (OXY IR/ROXICODONE) 5 MG immediate release tablet Take 1-2 tablets (5-10 mg total) by mouth every 6 (six) hours as needed for severe pain. (Patient not taking: Reported on 03/25/2017) 20 tablet 0  . rivaroxaban (XARELTO) 20 MG TABS tablet Take 1 tablet (20 mg total) by mouth daily with supper. 30 tablet 3   No current facility-administered medications for this visit.    Facility-Administered Medications Ordered in Other Visits  Medication Dose Route Frequency Provider Last Rate Last Dose  . goserelin (ZOLADEX) injection 3.6 mg  3.6 mg Subcutaneous Once Nicholas Lose, MD        OBJECTIVE: Kelly Mendez who appears stated age  65:   05/07/17 1309  BP: (!) 155/111  Pulse: 90  Resp: 18  Temp: 98.6 F (37 C)  SpO2: 100%     Body mass index is 43.12 kg/m.    ECOG FS:1 - Symptomatic but completely ambulatory  Sclerae unicteric, EOMs intact Oropharynx clear and moist No cervical or supraclavicular adenopathy Lungs no rales or rhonchi Heart regular rate and rhythm Abd soft, nontender, positive bowel sounds MSK no focal spinal tenderness, no upper extremity lymphedema Neuro: nonfocal, well oriented, appropriate affect Breasts: The right breast is status post recent lumpectomy and is undergoing radiation. The cosmetic result is excellent. There is early desquamation in the inframammary fold. The left breast is unremarkable. Both axillae are benign.   LAB RESULTS:  CMP     Component Value Date/Time   NA 140 05/07/2017 1221   K 4.0 05/07/2017 1221   CL 107 12/18/2016 0500   CO2 24 05/07/2017 1221   GLUCOSE 173 (H) 05/07/2017 1221    BUN 6.7 (L) 05/07/2017 1221   CREATININE 0.8 05/07/2017 1221   CALCIUM 9.7 05/07/2017 1221   PROT 6.8 05/07/2017 1221   ALBUMIN 3.4 (L) 05/07/2017 1221   AST 21 05/07/2017 1221   ALT 24 05/07/2017 1221   ALKPHOS 131 05/07/2017 1221   BILITOT 0.48 05/07/2017 1221   GFRNONAA >60 12/18/2016 0500   GFRAA >60 12/18/2016 0500  No results found for: TOTALPROTELP, ALBUMINELP, A1GS, A2GS, BETS, BETA2SER, GAMS, MSPIKE, SPEI  No results found for: Nils Pyle, [facility]  Lab Results  Component Value Date   WBC 5.6 05/07/2017   NEUTROABS 4.6 05/07/2017   HGB 12.1 05/07/2017   HCT 38.7 05/07/2017   MCV 76.6 (L) 05/07/2017   PLT 258 05/07/2017      Chemistry      Component Value Date/Time   NA 140 05/07/2017 1221   K 4.0 05/07/2017 1221   CL 107 12/18/2016 0500   CO2 24 05/07/2017 1221   BUN 6.7 (L) 05/07/2017 1221   CREATININE 0.8 05/07/2017 1221      Component Value Date/Time   CALCIUM 9.7 05/07/2017 1221   ALKPHOS 131 05/07/2017 1221   AST 21 05/07/2017 1221   ALT 24 05/07/2017 1221   BILITOT 0.48 05/07/2017 1221       No results found for: LABCA2  No components found for: [id number]  No results for input(s): INR in the last 168 hours.  Urinalysis    Component Value Date/Time   LABSPEC 1.025 03/25/2017 1458   BILIRUBINUR negative 03/25/2017 1458   BILIRUBINUR n 09/29/2016 1045   KETONESUR negative 03/25/2017 1458   PROTEINUR negative 03/25/2017 1458   PROTEINUR trace 09/29/2016 1045   UROBILINOGEN negative 09/29/2016 1045   NITRITE Negative 03/25/2017 1458   NITRITE n 09/29/2016 1045   LEUKOCYTESUR Negative 03/25/2017 1458     STUDIES: Bilateral Breast MRI W WO, 03/02/2017, breast composition B, shows interval decrease in size of biopsy-proven right breast carcinoma.  ELIGIBLE FOR AVAILABLE RESEARCH PROTOCOL: not a candidate for PREVENT  ASSESSMENT: 41 y.o.  Kelly Mendez Mendez status post right breast upper inner quadrant biopsy 10/16/2016  for a clinical T1b pN0, stage IB invasive ductal carcinoma, grade 3, triple negative, with an MIB-1 of 80%.  (1)  genetics testing 12/01/2016 showed several variants of uncertain significance but no deleterious mutation  (2) neoadjuvant chemotherapy to consist of doxorubicin and cyclophosphamide in dose dense fashion 4 starting 11/05/2016, received third cycle 12/03/2016 then proceeded to weekly Abraxane starting 01/01/2017  (a) cycle 4 of cyclophosphamide and doxorubicin given at end of Abraxane on 02/12/2017  (b) Abraxane given from 01/01/17-01/22/17 (4 cycles), stopped early due to peripheral neuropathy  (3) status post right lumpectomy and sentinel lymph node sampling 03/18/2017 for a ypT1b ypN0 invasive ductal carcinoma, grade 3, with negative margins, and repeat prognostic panel again triple negative  (4) adjuvant radiation to be completed 05/18/2017  (5) extensive right upper extremity deep venous thrombosis documented 11/23/2016, treated initially with Lovenox  (a) transitioned to rivaroxaban as of 11/30/2016  (b) total 6 months anticoagulation planned (one month beyond port removal)  (6) tobacco abuse: The patient quit smoking 11/26/2016  (7) menometrorrhagia, with a cystic right adnexal lesion and a possible cervical polyp noted on ultrasound 12/16/2016, with benign endocervical curettage and endometrial biopsy 12/16/2016  (a) CA 125 12/17/2016 was 14.2 (normal).  (b) adnexal mass, likely benign, will be explored at the completion of chemotherapy  (c) goserelin started 12/16/2016, continued every 28 days     PLAN: Kelly Mendez did well with her surgery, and is tolerating radiation generally well.  She still having some peripheral neuropathy symptoms. Hopefully that will improve with time. I think she would benefit from gabapentin at bedtime and we discussed that today. She has a good understanding of the possible toxicities side effects and complications of that agent.  She is again  becoming iron deficient. The MCV has  dropped significantly. She has not been able to tolerate the oral iron well. I am setting her up for Feraheme next week and the week following. She has a good understanding of the possible toxicities, side effects and (rarely) severe complications from this agent.  She is tolerating Xarelto with no bleeding problems. She will have completed 6 months mid November. It may be prudent to continue into next year and I will repeat a d-dimer with her labs before the next visit here.  That will be mid-January. She knows to call for any problems that may develop before that visit.   Kelly Mendez, Virgie Dad, MD  05/07/17 1:36 PM Medical Oncology and Hematology [facility] [address] [place], [place] [address] Tel. [phone]    Fax. [phone]  This document serves as a record of services personally performed by Chauncey Cruel, MD. It was created on her behalf by Margit Banda, a trained medical scribe. The creation of this record is based on the scribe's personal observations and the provider's statements to them. This document has been checked and approved by the attending provider.

## 2017-05-06 ENCOUNTER — Ambulatory Visit
Admission: RE | Admit: 2017-05-06 | Discharge: 2017-05-06 | Disposition: A | Discharge status: Home / Self Care | Payer: Managed Care, Other (non HMO) | Source: Ambulatory Visit | Attending: Radiation Oncology | Admitting: Radiation Oncology

## 2017-05-06 DIAGNOSIS — Z510 Encounter for antineoplastic radiation therapy: Secondary | ICD-10-CM | POA: Diagnosis not present

## 2017-05-07 ENCOUNTER — Ambulatory Visit (HOSPITAL_BASED_OUTPATIENT_CLINIC_OR_DEPARTMENT_OTHER): Payer: Managed Care, Other (non HMO) | Admitting: Oncology

## 2017-05-07 ENCOUNTER — Other Ambulatory Visit (HOSPITAL_BASED_OUTPATIENT_CLINIC_OR_DEPARTMENT_OTHER): Payer: Managed Care, Other (non HMO)

## 2017-05-07 ENCOUNTER — Telehealth: Payer: Self-pay | Admitting: *Deleted

## 2017-05-07 ENCOUNTER — Ambulatory Visit (HOSPITAL_BASED_OUTPATIENT_CLINIC_OR_DEPARTMENT_OTHER): Payer: Managed Care, Other (non HMO)

## 2017-05-07 ENCOUNTER — Ambulatory Visit
Admission: RE | Admit: 2017-05-07 | Discharge: 2017-05-07 | Disposition: A | Discharge status: Home / Self Care | Payer: Managed Care, Other (non HMO) | Source: Ambulatory Visit | Attending: Radiation Oncology | Admitting: Radiation Oncology

## 2017-05-07 ENCOUNTER — Telehealth: Payer: Self-pay | Admitting: Oncology

## 2017-05-07 VITALS — BP 155/111 | HR 90 | Temp 98.6°F | Resp 18 | Ht 73.0 in | Wt 326.8 lb

## 2017-05-07 DIAGNOSIS — Z171 Estrogen receptor negative status [ER-]: Secondary | ICD-10-CM

## 2017-05-07 DIAGNOSIS — E611 Iron deficiency: Secondary | ICD-10-CM | POA: Diagnosis not present

## 2017-05-07 DIAGNOSIS — Z5111 Encounter for antineoplastic chemotherapy: Secondary | ICD-10-CM

## 2017-05-07 DIAGNOSIS — C50211 Malignant neoplasm of upper-inner quadrant of right female breast: Secondary | ICD-10-CM | POA: Diagnosis not present

## 2017-05-07 DIAGNOSIS — G629 Polyneuropathy, unspecified: Secondary | ICD-10-CM | POA: Diagnosis not present

## 2017-05-07 DIAGNOSIS — N9489 Other specified conditions associated with female genital organs and menstrual cycle: Secondary | ICD-10-CM

## 2017-05-07 DIAGNOSIS — D62 Acute posthemorrhagic anemia: Secondary | ICD-10-CM

## 2017-05-07 DIAGNOSIS — Z86718 Personal history of other venous thrombosis and embolism: Secondary | ICD-10-CM

## 2017-05-07 DIAGNOSIS — L539 Erythematous condition, unspecified: Secondary | ICD-10-CM

## 2017-05-07 DIAGNOSIS — N920 Excessive and frequent menstruation with regular cycle: Secondary | ICD-10-CM

## 2017-05-07 DIAGNOSIS — Z510 Encounter for antineoplastic radiation therapy: Secondary | ICD-10-CM | POA: Diagnosis not present

## 2017-05-07 LAB — COMPREHENSIVE METABOLIC PANEL
ALT: 24 U/L (ref 0–55)
AST: 21 U/L (ref 5–34)
Albumin: 3.4 g/dL — ABNORMAL LOW (ref 3.5–5.0)
Alkaline Phosphatase: 131 U/L (ref 40–150)
Anion Gap: 11 mEq/L (ref 3–11)
BUN: 6.7 mg/dL — ABNORMAL LOW (ref 7.0–26.0)
CO2: 24 mEq/L (ref 22–29)
Calcium: 9.7 mg/dL (ref 8.4–10.4)
Chloride: 105 mEq/L (ref 98–109)
Creatinine: 0.8 mg/dL (ref 0.6–1.1)
EGFR: >60 mL/min/{1.73_m2} (ref >60)
Glucose: 173 mg/dl — ABNORMAL HIGH (ref 70–140)
Potassium: 4 mEq/L (ref 3.5–5.1)
Sodium: 140 mEq/L (ref 136–145)
Total Bilirubin: 0.48 mg/dL (ref 0.20–1.20)
Total Protein: 6.8 g/dL (ref 6.4–8.3)

## 2017-05-07 LAB — CBC WITH DIFFERENTIAL/PLATELET
BASO%: 0.2 % (ref 0.0–2.0)
Basophils Absolute: 0 10*3/uL (ref 0.0–0.1)
EOS%: 2.2 % (ref 0.0–7.0)
Eosinophils Absolute: 0.1 10*3/uL (ref 0.0–0.5)
HCT: 38.7 % (ref 34.8–46.6)
HGB: 12.1 g/dL (ref 11.6–15.9)
LYMPH%: 9 % — ABNORMAL LOW (ref 14.0–49.7)
MCH: 24 pg — ABNORMAL LOW (ref 25.1–34.0)
MCHC: 31.3 g/dL — ABNORMAL LOW (ref 31.5–36.0)
MCV: 76.6 fL — ABNORMAL LOW (ref 79.5–101.0)
MONO#: 0.3 10*3/uL (ref 0.1–0.9)
MONO%: 5.4 % (ref 0.0–14.0)
NEUT#: 4.6 10*3/uL (ref 1.5–6.5)
NEUT%: 83.2 % — ABNORMAL HIGH (ref 38.4–76.8)
Platelets: 258 10*3/uL (ref 145–400)
RBC: 5.05 10*6/uL (ref 3.70–5.45)
RDW: 15.6 % — ABNORMAL HIGH (ref 11.2–14.5)
WBC: 5.6 10*3/uL (ref 3.9–10.3)
lymph#: 0.5 10*3/uL — ABNORMAL LOW (ref 0.9–3.3)

## 2017-05-07 MED ORDER — GABAPENTIN 300 MG PO CAPS: 300.0000 mg | ORAL_CAPSULE | Freq: Every day | ORAL | 4 refills | Status: DC

## 2017-05-07 MED ORDER — GOSERELIN ACETATE 3.6 MG ~~LOC~~ IMPL
3.6000 mg | DRUG_IMPLANT | Freq: Once | SUBCUTANEOUS | Status: AC
  Administered 2017-05-07: 3.6 mg via SUBCUTANEOUS
  Filled 2017-05-07: qty 3.6

## 2017-05-07 NOTE — Patient Instructions (Signed)
Goserelin injection What is this medicine? GOSERELIN (GOE se rel in) is similar to a hormone found in the body. It lowers the amount of sex hormones that the body makes. Men will have lower testosterone levels and women will have lower estrogen levels while taking this medicine. In men, this medicine is used to treat prostate cancer; the injection is either given once per month or once every 12 weeks. A once per month injection (only) is used to treat women with endometriosis, dysfunctional uterine bleeding, or advanced breast cancer. This medicine may be used for other purposes; ask your health care provider or pharmacist if you have questions. COMMON BRAND NAME(S): Zoladex What should I tell my health care provider before I take this medicine? They need to know if you have any of these conditions (some only apply to women): -diabetes -heart disease or previous heart attack -high blood pressure -high cholesterol -kidney disease -osteoporosis or low bone density -problems passing urine -spinal cord injury -stroke -tobacco smoker -an unusual or allergic reaction to goserelin, hormone therapy, other medicines, foods, dyes, or preservatives -pregnant or trying to get pregnant -breast-feeding How should I use this medicine? This medicine is for injection under the skin. It is given by a health care professional in a hospital or clinic setting. Men receive this injection once every 4 weeks or once every 12 weeks. Women will only receive the once every 4 weeks injection. Talk to your pediatrician regarding the use of this medicine in children. Special care may be needed. Overdosage: If you think you have taken too much of this medicine contact a poison control center or emergency room at once. NOTE: This medicine is only for you. Do not share this medicine with others. What if I miss a dose? It is important not to miss your dose. Call your doctor or health care professional if you are unable to  keep an appointment. What may interact with this medicine? -female hormones like estrogen -herbal or dietary supplements like black cohosh, chasteberry, or DHEA -female hormones like testosterone -prasterone This list may not describe all possible interactions. Give your health care provider a list of all the medicines, herbs, non-prescription drugs, or dietary supplements you use. Also tell them if you smoke, drink alcohol, or use illegal drugs. Some items may interact with your medicine. What should I watch for while using this medicine? Visit your doctor or health care professional for regular checks on your progress. Your symptoms may appear to get worse during the first weeks of this therapy. Tell your doctor or healthcare professional if your symptoms do not start to get better or if they get worse after this time. Your bones may get weaker if you take this medicine for a long time. If you smoke or frequently drink alcohol you may increase your risk of bone loss. A family history of osteoporosis, chronic use of drugs for seizures (convulsions), or corticosteroids can also increase your risk of bone loss. Talk to your doctor about how to keep your bones strong. This medicine should stop regular monthly menstration in women. Tell your doctor if you continue to menstrate. Women should not become pregnant while taking this medicine or for 12 weeks after stopping this medicine. Women should inform their doctor if they wish to become pregnant or think they might be pregnant. There is a potential for serious side effects to an unborn child. Talk to your health care professional or pharmacist for more information. Do not breast-feed an infant while taking   this medicine. Men should inform their doctors if they wish to father a child. This medicine may lower sperm counts. Talk to your health care professional or pharmacist for more information. What side effects may I notice from receiving this  medicine? Side effects that you should report to your doctor or health care professional as soon as possible: -allergic reactions like skin rash, itching or hives, swelling of the face, lips, or tongue -bone pain -breathing problems -changes in vision -chest pain -feeling faint or lightheaded, falls -fever, chills -pain, swelling, warmth in the leg -pain, tingling, numbness in the hands or feet -signs and symptoms of low blood pressure like dizziness; feeling faint or lightheaded, falls; unusually weak or tired -stomach pain -swelling of the ankles, feet, hands -trouble passing urine or change in the amount of urine -unusually high or low blood pressure -unusually weak or tired Side effects that usually do not require medical attention (report to your doctor or health care professional if they continue or are bothersome): -change in sex drive or performance -changes in breast size in both males and females -changes in emotions or moods -headache -hot flashes -irritation at site where injected -loss of appetite -skin problems like acne, dry skin -vaginal dryness This list may not describe all possible side effects. Call your doctor for medical advice about side effects. You may report side effects to FDA at 1-800-FDA-1088. Where should I keep my medicine? This drug is given in a hospital or clinic and will not be stored at home. NOTE: This sheet is a summary. It may not cover all possible information. If you have questions about this medicine, talk to your doctor, pharmacist, or health care provider.  2018 Elsevier/Gold Standard (2013-09-06 11:10:35)  

## 2017-05-07 NOTE — Telephone Encounter (Signed)
Patient stopped by scheduling and wanted to change her appt, advised scheduling that I would call the patient after clinic. Patient requested an earlier appt than November 19th. Called the patient back and explained that "we have no available appt until November 19th, but we have your surgery date of December 4th blocked." Patient was ok with keeping the November 19th and December 4th appts.

## 2017-05-07 NOTE — Telephone Encounter (Signed)
Appointments complete per 10/25 los. Discussed with patient. Patient declined printout - uses mychart.

## 2017-05-08 ENCOUNTER — Ambulatory Visit
Admission: RE | Admit: 2017-05-08 | Discharge: 2017-05-08 | Disposition: A | Discharge status: Home / Self Care | Payer: Managed Care, Other (non HMO) | Source: Ambulatory Visit | Attending: Radiation Oncology | Admitting: Radiation Oncology

## 2017-05-08 DIAGNOSIS — Z510 Encounter for antineoplastic radiation therapy: Secondary | ICD-10-CM | POA: Diagnosis not present

## 2017-05-11 ENCOUNTER — Telehealth: Payer: Self-pay | Admitting: *Deleted

## 2017-05-11 ENCOUNTER — Ambulatory Visit
Admission: RE | Admit: 2017-05-11 | Discharge: 2017-05-11 | Disposition: A | Discharge status: Home / Self Care | Payer: Managed Care, Other (non HMO) | Source: Ambulatory Visit | Attending: Radiation Oncology | Admitting: Radiation Oncology

## 2017-05-11 DIAGNOSIS — Z510 Encounter for antineoplastic radiation therapy: Secondary | ICD-10-CM | POA: Diagnosis not present

## 2017-05-11 NOTE — Telephone Encounter (Signed)
Disability form received via fax from [place] completed per office protocol with this RN attempting to contact the patient for clarification of information including- intermittant or long term , as well as first date of when she was out of work related to current diagnosis.  VM left to return call with above information.

## 2017-05-12 ENCOUNTER — Other Ambulatory Visit: Payer: Self-pay | Admitting: Family Medicine

## 2017-05-12 ENCOUNTER — Encounter: Payer: Self-pay | Admitting: *Deleted

## 2017-05-12 ENCOUNTER — Encounter: Payer: Self-pay | Admitting: Family Medicine

## 2017-05-12 ENCOUNTER — Ambulatory Visit: Payer: Managed Care, Other (non HMO)

## 2017-05-12 MED ORDER — ERYTHROMYCIN 5 MG/GM OP OINT: 1.0000 "application " | TOPICAL_OINTMENT | Freq: Three times a day (TID) | OPHTHALMIC | 0 refills | Status: DC

## 2017-05-12 NOTE — Progress Notes (Signed)
[place] Psychosocial Distress Screening Clinical Social Work  Clinical Social Work was referred by distress screening protocol.  The patient scored a 7 on the Psychosocial Distress Thermometer which indicates moderate distress. Clinical Social Worker contacted patient at home to offer support and assess for distress and other psychosocial needs.  Patient stated she was doing "ok".  Patient reported transportation as a concern.  CSW and patient discussed transportation resources.  Patient plans to see CSW team member after her appointment tomorrow to discuss SCAT and ACS-road to Recovery.     [facility] DISTRESS SCREENING 04/01/2017  Screening Type Initial Screening  Distress experienced in past week (1-10) 7  Practical problem type Insurance;Transportation  Emotional problem type   Physical Problem type Sleep/insomnia;Tingling hands/feet  Referral to support programs      Johnnye Lana, MSW, LCSW, OSW-C Clinical Social Worker [facility] [phone]

## 2017-05-12 NOTE — Progress Notes (Signed)
   Subjective:    Patient ID: Kelly Mendez, female    DOB: 09-Sep-1975, 41 y.o.   MRN: [id number]  HPI    Review of Systems     Objective:   Physical Exam        Assessment & Plan:

## 2017-05-13 ENCOUNTER — Ambulatory Visit: Payer: Managed Care, Other (non HMO)

## 2017-05-13 ENCOUNTER — Telehealth: Payer: Self-pay

## 2017-05-13 NOTE — Telephone Encounter (Signed)
I was informed by [facility] that Kelly Mendez missed her radiation treatment again today. I called and spoke to her, she continues to feel poorly. She has a sore throat with voice hoarseness. She told me that she plans to see her PCP tomorrow. She does not believe she will be here for her radiation treatment again tomorrow. I spoke to her about the importance of coming on a regular basis for her radiation treatment. I have asked her to keep [place] updated on her condition and she agreed.

## 2017-05-14 ENCOUNTER — Ambulatory Visit: Payer: Managed Care, Other (non HMO)

## 2017-05-14 ENCOUNTER — Ambulatory Visit
Admission: RE | Admit: 2017-05-14 | Discharge: 2017-05-14 | Disposition: A | Discharge status: Home / Self Care | Payer: Managed Care, Other (non HMO) | Source: Ambulatory Visit | Attending: Radiation Oncology | Admitting: Radiation Oncology

## 2017-05-14 DIAGNOSIS — Z510 Encounter for antineoplastic radiation therapy: Secondary | ICD-10-CM | POA: Diagnosis not present

## 2017-05-15 ENCOUNTER — Ambulatory Visit: Payer: Managed Care, Other (non HMO)

## 2017-05-15 ENCOUNTER — Ambulatory Visit
Admission: RE | Admit: 2017-05-15 | Discharge: 2017-05-15 | Disposition: A | Discharge status: Home / Self Care | Payer: Managed Care, Other (non HMO) | Source: Ambulatory Visit | Attending: Radiation Oncology | Admitting: Radiation Oncology

## 2017-05-15 DIAGNOSIS — Z510 Encounter for antineoplastic radiation therapy: Secondary | ICD-10-CM | POA: Diagnosis not present

## 2017-05-16 ENCOUNTER — Other Ambulatory Visit: Payer: Self-pay | Admitting: Oncology

## 2017-05-18 ENCOUNTER — Ambulatory Visit
Admission: RE | Admit: 2017-05-18 | Discharge: 2017-05-18 | Disposition: A | Discharge status: Home / Self Care | Payer: Managed Care, Other (non HMO) | Source: Ambulatory Visit | Attending: Radiation Oncology | Admitting: Radiation Oncology

## 2017-05-18 ENCOUNTER — Ambulatory Visit: Payer: Managed Care, Other (non HMO)

## 2017-05-18 DIAGNOSIS — Z171 Estrogen receptor negative status [ER-]: Secondary | ICD-10-CM

## 2017-05-18 DIAGNOSIS — C50211 Malignant neoplasm of upper-inner quadrant of right female breast: Secondary | ICD-10-CM

## 2017-05-18 DIAGNOSIS — Z510 Encounter for antineoplastic radiation therapy: Secondary | ICD-10-CM | POA: Diagnosis not present

## 2017-05-18 MED ORDER — SONAFINE EX EMUL
1.0000 "application " | Freq: Two times a day (BID) | CUTANEOUS | Status: DC
  Administered 2017-05-18: 1 via TOPICAL

## 2017-05-19 ENCOUNTER — Ambulatory Visit
Admission: RE | Admit: 2017-05-19 | Discharge: 2017-05-19 | Disposition: A | Discharge status: Home / Self Care | Payer: Managed Care, Other (non HMO) | Source: Ambulatory Visit | Attending: Radiation Oncology | Admitting: Radiation Oncology

## 2017-05-19 ENCOUNTER — Ambulatory Visit: Payer: Managed Care, Other (non HMO)

## 2017-05-19 ENCOUNTER — Telehealth: Payer: Self-pay | Admitting: *Deleted

## 2017-05-19 DIAGNOSIS — Z510 Encounter for antineoplastic radiation therapy: Secondary | ICD-10-CM | POA: Diagnosis not present

## 2017-05-19 NOTE — Telephone Encounter (Signed)
Printed FMLA paperwork that was emailed to this RN by the pt and placed in FMLA pick up box.

## 2017-05-20 ENCOUNTER — Encounter: Payer: Self-pay | Admitting: Radiation Oncology

## 2017-05-20 ENCOUNTER — Ambulatory Visit (HOSPITAL_BASED_OUTPATIENT_CLINIC_OR_DEPARTMENT_OTHER): Payer: Managed Care, Other (non HMO)

## 2017-05-20 ENCOUNTER — Encounter: Payer: Self-pay | Admitting: *Deleted

## 2017-05-20 ENCOUNTER — Ambulatory Visit
Admission: RE | Admit: 2017-05-20 | Discharge: 2017-05-20 | Disposition: A | Discharge status: Home / Self Care | Payer: Managed Care, Other (non HMO) | Source: Ambulatory Visit | Attending: Radiation Oncology | Admitting: Radiation Oncology

## 2017-05-20 VITALS — BP 129/83 | HR 90 | Temp 98.6°F

## 2017-05-20 DIAGNOSIS — Z510 Encounter for antineoplastic radiation therapy: Secondary | ICD-10-CM | POA: Diagnosis not present

## 2017-05-20 DIAGNOSIS — N9489 Other specified conditions associated with female genital organs and menstrual cycle: Secondary | ICD-10-CM

## 2017-05-20 DIAGNOSIS — E611 Iron deficiency: Secondary | ICD-10-CM | POA: Diagnosis not present

## 2017-05-20 DIAGNOSIS — N920 Excessive and frequent menstruation with regular cycle: Secondary | ICD-10-CM

## 2017-05-20 MED ORDER — SODIUM CHLORIDE 0.9 % IV SOLN
510.0000 mg | Freq: Once | INTRAVENOUS | Status: AC
  Administered 2017-05-20: 510 mg via INTRAVENOUS
  Filled 2017-05-20: qty 17

## 2017-05-20 NOTE — Patient Instructions (Signed)

## 2017-05-22 NOTE — Progress Notes (Signed)
  Radiation Oncology         [phone]) [phone] ________________________________  Name: Kelly Mendez MRN: [id number]  Date: 05/20/2017  DOB: Dec 10, 1975  End of Treatment Note  Diagnosis:   41 y.o. 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  Beams/energy:    1. Primary: 3D // 15X, 10X Photon 2. Boost: electron  // 12E, 15E   Narrative: The patient tolerated radiation treatment relatively well.   She experienced mild fatigue and reported pain about a level 5 with burning underneath the right breast. She had some hyperpigmentation especially at the IM fold of the right breast with resolving moist desquamation. She is using radiaplex and neosporin as directed.  Plan: The patient has completed radiation treatment. The patient will return to radiation oncology clinic for routine followup in one month. I advised them to call or return sooner if they have any questions or concerns related to their recovery or treatment.  -----------------------------------  Eppie Gibson, MD  This document serves as a record of services personally performed by Eppie Gibson, MD. It was created on her behalf by Rae Lips, a trained medical scribe. The creation of this record is based on the scribe's personal observations and the provider's statements to them. This document has been checked and approved by the attending provider.

## 2017-05-24 ENCOUNTER — Other Ambulatory Visit: Payer: Self-pay | Admitting: Family Medicine

## 2017-05-24 DIAGNOSIS — E118 Type 2 diabetes mellitus with unspecified complications: Principal | ICD-10-CM

## 2017-05-24 DIAGNOSIS — E1165 Type 2 diabetes mellitus with hyperglycemia: Secondary | ICD-10-CM

## 2017-05-24 DIAGNOSIS — IMO0002 Reserved for concepts with insufficient information to code with codable children: Secondary | ICD-10-CM

## 2017-05-25 ENCOUNTER — Telehealth: Payer: Self-pay | Admitting: Oncology

## 2017-05-25 NOTE — Telephone Encounter (Signed)
05/25/2017 @ 12:02 pm left message @ [phone] for patient informing them their Disability was completed and attempted to fax to Maralyn Sago @ [phone] 3 times with no success.  On cover sheet patient requested a copy be mailed.  I put in this afternoon's mail.

## 2017-05-26 ENCOUNTER — Encounter: Payer: Self-pay | Admitting: Family Medicine

## 2017-05-27 ENCOUNTER — Other Ambulatory Visit: Payer: Self-pay | Admitting: Gynecologic Oncology

## 2017-05-27 NOTE — Progress Notes (Signed)
Please place orders in Epic as patient is being scheduled for a pre-op appointment! Thank you! 

## 2017-05-29 ENCOUNTER — Other Ambulatory Visit: Payer: Self-pay | Admitting: Gynecologic Oncology

## 2017-05-29 DIAGNOSIS — N9489 Other specified conditions associated with female genital organs and menstrual cycle: Secondary | ICD-10-CM

## 2017-06-01 ENCOUNTER — Ambulatory Visit (HOSPITAL_BASED_OUTPATIENT_CLINIC_OR_DEPARTMENT_OTHER): Payer: Managed Care, Other (non HMO)

## 2017-06-01 ENCOUNTER — Other Ambulatory Visit: Payer: Self-pay | Admitting: Oncology

## 2017-06-01 ENCOUNTER — Encounter: Payer: Self-pay | Admitting: Gynecologic Oncology

## 2017-06-01 ENCOUNTER — Ambulatory Visit: Payer: Managed Care, Other (non HMO) | Attending: Gynecologic Oncology | Admitting: Gynecologic Oncology

## 2017-06-01 ENCOUNTER — Other Ambulatory Visit (HOSPITAL_BASED_OUTPATIENT_CLINIC_OR_DEPARTMENT_OTHER): Payer: Managed Care, Other (non HMO)

## 2017-06-01 VITALS — BP 124/85 | HR 82 | Temp 98.8°F | Resp 17

## 2017-06-01 VITALS — BP 134/79 | HR 84 | Temp 98.6°F | Resp 20 | Wt 324.8 lb

## 2017-06-01 DIAGNOSIS — D62 Acute posthemorrhagic anemia: Secondary | ICD-10-CM

## 2017-06-01 DIAGNOSIS — N83201 Unspecified ovarian cyst, right side: Secondary | ICD-10-CM | POA: Insufficient documentation

## 2017-06-01 DIAGNOSIS — Z923 Personal history of irradiation: Secondary | ICD-10-CM | POA: Diagnosis not present

## 2017-06-01 DIAGNOSIS — Z171 Estrogen receptor negative status [ER-]: Secondary | ICD-10-CM | POA: Insufficient documentation

## 2017-06-01 DIAGNOSIS — Z86718 Personal history of other venous thrombosis and embolism: Secondary | ICD-10-CM

## 2017-06-01 DIAGNOSIS — N9489 Other specified conditions associated with female genital organs and menstrual cycle: Secondary | ICD-10-CM

## 2017-06-01 DIAGNOSIS — N949 Unspecified condition associated with female genital organs and menstrual cycle: Secondary | ICD-10-CM

## 2017-06-01 DIAGNOSIS — Z7984 Long term (current) use of oral hypoglycemic drugs: Secondary | ICD-10-CM | POA: Diagnosis not present

## 2017-06-01 DIAGNOSIS — F329 Major depressive disorder, single episode, unspecified: Secondary | ICD-10-CM | POA: Insufficient documentation

## 2017-06-01 DIAGNOSIS — Z9079 Acquired absence of other genital organ(s): Secondary | ICD-10-CM | POA: Insufficient documentation

## 2017-06-01 DIAGNOSIS — Z853 Personal history of malignant neoplasm of breast: Secondary | ICD-10-CM

## 2017-06-01 DIAGNOSIS — N920 Excessive and frequent menstruation with regular cycle: Secondary | ICD-10-CM

## 2017-06-01 DIAGNOSIS — J45909 Unspecified asthma, uncomplicated: Secondary | ICD-10-CM | POA: Insufficient documentation

## 2017-06-01 DIAGNOSIS — Z87891 Personal history of nicotine dependence: Secondary | ICD-10-CM | POA: Diagnosis not present

## 2017-06-01 DIAGNOSIS — F4310 Post-traumatic stress disorder, unspecified: Secondary | ICD-10-CM | POA: Diagnosis not present

## 2017-06-01 DIAGNOSIS — E119 Type 2 diabetes mellitus without complications: Secondary | ICD-10-CM | POA: Insufficient documentation

## 2017-06-01 DIAGNOSIS — C50919 Malignant neoplasm of unspecified site of unspecified female breast: Secondary | ICD-10-CM | POA: Insufficient documentation

## 2017-06-01 DIAGNOSIS — Z7901 Long term (current) use of anticoagulants: Secondary | ICD-10-CM

## 2017-06-01 DIAGNOSIS — F419 Anxiety disorder, unspecified: Secondary | ICD-10-CM | POA: Diagnosis not present

## 2017-06-01 DIAGNOSIS — Z9221 Personal history of antineoplastic chemotherapy: Secondary | ICD-10-CM | POA: Diagnosis not present

## 2017-06-01 DIAGNOSIS — E611 Iron deficiency: Secondary | ICD-10-CM | POA: Diagnosis not present

## 2017-06-01 DIAGNOSIS — Z79899 Other long term (current) drug therapy: Secondary | ICD-10-CM | POA: Diagnosis not present

## 2017-06-01 DIAGNOSIS — Z90721 Acquired absence of ovaries, unilateral: Secondary | ICD-10-CM | POA: Diagnosis not present

## 2017-06-01 DIAGNOSIS — Z803 Family history of malignant neoplasm of breast: Secondary | ICD-10-CM | POA: Insufficient documentation

## 2017-06-01 MED ORDER — SODIUM CHLORIDE 0.9 % IV SOLN
510.0000 mg | Freq: Once | INTRAVENOUS | Status: AC
  Administered 2017-06-01: 510 mg via INTRAVENOUS
  Filled 2017-06-01: qty 17

## 2017-06-01 NOTE — Patient Instructions (Signed)

## 2017-06-01 NOTE — Patient Instructions (Signed)
STOP TAKING XARELTO ONE WEEK BEFORE SURGERY (Last dose Nov 26).                Preparing for your Surgery  Plan for surgery on June 16, 2017 with Dr. [name] at [facility] will be scheduled for a robotic total hysterectomy, bilateral salpingectomy, right oophorectomy.  Pre-operative Testing -You will receive a phone call from presurgical testing at [facility] to arrange for a pre-operative testing appointment before your surgery.  This appointment normally occurs one to two weeks before your scheduled surgery.   -Bring your insurance card, copy of an advanced directive if applicable, medication list  -At that visit, you will be asked to sign a consent for a possible blood transfusion in case a transfusion becomes necessary during surgery.  The need for a blood transfusion is rare but having consent is a necessary part of your care.     -You should not be taking blood thinners or aspirin at least ten days prior to surgery unless instructed by your surgeon.  Day Before Surgery at [place] will be asked to take in a light diet the day before surgery.  Avoid carbonated beverages.  You will be advised to have nothing to eat or drink after midnight the evening before.    Eat a light diet the day before surgery.  Examples including soups, broths, toast, yogurt, mashed potatoes.  Things to avoid include carbonated beverages (fizzy beverages), raw fruits and raw vegetables, or beans.   If your bowels are filled with gas, your surgeon will have difficulty visualizing your pelvic organs which increases your surgical risks.  Your role in recovery Your role is to become active as soon as directed by your doctor, while still giving yourself time to heal.  Rest when you feel tired. You will be asked to do the following in order to speed your recovery:  - Cough and breathe deeply. This helps toclear and expand your lungs and can prevent pneumonia. You may be given a  spirometer to practice deep breathing. A staff member will show you how to use the spirometer. - Do mild physical activity. Walking or moving your legs help your circulation and body functions return to normal. A staff member will help you when you try to walk and will provide you with simple exercises. Do not try to get up or walk alone the first time. - Actively manage your pain. Managing your pain lets you move in comfort. We will ask you to rate your pain on a scale of zero to 10. It is your responsibility to tell your doctor or nurse where and how much you hurt so your pain can be treated.  Special Considerations -If you are diabetic, you may be placed on insulin after surgery to have closer control over your blood sugars to promote healing and recovery.  This does not mean that you will be discharged on insulin.  If applicable, your oral antidiabetics will be resumed when you are tolerating a solid diet.  -Your final pathology results from surgery should be available by the Friday after surgery and the results will be relayed to you when available.  -Dr. Lahoma Crocker is the Surgeon that assists your GYN Oncologist with surgery.  The next day after your surgery you will either see your GYN Oncologist or Dr. Lahoma Crocker.   Blood Transfusion Information WHAT IS A BLOOD TRANSFUSION? A transfusion is the replacement of blood or some of its  parts. Blood is made up of multiple cells which provide different functions.  Red blood cells carry oxygen and are used for blood loss replacement.  White blood cells fight against infection.  Platelets control bleeding.  Plasma helps clot blood.  Other blood products are available for specialized needs, such as hemophilia or other clotting disorders. BEFORE THE TRANSFUSION  Who gives blood for transfusions?   You may be able to donate blood to be used at a later date on yourself (autologous donation).  Relatives can be asked to donate  blood. This is generally not any safer than if you have received blood from a stranger. The same precautions are taken to ensure safety when a relative's blood is donated.  Healthy volunteers who are fully evaluated to make sure their blood is safe. This is blood bank blood. Transfusion therapy is the safest it has ever been in the practice of medicine. Before blood is taken from a donor, a complete history is taken to make sure that person has no history of diseases nor engages in risky social behavior (examples are intravenous drug use or sexual activity with multiple partners). The donor's travel history is screened to minimize risk of transmitting infections, such as malaria. The donated blood is tested for signs of infectious diseases, such as HIV and hepatitis. The blood is then tested to be sure it is compatible with you in order to minimize the chance of a transfusion reaction. If you or a relative donates blood, this is often done in anticipation of surgery and is not appropriate for emergency situations. It takes many days to process the donated blood. RISKS AND COMPLICATIONS Although transfusion therapy is very safe and saves many lives, the main dangers of transfusion include:   Getting an infectious disease.  Developing a transfusion reaction. This is an allergic reaction to something in the blood you were given. Every precaution is taken to prevent this. The decision to have a blood transfusion has been considered carefully by your caregiver before blood is given. Blood is not given unless the benefits outweigh the risks.

## 2017-06-01 NOTE — Progress Notes (Signed)
Follow-up Note: Gyn-Onc  Consult was initially requested by Dr. Jana Hakim for the evaluation of Kelly Mendez 41 y.o. female  CC:  Chief Complaint  Patient presents with  . Right ovarian cyst    Assessment/Plan:  Kelly Mendez  is a 41 y.o.  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  Her CA 125 is normal  - repeat today and her endometrial pathology was benign. Goserelin has controlled her abnormal uterine bleeding.  Given that I have an extremely low suspicion for cancer and her symptoms (bleeding) are controlled. Plan for surgery (robotic hysterectomy, RSO) on 06/16/17.  Given that DVT was >6 months previously, will stop xarelto 1 week preop without bridge, and will restart 1 week postop without bridge (if no sign of postop bleeding).  Discussed surgical risks including  bleeding, infection, damage to internal organs (such as bladder,ureters, bowels), blood clot, reoperation and rehospitalization. Discussed anticipated surgical recovery and side effects of menopause.   HPI: Kelly Mendez who was initially seen on 12/17/16 for heavy uterine bleeding in the setting of xarelto use for a new onset RUE DVT, in the setting of breast cancer (triple negative). She was admitted for transfusion and Goserelin was administered on 12/16/16.   Pelvic US showed a 12cm cystic righ ovarian mass (the left had bee removed as a child) and a 2+ cm endocervical mass. She had a remote history of cervical dysplasia but recently normal paps.   On 12/17/16 CA 125 was normal at 14. Endometrial biopsy on 12/17/16 revealed benign endometrial with progestin effect. ECC showed benign endocervical glands.  She was diagnosed with IDC of the right breast (triple negative) in April, 2018. She was prescribed neoadjuvant chemotherapy with a plan for interval lumpectomy and then  chest wall radiation.    Interval Hx: She is no longer having any vaginal bleeding and has no pelvic symptoms. She is s/p chemotherapy, lumpectomy and radiation (completed in November, 2018).  Current Meds:  Outpatient Encounter Medications as of 06/01/2017  Medication Sig  . atorvastatin (LIPITOR) 20 MG tablet Take 1 tablet (20 mg total) by mouth daily.  Marland Kitchen gabapentin (NEURONTIN) 300 MG capsule Take 1 capsule (300 mg total) by mouth at bedtime.  . Glucose Blood (BLOOD GLUCOSE TEST STRIPS) STRP Test twice a day. Pt uses one touch verio flex meter  . ibuprofen (ADVIL,MOTRIN) 200 MG tablet Take 400 mg by mouth daily as needed for headache.  . Melatonin 10 MG TABS Take 10 mg by mouth at bedtime.  . metFORMIN (GLUCOPHAGE) 1000 MG tablet TAKE 1 TABLET BY MOUTH TWICE DAILY WITH A MEAL  . ONETOUCH DELICA LANCETS FINE MISC Test twice a day  . TRULICITY 8.54 OE/7.0JJ SOPN INJECT 1  SUBCUTANEOUSLY ONCE A WEEK  . XARELTO 20 MG TABS tablet TAKE 1 TABLET BY MOUTH ONCE DAILY WITH SUPPER  . [DISCONTINUED] erythromycin [facility]) ophthalmic ointment Place 1 application into the right eye 3 (three) times daily.  . [DISCONTINUED] ferrous sulfate 325 (65 FE) MG tablet Take 1 tablet (325 mg total) by mouth 2 (two) times daily with a meal.   No facility-administered encounter medications on file as of 06/01/2017.     Allergy:  Allergies  Allergen Reactions  . Penicillins Other (See Comments)    As a child Has patient had a PCN reaction causing immediate rash, facial/tongue/throat swelling, SOB  or lightheadedness with hypotension: unknown Has patient had a PCN reaction causing severe rash involving mucus membranes or skin necrosis: unknown Has patient had a PCN reaction that required hospitalization unknown Has patient had a PCN reaction occurring within the last 10 years: no If all of the above answers are "NO", then may proceed with Cephalosporin use.     Social Hx:   Social History   Socioeconomic  History  . Marital status: Married    Spouse name: Not on file  . Number of children: Not on file  . Years of education: Not on file  . Highest education level: Not on file  Social Needs  . Financial resource strain: Not on file  . Food insecurity - worry: Not on file  . Food insecurity - inability: Not on file  . Transportation needs - medical: Not on file  . Transportation needs - non-medical: Not on file  Occupational History  . Not on file  Tobacco Use  . Smoking status: Former Smoker    Packs/day: 0.15    Years: 25.00    Pack years: 3.75    Types: Cigarettes    Last attempt to quit: 11/25/2016    Years since quitting: 0.5  . Smokeless tobacco: Never Used  Substance and Sexual Activity  . Alcohol use: Yes    Alcohol/week: 0.6 oz    Types: 1 Glasses of wine per week    Comment: rarely   . Drug use: No  . Sexual activity: Not Currently    Birth control/protection: Injection    Comment: husband vasectomy  Other Topics Concern  . Not on file  Social History Narrative  . Not on file    Past Surgical Hx:  Past Surgical History:  Procedure Laterality Date  . COLPOSCOPY    . INSERTION PORT-A-CATH WITH [place] Right 10/29/2016   Performed by [name], MD at [facility] OR  . left ovary removed  1978  . RIGHT BREAST LUMPECTOMY WITH RADIOACTIVE SEED AND RIGHT SENTINEL LYMPH NODE BIOPSY Right 03/18/2017   Performed by [name], MD at [facility]    Past Medical Hx:  Past Medical History:  Diagnosis Date  . Abnormal Pap smear of cervix   . Anxiety   . Asthma   . Cancer ([place])   . Concussion    around age 60  . Depression   . Diabetes type 2, uncontrolled ([place])    new diagnosis in 08/2016  . DVT (deep venous thrombosis) ([place]) 11/24/2016   in rt arm  . Family history of breast cancer   . Family history of neurofibromatosis   . Family history of ovarian cancer   . HPV in female   . Hyperlipemia   . PTSD (post-traumatic stress disorder)     Past  Gynecological History:  See HPI No LMP recorded. Patient is not currently having periods (Reason: Other).  Family Hx:  Family History  Problem Relation Age of Onset  . Breast cancer Mother 66  . Hepatitis C Father   . Ovarian cancer Maternal Aunt        dx in her 15s  . Neurofibromatosis Maternal Uncle   . Brain cancer Maternal Uncle 58  . Lung cancer Maternal Grandfather   . Kidney failure Paternal Grandmother   . Heart attack Paternal Grandfather   . Ovarian cancer Maternal Aunt        dx in her 58s  . Neurofibromatosis Maternal Aunt   . Ovarian cancer Maternal Aunt   .  Neurofibromatosis Maternal Aunt   . Cervical cancer Maternal Aunt   . Neurofibromatosis Maternal Aunt   . Cancer Maternal Aunt        cancer on the bottom of her foot  . Breast cancer Other        MGF's sisters  . Neurofibromatosis Other        MGM's paternal aunt    Review of Systems:  Constitutional  Feels well,    ENT Normal appearing ears and nares bilaterally Skin/Breast  No rash, sores, jaundice, itching, dryness Cardiovascular  No chest pain, shortness of breath, or edema  Pulmonary  No cough or wheeze.  Gastro Intestinal  No nausea, vomitting, or diarrhoea. No bright red blood per rectum, no abdominal pain, change in bowel movement, or constipation.  Genito Urinary  No frequency, urgency, dysuria, no longer bleeding Musculo Skeletal  No myalgia, arthralgia, joint swelling or pain  Neurologic  No weakness, numbness, change in gait,  Psychology  No depression, anxiety, insomnia.   Vitals:  Blood pressure 134/79, pulse 84, temperature 98.6 F (37 C), resp. rate 20, weight (!) 324 lb 12.8 oz (147.3 kg), SpO2 100 %.  Physical Exam: WD in NAD Neck  Supple NROM, without any enlargements.  Lymph Node Survey No cervical supraclavicular or inguinal adenopathy Cardiovascular  Pulse normal rate, regularity and rhythm. S1 and S2 normal.  Lungs  Clear to auscultation bilateraly, without  wheezes/crackles/rhonchi. Good air movement.  Skin  No rash/lesions/breakdown  Psychiatry  Alert and oriented to person, place, and time  Abdomen  Normoactive bowel sounds, abdomen soft, non-tender and obese without evidence of hernia.  Back No CVA tenderness Genito Urinary  Vulva/vagina: Normal external female genitalia.   No lesions. No discharge or bleeding.  Bladder/urethra:  No lesions or masses, well supported bladder  Vagina: normal   Cervix: Normal appearing, no lesions.  Uterus:  [place], mobile, no parametrial involvement or nodularity.  Adnexa: I can palpate a mobile right pelvic mobile smooth cystic mass in adnexa Rectal  deferred Extremities  No bilateral cyanosis, clubbing or edema.   Kelly Eva, MD  06/01/2017, 3:15 PM

## 2017-06-01 NOTE — H&P (View-Only) (Signed)
Follow-up Note: Gyn-Onc  Consult was initially requested by Dr. Jana Hakim for the evaluation of Kelly Mendez 41 y.o. female  CC:  Chief Complaint  Patient presents with  . Right ovarian cyst    Assessment/Plan:  [name]  is a 41 y.o.  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  Her CA 125 is normal  - repeat today and her endometrial pathology was benign. Goserelin has controlled her abnormal uterine bleeding.  Given that I have an extremely low suspicion for cancer and her symptoms (bleeding) are controlled. Plan for surgery (robotic hysterectomy, RSO) on 06/16/17.  Given that DVT was >6 months previously, will stop xarelto 1 week preop without bridge, and will restart 1 week postop without bridge (if no sign of postop bleeding).  Discussed surgical risks including  bleeding, infection, damage to internal organs (such as bladder,ureters, bowels), blood clot, reoperation and rehospitalization. Discussed anticipated surgical recovery and side effects of menopause.   HPI: Kelly Mendez is a 41 year old P2 who was initially seen on 12/17/16 for heavy uterine bleeding in the setting of xarelto use for a new onset RUE DVT, in the setting of breast cancer (triple negative). She was admitted for transfusion and Goserelin was administered on 12/16/16.   Pelvic US showed a 12cm cystic righ ovarian mass (the left had bee removed as a child) and a 2+ cm endocervical mass. She had a remote history of cervical dysplasia but recently normal paps.   On 12/17/16 CA 125 was normal at 14. Endometrial biopsy on 12/17/16 revealed benign endometrial with progestin effect. ECC showed benign endocervical glands.  She was diagnosed with IDC of the right breast (triple negative) in April, 2018. She was prescribed neoadjuvant chemotherapy with a plan for interval lumpectomy and then  chest wall radiation.    Interval Hx: She is no longer having any vaginal bleeding and has no pelvic symptoms. She is s/p chemotherapy, lumpectomy and radiation (completed in November, 2018).  Current Meds:  Outpatient Encounter Medications as of 06/01/2017  Medication Sig  . atorvastatin (LIPITOR) 20 MG tablet Take 1 tablet (20 mg total) by mouth daily.  Marland Kitchen gabapentin (NEURONTIN) 300 MG capsule Take 1 capsule (300 mg total) by mouth at bedtime.  . Glucose Blood (BLOOD GLUCOSE TEST STRIPS) STRP Test twice a day. Pt uses one touch verio flex meter  . ibuprofen (ADVIL,MOTRIN) 200 MG tablet Take 400 mg by mouth daily as needed for headache.  . Melatonin 10 MG TABS Take 10 mg by mouth at bedtime.  . metFORMIN (GLUCOPHAGE) 1000 MG tablet TAKE 1 TABLET BY MOUTH TWICE DAILY WITH A MEAL  . ONETOUCH DELICA LANCETS FINE MISC Test twice a day  . TRULICITY 8.24 MP/5.3IR SOPN INJECT 1  SUBCUTANEOUSLY ONCE A WEEK  . XARELTO 20 MG TABS tablet TAKE 1 TABLET BY MOUTH ONCE DAILY WITH SUPPER  . [DISCONTINUED] erythromycin [facility]) ophthalmic ointment Place 1 application into the right eye 3 (three) times daily.  . [DISCONTINUED] ferrous sulfate 325 (65 FE) MG tablet Take 1 tablet (325 mg total) by mouth 2 (two) times daily with a meal.   No facility-administered encounter medications on file as of 06/01/2017.     Allergy:  Allergies  Allergen Reactions  . Penicillins Other (See Comments)    As a child Has patient had a PCN reaction causing immediate rash, facial/tongue/throat swelling, SOB  or lightheadedness with hypotension: unknown Has patient had a PCN reaction causing severe rash involving mucus membranes or skin necrosis: unknown Has patient had a PCN reaction that required hospitalization unknown Has patient had a PCN reaction occurring within the last 10 years: no If all of the above answers are "NO", then may proceed with Cephalosporin use.     Social Hx:   Social History   Socioeconomic  History  . Marital status: Married    Spouse name: Not on file  . Number of children: Not on file  . Years of education: Not on file  . Highest education level: Not on file  Social Needs  . Financial resource strain: Not on file  . Food insecurity - worry: Not on file  . Food insecurity - inability: Not on file  . Transportation needs - medical: Not on file  . Transportation needs - non-medical: Not on file  Occupational History  . Not on file  Tobacco Use  . Smoking status: Former Smoker    Packs/day: 0.15    Years: 25.00    Pack years: 3.75    Types: Cigarettes    Last attempt to quit: 11/25/2016    Years since quitting: 0.5  . Smokeless tobacco: Never Used  Substance and Sexual Activity  . Alcohol use: Yes    Alcohol/week: 0.6 oz    Types: 1 Glasses of wine per week    Comment: rarely   . Drug use: No  . Sexual activity: Not Currently    Birth control/protection: Injection    Comment: husband vasectomy  Other Topics Concern  . Not on file  Social History Narrative  . Not on file    Past Surgical Hx:  Past Surgical History:  Procedure Laterality Date  . COLPOSCOPY    . INSERTION PORT-A-CATH WITH [place] Right 10/29/2016   Performed by [name], MD at [facility] OR  . left ovary removed  1978  . RIGHT BREAST LUMPECTOMY WITH RADIOACTIVE SEED AND RIGHT SENTINEL LYMPH NODE BIOPSY Right 03/18/2017   Performed by [name], MD at [facility]    Past Medical Hx:  Past Medical History:  Diagnosis Date  . Abnormal Pap smear of cervix   . Anxiety   . Asthma   . Cancer ([place])   . Concussion    around age 82  . Depression   . Diabetes type 2, uncontrolled ([place])    new diagnosis in 08/2016  . DVT (deep venous thrombosis) ([place]) 11/24/2016   in rt arm  . Family history of breast cancer   . Family history of neurofibromatosis   . Family history of ovarian cancer   . HPV in female   . Hyperlipemia   . PTSD (post-traumatic stress disorder)     Past  Gynecological History:  See HPI No LMP recorded. Patient is not currently having periods (Reason: Other).  Family Hx:  Family History  Problem Relation Age of Onset  . Breast cancer Mother 61  . Hepatitis C Father   . Ovarian cancer Maternal Aunt        dx in her 69s  . Neurofibromatosis Maternal Uncle   . Brain cancer Maternal Uncle 58  . Lung cancer Maternal Grandfather   . Kidney failure Paternal Grandmother   . Heart attack Paternal Grandfather   . Ovarian cancer Maternal Aunt        dx in her 38s  . Neurofibromatosis Maternal Aunt   . Ovarian cancer Maternal Aunt   .  Neurofibromatosis Maternal Aunt   . Cervical cancer Maternal Aunt   . Neurofibromatosis Maternal Aunt   . Cancer Maternal Aunt        cancer on the bottom of her foot  . Breast cancer Other        MGF's sisters  . Neurofibromatosis Other        MGM's paternal aunt    Review of Systems:  Constitutional  Feels well,    ENT Normal appearing ears and nares bilaterally Skin/Breast  No rash, sores, jaundice, itching, dryness Cardiovascular  No chest pain, shortness of breath, or edema  Pulmonary  No cough or wheeze.  Gastro Intestinal  No nausea, vomitting, or diarrhoea. No bright red blood per rectum, no abdominal pain, change in bowel movement, or constipation.  Genito Urinary  No frequency, urgency, dysuria, no longer bleeding Musculo Skeletal  No myalgia, arthralgia, joint swelling or pain  Neurologic  No weakness, numbness, change in gait,  Psychology  No depression, anxiety, insomnia.   Vitals:  Blood pressure 134/79, pulse 84, temperature 98.6 F (37 C), resp. rate 20, weight (!) 324 lb 12.8 oz (147.3 kg), SpO2 100 %.  Physical Exam: WD in NAD Neck  Supple NROM, without any enlargements.  Lymph Node Survey No cervical supraclavicular or inguinal adenopathy Cardiovascular  Pulse normal rate, regularity and rhythm. S1 and S2 normal.  Lungs  Clear to auscultation bilateraly, without  wheezes/crackles/rhonchi. Good air movement.  Skin  No rash/lesions/breakdown  Psychiatry  Alert and oriented to person, place, and time  Abdomen  Normoactive bowel sounds, abdomen soft, non-tender and obese without evidence of hernia.  Back No CVA tenderness Genito Urinary  Vulva/vagina: Normal external female genitalia.   No lesions. No discharge or bleeding.  Bladder/urethra:  No lesions or masses, well supported bladder  Vagina: normal   Cervix: Normal appearing, no lesions.  Uterus:  [place], mobile, no parametrial involvement or nodularity.  Adnexa: I can palpate a mobile right pelvic mobile smooth cystic mass in adnexa Rectal  deferred Extremities  No bilateral cyanosis, clubbing or edema.   Donaciano Eva, MD  06/01/2017, 3:15 PM

## 2017-06-02 ENCOUNTER — Telehealth: Payer: Self-pay | Admitting: Oncology

## 2017-06-02 ENCOUNTER — Ambulatory Visit (HOSPITAL_COMMUNITY)
Admission: RE | Admit: 2017-06-02 | Discharge: 2017-06-02 | Disposition: A | Discharge status: Home / Self Care | Payer: Managed Care, Other (non HMO) | Source: Ambulatory Visit | Attending: Gynecologic Oncology | Admitting: Gynecologic Oncology

## 2017-06-02 DIAGNOSIS — N9489 Other specified conditions associated with female genital organs and menstrual cycle: Secondary | ICD-10-CM | POA: Insufficient documentation

## 2017-06-02 DIAGNOSIS — N949 Unspecified condition associated with female genital organs and menstrual cycle: Secondary | ICD-10-CM | POA: Diagnosis present

## 2017-06-02 LAB — CA 125: Cancer Antigen (CA) 125: 11.4 U/mL (ref 0.0–38.1)

## 2017-06-02 NOTE — Telephone Encounter (Signed)
06/02/2017 @ 3:45 pm successfully faxed FMLA forms to [facility] @ [phone]. Spoke to patient @ 4:09 pm to inform her that forms were faxed.  Patient requested a copy to be mailed to [facility] [address]. [address]is [address]. [place], [place] [address]

## 2017-06-03 ENCOUNTER — Ambulatory Visit: Payer: Managed Care, Other (non HMO)

## 2017-06-05 ENCOUNTER — Inpatient Hospital Stay: Payer: Managed Care, Other (non HMO) | Attending: Oncology

## 2017-06-05 VITALS — BP 134/99 | HR 89 | Temp 98.2°F | Resp 17

## 2017-06-05 DIAGNOSIS — C50211 Malignant neoplasm of upper-inner quadrant of right female breast: Secondary | ICD-10-CM

## 2017-06-05 DIAGNOSIS — Z5111 Encounter for antineoplastic chemotherapy: Secondary | ICD-10-CM | POA: Diagnosis not present

## 2017-06-05 DIAGNOSIS — N9489 Other specified conditions associated with female genital organs and menstrual cycle: Secondary | ICD-10-CM

## 2017-06-05 DIAGNOSIS — N920 Excessive and frequent menstruation with regular cycle: Secondary | ICD-10-CM

## 2017-06-05 MED ORDER — GOSERELIN ACETATE 3.6 MG ~~LOC~~ IMPL
3.6000 mg | DRUG_IMPLANT | Freq: Once | SUBCUTANEOUS | Status: AC
  Administered 2017-06-05: 3.6 mg via SUBCUTANEOUS
  Filled 2017-06-05: qty 3.6

## 2017-06-05 NOTE — Patient Instructions (Signed)
Goserelin injection What is this medicine? GOSERELIN (GOE se rel in) is similar to a hormone found in the body. It lowers the amount of sex hormones that the body makes. Men will have lower testosterone levels and women will have lower estrogen levels while taking this medicine. In men, this medicine is used to treat prostate cancer; the injection is either given once per month or once every 12 weeks. A once per month injection (only) is used to treat women with endometriosis, dysfunctional uterine bleeding, or advanced breast cancer. This medicine may be used for other purposes; ask your health care provider or pharmacist if you have questions. COMMON BRAND NAME(S): Zoladex What should I tell my health care provider before I take this medicine? They need to know if you have any of these conditions (some only apply to women): -diabetes -heart disease or previous heart attack -high blood pressure -high cholesterol -kidney disease -osteoporosis or low bone density -problems passing urine -spinal cord injury -stroke -tobacco smoker -an unusual or allergic reaction to goserelin, hormone therapy, other medicines, foods, dyes, or preservatives -pregnant or trying to get pregnant -breast-feeding How should I use this medicine? This medicine is for injection under the skin. It is given by a health care professional in a hospital or clinic setting. Men receive this injection once every 4 weeks or once every 12 weeks. Women will only receive the once every 4 weeks injection. Talk to your pediatrician regarding the use of this medicine in children. Special care may be needed. Overdosage: If you think you have taken too much of this medicine contact a poison control center or emergency room at once. NOTE: This medicine is only for you. Do not share this medicine with others. What if I miss a dose? It is important not to miss your dose. Call your doctor or health care professional if you are unable to  keep an appointment. What may interact with this medicine? -female hormones like estrogen -herbal or dietary supplements like black cohosh, chasteberry, or DHEA -female hormones like testosterone -prasterone This list may not describe all possible interactions. Give your health care provider a list of all the medicines, herbs, non-prescription drugs, or dietary supplements you use. Also tell them if you smoke, drink alcohol, or use illegal drugs. Some items may interact with your medicine. What should I watch for while using this medicine? Visit your doctor or health care professional for regular checks on your progress. Your symptoms may appear to get worse during the first weeks of this therapy. Tell your doctor or healthcare professional if your symptoms do not start to get better or if they get worse after this time. Your bones may get weaker if you take this medicine for a long time. If you smoke or frequently drink alcohol you may increase your risk of bone loss. A family history of osteoporosis, chronic use of drugs for seizures (convulsions), or corticosteroids can also increase your risk of bone loss. Talk to your doctor about how to keep your bones strong. This medicine should stop regular monthly menstration in women. Tell your doctor if you continue to menstrate. Women should not become pregnant while taking this medicine or for 12 weeks after stopping this medicine. Women should inform their doctor if they wish to become pregnant or think they might be pregnant. There is a potential for serious side effects to an unborn child. Talk to your health care professional or pharmacist for more information. Do not breast-feed an infant while taking   this medicine. Men should inform their doctors if they wish to father a child. This medicine may lower sperm counts. Talk to your health care professional or pharmacist for more information. What side effects may I notice from receiving this  medicine? Side effects that you should report to your doctor or health care professional as soon as possible: -allergic reactions like skin rash, itching or hives, swelling of the face, lips, or tongue -bone pain -breathing problems -changes in vision -chest pain -feeling faint or lightheaded, falls -fever, chills -pain, swelling, warmth in the leg -pain, tingling, numbness in the hands or feet -signs and symptoms of low blood pressure like dizziness; feeling faint or lightheaded, falls; unusually weak or tired -stomach pain -swelling of the ankles, feet, hands -trouble passing urine or change in the amount of urine -unusually high or low blood pressure -unusually weak or tired Side effects that usually do not require medical attention (report to your doctor or health care professional if they continue or are bothersome): -change in sex drive or performance -changes in breast size in both males and females -changes in emotions or moods -headache -hot flashes -irritation at site where injected -loss of appetite -skin problems like acne, dry skin -vaginal dryness This list may not describe all possible side effects. Call your doctor for medical advice about side effects. You may report side effects to FDA at 1-800-FDA-1088. Where should I keep my medicine? This drug is given in a hospital or clinic and will not be stored at home. NOTE: This sheet is a summary. It may not cover all possible information. If you have questions about this medicine, talk to your doctor, pharmacist, or health care provider.  2018 Elsevier/Gold Standard (2013-09-06 11:10:35)  

## 2017-06-11 NOTE — Patient Instructions (Signed)
Kelly Mendez  06/11/2017   Your procedure is scheduled on: 06-16-17  Report to [facility] Main  Entrance Take [place]  elevators to 3rd floor to  [place] at     [place] AM.    Call this number if you have problems the morning of surgery [phone]    Remember: ONLY 1 PERSON MAY GO WITH YOU TO SHORT STAY TO GET  READY MORNING OF [place].              EAT A LIGHT DIET THE DAY BEFORE YOUR SURGERY EX: TOAST SOUPS BROTH YOGURT AND MASHED POTATOES AVOID: CARBONATED BEVERAGES RAW FRUITS AND VEGETABLES   Do not eat food or drink liquids :After Midnight.     Take these medicines the morning of surgery with A SIP OF WATER: atorvastatin DO NOT TAKE ANY DIABETIC MEDICATIONS DAY OF YOUR SURGERY                               You may not have any metal on your body including hair pins and              piercings  Do not wear jewelry, make-up, lotions, powders or perfumes, deodorant             Do not wear nail polish.  Do not shave  48 hours prior to surgery.            .   Do not bring valuables to the hospital. [place].  Contacts, dentures or bridgework may not be worn into surgery.      Patients discharged the day of surgery will not be allowed to drive home.  Name and phone number of your driver:  Special Instructions: N/A              Please read over the following fact sheets you were given: _____________________________________________________________________           [facility] - Preparing for Surgery Before surgery, you can play an important role.  Because skin is not sterile, your skin needs to be as free of germs as possible.  You can reduce the number of germs on your skin by washing with CHG (chlorahexidine gluconate) soap before surgery.  CHG is an antiseptic cleaner which kills germs and bonds with the skin to continue killing germs even after washing. Please DO NOT use if you have an  allergy to CHG or antibacterial soaps.  If your skin becomes reddened/irritated stop using the CHG and inform your nurse when you arrive at Short Stay. Do not shave (including legs and underarms) for at least 48 hours prior to the first CHG shower.  You may shave your face/neck. Please follow these instructions carefully:  1.  Shower with CHG Soap the night before surgery and the  morning of Surgery.  2.  If you choose to wash your hair, wash your hair first as usual with your  normal  shampoo.  3.  After you shampoo, rinse your hair and body thoroughly to remove the  shampoo.                           4.  Use CHG  as you would any other liquid soap.  You can apply chg directly  to the skin and wash                       Gently with a scrungie or clean washcloth.  5.  Apply the CHG Soap to your body ONLY FROM THE NECK DOWN.   Do not use on face/ open                           Wound or open sores. Avoid contact with eyes, ears mouth and genitals (private parts).                       Wash face,  Genitals (private parts) with your normal soap.             6.  Wash thoroughly, paying special attention to the area where your surgery  will be performed.  7.  Thoroughly rinse your body with warm water from the neck down.  8.  DO NOT shower/wash with your normal soap after using and rinsing off  the CHG Soap.                9.  Pat yourself dry with a clean towel.            10.  Wear clean pajamas.            11.  Place clean sheets on your bed the night of your first shower and do not  sleep with pets. Day of Surgery : Do not apply any lotions/deodorants the morning of surgery.  Please wear clean clothes to the hospital/surgery center.  FAILURE TO FOLLOW THESE INSTRUCTIONS MAY RESULT IN THE CANCELLATION OF YOUR SURGERY PATIENT SIGNATURE_________________________________  NURSE  SIGNATURE__________________________________  ________________________________________________________________________  WHAT IS A BLOOD TRANSFUSION? Blood Transfusion Information  A transfusion is the replacement of blood or some of its parts. Blood is made up of multiple cells which provide different functions.  Red blood cells carry oxygen and are used for blood loss replacement.  White blood cells fight against infection.  Platelets control bleeding.  Plasma helps clot blood.  Other blood products are available for specialized needs, such as hemophilia or other clotting disorders. BEFORE THE TRANSFUSION  Who gives blood for transfusions?   Healthy volunteers who are fully evaluated to make sure their blood is safe. This is blood bank blood. Transfusion therapy is the safest it has ever been in the practice of medicine. Before blood is taken from a donor, a complete history is taken to make sure that person has no history of diseases nor engages in risky social behavior (examples are intravenous drug use or sexual activity with multiple partners). The donor's travel history is screened to minimize risk of transmitting infections, such as malaria. The donated blood is tested for signs of infectious diseases, such as HIV and hepatitis. The blood is then tested to be sure it is compatible with you in order to minimize the chance of a transfusion reaction. If you or a relative donates blood, this is often done in anticipation of surgery and is not appropriate for emergency situations. It takes many days to process the donated blood. RISKS AND COMPLICATIONS Although transfusion therapy is very safe and saves many lives, the main dangers of transfusion include:   Getting an infectious disease.  Developing a transfusion reaction. This is an allergic reaction  to something in the blood you were given. Every precaution is taken to prevent this. The decision to have a blood transfusion has been  considered carefully by your caregiver before blood is given. Blood is not given unless the benefits outweigh the risks. AFTER THE TRANSFUSION  Right after receiving a blood transfusion, you will usually feel much better and more energetic. This is especially true if your red blood cells have gotten low (anemic). The transfusion raises the level of the red blood cells which carry oxygen, and this usually causes an energy increase.  The nurse administering the transfusion will monitor you carefully for complications. HOME CARE INSTRUCTIONS  No special instructions are needed after a transfusion. You may find your energy is better. Speak with your caregiver about any limitations on activity for underlying diseases you may have. SEEK MEDICAL CARE IF:   Your condition is not improving after your transfusion.  You develop redness or irritation at the intravenous (IV) site. SEEK IMMEDIATE MEDICAL CARE IF:  Any of the following symptoms occur over the next 12 hours:  Shaking chills.  You have a temperature by mouth above 102 F (38.9 C), not controlled by medicine.  Chest, back, or muscle pain.  People around you feel you are not acting correctly or are confused.  Shortness of breath or difficulty breathing.  Dizziness and fainting.  You get a rash or develop hives.  You have a decrease in urine output.  Your urine turns a dark color or changes to pink, red, or brown. Any of the following symptoms occur over the next 10 days:  You have a temperature by mouth above 102 F (38.9 C), not controlled by medicine.  Shortness of breath.  Weakness after normal activity.  The white part of the eye turns yellow (jaundice).  You have a decrease in the amount of urine or are urinating less often.  Your urine turns a dark color or changes to pink, red, or brown. Document Released: 06/27/2000 Document Revised: 09/22/2011 Document Reviewed: 02/14/2008 ExitCare Patient Information 2014  [place].  _______________________________________________________________________  Incentive Spirometer  An incentive spirometer is a tool that can help keep your lungs clear and active. This tool measures how well you are filling your lungs with each breath. Taking long deep breaths may help reverse or decrease the chance of developing breathing (pulmonary) problems (especially infection) following:  A long period of time when you are unable to move or be active. BEFORE THE PROCEDURE   If the spirometer includes an indicator to show your best effort, your nurse or respiratory therapist will set it to a desired goal.  If possible, sit up straight or lean slightly forward. Try not to slouch.  Hold the incentive spirometer in an upright position. INSTRUCTIONS FOR USE  1. Sit on the edge of your bed if possible, or sit up as far as you can in bed or on a chair. 2. Hold the incentive spirometer in an upright position. 3. Breathe out normally. 4. Place the mouthpiece in your mouth and seal your lips tightly around it. 5. Breathe in slowly and as deeply as possible, raising the piston or the ball toward the top of the column. 6. Hold your breath for 3-5 seconds or for as long as possible. Allow the piston or ball to fall to the bottom of the column. 7. Remove the mouthpiece from your mouth and breathe out normally. 8. Rest for a few seconds and repeat Steps 1 through 7 at least  10 times every 1-2 hours when you are awake. Take your time and take a few normal breaths between deep breaths. 9. The spirometer may include an indicator to show your best effort. Use the indicator as a goal to work toward during each repetition. 10. After each set of 10 deep breaths, practice coughing to be sure your lungs are clear. If you have an incision (the cut made at the time of surgery), support your incision when coughing by placing a pillow or rolled up towels firmly against it. Once you are able to get  out of bed, walk around indoors and cough well. You may stop using the incentive spirometer when instructed by your caregiver.  RISKS AND COMPLICATIONS  Take your time so you do not get dizzy or light-headed.  If you are in pain, you may need to take or ask for pain medication before doing incentive spirometry. It is harder to take a deep breath if you are having pain. AFTER USE  Rest and breathe slowly and easily.  It can be helpful to keep track of a log of your progress. Your caregiver can provide you with a simple table to help with this. If you are using the spirometer at home, follow these instructions: [place] IF:   You are having difficultly using the spirometer.  You have trouble using the spirometer as often as instructed.  Your pain medication is not giving enough relief while using the spirometer.  You develop fever of 100.5 F (38.1 C) or higher. SEEK IMMEDIATE MEDICAL CARE IF:   You cough up bloody sputum that had not been present before.  You develop fever of 102 F (38.9 C) or greater.  You develop worsening pain at or near the incision site. MAKE SURE YOU:   Understand these instructions.  Will watch your condition.  Will get help right away if you are not doing well or get worse. Document Released: 11/10/2006 Document Revised: 09/22/2011 Document Reviewed: 01/11/2007 [facility] Patient Information 2014 [place], [place].   ________________________________________________________________________

## 2017-06-11 NOTE — Progress Notes (Signed)
EKG 12/15/12 Epic  CXR 12/15/16 Epic  ECHO 12/15/16 EPIC

## 2017-06-12 ENCOUNTER — Encounter (HOSPITAL_COMMUNITY): Payer: Self-pay

## 2017-06-12 ENCOUNTER — Other Ambulatory Visit: Payer: Self-pay

## 2017-06-12 ENCOUNTER — Encounter (HOSPITAL_COMMUNITY)
Admission: RE | Admit: 2017-06-12 | Discharge: 2017-06-12 | Disposition: A | Discharge status: Home / Self Care | Payer: Managed Care, Other (non HMO) | Source: Ambulatory Visit | Attending: Gynecologic Oncology | Admitting: Gynecologic Oncology

## 2017-06-12 DIAGNOSIS — N838 Other noninflammatory disorders of ovary, fallopian tube and broad ligament: Secondary | ICD-10-CM | POA: Diagnosis not present

## 2017-06-12 DIAGNOSIS — Z7901 Long term (current) use of anticoagulants: Secondary | ICD-10-CM | POA: Diagnosis not present

## 2017-06-12 DIAGNOSIS — E119 Type 2 diabetes mellitus without complications: Secondary | ICD-10-CM | POA: Diagnosis not present

## 2017-06-12 DIAGNOSIS — Z853 Personal history of malignant neoplasm of breast: Secondary | ICD-10-CM | POA: Diagnosis not present

## 2017-06-12 DIAGNOSIS — C50911 Malignant neoplasm of unspecified site of right female breast: Secondary | ICD-10-CM | POA: Insufficient documentation

## 2017-06-12 DIAGNOSIS — Z7984 Long term (current) use of oral hypoglycemic drugs: Secondary | ICD-10-CM | POA: Diagnosis not present

## 2017-06-12 DIAGNOSIS — Z803 Family history of malignant neoplasm of breast: Secondary | ICD-10-CM | POA: Diagnosis not present

## 2017-06-12 DIAGNOSIS — Z9221 Personal history of antineoplastic chemotherapy: Secondary | ICD-10-CM | POA: Insufficient documentation

## 2017-06-12 DIAGNOSIS — Z79899 Other long term (current) drug therapy: Secondary | ICD-10-CM | POA: Diagnosis not present

## 2017-06-12 DIAGNOSIS — Z8041 Family history of malignant neoplasm of ovary: Secondary | ICD-10-CM | POA: Diagnosis not present

## 2017-06-12 DIAGNOSIS — Z923 Personal history of irradiation: Secondary | ICD-10-CM | POA: Diagnosis not present

## 2017-06-12 DIAGNOSIS — N800 Endometriosis of uterus: Secondary | ICD-10-CM | POA: Diagnosis not present

## 2017-06-12 DIAGNOSIS — Z01812 Encounter for preprocedural laboratory examination: Secondary | ICD-10-CM | POA: Diagnosis not present

## 2017-06-12 DIAGNOSIS — N83201 Unspecified ovarian cyst, right side: Secondary | ICD-10-CM | POA: Diagnosis present

## 2017-06-12 DIAGNOSIS — Z6841 Body Mass Index (BMI) 40.0 and over, adult: Secondary | ICD-10-CM | POA: Diagnosis not present

## 2017-06-12 DIAGNOSIS — Z86718 Personal history of other venous thrombosis and embolism: Secondary | ICD-10-CM | POA: Diagnosis not present

## 2017-06-12 DIAGNOSIS — D259 Leiomyoma of uterus, unspecified: Secondary | ICD-10-CM | POA: Diagnosis not present

## 2017-06-12 DIAGNOSIS — E785 Hyperlipidemia, unspecified: Secondary | ICD-10-CM | POA: Diagnosis not present

## 2017-06-12 DIAGNOSIS — D271 Benign neoplasm of left ovary: Secondary | ICD-10-CM | POA: Diagnosis not present

## 2017-06-12 DIAGNOSIS — Z87891 Personal history of nicotine dependence: Secondary | ICD-10-CM | POA: Diagnosis not present

## 2017-06-12 HISTORY — DX: Pneumonia, unspecified organism: J18.9

## 2017-06-12 HISTORY — DX: Myoneural disorder, unspecified: G70.9

## 2017-06-12 LAB — CBC
HCT: 40.2 % (ref 36.0–46.0)
Hemoglobin: 12.9 g/dL (ref 12.0–15.0)
MCH: 25 pg — ABNORMAL LOW (ref 26.0–34.0)
MCHC: 32.1 g/dL (ref 30.0–36.0)
MCV: 78.1 fL (ref 78.0–100.0)
Platelets: 286 10*3/uL (ref 150–400)
RBC: 5.15 MIL/uL — ABNORMAL HIGH (ref 3.87–5.11)
RDW: 17.8 % — ABNORMAL HIGH (ref 11.5–15.5)
WBC: 7.2 10*3/uL (ref 4.0–10.5)

## 2017-06-12 LAB — BASIC METABOLIC PANEL
Anion gap: 8 (ref 5–15)
BUN: 9 mg/dL (ref 6–20)
CO2: 25 mmol/L (ref 22–32)
Calcium: 9.6 mg/dL (ref 8.9–10.3)
Chloride: 104 mmol/L (ref 101–111)
Creatinine, Ser: 0.75 mg/dL (ref 0.44–1.00)
GFR calc Af Amer: >60 mL/min (ref >60)
GFR calc non Af Amer: >60 mL/min (ref >60)
Glucose, Bld: 125 mg/dL — ABNORMAL HIGH (ref 65–99)
Potassium: 3.9 mmol/L (ref 3.5–5.1)
Sodium: 137 mmol/L (ref 135–145)

## 2017-06-12 LAB — URINALYSIS, ROUTINE W REFLEX MICROSCOPIC
Bilirubin Urine: NEGATIVE
Glucose, UA: NEGATIVE mg/dL
Ketones, ur: NEGATIVE mg/dL
Nitrite: NEGATIVE
Protein, ur: 30 mg/dL — AB
Specific Gravity, Urine: 1.025 (ref 1.005–1.030)
pH: 5 (ref 5.0–8.0)

## 2017-06-12 LAB — PREGNANCY, URINE: Preg Test, Ur: NEGATIVE

## 2017-06-12 LAB — HEMOGLOBIN A1C
Hgb A1c MFr Bld: 6.4 % — ABNORMAL HIGH (ref 4.8–5.6)
Mean Plasma Glucose: 136.98 mg/dL

## 2017-06-12 LAB — GLUCOSE, CAPILLARY: Glucose-Capillary: 146 mg/dL — ABNORMAL HIGH (ref 65–99)

## 2017-06-12 NOTE — Progress Notes (Signed)
UA DONE 06-12-17 ROUTED TO DR. Denman George VIA EPIC

## 2017-06-14 LAB — URINE CULTURE

## 2017-06-15 ENCOUNTER — Telehealth: Payer: Self-pay | Admitting: Oncology

## 2017-06-15 ENCOUNTER — Encounter: Payer: Self-pay | Admitting: Radiation Oncology

## 2017-06-15 NOTE — Telephone Encounter (Signed)
06/15/2017 faxed medical records to Maralyn Sago @ [phone] with [employer]

## 2017-06-16 ENCOUNTER — Ambulatory Visit (HOSPITAL_COMMUNITY)
Admission: RE | Admit: 2017-06-16 | Discharge: 2017-06-17 | Disposition: A | Discharge status: Home / Self Care | Payer: Managed Care, Other (non HMO) | Source: Ambulatory Visit | Attending: Gynecologic Oncology | Admitting: Gynecologic Oncology

## 2017-06-16 ENCOUNTER — Encounter (HOSPITAL_COMMUNITY)
Admission: RE | Disposition: A | Discharge status: Home / Self Care | Payer: Self-pay | Source: Ambulatory Visit | Attending: Gynecologic Oncology

## 2017-06-16 ENCOUNTER — Ambulatory Visit (HOSPITAL_COMMUNITY): Payer: Managed Care, Other (non HMO) | Admitting: Registered Nurse

## 2017-06-16 ENCOUNTER — Other Ambulatory Visit: Payer: Self-pay

## 2017-06-16 ENCOUNTER — Encounter (HOSPITAL_COMMUNITY): Payer: Self-pay | Admitting: *Deleted

## 2017-06-16 DIAGNOSIS — N83202 Unspecified ovarian cyst, left side: Secondary | ICD-10-CM | POA: Diagnosis not present

## 2017-06-16 DIAGNOSIS — D271 Benign neoplasm of left ovary: Secondary | ICD-10-CM | POA: Diagnosis not present

## 2017-06-16 DIAGNOSIS — Z803 Family history of malignant neoplasm of breast: Secondary | ICD-10-CM | POA: Insufficient documentation

## 2017-06-16 DIAGNOSIS — Z8041 Family history of malignant neoplasm of ovary: Secondary | ICD-10-CM | POA: Insufficient documentation

## 2017-06-16 DIAGNOSIS — Z6841 Body Mass Index (BMI) 40.0 and over, adult: Secondary | ICD-10-CM | POA: Insufficient documentation

## 2017-06-16 DIAGNOSIS — Z853 Personal history of malignant neoplasm of breast: Secondary | ICD-10-CM | POA: Insufficient documentation

## 2017-06-16 DIAGNOSIS — E785 Hyperlipidemia, unspecified: Secondary | ICD-10-CM | POA: Insufficient documentation

## 2017-06-16 DIAGNOSIS — N838 Other noninflammatory disorders of ovary, fallopian tube and broad ligament: Secondary | ICD-10-CM | POA: Insufficient documentation

## 2017-06-16 DIAGNOSIS — D259 Leiomyoma of uterus, unspecified: Secondary | ICD-10-CM | POA: Insufficient documentation

## 2017-06-16 DIAGNOSIS — N83201 Unspecified ovarian cyst, right side: Secondary | ICD-10-CM | POA: Diagnosis present

## 2017-06-16 DIAGNOSIS — Z79899 Other long term (current) drug therapy: Secondary | ICD-10-CM | POA: Insufficient documentation

## 2017-06-16 DIAGNOSIS — Z9221 Personal history of antineoplastic chemotherapy: Secondary | ICD-10-CM | POA: Insufficient documentation

## 2017-06-16 DIAGNOSIS — R1900 Intra-abdominal and pelvic swelling, mass and lump, unspecified site: Secondary | ICD-10-CM

## 2017-06-16 DIAGNOSIS — Z86718 Personal history of other venous thrombosis and embolism: Secondary | ICD-10-CM | POA: Insufficient documentation

## 2017-06-16 DIAGNOSIS — N800 Endometriosis of uterus: Secondary | ICD-10-CM | POA: Insufficient documentation

## 2017-06-16 DIAGNOSIS — E119 Type 2 diabetes mellitus without complications: Secondary | ICD-10-CM | POA: Insufficient documentation

## 2017-06-16 DIAGNOSIS — Z7984 Long term (current) use of oral hypoglycemic drugs: Secondary | ICD-10-CM | POA: Insufficient documentation

## 2017-06-16 DIAGNOSIS — Z87891 Personal history of nicotine dependence: Secondary | ICD-10-CM | POA: Insufficient documentation

## 2017-06-16 DIAGNOSIS — Z7901 Long term (current) use of anticoagulants: Secondary | ICD-10-CM | POA: Insufficient documentation

## 2017-06-16 DIAGNOSIS — Z923 Personal history of irradiation: Secondary | ICD-10-CM | POA: Insufficient documentation

## 2017-06-16 HISTORY — DX: Intra-abdominal and pelvic swelling, mass and lump, unspecified site: R19.00

## 2017-06-16 HISTORY — PX: ROBOTIC ASSISTED TOTAL HYSTERECTOMY: SHX6085

## 2017-06-16 LAB — TYPE AND SCREEN
ABO/RH(D): O POS
Antibody Screen: NEGATIVE

## 2017-06-16 LAB — GLUCOSE, CAPILLARY
Glucose-Capillary: 125 mg/dL — ABNORMAL HIGH (ref 65–99)
Glucose-Capillary: 142 mg/dL — ABNORMAL HIGH (ref 65–99)
Glucose-Capillary: 173 mg/dL — ABNORMAL HIGH (ref 65–99)
Glucose-Capillary: 172 mg/dL — ABNORMAL HIGH (ref 65–99)
Glucose-Capillary: 175 mg/dL — ABNORMAL HIGH (ref 65–99)

## 2017-06-16 SURGERY — HYSTERECTOMY, TOTAL, ROBOT-ASSISTED
Anesthesia: General | Site: Abdomen | Laterality: Bilateral

## 2017-06-16 MED ORDER — FENTANYL CITRATE (PF) 100 MCG/2ML IJ SOLN
INTRAMUSCULAR | Status: AC
  Filled 2017-06-16: qty 4

## 2017-06-16 MED ORDER — SUGAMMADEX SODIUM 200 MG/2ML IV SOLN
INTRAVENOUS | Status: DC | PRN
  Administered 2017-06-16: 290 mg via INTRAVENOUS

## 2017-06-16 MED ORDER — ATORVASTATIN CALCIUM 20 MG PO TABS
20.0000 mg | ORAL_TABLET | Freq: Every day | ORAL | Status: DC
  Administered 2017-06-17: 20 mg via ORAL
  Filled 2017-06-16: qty 1

## 2017-06-16 MED ORDER — PROPOFOL 10 MG/ML IV BOLUS
INTRAVENOUS | Status: AC
  Filled 2017-06-16: qty 20

## 2017-06-16 MED ORDER — KETAMINE HCL 10 MG/ML IJ SOLN
INTRAMUSCULAR | Status: AC
  Filled 2017-06-16: qty 1

## 2017-06-16 MED ORDER — GABAPENTIN 300 MG PO CAPS
300.0000 mg | ORAL_CAPSULE | Freq: Every day | ORAL | Status: DC
  Administered 2017-06-16: 300 mg via ORAL
  Filled 2017-06-16: qty 1

## 2017-06-16 MED ORDER — CIPROFLOXACIN IN D5W 400 MG/200ML IV SOLN
INTRAVENOUS | Status: AC
  Filled 2017-06-16: qty 200

## 2017-06-16 MED ORDER — LIDOCAINE 2% (20 MG/ML) 5 ML SYRINGE
INTRAMUSCULAR | Status: DC | PRN
  Administered 2017-06-16: 1 mg/kg/h via INTRAVENOUS

## 2017-06-16 MED ORDER — 0.9 % SODIUM CHLORIDE (POUR BTL) OPTIME
TOPICAL | Status: DC | PRN
  Administered 2017-06-16: 1000 mL

## 2017-06-16 MED ORDER — HYDROMORPHONE HCL 1 MG/ML IJ SOLN: 0.2000 mg | INTRAMUSCULAR | Status: DC | PRN

## 2017-06-16 MED ORDER — FENTANYL CITRATE (PF) 250 MCG/5ML IJ SOLN
INTRAMUSCULAR | Status: AC
  Filled 2017-06-16: qty 5

## 2017-06-16 MED ORDER — ENOXAPARIN SODIUM 40 MG/0.4ML ~~LOC~~ SOLN
40.0000 mg | SUBCUTANEOUS | Status: AC
  Administered 2017-06-16: 40 mg via SUBCUTANEOUS
  Filled 2017-06-16: qty 0.4

## 2017-06-16 MED ORDER — MIDAZOLAM HCL 5 MG/5ML IJ SOLN
INTRAMUSCULAR | Status: DC | PRN
  Administered 2017-06-16: 2 mg via INTRAVENOUS

## 2017-06-16 MED ORDER — CLINDAMYCIN PHOSPHATE 900 MG/50ML IV SOLN
INTRAVENOUS | Status: AC
  Filled 2017-06-16: qty 50

## 2017-06-16 MED ORDER — ONDANSETRON HCL 4 MG/2ML IJ SOLN
INTRAMUSCULAR | Status: DC | PRN
  Administered 2017-06-16: 4 mg via INTRAVENOUS

## 2017-06-16 MED ORDER — CLINDAMYCIN PHOSPHATE 900 MG/50ML IV SOLN
900.0000 mg | INTRAVENOUS | Status: AC
  Administered 2017-06-16: 900 mg via INTRAVENOUS

## 2017-06-16 MED ORDER — ENOXAPARIN SODIUM 40 MG/0.4ML ~~LOC~~ SOLN
40.0000 mg | SUBCUTANEOUS | Status: DC
  Administered 2017-06-17: 40 mg via SUBCUTANEOUS
  Filled 2017-06-16: qty 0.4

## 2017-06-16 MED ORDER — KETAMINE HCL 10 MG/ML IJ SOLN
INTRAMUSCULAR | Status: DC | PRN
  Administered 2017-06-16 (×2): 35 mg via INTRAVENOUS

## 2017-06-16 MED ORDER — PROMETHAZINE HCL 25 MG/ML IJ SOLN: 6.2500 mg | INTRAMUSCULAR | Status: DC | PRN

## 2017-06-16 MED ORDER — ROCURONIUM BROMIDE 10 MG/ML (PF) SYRINGE
PREFILLED_SYRINGE | INTRAVENOUS | Status: DC | PRN
  Administered 2017-06-16: 50 mg via INTRAVENOUS
  Administered 2017-06-16: 20 mg via INTRAVENOUS
  Administered 2017-06-16: 10 mg via INTRAVENOUS

## 2017-06-16 MED ORDER — LACTATED RINGERS IV SOLN: INTRAVENOUS | Status: DC

## 2017-06-16 MED ORDER — ROCURONIUM BROMIDE 50 MG/5ML IV SOSY
PREFILLED_SYRINGE | INTRAVENOUS | Status: AC
  Filled 2017-06-16: qty 5

## 2017-06-16 MED ORDER — SUGAMMADEX SODIUM 500 MG/5ML IV SOLN
INTRAVENOUS | Status: AC
  Filled 2017-06-16: qty 5

## 2017-06-16 MED ORDER — SENNOSIDES-DOCUSATE SODIUM 8.6-50 MG PO TABS
2.0000 | ORAL_TABLET | Freq: Every day | ORAL | Status: DC
  Administered 2017-06-16: 2 via ORAL
  Filled 2017-06-16: qty 2

## 2017-06-16 MED ORDER — LACTATED RINGERS IR SOLN
Status: DC | PRN
  Administered 2017-06-16: 1000 mL

## 2017-06-16 MED ORDER — FENTANYL CITRATE (PF) 100 MCG/2ML IJ SOLN
25.0000 ug | INTRAMUSCULAR | Status: DC | PRN
  Administered 2017-06-16 (×2): 25 ug via INTRAVENOUS
  Administered 2017-06-16: 50 ug via INTRAVENOUS

## 2017-06-16 MED ORDER — PROPOFOL 10 MG/ML IV BOLUS
INTRAVENOUS | Status: DC | PRN
  Administered 2017-06-16: 250 mg via INTRAVENOUS

## 2017-06-16 MED ORDER — CELECOXIB 200 MG PO CAPS
400.0000 mg | ORAL_CAPSULE | ORAL | Status: AC
  Administered 2017-06-16: 400 mg via ORAL
  Filled 2017-06-16: qty 2

## 2017-06-16 MED ORDER — FENTANYL CITRATE (PF) 100 MCG/2ML IJ SOLN
INTRAMUSCULAR | Status: DC | PRN
  Administered 2017-06-16 (×3): 50 ug via INTRAVENOUS

## 2017-06-16 MED ORDER — DEXAMETHASONE SODIUM PHOSPHATE 10 MG/ML IJ SOLN
INTRAMUSCULAR | Status: DC | PRN
  Administered 2017-06-16: 10 mg via INTRAVENOUS

## 2017-06-16 MED ORDER — ONDANSETRON HCL 4 MG/2ML IJ SOLN
INTRAMUSCULAR | Status: AC
  Filled 2017-06-16: qty 2

## 2017-06-16 MED ORDER — ONDANSETRON HCL 4 MG/2ML IJ SOLN: 4.0000 mg | Freq: Four times a day (QID) | INTRAMUSCULAR | Status: DC | PRN

## 2017-06-16 MED ORDER — ONDANSETRON HCL 4 MG PO TABS: 4.0000 mg | ORAL_TABLET | Freq: Four times a day (QID) | ORAL | Status: DC | PRN

## 2017-06-16 MED ORDER — SCOPOLAMINE 1 MG/3DAYS TD PT72
1.0000 | MEDICATED_PATCH | TRANSDERMAL | Status: DC
  Administered 2017-06-16: 1.5 mg via TRANSDERMAL
  Filled 2017-06-16: qty 1

## 2017-06-16 MED ORDER — INSULIN GLARGINE 100 UNIT/ML ~~LOC~~ SOLN
28.0000 [IU] | Freq: Every day | SUBCUTANEOUS | Status: DC
  Administered 2017-06-16: 28 [IU] via SUBCUTANEOUS
  Filled 2017-06-16 (×2): qty 0.28

## 2017-06-16 MED ORDER — LIDOCAINE 2% (20 MG/ML) 5 ML SYRINGE: INTRAMUSCULAR | Status: DC | PRN

## 2017-06-16 MED ORDER — DICLOFENAC SODIUM 50 MG PO TBEC
50.0000 mg | DELAYED_RELEASE_TABLET | Freq: Four times a day (QID) | ORAL | Status: DC
  Administered 2017-06-16 – 2017-06-17 (×3): 50 mg via ORAL
  Filled 2017-06-16 (×5): qty 1

## 2017-06-16 MED ORDER — LIDOCAINE 2% (20 MG/ML) 5 ML SYRINGE
INTRAMUSCULAR | Status: DC | PRN
Start: 2017-06-16 — End: 2017-06-16
  Administered 2017-06-16: 80 mg via INTRAVENOUS

## 2017-06-16 MED ORDER — LACTATED RINGERS IV SOLN
INTRAVENOUS | Status: DC | PRN
  Administered 2017-06-16: 07:00:00 via INTRAVENOUS

## 2017-06-16 MED ORDER — OXYCODONE-ACETAMINOPHEN 5-325 MG PO TABS
ORAL_TABLET | ORAL | Status: AC
  Filled 2017-06-16: qty 1

## 2017-06-16 MED ORDER — CIPROFLOXACIN IN D5W 400 MG/200ML IV SOLN
400.0000 mg | INTRAVENOUS | Status: AC
Start: 2017-06-16 — End: 2017-06-16
  Administered 2017-06-16: 400 mg via INTRAVENOUS

## 2017-06-16 MED ORDER — INSULIN ASPART 100 UNIT/ML ~~LOC~~ SOLN
0.0000 [IU] | Freq: Three times a day (TID) | SUBCUTANEOUS | Status: DC
  Administered 2017-06-16 (×2): 3 [IU] via SUBCUTANEOUS
  Administered 2017-06-17 (×2): 2 [IU] via SUBCUTANEOUS

## 2017-06-16 MED ORDER — DEXAMETHASONE SODIUM PHOSPHATE 10 MG/ML IJ SOLN
INTRAMUSCULAR | Status: AC
  Filled 2017-06-16: qty 1

## 2017-06-16 MED ORDER — KCL IN DEXTROSE-NACL 20-5-0.45 MEQ/L-%-% IV SOLN
INTRAVENOUS | Status: DC
  Administered 2017-06-16 – 2017-06-17 (×2): via INTRAVENOUS
  Filled 2017-06-16 (×2): qty 1000

## 2017-06-16 MED ORDER — OXYCODONE-ACETAMINOPHEN 5-325 MG PO TABS
1.0000 | ORAL_TABLET | ORAL | Status: DC | PRN
  Administered 2017-06-16 – 2017-06-17 (×2): 1 via ORAL
  Filled 2017-06-16: qty 2
  Filled 2017-06-16: qty 1

## 2017-06-16 MED ORDER — MIDAZOLAM HCL 2 MG/2ML IJ SOLN
INTRAMUSCULAR | Status: AC
  Filled 2017-06-16: qty 2

## 2017-06-16 MED ORDER — GABAPENTIN 300 MG PO CAPS
300.0000 mg | ORAL_CAPSULE | ORAL | Status: AC
  Administered 2017-06-16: 300 mg via ORAL
  Filled 2017-06-16: qty 1

## 2017-06-16 MED ORDER — ACETAMINOPHEN 500 MG PO TABS
1000.0000 mg | ORAL_TABLET | ORAL | Status: AC
  Administered 2017-06-16: 1000 mg via ORAL
  Filled 2017-06-16: qty 2

## 2017-06-16 MED ORDER — DEXAMETHASONE SODIUM PHOSPHATE 4 MG/ML IJ SOLN: 4.0000 mg | INTRAMUSCULAR | Status: DC

## 2017-06-16 MED ORDER — LIDOCAINE 2% (20 MG/ML) 5 ML SYRINGE
INTRAMUSCULAR | Status: AC
  Filled 2017-06-16: qty 5

## 2017-06-16 SURGICAL SUPPLY — 48 items
ADH SKN CLS APL DERMABOND .7 (GAUZE/BANDAGES/DRESSINGS) ×1
AGENT HMST KT MTR STRL THRMB (HEMOSTASIS)
APL ESCP 34 STRL LF DISP (HEMOSTASIS)
APPLICATOR SURGIFLO ENDO (HEMOSTASIS) IMPLANT
BAG LAPAROSCOPIC 12 15 PORT 16 (BASKET) IMPLANT
BAG RETRIEVAL 12/15 (BASKET) ×2
BAG RETRIEVAL 12/15MM (BASKET) ×1
BAG SPEC RTRVL LRG 6X4 10 (ENDOMECHANICALS)
COVER BACK TABLE 60X90IN (DRAPES) ×3 IMPLANT
COVER TIP SHEARS 8 DVNC (MISCELLANEOUS) ×1 IMPLANT
COVER TIP SHEARS 8MM DA VINCI (MISCELLANEOUS) ×2
DERMABOND ADVANCED (GAUZE/BANDAGES/DRESSINGS) ×2
DERMABOND ADVANCED .7 DNX12 (GAUZE/BANDAGES/DRESSINGS) IMPLANT
DRAPE ARM DVNC X/XI (DISPOSABLE) ×4 IMPLANT
DRAPE COLUMN DVNC XI (DISPOSABLE) ×1 IMPLANT
DRAPE DA VINCI XI ARM (DISPOSABLE) ×8
DRAPE DA VINCI XI COLUMN (DISPOSABLE) ×2
DRAPE SHEET LG 3/4 BI-LAMINATE (DRAPES) ×3 IMPLANT
DRAPE SURG IRRIG POUCH 19X23 (DRAPES) ×3 IMPLANT
ELECT REM PT RETURN 15FT ADLT (MISCELLANEOUS) ×3 IMPLANT
GLOVE BIO SURGEON STRL SZ 6 (GLOVE) ×12 IMPLANT
GLOVE BIO SURGEON STRL SZ 6.5 (GLOVE) ×4 IMPLANT
GLOVE BIO SURGEONS STRL SZ 6.5 (GLOVE) ×2
GOWN STRL REUS W/ TWL LRG LVL3 (GOWN DISPOSABLE) ×2 IMPLANT
GOWN STRL REUS W/TWL LRG LVL3 (GOWN DISPOSABLE) ×6
HOLDER FOLEY CATH W/STRAP (MISCELLANEOUS) ×3 IMPLANT
IRRIG SUCT STRYKERFLOW 2 WTIP (MISCELLANEOUS) ×3
IRRIGATION SUCT STRKRFLW 2 WTP (MISCELLANEOUS) ×1 IMPLANT
MANIPULATOR UTERINE 4.5 ZUMI (MISCELLANEOUS) ×3 IMPLANT
OBTURATOR OPTICAL STANDARD 8MM (TROCAR) ×2
OBTURATOR OPTICAL STND 8 DVNC (TROCAR) ×1
OBTURATOR OPTICALSTD 8 DVNC (TROCAR) ×1 IMPLANT
PACK ROBOT GYN CUSTOM WL (TRAY / TRAY PROCEDURE) ×3 IMPLANT
PAD POSITIONING PINK XL (MISCELLANEOUS) ×3 IMPLANT
POUCH SPECIMEN RETRIEVAL 10MM (ENDOMECHANICALS) IMPLANT
SEAL CANN UNIV 5-8 DVNC XI (MISCELLANEOUS) ×4 IMPLANT
SEAL XI 5MM-8MM UNIVERSAL (MISCELLANEOUS) ×8
SET TRI-LUMEN FLTR TB AIRSEAL (TUBING) ×3 IMPLANT
SOLUTION ELECTROLUBE (MISCELLANEOUS) ×3 IMPLANT
SURGIFLO W/THROMBIN 8M KIT (HEMOSTASIS) IMPLANT
SUT PROLENE 5 0 CC 1 (SUTURE) IMPLANT
SUT VIC AB 0 CT1 27 (SUTURE) ×9
SUT VIC AB 0 CT1 27XBRD ANTBC (SUTURE) IMPLANT
TOWEL OR NON WOVEN STRL DISP B (DISPOSABLE) ×3 IMPLANT
TRAP SPECIMEN MUCOUS 40CC (MISCELLANEOUS) ×2 IMPLANT
TRAY FOLEY W/METER SILVER 16FR (SET/KITS/TRAYS/PACK) ×3 IMPLANT
UNDERPAD 30X30 (UNDERPADS AND DIAPERS) ×3 IMPLANT
WATER STERILE IRR 1000ML POUR (IV SOLUTION) ×3 IMPLANT

## 2017-06-16 NOTE — Interval H&P Note (Signed)
History and Physical Interval Note:  06/16/2017 7:15 AM  Kelly Mendez  has presented today for surgery, with the diagnosis of right ovarian cyst  abnormal uterine bleeding  The various methods of treatment have been discussed with the patient and family. After consideration of risks, benefits and other options for treatment, the patient has consented to  Procedure(s): XI ROBOTIC ASSISTED TOTAL HYSTERECTOMY WITH BILATERAL SALPINGECTOMY AND RIGHT OOPHERECTOMY (Bilateral) as a surgical intervention .  The patient's history has been reviewed, patient examined, no change in status, stable for surgery.  I have reviewed the patient's chart and labs.  Questions were answered to the patient's satisfaction.     Donaciano Eva

## 2017-06-16 NOTE — Anesthesia Postprocedure Evaluation (Signed)
Anesthesia Post Note  Patient: Kelly Mendez  Procedure(s) Performed: XI ROBOTIC ASSISTED TOTAL HYSTERECTOMY WITH BILATERAL SALPINGECTOMY,  OOPHERECTOMY (Bilateral Abdomen)     Patient location during evaluation: PACU Anesthesia Type: General Level of consciousness: awake and alert Pain management: pain level controlled Vital Signs Assessment: post-procedure vital signs reviewed and stable Respiratory status: spontaneous breathing, nonlabored ventilation and respiratory function stable Cardiovascular status: blood pressure returned to baseline and stable Postop Assessment: no apparent nausea or vomiting Anesthetic complications: no    Last Vitals:  Vitals:   06/16/17 1045 06/16/17 1100  BP: (!) 132/94 (!) 142/99  Pulse: 78 79  Resp: 14 16  Temp:  36.9 C  SpO2: 94% 95%    Last Pain:  Vitals:   06/16/17 1100  TempSrc:   PainSc: Asleep                 Catalina Gravel

## 2017-06-16 NOTE — Anesthesia Procedure Notes (Signed)
Procedure Name: Intubation Date/Time: 06/16/2017 7:34 AM Performed by: Victoriano Lain, CRNA Pre-anesthesia Checklist: Patient identified, Emergency Drugs available, Suction available, Patient being monitored and Timeout performed Patient Re-evaluated:Patient Re-evaluated prior to induction Oxygen Delivery Method: Circle system utilized Preoxygenation: Pre-oxygenation with 100% oxygen Induction Type: IV induction Ventilation: Mask ventilation without difficulty Laryngoscope Size: Mac and 4 Grade View: Grade I Tube type: Oral Tube size: 7.5 mm Number of attempts: 1 Airway Equipment and Method: Stylet Placement Confirmation: ETT inserted through vocal cords under direct vision,  positive ETCO2 and breath sounds checked- equal and bilateral Secured at: 21 cm Tube secured with: Tape Dental Injury: Teeth and Oropharynx as per pre-operative assessment

## 2017-06-16 NOTE — Op Note (Signed)
OPERATIVE NOTE 06/16/17  Surgeon: Donaciano Eva   Assistants: Dr Lahoma Crocker (an MD assistant was necessary for tissue manipulation, management of robotic instrumentation, retraction and positioning due to the complexity of the case and hospital policies).   Anesthesia: General endotracheal anesthesia  ASA Class: 3   Pre-operative Diagnosis: ovarian cyst  Post-operative Diagnosis: left ovarian benign serous cyst  Operation: Robotic-assisted laparoscopic total hysterectomy with bilateral salpingoophorectomy   Surgeon: Donaciano Eva  Assistant Surgeon: Lahoma Crocker MD  Anesthesia: GET  Urine Output: 300  Operative Findings:  : 7cm normal uterus, normal right tube and ovary, left ovary with 12cm serous cyst  Estimated Blood Loss:  less than 50 mL      Total IV Fluids: 1,000 ml         Specimens: uterus, cervix, left and right tube and ovary. washings.         Complications:  None; patient tolerated the procedure well.         Disposition: PACU - hemodynamically stable.  Procedure Details  The patient was seen in the Holding Room. The risks, benefits, complications, treatment options, and expected outcomes were discussed with the patient.  The patient concurred with the proposed plan, giving informed consent.  The site of surgery properly noted/marked. The patient was identified as Kelly Mendez and the procedure verified as a Robotic-assisted hysterectomy with bilateral salpingo oophorectomy. A Time Out was held and the above information confirmed.  After induction of anesthesia, the patient was draped and prepped in the usual sterile manner. Pt was placed in supine position after anesthesia and draped and prepped in the usual sterile manner. The abdominal drape was placed after the CholoraPrep had been allowed to dry for 3 minutes.  Her arms were tucked to her side with all appropriate precautions.  The shoulders were stabilized with padded shoulder  blocks applied to the acromium processes.  The patient was placed in the semi-lithotomy position in [place].  The perineum was prepped with Betadine. The patient was then prepped. Foley catheter was placed.  A sterile speculum was placed in the vagina.  The cervix was grasped with a single-tooth tenaculum and dilated with Kennon Rounds dilators.  The ZUMI uterine manipulator with a medium colpotomizer ring was placed without difficulty.  A pneum occluder balloon was placed over the manipulator.  OG tube placement was confirmed and to suction.   Next, a 5 mm skin incision was made 1 cm below the subcostal margin in the midclavicular line.  The 5 mm Optiview port and scope was used for direct entry.  Opening pressure was under 10 mm CO2.  The abdomen was insufflated and the findings were noted as above.   At this point and all points during the procedure, the patient's intra-abdominal pressure did not exceed 15 mmHg. Next, a 10 mm skin incision was made in the umbilicus and a right and left port was placed about 10 cm lateral to the robot port on the right and left side.  A fourth arm was placed in the left lower quadrant 2 cm above and superior and medial to the anterior superior iliac spine.  All ports were placed under direct visualization.  The patient was placed in steep Trendelenburg.  Bowel was folded away into the upper abdomen.  The robot was docked in the normal manner.  The hysterectomy was started after the round ligament on the right side was incised and the retroperitoneum was entered and the pararectal space was developed.  The ureter was noted to be on the medial leaf of the broad ligament.  The peritoneum above the ureter was incised and stretched and the infundibulopelvic ligament was skeletonized, cauterized and cut.  The posterior peritoneum was taken down to the level of the KOH ring.  The anterior peritoneum was also taken down.  The bladder flap was created to the level of the KOH ring.  The  uterine artery on the right side was skeletonized, cauterized and cut in the normal manner.  A similar procedure was performed on the left.  The left tube and ovary were amputated and placed in an endocatch bag for vaginal delivery. The colpotomy was made and the uterus, cervix, bilateral ovaries and tubes were amputated and delivered through the vagina.  Pedicles were inspected and excellent hemostasis was achieved.    The colpotomy at the vaginal cuff was closed with Vicryl on a CT1 needle in a running manner.  Irrigation was used and excellent hemostasis was achieved.  At this point in the procedure was completed.  Robotic instruments were removed under direct visulaization.  The robot was undocked. The 10 mm ports were closed with Vicryl on a UR-5 needle and the fascia was closed with 0 Vicryl on a UR-5 needle.  The skin was closed with 4-0 Vicryl in a subcuticular manner.  Dermabond was applied.  Sponge, lap and needle counts correct x 2.  The patient was taken to the recovery room in stable condition.  The vagina was swabbed with  minimal bleeding noted.   All instrument and needle counts were correct x  3.   The patient was transferred to the recovery room in a stable condition.  Donaciano Eva, MD

## 2017-06-16 NOTE — Transfer of Care (Signed)
Immediate Anesthesia Transfer of Care Note  Patient: Kelly Mendez  Procedure(s) Performed: XI ROBOTIC ASSISTED TOTAL HYSTERECTOMY WITH BILATERAL SALPINGECTOMY,  OOPHERECTOMY (Bilateral Abdomen)  Patient Location: PACU  Anesthesia Type:General  Level of Consciousness: awake, alert , oriented and patient cooperative  Airway & Oxygen Therapy: Patient Spontanous Breathing and Patient connected to face mask oxygen  Post-op Assessment: Report given to RN, Post -op Vital signs reviewed and stable and Patient moving all extremities  Post vital signs: Reviewed and stable  Last Vitals:  Vitals:   06/16/17 0525  BP: (!) 151/91  Pulse: 98  Resp: 18  Temp: 37.2 C  SpO2: 99%    Last Pain:  Vitals:   06/16/17 0525  TempSrc: Oral      Patients Stated Pain Goal: 2 (66/29/47 6546)  Complications: No apparent anesthesia complications

## 2017-06-16 NOTE — Discharge Instructions (Signed)
06/16/2017  Return to work: 4 weeks  Activity: 1. Be up and out of the bed during the day.  Take a nap if needed.  You may walk up steps but be careful and use the hand rail.  Stair climbing will tire you more than you think, you may need to stop part way and rest.   2. No lifting or straining for 6 weeks.  3. No driving for 1 weeks.  Do Not drive if you are taking narcotic pain medicine.  4. Shower daily.  Use soap and water on your incision and pat dry; don't rub.   5. No sexual activity and nothing in the vagina for 8 weeks.  Medications:  - Take ibuprofen and tylenol first line for pain control. Take these regularly (every 6 hours) to decrease the build up of pain.  - If necessary, for severe pain not relieved by ibuprofen, take percocet.  - While taking percocet you should take sennakot every night to reduce the likelihood of constipation. If this causes diarrhea, stop its use.  Diet: 1. Low sodium Heart Healthy Diet is recommended.  2. It is safe to use a laxative if you have difficulty moving your bowels.   Wound Care: 1. Keep clean and dry.  Shower daily.  Reasons to call the Doctor:   Fever - Oral temperature greater than 100.4 degrees Fahrenheit  Foul-smelling vaginal discharge  Difficulty urinating  Nausea and vomiting  Increased pain at the site of the incision that is unrelieved with pain medicine.  Difficulty breathing with or without chest pain  New calf pain especially if only on one side  Sudden, continuing increased vaginal bleeding with or without clots.   Follow-up: 1. See [name] in 4 weeks.  Contacts: For questions or concerns you should contact:  Dr. [name] at [phone] After hours and on week-ends call [phone] and ask to speak to the physician on call for Gynecologic Oncology

## 2017-06-16 NOTE — Anesthesia Preprocedure Evaluation (Addendum)
Anesthesia Evaluation  Patient identified by MRN, date of birth, ID band Patient awake    Reviewed: Allergy & Precautions, NPO status , Patient's Chart, lab work & pertinent test results  Airway Mallampati: II  TM Distance: >3 FB Neck ROM: Full    Dental  (+) Teeth Intact, Dental Advisory Given, Loose,    Pulmonary asthma , former smoker,    Pulmonary exam normal breath sounds clear to auscultation       Cardiovascular + DVT  Normal cardiovascular exam Rhythm:Regular Rate:Normal     Neuro/Psych PSYCHIATRIC DISORDERS Anxiety Family history of neurofibromatosis  Neuromuscular disease (Neuropathy)    GI/Hepatic negative GI ROS, Neg liver ROS,   Endo/Other  diabetes, Type 2, Oral Hypoglycemic AgentsMorbid obesity  Renal/GU negative Renal ROS     Musculoskeletal negative musculoskeletal ROS (+)   Abdominal   Peds  Hematology  (+) Blood dyscrasia (Xarelto), ,   Anesthesia Other Findings Day of surgery medications reviewed with the patient.  Right breast cancer  Reproductive/Obstetrics right ovarian cyst  abnormal uterine bleeding                            Anesthesia Physical Anesthesia Plan  ASA: III  Anesthesia Plan: General   Post-op Pain Management:    Induction: Intravenous  PONV Risk Score and Plan: Scopolamine patch - Pre-op, Midazolam, Dexamethasone and Ondansetron  Airway Management Planned: Oral ETT  Additional Equipment:   Intra-op Plan:   Post-operative Plan: Extubation in OR  Informed Consent: I have reviewed the patients History and Physical, chart, labs and discussed the procedure including the risks, benefits and alternatives for the proposed anesthesia with the patient or authorized representative who has indicated his/her understanding and acceptance.   Dental advisory given  Plan Discussed with: CRNA  Anesthesia Plan Comments: (Risks/benefits of general  anesthesia discussed with patient including risk of damage to teeth, lips, gum, and tongue, nausea/vomiting, allergic reactions to medications, and the possibility of heart attack, stroke and death.  All patient questions answered.  Patient wishes to proceed.)        Anesthesia Quick Evaluation

## 2017-06-17 ENCOUNTER — Encounter (HOSPITAL_COMMUNITY): Payer: Self-pay | Admitting: Gynecologic Oncology

## 2017-06-17 DIAGNOSIS — D271 Benign neoplasm of left ovary: Secondary | ICD-10-CM | POA: Diagnosis not present

## 2017-06-17 LAB — BASIC METABOLIC PANEL
Anion gap: 10 (ref 5–15)
BUN: 8 mg/dL (ref 6–20)
CO2: 22 mmol/L (ref 22–32)
Calcium: 9.4 mg/dL (ref 8.9–10.3)
Chloride: 103 mmol/L (ref 101–111)
Creatinine, Ser: 0.64 mg/dL (ref 0.44–1.00)
GFR calc Af Amer: >60 mL/min (ref >60)
GFR calc non Af Amer: >60 mL/min (ref >60)
Glucose, Bld: 139 mg/dL — ABNORMAL HIGH (ref 65–99)
Potassium: 4.1 mmol/L (ref 3.5–5.1)
Sodium: 135 mmol/L (ref 135–145)

## 2017-06-17 LAB — CBC
HCT: 39.3 % (ref 36.0–46.0)
Hemoglobin: 12.6 g/dL (ref 12.0–15.0)
MCH: 25 pg — ABNORMAL LOW (ref 26.0–34.0)
MCHC: 32.1 g/dL (ref 30.0–36.0)
MCV: 77.8 fL — ABNORMAL LOW (ref 78.0–100.0)
Platelets: 296 10*3/uL (ref 150–400)
RBC: 5.05 MIL/uL (ref 3.87–5.11)
RDW: 17.9 % — ABNORMAL HIGH (ref 11.5–15.5)
WBC: 9.1 10*3/uL (ref 4.0–10.5)

## 2017-06-17 LAB — GLUCOSE, CAPILLARY
Glucose-Capillary: 135 mg/dL — ABNORMAL HIGH (ref 65–99)
Glucose-Capillary: 124 mg/dL — ABNORMAL HIGH (ref 65–99)

## 2017-06-17 MED ORDER — SENNOSIDES-DOCUSATE SODIUM 8.6-50 MG PO TABS: 2.0000 | ORAL_TABLET | Freq: Every day | ORAL | 1 refills | Status: DC

## 2017-06-17 MED ORDER — OXYCODONE-ACETAMINOPHEN 5-325 MG PO TABS: 1.0000 | ORAL_TABLET | ORAL | 0 refills | Status: DC | PRN

## 2017-06-17 NOTE — Discharge Summary (Signed)
Physician Discharge Summary  Patient ID: Kelly Mendez MRN: [id number] DOB/AGE: August 30, 1975 41 y.o.  Admit date: 06/16/2017 Discharge date: 06/17/2017  Admission Diagnoses: Right ovarian cyst  Discharge Diagnoses:  Principal Problem:   Right ovarian cyst Active Problems:   Pelvic mass in female   Discharged Condition: good  Hospital Course:  1/ patient was admitted on 06/17/17 for robotic hysterectomy, BSO for an ovarian cyst 2/ surgery was uncomplicated  3/ on postoperative day 1 the patient was meeting discharge criteria: tolerating PO, voiding urine, ambulating, pain well controlled on oral medications.  4/ new medications on discharge include percocet and sennakot.   Consults: None  Significant Diagnostic Studies: labs:  CBC    Component Value Date/Time   WBC 9.1 06/17/2017 0541   RBC 5.05 06/17/2017 0541   HGB 12.6 06/17/2017 0541   HGB 12.1 05/07/2017 1221   HCT 39.3 06/17/2017 0541   HCT 38.7 05/07/2017 1221   PLT 296 06/17/2017 0541   PLT 258 05/07/2017 1221   MCV 77.8 (L) 06/17/2017 0541   MCV 76.6 (L) 05/07/2017 1221   MCH 25.0 (L) 06/17/2017 0541   MCHC 32.1 06/17/2017 0541   RDW 17.9 (H) 06/17/2017 0541   RDW 15.6 (H) 05/07/2017 1221   LYMPHSABS 0.5 (L) 05/07/2017 1221   MONOABS 0.3 05/07/2017 1221   EOSABS 0.1 05/07/2017 1221   BASOSABS 0.0 05/07/2017 1221     Treatments: surgery: see above  Discharge Exam: Blood pressure (!) 144/95, pulse 80, temperature 98.4 F (36.9 C), temperature source Oral, resp. rate 18, height 6\' 2"  (1.88 m), weight (!) 317 lb (143.8 kg), last menstrual period 12/16/2016, SpO2 97 %. General appearance: alert and cooperative GI: soft, non-tender; bowel sounds normal; no masses,  no organomegaly Incision/Wound: clean and intact x 5  Disposition: 01-Home or Self Care   Allergies as of 06/17/2017      Reactions   Penicillins Other (See Comments)   As a child Has patient had a PCN reaction causing immediate rash,  facial/tongue/throat swelling, SOB or lightheadedness with hypotension: unknown Has patient had a PCN reaction causing severe rash involving mucus membranes or skin necrosis: unknown Has patient had a PCN reaction that required hospitalization unknown Has patient had a PCN reaction occurring within the last 10 years: no If all of the above answers are "NO", then may proceed with Cephalosporin use.      Medication List    TAKE these medications   atorvastatin 20 MG tablet Commonly known as:  LIPITOR Take 1 tablet (20 mg total) by mouth daily.   BIOTIN PO Take 1 capsule by mouth daily.   BLOOD GLUCOSE TEST STRIPS Strp Test twice a day. Pt uses one touch verio flex meter   gabapentin 300 MG capsule Commonly known as:  NEURONTIN Take 1 capsule (300 mg total) by mouth at bedtime.   ibuprofen 200 MG tablet Commonly known as:  ADVIL,MOTRIN Take 400 mg by mouth daily as needed for headache or moderate pain.   metFORMIN 1000 MG tablet Commonly known as:  GLUCOPHAGE TAKE 1 TABLET BY MOUTH TWICE DAILY WITH A MEAL What changed:    how much to take  how to take this  when to take this  additional instructions   ONETOUCH DELICA LANCETS FINE Misc Test twice a day   oxyCODONE-acetaminophen 5-325 MG tablet Commonly known as:  PERCOCET/ROXICET Take 1-2 tablets by mouth every 4 (four) hours as needed (moderate to severe pain (when tolerating fluids)).   RADIAPLEX EX Apply 1  application topically as needed (for chaffing).   senna-docusate 8.6-50 MG tablet Commonly known as:  Senokot-S Take 2 tablets by mouth at bedtime.   TRULICITY 2.82 OJ/7.5FM Sopn Generic drug:  Dulaglutide INJECT 1  SUBCUTANEOUSLY ONCE A WEEK What changed:  See the new instructions.   XARELTO 20 MG Tabs tablet Generic drug:  rivaroxaban TAKE 1 TABLET BY MOUTH ONCE DAILY WITH SUPPER What changed:  See the new instructions.      Follow-up Information    [name], MD Follow up in 4 week(s).    Specialty:  Obstetrics and Gynecology Contact information: [address] [place] [address] [phone]           Signed: Donaciano Eva 06/17/2017, 8:46 AM

## 2017-06-17 NOTE — Progress Notes (Signed)
Discharge instructions discussed with patient and family, verbalized agreement and understanding, prescriptions given to family 

## 2017-06-19 ENCOUNTER — Ambulatory Visit
Admission: RE | Admit: 2017-06-19 | Payer: Managed Care, Other (non HMO) | Source: Ambulatory Visit | Admitting: Radiation Oncology

## 2017-06-19 HISTORY — DX: Personal history of irradiation: Z92.3

## 2017-06-23 ENCOUNTER — Encounter: Payer: Self-pay | Admitting: Oncology

## 2017-06-23 ENCOUNTER — Encounter (INDEPENDENT_AMBULATORY_CARE_PROVIDER_SITE_OTHER): Payer: Self-pay

## 2017-06-24 ENCOUNTER — Ambulatory Visit (INDEPENDENT_AMBULATORY_CARE_PROVIDER_SITE_OTHER): Payer: Managed Care, Other (non HMO) | Admitting: Family Medicine

## 2017-06-24 ENCOUNTER — Encounter: Payer: Self-pay | Admitting: Family Medicine

## 2017-06-24 VITALS — BP 132/80 | HR 87 | Resp 16 | Wt 315.2 lb

## 2017-06-24 DIAGNOSIS — E118 Type 2 diabetes mellitus with unspecified complications: Secondary | ICD-10-CM

## 2017-06-24 NOTE — Progress Notes (Signed)
  Subjective:    Patient ID: Kelly Mendez, female    DOB: 20-Dec-1975, 41 y.o.   MRN: [id number]  Kelly Mendez is a 41 y.o. female who presents for follow-up of Type 2 diabetes mellitus.  She had a hysterectomy approximately 1 week ago and states she is doing well.  She had a hemoglobin A1c done prior to surgery and it was in goal range at 6.4%.  Denies any symptoms or problems today. She is checking her blood sugars at home and they are consistent with her A1c. She is up-to-date with eye exam.  States her cancer is technically in remission. Has follow up appt on January 24th with Dr. Jana Hakim.   Denies fever, chills, vision changes, chest pain, palpitations, shortness of breath, cough, abdominal pain, nausea, vomiting, diarrhea, urinary symptoms.   The following portions of the patient's history were reviewed and updated as appropriate: allergies, current medications, past medical history, past social history and problem list.  ROS as in subjective above.     Objective:    Physical Exam Alert and in no distress otherwise not examined.  Blood pressure 132/80, pulse 87, resp. rate 16, weight (!) 315 lb 3.2 oz (143 kg), last menstrual period 01/10/2017, SpO2 98 %.  Lab Review Diabetic Labs Latest Ref Rng & Units 06/17/2017 06/12/2017 05/07/2017 03/25/2017 03/06/2017  HbA1c 4.8 - 5.6 % - 6.4(H) - 7.8% -  Microalbumin Not estab mg/dL - - - - -  Micro/Creat Ratio <30 mcg/mg creat - - - - -  Chol <200 mg/dL - - - 120 -  HDL >50 mg/dL - - - 31(L) -  Calc LDL <100 mg/dL - - - - -  Triglycerides <150 mg/dL - - - 101 -  Creatinine 0.44 - 1.00 mg/dL 0.64 0.75 0.8 - 0.8   BP/Weight 06/24/2017 06/17/2017 06/16/2017 06/12/2017 46/27/0350  Systolic BP 093 818 - 299 371  Diastolic BP 80 95 - 90 99  Wt. (Lbs) 315.2 - 317 317 -  BMI 40.47 - 40.7 40.7 -   No flowsheet data found.  [place]  reports that she quit smoking about 6 months ago. Her smoking use included cigarettes. She has a 3.75  pack-year smoking history. she has never used smokeless tobacco. She reports that she drinks about 0.6 oz of alcohol per week. She reports that she does not use drugs.     Assessment & Plan:    Controlled type 2 diabetes mellitus with complication, without long-term current use of insulin ([place])  1. Rx changes: none hemoglobin A1c 6.4% last week.  She is doing well on current medication regimen and will continue. 2. Education: Reviewed 'ABCs' of diabetes management (respective goals in parentheses):  A1C (<7), blood pressure (<130/80), and cholesterol (LDL <100). 3. Compliance at present is estimated to be excellent. Efforts to improve compliance (if necessary) will be directed at dietary modifications: advised her to count calories and carbohydrates.  and increased exercise. 4. Follow up: Mid-March for fasting CPE and diabetes follow-up.

## 2017-06-26 ENCOUNTER — Ambulatory Visit
Admission: RE | Admit: 2017-06-26 | Discharge: 2017-06-26 | Disposition: A | Discharge status: Home / Self Care | Payer: Managed Care, Other (non HMO) | Source: Ambulatory Visit | Attending: Radiation Oncology | Admitting: Radiation Oncology

## 2017-06-26 ENCOUNTER — Encounter: Payer: Self-pay | Admitting: Radiation Oncology

## 2017-06-26 DIAGNOSIS — Z510 Encounter for antineoplastic radiation therapy: Secondary | ICD-10-CM | POA: Diagnosis not present

## 2017-06-26 DIAGNOSIS — Z171 Estrogen receptor negative status [ER-]: Secondary | ICD-10-CM

## 2017-06-26 DIAGNOSIS — C50211 Malignant neoplasm of upper-inner quadrant of right female breast: Secondary | ICD-10-CM

## 2017-06-26 NOTE — Progress Notes (Signed)
Radiation Oncology         [phone] ________________________________  Name: Kelly Mendez MRN: [id number]  Date: 06/26/2017  DOB: 1976/06/15  Follow-Up Visit Note  Outpatient  CC: [name], NP-C  Magrinat, Virgie Dad, MD  Diagnosis:      ICD-10-CM   1. 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  She is doing well. She denies any new palpable lumps or bumps in her breasts. She continues to be followed by medical oncology and will follow up with Dr. Jana Hakim on 08/06/2017.  On review of systems, she reports pain to her abdomen from her recent hysterectomy. She will see Dr. Denman George on 07/08/2017. She reports continued skin issues and is using Sonafine once daily.                             ALLERGIES:  is allergic to penicillins.  Meds: Current Outpatient Medications  Medication Sig Dispense Refill  . atorvastatin (LIPITOR) 20 MG tablet Take 1 tablet (20 mg total) by mouth daily. 30 tablet 2  . BIOTIN PO Take 1 capsule by mouth daily.    Marland Kitchen gabapentin (NEURONTIN) 300 MG capsule Take 1 capsule (300 mg total) by mouth at bedtime. 90 capsule 4  . Glucose Blood (BLOOD GLUCOSE TEST STRIPS) STRP Test twice a day. Pt uses one touch verio flex meter 100 each 5  . ibuprofen (ADVIL,MOTRIN) 200 MG tablet Take 400 mg by mouth daily as needed for headache or moderate pain.     . metFORMIN (GLUCOPHAGE) 1000 MG tablet TAKE 1 TABLET BY MOUTH TWICE DAILY WITH A MEAL (Patient taking differently: Take 1,000 mg by mouth 2 (two) times daily with a meal. ) 60 tablet 2    . ONETOUCH DELICA LANCETS FINE MISC Test twice a day 100 each 5  . senna-docusate (SENOKOT-S) 8.6-50 MG tablet Take 2 tablets by mouth at bedtime. 50 tablet 1  . TRULICITY 7.98 XQ/1.1HE SOPN INJECT 1  SUBCUTANEOUSLY ONCE A WEEK (Patient taking differently: Inject 0.75 mg SQ once weekly on Sunday) 12 pen 0  . XARELTO 20 MG TABS tablet TAKE 1 TABLET BY MOUTH ONCE DAILY WITH SUPPER (Patient taking differently: TAKE 20 MG BY MOUTH ONCE DAILY WITH SUPPER) 30 tablet 3  . oxyCODONE-acetaminophen (PERCOCET/ROXICET) 5-325 MG tablet Take 1-2 tablets by mouth every 4 (four) hours as needed (moderate to severe pain (when tolerating fluids)). (Patient not taking: Reported on 06/24/2017) 30 tablet 0   No current facility-administered medications for this encounter.      Physical Findings:  height is '6\' 2"'  (1.88 m) and weight is 316 lb (143.3 kg) (abnormal). Her temperature is 98.2 F (36.8 C). Her blood pressure is 131/102 (abnormal) and her pulse is 90. Her oxygen saturation is 100%.    General: Alert and oriented, in no acute distress BREASTS: Over her right breast she has resolving hyperpigmentation and dryness. Skin intact.      Lab Findings: Lab Results  Component Value Date   WBC 9.1 06/17/2017   HGB 12.6 06/17/2017  HCT 39.3 06/17/2017   MCV 77.8 (L) 06/17/2017   PLT 296 06/17/2017    '@LASTCHEMISTRY' @  Radiographic Findings: US Pelvis Transvanginal Non-ob (tv Only)  Result Date: 06/03/2017 CLINICAL DATA:  Right ovarian complex mass. EXAM: TRANSABDOMINAL AND TRANSVAGINAL ULTRASOUND OF PELVIS TECHNIQUE: Both transabdominal and transvaginal ultrasound examinations of the pelvis were performed. Transabdominal technique was performed for global imaging of the pelvis including uterus, ovaries, adnexal regions, and pelvic cul-de-sac. It was necessary to proceed with endovaginal exam following the transabdominal exam to visualize the endometrium and ovaries. COMPARISON:  Ultrasound of December 16, 2016. FINDINGS: Uterus Measurements: 7.1 x 4.3 x 3.7 cm. Probable 1.9 cm fibroid is noted posteriorly. Endometrium Thickness: 7 mm which is within normal limits. No focal abnormality visualized. Right ovary Large multi-septated predominantly cystic mass is again noted measuring 12.9 x 11.6 x 8.9 cm. Probable peripheral solid component is noted as well. Left ovary Not visualized. Other findings No abnormal free fluid. IMPRESSION: Continued presence of 12.9 cm multi-septated predominantly cystic mass in the right adnexal region which contains probable peripheral solid component. 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 MRI may be performed for further evaluation as well. Electronically Signed   By: Marijo Conception, M.D.   On: 06/03/2017 08:41   US Pelvis (transabdominal Only)  Result Date: 06/03/2017 CLINICAL DATA:  Right ovarian complex mass. EXAM: TRANSABDOMINAL AND TRANSVAGINAL ULTRASOUND OF PELVIS TECHNIQUE: Both transabdominal and transvaginal ultrasound examinations of the pelvis were performed. Transabdominal technique was performed for global imaging of the pelvis including uterus, ovaries, adnexal regions, and pelvic cul-de-sac. It was necessary to proceed with endovaginal exam following the transabdominal exam to visualize the endometrium and ovaries. COMPARISON:  Ultrasound of December 16, 2016. FINDINGS: Uterus Measurements: 7.1 x 4.3 x 3.7 cm. Probable 1.9 cm fibroid is noted posteriorly. Endometrium Thickness: 7 mm which is within normal limits. No focal abnormality visualized. Right ovary Large multi-septated predominantly cystic mass is again noted measuring 12.9 x 11.6 x 8.9 cm. Probable peripheral solid component is noted as well. Left ovary Not visualized. Other findings No abnormal free fluid. IMPRESSION: Continued presence of 12.9 cm multi-septated predominantly cystic mass in the right adnexal region which  contains probable peripheral solid component. 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 MRI may be performed for further evaluation as well. Electronically Signed   By: Marijo Conception, M.D.   On: 06/03/2017 08:41    Impression/Plan: This is a very pleasant woman with a history of right breast cancer. I advised her to apply vitamin E oil to her skin to promote further healing. She knows to continue yearly mammography. I will see her back prn.  I encouraged her to call if she has any issues in the interim.     _____________________________________   Eppie Gibson, MD  This document serves as a record of services personally performed by Eppie Gibson, MD. It was created on her behalf by Rae Lips, a trained medical scribe. The creation of this record is based on the scribe's personal observations and the provider's statements to them. This document has been checked and approved by the attending provider.

## 2017-06-26 NOTE — Progress Notes (Signed)
Kelly Mendez presents for follow up of radiation completed 05/20/17 to her Right Breast. She reports pain to her abdomen from her recent hysterectomy. Her Right Breast has hyperpigmentation present. She is using using sonafine once daily. She knows to use a vitamin E cream as needed when her sonafine finishes. She will see Dr. Jana Hakim 08/06/17 and Dr. Denman George on 07/08/17.  BP (!) 131/102   Pulse 90   Temp 98.2 F (36.8 C)   Ht 6\' 2"  (1.88 m)   Wt (!) 316 lb (143.3 kg)   LMP 01/10/2017   SpO2 100% Comment: room air  BMI 40.57 kg/m    Wt Readings from Last 3 Encounters:  06/26/17 (!) 316 lb (143.3 kg)  06/24/17 (!) 315 lb 3.2 oz (143 kg)  06/16/17 (!) 317 lb (143.8 kg)

## 2017-06-27 ENCOUNTER — Encounter: Payer: Self-pay | Admitting: Radiation Oncology

## 2017-07-02 ENCOUNTER — Inpatient Hospital Stay: Payer: Managed Care, Other (non HMO) | Attending: Oncology

## 2017-07-03 ENCOUNTER — Other Ambulatory Visit: Payer: Self-pay | Admitting: Family Medicine

## 2017-07-03 DIAGNOSIS — E119 Type 2 diabetes mellitus without complications: Secondary | ICD-10-CM

## 2017-07-08 ENCOUNTER — Ambulatory Visit: Payer: Managed Care, Other (non HMO) | Attending: Gynecologic Oncology | Admitting: Gynecologic Oncology

## 2017-07-08 VITALS — BP 127/87 | HR 94 | Temp 98.3°F | Resp 20 | Wt 313.2 lb

## 2017-07-08 DIAGNOSIS — Z09 Encounter for follow-up examination after completed treatment for conditions other than malignant neoplasm: Secondary | ICD-10-CM | POA: Diagnosis not present

## 2017-07-08 DIAGNOSIS — Z9079 Acquired absence of other genital organ(s): Secondary | ICD-10-CM | POA: Diagnosis not present

## 2017-07-08 DIAGNOSIS — Z90722 Acquired absence of ovaries, bilateral: Secondary | ICD-10-CM | POA: Insufficient documentation

## 2017-07-08 DIAGNOSIS — N83201 Unspecified ovarian cyst, right side: Secondary | ICD-10-CM

## 2017-07-08 DIAGNOSIS — Z853 Personal history of malignant neoplasm of breast: Secondary | ICD-10-CM | POA: Diagnosis not present

## 2017-07-08 DIAGNOSIS — Z90710 Acquired absence of both cervix and uterus: Secondary | ICD-10-CM | POA: Insufficient documentation

## 2017-07-08 NOTE — Progress Notes (Signed)
  HPI:  Kelly Mendez is a 41 y.o. year old with a history of breast cancer initially seen in consultation in June, 2018 for a right ovarian mass and abnormal uterine bleeding.  She then underwent a robotic hysterectomy and BSO on 93/2/67 without complications.  Her postoperative course was uncomplicated.  Her final pathology revealed bilateral cystadenomas (benign) and adenomyosis and fibroids of the uterus.  She is seen today for a postoperative check and to discuss her pathology results and ongoing plan.  Since discharge from the hospital, she is feeling well.  She has improving appetite, normal bowel and bladder function, and pain controlled with minimal PO medication. She has no other complaints today.    Review of systems: Constitutional:  She has no weight gain or weight loss. She has no fever or chills. Eyes: No blurred vision Ears, Nose, Mouth, Throat: No dizziness, headaches or changes in hearing. No mouth sores. Cardiovascular: No chest pain, palpitations or edema. Respiratory:  No shortness of breath, wheezing or cough Gastrointestinal: She has normal bowel movements without diarrhea or constipation. She denies any nausea or vomiting. She denies blood in her stool or heart burn. Genitourinary:  She denies pelvic pain, pelvic pressure or changes in her urinary function. She has no hematuria, dysuria, or incontinence. She has no irregular vaginal bleeding or vaginal discharge Musculoskeletal: Denies muscle weakness or joint pains.  Skin:  She has no skin changes, rashes or itching Neurological:  Denies dizziness or headaches. No neuropathy, no numbness or tingling. Psychiatric:  She denies depression or anxiety. Hematologic/Lymphatic:   No easy bruising or bleeding   Physical Exam: Blood pressure 127/87, pulse 94, temperature 98.3 F (36.8 C), temperature source Oral, resp. rate 20, weight (!) 313 lb 3.2 oz (142.1 kg), last menstrual period 01/10/2017, SpO2 98 %. General: Well  dressed, well nourished in no apparent distress.   HEENT:  Normocephalic and atraumatic, no lesions.  Extraocular muscles intact. Sclerae anicteric. Pupils equal, round, reactive. No mouth sores or ulcers. Thyroid is normal size, not nodular, midline. Abdomen:  Soft, nontender, nondistended.  No palpable masses.  No hepatosplenomegaly.  No ascites. Normal bowel sounds.  No hernias.  Incisions are well healed Genitourinary: Normal EGBUS  Vaginal cuff intact.  No bleeding or discharge.  No cul de sac fullness. Extremities: No cyanosis, clubbing or edema.  No calf tenderness or erythema. No palpable cords. Psychiatric: Mood and affect are appropriate. Neurological: Awake, alert and oriented x 3. Sensation is intact, no neuropathy.  Musculoskeletal: No pain, normal strength and range of motion.  Assessment:    41 y.o. year old with a history of adenomyosis and fibroids and bilateral benign ovarian cysts in the setting of breast cancer.   S/p robotic hysterectomy, BSO on 06/16/17.   Plan: 1) Pathology reports reviewed today 2) Treatment counseling - I discussed her surgery and normal recovery expectations She was given the opportunity to ask questions, which were answered to her satisfaction, and she is agreement with the above mentioned plan of care.  3)  Return to clinic on a prn basis.  Donaciano Eva, MD

## 2017-07-08 NOTE — Patient Instructions (Signed)
No need for follow up with Dr. Denman George.  Please call for any questions or concerns.

## 2017-07-30 ENCOUNTER — Inpatient Hospital Stay: Payer: Managed Care, Other (non HMO)

## 2017-07-30 ENCOUNTER — Inpatient Hospital Stay: Payer: Managed Care, Other (non HMO) | Attending: Oncology

## 2017-07-30 DIAGNOSIS — N83201 Unspecified ovarian cyst, right side: Secondary | ICD-10-CM | POA: Insufficient documentation

## 2017-07-30 DIAGNOSIS — C50211 Malignant neoplasm of upper-inner quadrant of right female breast: Secondary | ICD-10-CM | POA: Insufficient documentation

## 2017-07-30 DIAGNOSIS — R61 Generalized hyperhidrosis: Secondary | ICD-10-CM | POA: Insufficient documentation

## 2017-07-30 DIAGNOSIS — G629 Polyneuropathy, unspecified: Secondary | ICD-10-CM | POA: Diagnosis not present

## 2017-07-30 DIAGNOSIS — N951 Menopausal and female climacteric states: Secondary | ICD-10-CM | POA: Insufficient documentation

## 2017-07-30 DIAGNOSIS — Z171 Estrogen receptor negative status [ER-]: Secondary | ICD-10-CM

## 2017-07-30 DIAGNOSIS — Z90710 Acquired absence of both cervix and uterus: Secondary | ICD-10-CM | POA: Insufficient documentation

## 2017-07-30 LAB — COMPREHENSIVE METABOLIC PANEL
ALT: 23 U/L (ref 0–55)
AST: 16 U/L (ref 5–34)
Albumin: 3.5 g/dL (ref 3.5–5.0)
Alkaline Phosphatase: 132 U/L (ref 40–150)
Anion gap: 10 (ref 3–11)
BUN: 9 mg/dL (ref 7–26)
CO2: 25 mmol/L (ref 22–29)
Calcium: 9.7 mg/dL (ref 8.4–10.4)
Chloride: 105 mmol/L (ref 98–109)
Creatinine, Ser: 0.8 mg/dL (ref 0.60–1.10)
GFR calc Af Amer: >60 mL/min (ref >60)
GFR calc non Af Amer: >60 mL/min (ref >60)
Glucose, Bld: 127 mg/dL (ref 70–140)
Potassium: 3.8 mmol/L (ref 3.3–4.7)
Sodium: 140 mmol/L (ref 136–145)
Total Bilirubin: 0.3 mg/dL (ref 0.2–1.2)
Total Protein: 7.1 g/dL (ref 6.4–8.3)

## 2017-07-30 LAB — CBC WITH DIFFERENTIAL/PLATELET
Basophils Absolute: 0 10*3/uL (ref 0.0–0.1)
Basophils Relative: 1 %
Eosinophils Absolute: 0.2 10*3/uL (ref 0.0–0.5)
Eosinophils Relative: 2 %
HCT: 39 % (ref 34.8–46.6)
Hemoglobin: 12.7 g/dL (ref 11.6–15.9)
Lymphocytes Relative: 16 %
Lymphs Abs: 1.2 10*3/uL (ref 0.9–3.3)
MCH: 25.4 pg (ref 25.1–34.0)
MCHC: 32.6 g/dL (ref 31.5–36.0)
MCV: 78 fL — ABNORMAL LOW (ref 79.5–101.0)
Monocytes Absolute: 0.3 10*3/uL (ref 0.1–0.9)
Monocytes Relative: 4 %
Neutro Abs: 5.9 10*3/uL (ref 1.5–6.5)
Neutrophils Relative %: 77 %
Platelets: 260 10*3/uL (ref 145–400)
RBC: 5 MIL/uL (ref 3.70–5.45)
RDW: 20.5 % — ABNORMAL HIGH (ref 11.2–16.1)
WBC: 7.6 10*3/uL (ref 3.9–10.3)

## 2017-08-05 NOTE — Progress Notes (Signed)
[facility]  [phone] [phone]     ID: Kelly Mendez DOB: 1976-04-16  MR#: [id number]  IOM#:[id number]  Patient Care Team: [name], NP-C as PCP - General (Family Medicine) [name], MD as Consulting Physician (General Surgery) Mellissa Conley, Virgie Dad, MD as Consulting Physician (Oncology) Eppie Gibson, MD as Attending Physician (Radiation Oncology) OTHER MD:  CHIEF COMPLAINT: Triple negative breast cancer/ DVT  CURRENT TREATMENT: Observation   BREAST CANCER HISTORY: From the original intake note:  Kelly Mendez had her first ever mammogram 10/07/2016 at the [facility]. This showed a possible mass in the right breast. On 10/13/2016 she underwent bilateral diagnostic mammography with tomography and right breast ultrasonography. This found the breast density to be category B. In the upper inner quadrant of the right breast there was a circumscribed mass which was barely palpable. Ultrasonography confirmed a 0.9 cm right breast mass at the 1:00 radiant 18 cm from the nipple ultrasound of the axilla showed 1 indeterminate right axillary lymph node with borderline thickening of the anterior cortex.  On 10/16/2016 the patient underwent biopsy of the right breast mass and the suspicious right axillary lymph node. The right breast mass proved to be an invasive ductal carcinoma, grade 3, estrogen and progesterone receptor negative, with an MIB-1 of 80%, and no HER-2 amplification, the signals ratio being 1.30 and the number per cell 1.75. The lymph node was negative and concordant.  Her subsequent history is as detailed below  INTERVAL HISTORY: Kelly Mendez returns today for follow-up and treatment of her triple negative breast cancer accompanied by her mother. She completed radiation treatments 05/20/2017. She tolerated this well. She notes that she has started taking gabapentin again to help with sleep and neuropathy in the hands in feet. Instead of  numbness, she feels a sharp pain in her feet 3-4 times per day and in her hands 1-2 times per day.   Since her last visit, she completed an transabdominal and transvaginal ultrasound of the pelvis on 06/02/2017 showing: Continued presence of 12.9 cm multi-septated predominantly cystic mass in the right adnexal region which contains probable peripheral solid component. This is concerning for cystic ovarian neoplasm or possibly malignancy.   Her goserelin treatments were stopped late November, and she proceeded to hysterectomy and bilateral salpingo-oophorectomy 06/16/2017 with results showing ([id number]) : benign serous cystadenofibroma . Paratubal cyst. Benign fallopian tube. Biopsy of the right fallopian tube, uterus and cervix showed: proliferative endometrium, no hyperplasia or malignancy. Myometrium: Leiomyomata. Adenomyosis and no malignancy. Unremarkable serosa. Benign squamous and endocervical mucosa with no dysplasia or malignancy. The right ovary and fallopian tube are unremarkable with no malignancy.   REVIEW OF SYSTEMS: Kelly Mendez reports that her daughter is doing well and is studying organic chemistry. She note that she had a hysterectomy with BSO in December 2018, She denies having any complications or bleeding issues from the surgery. She notes that she had some minimal spotting. She notes that she cut her finger recently and had to hold pressure on it to stop the bleeding. She notes that she has about 4-5 night sweats that wake her up at night. She also experiences hot flashes during the day. She notes that she has been taking gabapentin to aid with sleeping and neuropathy in her hands and feet. She notes that she is sleeping some now, and she is able to track her sleeping patterns through her FitBit. She notes that she is currently not working. She denies unusual headaches, visual changes, nausea, vomiting, or dizziness.  There has been no unusual cough, phlegm production, or pleurisy. This been  no change in bowel or bladder habits. She denies unexplained fatigue or unexplained weight loss, bleeding, rash, or fever. A detailed review of systems was otherwise stable.    PAST MEDICAL HISTORY: Past Medical History:  Diagnosis Date  . Abnormal Pap smear of cervix   . Anxiety   . Asthma   . 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 Concussion    around age 52  . Diabetes type 2, uncontrolled ([place])    new diagnosis in 08/2016  . DVT (deep venous thrombosis) ([place]) 11/24/2016   in rt arm  . Family history of breast cancer   . Family history of neurofibromatosis   . Family history of ovarian cancer   . History of radiation therapy 04/21/17- 05/20/17   Right Breast 40.05 Gy in 15 fractions followed by a 10 Gy boost in 5 fractions to yield a total dose of 50.05 Gy  . HPV in female   . Hyperlipemia   . Neuromuscular disorder (HCC)    neuropathy  . Pneumonia    2014   . PTSD (post-traumatic stress disorder)     PAST SURGICAL HISTORY: Past Surgical History:  Procedure Laterality Date  . BREAST LUMPECTOMY WITH RADIOACTIVE SEED AND SENTINEL LYMPH NODE BIOPSY Right 03/18/2017   Procedure: RIGHT BREAST LUMPECTOMY WITH RADIOACTIVE SEED AND RIGHT SENTINEL LYMPH NODE BIOPSY;  Surgeon: [name], MD;  Location: [facility];  Service: General;  Laterality: Right;  . COLPOSCOPY    . left ovary removed  1978  . PORTACATH PLACEMENT Right 10/29/2016   Procedure: INSERTION PORT-A-CATH WITH [place];  Surgeon: [name], MD;  Location: [facility];  Service: General;  Laterality: Right;  . portacath removal     oct. 5 2018  . ROBOTIC ASSISTED TOTAL HYSTERECTOMY Bilateral 06/16/2017   Procedure: XI ROBOTIC ASSISTED TOTAL HYSTERECTOMY WITH BILATERAL SALPINGECTOMY,  OOPHERECTOMY;  Surgeon: [name], MD;  Location: [facility];  Service: Gynecology;  Laterality: Bilateral;    FAMILY HISTORY Family History  Problem Relation Age of Onset  . Breast cancer Mother  51  . Hepatitis C Father   . Ovarian cancer Maternal Aunt        dx in her 85s  . Neurofibromatosis Maternal Uncle   . Brain cancer Maternal Uncle 58  . Lung cancer Maternal Grandfather   . Kidney failure Paternal Grandmother   . Heart attack Paternal Grandfather   . Ovarian cancer Maternal Aunt        dx in her 43s  . Neurofibromatosis Maternal Aunt   . Ovarian cancer Maternal Aunt   . Neurofibromatosis Maternal Aunt   . Cervical cancer Maternal Aunt   . Neurofibromatosis Maternal Aunt   . Cancer Maternal Aunt        cancer on the bottom of her foot  . Breast cancer Other        MGF's sisters  . Neurofibromatosis Other        MGM's paternal aunt  The patient's mother was diagnosed with breast cancer at age 65, but tells me the lump in her breast had been present for at least 10 years prior to that. She is now 53 and doing well. The patient's father died at the age of 67 from sepsis following liver transplantation. The patient has 2 brothers, no sisters. The patient tells me that she has at least 5  and sent cousins with breast cancer and there are other relatives with uterine cancer.  GYNECOLOGIC HISTORY:  Patient's last menstrual period was 01/10/2017. Menarche age 61, first live birth age 8, the patient is GX P1. She has had multiple progesterone and oral contraceptive treatments through [facility] because of her menometrorrhagia. 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 Her husband Kelly Mendez is a truck Geophysicist/field seismologist. The patient's daughter Kelly Mendez is studying pre-veterinary medicine in college. Kelly Mendez has 3 children, all boys, a keen is a cook in [place] and lives independently. The 2 younger boys, Kelly Mendez and Kelly Mendez, aged 55 and 58 as of April 2018, are at home with the patient    ADVANCED DIRECTIVES: Not in place   HEALTH MAINTENANCE: Social History   Tobacco Use  . Smoking status: Former Smoker      Packs/day: 0.15    Years: 25.00    Pack years: 3.75    Types: Cigarettes    Last attempt to quit: 11/25/2016    Years since quitting: 0.6  . Smokeless tobacco: Never Used  Substance Use Topics  . Alcohol use: Yes    Alcohol/week: 0.6 oz    Types: 1 Glasses of wine per week    Comment: rarely   . Drug use: No     Colonoscopy: n/a  PAP:  Bone density:   Allergies  Allergen Reactions  . Penicillins Other (See Comments)    As a child Has patient had a PCN reaction causing immediate rash, facial/tongue/throat swelling, SOB or lightheadedness with hypotension: unknown Has patient had a PCN reaction causing severe rash involving mucus membranes or skin necrosis: unknown Has patient had a PCN reaction that required hospitalization unknown Has patient had a PCN reaction occurring within the last 10 years: no If all of the above answers are "NO", then may proceed with Cephalosporin use.     Current Outpatient Medications  Medication Sig Dispense Refill  . atorvastatin (LIPITOR) 20 MG tablet Take 1 tablet (20 mg total) by mouth daily. 30 tablet 2  . BIOTIN PO Take 1 capsule by mouth daily.    Marland Kitchen gabapentin (NEURONTIN) 300 MG capsule Take 1 capsule (300 mg total) by mouth at bedtime. 90 capsule 4  . Glucose Blood (BLOOD GLUCOSE TEST STRIPS) STRP Test twice a day. Pt uses one touch verio flex meter 100 each 5  . ibuprofen (ADVIL,MOTRIN) 200 MG tablet Take 400 mg by mouth daily as needed for headache or moderate pain.     . metFORMIN (GLUCOPHAGE) 1000 MG tablet TAKE 1 TABLET BY MOUTH TWICE DAILY WITH MEALS 60 tablet 2  . ONETOUCH DELICA LANCETS FINE MISC Test twice a day 100 each 5  . senna-docusate (SENOKOT-S) 8.6-50 MG tablet Take 2 tablets by mouth at bedtime. 50 tablet 1  . TRULICITY 2.83 TD/1.7OH SOPN INJECT 1  SUBCUTANEOUSLY ONCE A WEEK (Patient taking differently: Inject 0.75 mg SQ once weekly on Sunday) 12 pen 0  . XARELTO 20 MG TABS tablet TAKE 1 TABLET BY MOUTH ONCE DAILY WITH  SUPPER (Patient taking differently: TAKE 20 MG BY MOUTH ONCE DAILY WITH SUPPER) 30 tablet 3   No current facility-administered medications for this visit.     OBJECTIVE: Young-appearing African-American woman in no acute distress  Vitals:   08/06/17 1420  BP: 97/67  Pulse: 100  Resp: 20  Temp: 98.9 F (37.2 C)  SpO2: 100%     Body mass index  is 41.3 kg/m.    ECOG FS:0 - Asymptomatic  Sclerae unicteric, pupils round and equal Oropharynx clear and moist No cervical or supraclavicular adenopathy Lungs no rales or rhonchi Heart regular rate and rhythm Abd soft, nontender, positive bowel sounds MSK no focal spinal tenderness, no upper extremity lymphedema Neuro: nonfocal, well oriented, appropriate affect Breasts: The right breast is status post lumpectomy and radiation.  The cosmetic result is good.  There is minimal indentation at the site of the incision, and then a keloid associated with the prior port site.  The breast is unremarkable.  Both axillae are benign.  LAB RESULTS:  CMP     Component Value Date/Time   NA 140 07/30/2017 1414   NA 140 05/07/2017 1221   K 3.8 07/30/2017 1414   K 4.0 05/07/2017 1221   CL 105 07/30/2017 1414   CO2 25 07/30/2017 1414   CO2 24 05/07/2017 1221   GLUCOSE 127 07/30/2017 1414   GLUCOSE 173 (H) 05/07/2017 1221   BUN 9 07/30/2017 1414   BUN 6.7 (L) 05/07/2017 1221   CREATININE 0.80 07/30/2017 1414   CREATININE 0.8 05/07/2017 1221   CALCIUM 9.7 07/30/2017 1414   CALCIUM 9.7 05/07/2017 1221   PROT 7.1 07/30/2017 1414   PROT 6.8 05/07/2017 1221   ALBUMIN 3.5 07/30/2017 1414   ALBUMIN 3.4 (L) 05/07/2017 1221   AST 16 07/30/2017 1414   AST 21 05/07/2017 1221   ALT 23 07/30/2017 1414   ALT 24 05/07/2017 1221   ALKPHOS 132 07/30/2017 1414   ALKPHOS 131 05/07/2017 1221   BILITOT 0.3 07/30/2017 1414   BILITOT 0.48 05/07/2017 1221   GFRNONAA >60 07/30/2017 1414   GFRAA >60 07/30/2017 1414    No results found for: TOTALPROTELP,  ALBUMINELP, A1GS, A2GS, BETS, BETA2SER, GAMS, MSPIKE, SPEI  No results found for: Nils Pyle, [facility]  Lab Results  Component Value Date   WBC 7.6 07/30/2017   NEUTROABS 5.9 07/30/2017   HGB 12.7 07/30/2017   HCT 39.0 07/30/2017   MCV 78.0 (L) 07/30/2017   PLT 260 07/30/2017      Chemistry      Component Value Date/Time   NA 140 07/30/2017 1414   NA 140 05/07/2017 1221   K 3.8 07/30/2017 1414   K 4.0 05/07/2017 1221   CL 105 07/30/2017 1414   CO2 25 07/30/2017 1414   CO2 24 05/07/2017 1221   BUN 9 07/30/2017 1414   BUN 6.7 (L) 05/07/2017 1221   CREATININE 0.80 07/30/2017 1414   CREATININE 0.8 05/07/2017 1221      Component Value Date/Time   CALCIUM 9.7 07/30/2017 1414   CALCIUM 9.7 05/07/2017 1221   ALKPHOS 132 07/30/2017 1414   ALKPHOS 131 05/07/2017 1221   AST 16 07/30/2017 1414   AST 21 05/07/2017 1221   ALT 23 07/30/2017 1414   ALT 24 05/07/2017 1221   BILITOT 0.3 07/30/2017 1414   BILITOT 0.48 05/07/2017 1221       No results found for: LABCA2  No components found for: [id number]  No results for input(s): INR in the last 168 hours.  Urinalysis    Component Value Date/Time   COLORURINE YELLOW 06/12/2017 [place] 06/12/2017 1345   LABSPEC 1.025 06/12/2017 1345   LABSPEC 1.025 03/25/2017 1458   PHURINE 5.0 06/12/2017 1345   GLUCOSEU NEGATIVE 06/12/2017 1345   HGBUR [place] (A) 06/12/2017 1345   BILIRUBINUR NEGATIVE 06/12/2017 1345   BILIRUBINUR negative 03/25/2017 1458   BILIRUBINUR n  09/29/2016 [place] 06/12/2017 1345   PROTEINUR 30 (A) 06/12/2017 1345   UROBILINOGEN negative 09/29/2016 1045   NITRITE NEGATIVE 06/12/2017 1345   LEUKOCYTESUR TRACE (A) 06/12/2017 1345     STUDIES: Since her last visit, she completed an transabdominal and transvaginal ultrasound of the pelvis on 06/02/2017 showing: Continued presence of 12.9 cm multi-septated predominantly cystic mass in the right adnexal region  which contains probable peripheral solid component. This is concerning for cystic ovarian neoplasm or possibly malignancy.   The cystic mass on the left ovary and fallopian tube was biopsied on 06/16/2017 ([id number]) with results showing: benign serous cystadenofibroma . Paratubal cyst. Benign fallopian tube. Biopsy of the right fallopian tube, uterus and cervix showed: proliferative endometrium, no hyperplasia or malignancy. Myometrium: Leiomyomata. Adenomyosis and no malignancy. Unremarkable serosa. Benign squamous and endocervical mucosa with no dysplasia or malignancy. The right ovary and fallopian tube are unremarkable with no malignancy.    ELIGIBLE FOR AVAILABLE RESEARCH PROTOCOL: not a candidate for PREVENT  ASSESSMENT: 42 y.o.  [place] woman status post right breast upper inner quadrant biopsy 10/16/2016 for a clinical T1b pN0, stage IB invasive ductal carcinoma, grade 3, triple negative, with an MIB-1 of 80%.  (1)  genetics testing 12/01/2016 showed several variants of uncertain significance but no deleterious mutation  (2) neoadjuvant chemotherapy consisting of doxorubicin and cyclophosphamide in dose dense fashion 4 starting 11/05/2016, received third cycle 12/03/2016 then proceeded to weekly Abraxane starting 01/01/2017  (a) cycle 4 of cyclophosphamide and doxorubicin given at end of Abraxane on 02/12/2017  (b) Abraxane given from 01/01/17-01/22/17 (4 cycles), stopped early due to peripheral neuropathy  (3) status post right lumpectomy and sentinel lymph node sampling 03/18/2017 for a ypT1b ypN0 invasive ductal carcinoma, grade 3, with negative margins, and repeat prognostic panel again triple negative  (4) adjuvant radiation completed 05/20/2017 Site/dose:  The right breast was treated to 40.05 Gy in 15 fractions, followed by a 10 Gy boost in 5 fractions to yield a total dose of 50.05 Gy  (5) extensive right upper extremity deep venous thrombosis documented 11/23/2016, treated  initially with Lovenox  (a) transitioned to rivaroxaban as of 11/30/2016  (b) total 6 months anticoagulation planned (one month beyond port removal)  (6) tobacco abuse: The patient quit smoking 11/26/2016  (7) menometrorrhagia, with a cystic right adnexal lesion and a possible cervical polyp noted on ultrasound 12/16/2016, with benign endocervical curettage and endometrial biopsy 12/16/2016  (a) CA 125 12/17/2016 was 14.2 (normal).  (b) adnexal mass, likely benign, will be explored at the completion of chemotherapy  (c) goserelin started 12/16/2016, continued every 28 days, last dose 06/05/2017  (d) status post hysterectomy and bilateral salpingo-oophorectomy 06/16/2017  (8) iron deficiency anemia secondary to menometrorrhagia, status post Feraheme 05/20/2017 and 06/01/2017  (9) likely thalassemia trait    PLAN: Aidee is now approximately 6 months from her breast surgery with no evidence of disease activity.  This is favorable.  She has some questions regarding the cosmesis on the right breast.  I think it will continue to improve but it is already quite good at least in my opinion.  She of course no longer needs the shots to block her periods.  She has no anemia.  She still has [place] red cells.  I suspect she has a thalassemia trait.  We will try to document that.  I am setting her up for Doppler ultrasonography of her right upper extremity.  I am also setting her up for a d-dimer  next week.  If the clot has resolved and the d-dimer is favorable we will stop the rivaroxaban  Also I wrote her a prescription for a right upper extremity compression sleeve and glove.  She is having worse problems with hot flashes now.  I am starting her on venlafaxine 75 mg daily.  She will let me know if that does not work and I will double the dose.  She will have her next mammography at the breast center in April of this year.  I will see her again in May.  There has been a question whether her job  has received the appropriate documents.  We refax them today and we gave her a copy so she can take it personally so there should be no question of there receiving it.  She knows to call for any other issues that may develop before then   Mckenna Gamm, Virgie Dad, MD  08/06/17 2:45 PM Medical Oncology and Hematology [facility] [place], [place] [address] Tel. [phone]    Fax. [phone]  This document serves as a record of services personally performed by Chauncey Cruel, MD. It was created on her behalf by Margit Banda, a trained medical scribe. The creation of this record is based on the scribe's personal observations and the provider's statements to them. This document has been checked and approved by the attending provider.  I have reviewed the above documentation for accuracy and completeness, and I agree with the above.

## 2017-08-06 ENCOUNTER — Inpatient Hospital Stay (HOSPITAL_BASED_OUTPATIENT_CLINIC_OR_DEPARTMENT_OTHER): Payer: Managed Care, Other (non HMO) | Admitting: Oncology

## 2017-08-06 ENCOUNTER — Telehealth: Payer: Self-pay | Admitting: Oncology

## 2017-08-06 VITALS — BP 97/67 | HR 100 | Temp 98.9°F | Resp 20 | Ht 74.0 in | Wt 321.7 lb

## 2017-08-06 DIAGNOSIS — D509 Iron deficiency anemia, unspecified: Secondary | ICD-10-CM | POA: Insufficient documentation

## 2017-08-06 DIAGNOSIS — Z171 Estrogen receptor negative status [ER-]: Secondary | ICD-10-CM

## 2017-08-06 DIAGNOSIS — N83201 Unspecified ovarian cyst, right side: Secondary | ICD-10-CM | POA: Diagnosis not present

## 2017-08-06 DIAGNOSIS — Z90710 Acquired absence of both cervix and uterus: Secondary | ICD-10-CM

## 2017-08-06 DIAGNOSIS — D508 Other iron deficiency anemias: Secondary | ICD-10-CM

## 2017-08-06 DIAGNOSIS — C50211 Malignant neoplasm of upper-inner quadrant of right female breast: Secondary | ICD-10-CM

## 2017-08-06 DIAGNOSIS — R61 Generalized hyperhidrosis: Secondary | ICD-10-CM

## 2017-08-06 DIAGNOSIS — G629 Polyneuropathy, unspecified: Secondary | ICD-10-CM | POA: Diagnosis not present

## 2017-08-06 DIAGNOSIS — N951 Menopausal and female climacteric states: Secondary | ICD-10-CM

## 2017-08-06 MED ORDER — VENLAFAXINE HCL ER 75 MG PO CP24: 75.0000 mg | ORAL_CAPSULE | Freq: Every day | ORAL | 4 refills | Status: DC

## 2017-08-06 NOTE — Telephone Encounter (Signed)
Scheduled appts per 1/24 los - patient did not want avs or calendar.

## 2017-08-10 ENCOUNTER — Telehealth: Payer: Self-pay | Admitting: *Deleted

## 2017-08-10 NOTE — Telephone Encounter (Signed)
Faxed the last office note to [employer] per request.

## 2017-08-11 ENCOUNTER — Inpatient Hospital Stay: Payer: Managed Care, Other (non HMO)

## 2017-08-11 DIAGNOSIS — D508 Other iron deficiency anemias: Secondary | ICD-10-CM

## 2017-08-11 DIAGNOSIS — Z171 Estrogen receptor negative status [ER-]: Secondary | ICD-10-CM

## 2017-08-11 DIAGNOSIS — C50211 Malignant neoplasm of upper-inner quadrant of right female breast: Secondary | ICD-10-CM

## 2017-08-11 LAB — COMPREHENSIVE METABOLIC PANEL
ALT: 20 U/L (ref 0–55)
AST: 16 U/L (ref 5–34)
Albumin: 3.5 g/dL (ref 3.5–5.0)
Alkaline Phosphatase: 109 U/L (ref 40–150)
Anion gap: 9 (ref 3–11)
BUN: 10 mg/dL (ref 7–26)
CO2: 26 mmol/L (ref 22–29)
Calcium: 9.6 mg/dL (ref 8.4–10.4)
Chloride: 104 mmol/L (ref 98–109)
Creatinine, Ser: 0.83 mg/dL (ref 0.60–1.10)
GFR calc Af Amer: >60 mL/min (ref >60)
GFR calc non Af Amer: >60 mL/min (ref >60)
Glucose, Bld: 128 mg/dL (ref 70–140)
Potassium: 4.4 mmol/L (ref 3.3–4.7)
Sodium: 139 mmol/L (ref 136–145)
Total Bilirubin: 0.4 mg/dL (ref 0.2–1.2)
Total Protein: 7 g/dL (ref 6.4–8.3)

## 2017-08-11 LAB — CBC WITH DIFFERENTIAL/PLATELET
Basophils Absolute: 0 10*3/uL (ref 0.0–0.1)
Basophils Relative: 1 %
Eosinophils Absolute: 0.1 10*3/uL (ref 0.0–0.5)
Eosinophils Relative: 2 %
HCT: 39.6 % (ref 34.8–46.6)
Hemoglobin: 12.9 g/dL (ref 11.6–15.9)
Lymphocytes Relative: 20 %
Lymphs Abs: 1.2 10*3/uL (ref 0.9–3.3)
MCH: 26 pg (ref 25.1–34.0)
MCHC: 32.7 g/dL (ref 31.5–36.0)
MCV: 79.5 fL (ref 79.5–101.0)
Monocytes Absolute: 0.3 10*3/uL (ref 0.1–0.9)
Monocytes Relative: 4 %
Neutro Abs: 4.5 10*3/uL (ref 1.5–6.5)
Neutrophils Relative %: 73 %
Platelets: 251 10*3/uL (ref 145–400)
RBC: 4.98 MIL/uL (ref 3.70–5.45)
RDW: 19.6 % — ABNORMAL HIGH (ref 11.2–16.1)
WBC: 6.2 10*3/uL (ref 3.9–10.3)

## 2017-08-11 LAB — D-DIMER, QUANTITATIVE: D-Dimer, Quant: <0.27 ug/mL-FEU (ref 0.00–0.50)

## 2017-08-12 LAB — FERRITIN: Ferritin: 188 ng/mL (ref 9–269)

## 2017-08-18 ENCOUNTER — Other Ambulatory Visit: Payer: Self-pay

## 2017-08-18 MED ORDER — RIVAROXABAN 20 MG PO TABS: ORAL_TABLET | ORAL | 3 refills | Status: DC

## 2017-08-18 NOTE — Telephone Encounter (Signed)
Mailed patients application back from [facility] after obtaining MD signature and filled out high lighted areas. Application was received in this office today. Sent script with application as well. Placed in provided envelope and sent in outgoing mail this afternoon on 08/18/17.  Cyndia Bent RN

## 2017-08-21 ENCOUNTER — Telehealth: Payer: Self-pay | Admitting: Oncology

## 2017-08-21 NOTE — Telephone Encounter (Signed)
08/21/17 @ 2:12 spoke with patient to confirm Disability paperwork was successfully faxed to [facility] on 08/14/17 @ 9:15 am to [phone].  Patient requested a copy to be sent via mail for personal records.

## 2017-09-16 ENCOUNTER — Encounter: Payer: Managed Care, Other (non HMO) | Admitting: Family Medicine

## 2017-09-17 ENCOUNTER — Emergency Department (HOSPITAL_COMMUNITY)
Admission: EM | Admit: 2017-09-17 | Discharge: 2017-09-18 | Discharge status: Absent without leave | Payer: Managed Care, Other (non HMO)

## 2017-09-18 ENCOUNTER — Inpatient Hospital Stay: Payer: Managed Care, Other (non HMO) | Attending: Oncology | Admitting: Gynecologic Oncology

## 2017-09-18 ENCOUNTER — Telehealth: Payer: Self-pay | Admitting: Gynecologic Oncology

## 2017-09-18 ENCOUNTER — Encounter: Payer: Self-pay | Admitting: Gynecologic Oncology

## 2017-09-18 VITALS — BP 132/92 | HR 90 | Temp 98.5°F | Resp 20 | Ht 74.0 in | Wt 317.4 lb

## 2017-09-18 DIAGNOSIS — N939 Abnormal uterine and vaginal bleeding, unspecified: Secondary | ICD-10-CM | POA: Diagnosis not present

## 2017-09-18 DIAGNOSIS — Z90722 Acquired absence of ovaries, bilateral: Secondary | ICD-10-CM | POA: Diagnosis not present

## 2017-09-18 DIAGNOSIS — Z90710 Acquired absence of both cervix and uterus: Secondary | ICD-10-CM | POA: Insufficient documentation

## 2017-09-18 DIAGNOSIS — T8131XA Disruption of external operation (surgical) wound, not elsewhere classified, initial encounter: Secondary | ICD-10-CM | POA: Diagnosis not present

## 2017-09-18 MED ORDER — OXYCODONE-ACETAMINOPHEN 5-325 MG PO TABS: 1.0000 | ORAL_TABLET | ORAL | 0 refills | Status: DC | PRN

## 2017-09-18 MED ORDER — ESTROGENS, CONJUGATED 0.625 MG/GM VA CREA: 1.0000 | TOPICAL_CREAM | Freq: Every day | VAGINAL | 12 refills | Status: AC

## 2017-09-18 NOTE — Progress Notes (Signed)
Gyn Onc Follow-up  HPI:  Kelly Mendez is a 42 y.o. year old with a history of breast cancer initially seen in consultation in June, 2018 for a right ovarian mass and abnormal uterine bleeding.  She then underwent a robotic hysterectomy and BSO on 37/6/28 without complications.  Her postoperative course was uncomplicated.  Her final pathology revealed bilateral cystadenomas (benign) and adenomyosis and fibroids of the uterus.  She was evaluated postop at week four and was well healed from surgery.  Interval Hx/  Had intercourse at approximately 10 PM last night and developed sudden pelvic pain and vaginal bleeding.  She went to the ER to be evaluated however due to the long wait time she left.  The bleeding became the pain has somewhat improved.  Subsequently lighter over the course of 24 hours and is now just spotting.  She has had sex multiple times in the last month since surgery but did not have sex in the first 8 weeks. She is not on HRT due to HR positive breast cancer.   Review of systems: Constitutional:  She has no weight gain or weight loss. She has no fever or chills. Eyes: No blurred vision Ears, Nose, Mouth, Throat: No dizziness, headaches or changes in hearing. No mouth sores. Cardiovascular: No chest pain, palpitations or edema. Respiratory:  No shortness of breath, wheezing or cough Gastrointestinal: She has normal bowel movements without diarrhea or constipation. She denies any nausea or vomiting. She denies blood in her stool or heart burn. Genitourinary:  She denies pelvic pain, pelvic pressure or changes in her urinary function. She has no hematuria, dysuria, or incontinence. She has no irregular vaginal bleeding or vaginal discharge Musculoskeletal: Denies muscle weakness or joint pains.  Skin:  She has no skin changes, rashes or itching Neurological:  Denies dizziness or headaches. No neuropathy, no numbness or tingling. Psychiatric:  She denies depression or  anxiety. Hematologic/Lymphatic:   No easy bruising or bleeding   Physical Exam: Blood pressure (!) 132/92, pulse 90, temperature 98.5 F (36.9 C), resp. rate 20, height 6\' 2"  (1.88 m), weight (!) 317 lb 6.4 oz (144 kg), last menstrual period 01/10/2017, SpO2 99 %. General: Well dressed, well nourished in no apparent distress.   HEENT:  Normocephalic and atraumatic, no lesions.  Extraocular muscles intact. Sclerae anicteric. Pupils equal, round, reactive. No mouth sores or ulcers. Thyroid is normal size, not nodular, midline. Abdomen:  Soft, nontender, nondistended.  No palpable masses.  No hepatosplenomegaly.  No ascites. Normal bowel sounds.  No hernias.  Incisions are well healed Genitourinary: Normal EGBUS  Vaginal cuff intact.  No bleeding or discharge.  No cul de sac fullness. Extremities: No cyanosis, clubbing or edema.  No calf tenderness or erythema. No palpable cords. Psychiatric: Mood and affect are appropriate. Neurological: Awake, alert and oriented x 3. Sensation is intact, no neuropathy.  Musculoskeletal: No pain, normal strength and range of motion.  Assessment:    42 y.o. year old with a history of adenomyosis and fibroids and bilateral benign ovarian cysts in the setting of breast cancer.   S/p robotic hysterectomy, BSO on 06/16/17.  Now with vaginal cuff mucosal separation. Does not require surgical repair.   Plan: Premarin cream x 1 month then re-evaluation. The patient has a history of breast cancer however, premarin has minimal systemic absorption and we are prescribing a limited duration of therapy. Recommend abstinence from intercourse until after re-evaluation.    Thereasa Solo, MD

## 2017-09-18 NOTE — Patient Instructions (Signed)
Plan on using vaginal premarin every night for one month.  Plan on following up in one month or sooner if needed.  No intercourse or nothing in the vagina until seen in one month.  Please call for any needs.  Monitor your bowels while taking the pain medication and use can use stool softeners, miralax, or senna-kot as needed for constipation.

## 2017-09-18 NOTE — Telephone Encounter (Signed)
Patient called reporting vaginal bleeding like a light period that began last pm around 10 or 11.  She also reports difficulty emptying her bladder and states she has to bear down hard to get urine out.  She reports her pain at an 8 before taking the left over oxycodone from her previous surgery.  Situation discussed with Dr. Denman George.  Plan for patient to come in today to be evaluated. Patient verbalizing understanding.  Stable enough to come in.

## 2017-09-27 NOTE — Progress Notes (Signed)
Subjective:    Patient ID: Kelly Mendez, female    DOB: 1975/10/12, 42 y.o.   MRN: [id number]  HPI Chief Complaint  Patient presents with  . cpe    cpe. no pap   She is here for a complete physical exam.  History of breast cancer. Has an upcoming mammogram and is anxious about this since last year was her breast cancer diagnosis.   Recent visit to the ED for vaginal bleeding. States she saw Dr. Denman George, her surgeon the next day and was told her cervical cuff (post hysterectomy) had ruptured during intercourse. She has occasional spotting but this appears to be healing per patient.   She is checking her FBS 3-4 times per week and no lows. Her highest FBS was in the 130s.  Taking Metformin twice daily and has been out of Trulicity for the past 6 weeks. She did not let me know.  No new concerns.   She is on Effexor for hot flashes.   Social history: Lives with husband and children, works for [employer] and goes back on April 7th.  Former smoker,  Occasional alcohol use, denies drug use  Diet: unhealthy and aware that she has gained weight.  Excerise: nothing lately   Immunizations: Tdap up to date   Health maintenance:  Mammogram: March 27th  Colonoscopy: never  Last Menstrual cycle: hysterectomy  Last Dental Exam: overdue  Last Eye Exam: overdue   Wears seatbelt always, smoke detectors in home and functioning, does not text while driving and feels safe in home environment.   Reviewed allergies, medications, past medical, surgical, family, and social history.    Review of Systems Review of Systems Constitutional: -fever, -chills, -sweats, -unexpected weight change,-fatigue ENT: -runny nose, -ear pain, -sore throat Cardiology:  -chest pain, -palpitations, -edema Respiratory: -cough, -shortness of breath, -wheezing Gastroenterology: -abdominal pain, -nausea, -vomiting, -diarrhea, -constipation  Hematology: -bleeding or bruising problems Musculoskeletal: -arthralgias,  -myalgias, -joint swelling, -back pain Ophthalmology: -vision changes Urology: -dysuria, -difficulty urinating, -hematuria, -urinary frequency, -urgency Neurology: -headache, -weakness, -tingling, -numbness       Objective:   Physical Exam BP 120/80   Pulse 90   Ht 6\' 1"  (1.854 m)   Wt (!) 323 lb 12.8 oz (146.9 kg)   LMP 01/10/2017   BMI 42.72 kg/m   General Appearance:    Alert, cooperative, no distress, appears stated age  Head:    Normocephalic, without obvious abnormality, atraumatic  Eyes:    PERRL, conjunctiva/corneas clear, EOM's intact, fundi    benign  Ears:    Normal TM's and external ear canals  Nose:   Nares normal, mucosa normal, no drainage or sinus   tenderness  Throat:   Lips, mucosa, and tongue normal; teeth and gums normal  Neck:   Supple, no lymphadenopathy;  thyroid:  no   enlargement/tenderness/nodules; no carotid   bruit or JVD  Back:    Spine nontender, no curvature, ROM normal, no CVA     tenderness  Lungs:     Clear to auscultation bilaterally without wheezes, rales or     ronchi; respirations unlabored  Chest Wall:    No tenderness or deformity   Heart:    Regular rate and rhythm, S1 and S2 normal, no murmur, rub   or gallop  Breast Exam:    OB/GYN   Abdomen:     Soft, non-tender, nondistended, normoactive bowel sounds,    no masses, no hepatosplenomegaly  Genitalia:    OB/GYN  Extremities:   No clubbing, cyanosis or edema  Pulses:   2+ and symmetric all extremities  Skin:   Skin color, texture, turgor normal, no rashes or lesions  Lymph nodes:   Cervical, supraclavicular, and axillary nodes normal  Neurologic:   CNII-XII intact, normal strength, sensation and gait; reflexes 2+ and symmetric throughout          Psych:   Normal mood, affect, hygiene and grooming.    Urinalysis dipstick: blood 1+, trace protein. (sent for microscopy)  Possible spotting post injury to cervical cuff.       Assessment & Plan:  Routine general medical  examination at a health care facility - Plan: POCT Urinalysis DIP (Proadvantage Device), CBC with Differential/Platelet, Comprehensive metabolic panel, TSH, Lipid panel  Controlled type 2 diabetes mellitus with complication, without long-term current use of insulin (HCC) - Plan: HgB A1c, Microalbumin / creatinine urine ratio, TSH, Lipid panel, lisinopril (ZESTRIL) 2.5 MG tablet  Hematuria, unspecified type - Plan: Urinalysis, microscopic only  Morbid obesity (HCC) - Plan: Lipid panel  Vaccine counseling - Plan: Pneumococcal conjugate vaccine 13-valent  Hemoglobin A1c is 6.4% and her diabetes is well controlled.  She will hold off on Trulicity since insurance is not covering this currently. Continue on Metformin bid. Start her on lisinopril for renal protection. Discussed potential side effects such as cough and more serious side effect of angioedema and when to seek immediate medical attention. Continue on statin.  Counseling done on healthy diet and exercise for diabetes and weight loss.  She is aware that her BMI places her in the severely obese category.  Diabetes eye exam- overdue. Encouraged her to get this.  Foot exam done and good sensation and motor function. 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  Hematuria- presumed to be related to recent injury to cervical cuff and vaginal bleeding. Will recheck.  Immunization counseling- Prevnar 13 given.  Mammogram is scheduled.  Follow up pending labs or in 4 months for diabetes. Consider when to get baseline DEXA since hysterectomy last year.

## 2017-09-28 ENCOUNTER — Ambulatory Visit (INDEPENDENT_AMBULATORY_CARE_PROVIDER_SITE_OTHER): Payer: Managed Care, Other (non HMO) | Admitting: Family Medicine

## 2017-09-28 ENCOUNTER — Encounter: Payer: Self-pay | Admitting: Family Medicine

## 2017-09-28 VITALS — BP 120/80 | HR 90 | Ht 73.0 in | Wt 323.8 lb

## 2017-09-28 DIAGNOSIS — Z0000 Encounter for general adult medical examination without abnormal findings: Secondary | ICD-10-CM

## 2017-09-28 DIAGNOSIS — E118 Type 2 diabetes mellitus with unspecified complications: Secondary | ICD-10-CM

## 2017-09-28 DIAGNOSIS — Z23 Encounter for immunization: Secondary | ICD-10-CM

## 2017-09-28 DIAGNOSIS — Z7185 Encounter for immunization safety counseling: Secondary | ICD-10-CM

## 2017-09-28 DIAGNOSIS — Z7189 Other specified counseling: Secondary | ICD-10-CM

## 2017-09-28 DIAGNOSIS — R319 Hematuria, unspecified: Secondary | ICD-10-CM

## 2017-09-28 LAB — POCT URINALYSIS DIP (PROADVANTAGE DEVICE)
Bilirubin, UA: NEGATIVE
Glucose, UA: NEGATIVE mg/dL
Ketones, POC UA: NEGATIVE mg/dL
Leukocytes, UA: NEGATIVE
Nitrite, UA: NEGATIVE
Specific Gravity, Urine: 1.03
Urobilinogen, Ur: NEGATIVE
pH, UA: 6 (ref 5.0–8.0)

## 2017-09-28 LAB — POCT GLYCOSYLATED HEMOGLOBIN (HGB A1C): Hemoglobin A1C: 6.4

## 2017-09-28 MED ORDER — LISINOPRIL 2.5 MG PO TABS: 2.5000 mg | ORAL_TABLET | Freq: Every day | ORAL | 2 refills | Status: DC

## 2017-09-28 NOTE — Patient Instructions (Signed)
Your hemoglobin A1c is 6.4% and your diabetes is well controlled. Make sure you are cutting back on carbohydrates, sugary foods and drinks, portion sizes and increase your physical activity. Please call and schedule a diabetic eye exam.  I am starting you on a medication for blood pressure called lisinopril.  I am starting you on this due to your diabetes.  If you have any concerns or issues with this medication, please call or return.  We will call you with your lab results and your urine results.  Follow-up in 4 months for diabetes.   Preventative Care for Adults - Female      MAINTAIN REGULAR HEALTH EXAMS:  A routine yearly physical is a good way to check in with your primary care provider about your health and preventive screening. It is also an opportunity to share updates about your health and any concerns you have, and receive a thorough all-over exam.   Most health insurance companies pay for at least some preventative services.  Check with your health plan for specific coverages.  WHAT PREVENTATIVE SERVICES DO WOMEN NEED?  Adult women should have their weight and blood pressure checked regularly.   Women age 10 and older should have their cholesterol levels checked regularly.  Women should be screened for cervical cancer with a Pap smear and pelvic exam beginning at age 43.  Breast cancer screening generally begins at age 46 with a mammogram and breast exam by your primary care provider.    Beginning at age 43 and continuing to age 42, women should be screened for colorectal cancer.  Certain people may need continued testing until age 54.  Updating vaccinations is part of preventative care.  Vaccinations help protect against diseases such as the flu.  Osteoporosis is a disease in which the bones lose minerals and strength as we age. Women ages 10 and over should discuss this with their caregivers, as should women after menopause who have other risk factors.  Lab tests are  generally done as part of preventative care to screen for anemia and blood disorders, to screen for problems with the kidneys and liver, to screen for bladder problems, to check blood sugar, and to check your cholesterol level.  Preventative services generally include counseling about diet, exercise, avoiding tobacco, drugs, excessive alcohol consumption, and sexually transmitted infections.    GENERAL RECOMMENDATIONS FOR GOOD HEALTH:  Healthy diet:  Eat a variety of foods, including fruit, vegetables, animal or vegetable protein, such as meat, fish, chicken, and eggs, or beans, lentils, tofu, and grains, such as rice.  Drink plenty of water daily.  Decrease saturated fat in the diet, avoid lots of red meat, processed foods, sweets, fast foods, and fried foods.  Exercise:  Aerobic exercise helps maintain good heart health. At least 30-40 minutes of moderate-intensity exercise is recommended. For example, a brisk walk that increases your heart rate and breathing. This should be done on most days of the week.   Find a type of exercise or a variety of exercises that you enjoy so that it becomes a part of your daily life.  Examples are running, walking, swimming, water aerobics, and biking.  For motivation and support, explore group exercise such as aerobic class, spin class, Zumba, Yoga,or  martial arts, etc.    Set exercise goals for yourself, such as a certain weight goal, walk or run in a race such as a 5k walk/run.  Speak to your primary care provider about exercise goals.  Disease prevention:  If you smoke or chew tobacco, find out from your caregiver how to quit. It can literally save your life, no matter how long you have been a tobacco user. If you do not use tobacco, never begin.   Maintain a healthy diet and normal weight. Increased weight leads to problems with blood pressure and diabetes.   The Body Mass Index or BMI is a way of measuring how much of your body is fat. Having a  BMI above 27 increases the risk of heart disease, diabetes, hypertension, stroke and other problems related to obesity. Your caregiver can help determine your BMI and based on it develop an exercise and dietary program to help you achieve or maintain this important measurement at a healthful level.  High blood pressure causes heart and blood vessel problems.  Persistent high blood pressure should be treated with medicine if weight loss and exercise do not work.   Fat and cholesterol leaves deposits in your arteries that can block them. This causes heart disease and vessel disease elsewhere in your body.  If your cholesterol is found to be high, or if you have heart disease or certain other medical conditions, then you may need to have your cholesterol monitored frequently and be treated with medication.   Ask if you should have a cardiac stress test if your history suggests this. A stress test is a test done on a treadmill that looks for heart disease. This test can find disease prior to there being a problem.  Menopause can be associated with physical symptoms and risks. Hormone replacement therapy is available to decrease these. You should talk to your caregiver about whether starting or continuing to take hormones is right for you.   Osteoporosis is a disease in which the bones lose minerals and strength as we age. This can result in serious bone fractures. Risk of osteoporosis can be identified using a bone density scan. Women ages 108 and over should discuss this with their caregivers, as should women after menopause who have other risk factors. Ask your caregiver whether you should be taking a calcium supplement and Vitamin D, to reduce the rate of osteoporosis.   Avoid drinking alcohol in excess (more than two drinks per day).  Avoid use of street drugs. Do not share needles with anyone. Ask for professional help if you need assistance or instructions on stopping the use of alcohol, cigarettes,  and/or drugs.  Brush your teeth twice a day with fluoride toothpaste, and floss once a day. Good oral hygiene prevents tooth decay and gum disease. The problems can be painful, unattractive, and can cause other health problems. Visit your dentist for a routine oral and dental check up and preventive care every 6-12 months.   Look at your skin regularly.  Use a mirror to look at your back. Notify your caregivers of changes in moles, especially if there are changes in shapes, colors, a size larger than a pencil eraser, an irregular border, or development of new moles.  Safety:  Use seatbelts 100% of the time, whether driving or as a passenger.  Use safety devices such as hearing protection if you work in environments with loud noise or significant background noise.  Use safety glasses when doing any work that could send debris in to the eyes.  Use a helmet if you ride a bike or motorcycle.  Use appropriate safety gear for contact sports.  Talk to your caregiver about gun safety.  Use sunscreen with a SPF (or skin  protection factor) of 15 or greater.  Lighter skinned people are at a greater risk of skin cancer. Don't forget to also wear sunglasses in order to protect your eyes from too much damaging sunlight. Damaging sunlight can accelerate cataract formation.   Practice safe sex. Use condoms. Condoms are used for birth control and to help reduce the spread of sexually transmitted infections (or STIs).  Some of the STIs are gonorrhea (the clap), chlamydia, syphilis, trichomonas, herpes, HPV (human papilloma virus) and HIV (human immunodeficiency virus) which causes AIDS. The herpes, HIV and HPV are viral illnesses that have no cure. These can result in disability, cancer and death.   Keep carbon monoxide and smoke detectors in your home functioning at all times. Change the batteries every 6 months or use a model that plugs into the wall.   Vaccinations:  Stay up to date with your tetanus shots and  other required immunizations. You should have a booster for tetanus every 10 years. Be sure to get your flu shot every year, since 5%-20% of the [place] population comes down with the flu. The flu vaccine changes each year, so being vaccinated once is not enough. Get your shot in the fall, before the flu season peaks.   Other vaccines to consider:  Human Papilloma Virus or HPV causes cancer of the cervix, and other infections that can be transmitted from person to person. There is a vaccine for HPV, and females should get immunized between the ages of 77 and 102. It requires a series of 3 shots.   Pneumococcal vaccine to protect against certain types of pneumonia.  This is normally recommended for adults age 59 or older.  However, adults younger than 42 years old with certain underlying conditions such as diabetes, heart or lung disease should also receive the vaccine.  Shingles vaccine to protect against Varicella Zoster if you are older than age 69, or younger than 42 years old with certain underlying illness.  Hepatitis A vaccine to protect against a form of infection of the liver by a virus acquired from food.  Hepatitis B vaccine to protect against a form of infection of the liver by a virus acquired from blood or body fluids, particularly if you work in health care.  If you plan to travel internationally, check with your local health department for specific vaccination recommendations.  Cancer Screening:  Breast cancer screening is essential to preventive care for women. All women age 43 and older should perform a breast self-exam every month. At age 106 and older, women should have their caregiver complete a breast exam each year. Women at ages 67 and older should have a mammogram (x-ray film) of the breasts. Your caregiver can discuss how often you need mammograms.    Cervical cancer screening includes taking a Pap smear (sample of cells examined under a microscope) from the cervix (end of the  uterus). It also includes testing for HPV (Human Papilloma Virus, which can cause cervical cancer). Screening and a pelvic exam should begin at age 25, or 3 years after a woman becomes sexually active. Screening should occur every year, with a Pap smear but no HPV testing, up to age 19. After age 53, you should have a Pap smear every 3 years with HPV testing, if no HPV was found previously.   Most routine colon cancer screening begins at the age of 69. On a yearly basis, doctors may provide special easy to use take-home tests to check for hidden blood in the  stool. Sigmoidoscopy or colonoscopy can detect the earliest forms of colon cancer and is life saving. These tests use a Stitely camera at the end of a tube to directly examine the colon. Speak to your caregiver about this at age 78, when routine screening begins (and is repeated every 5 years unless early forms of pre-cancerous polyps or Marschner growths are found).

## 2017-09-29 ENCOUNTER — Encounter: Payer: Self-pay | Admitting: Family Medicine

## 2017-09-29 ENCOUNTER — Other Ambulatory Visit: Payer: Self-pay | Admitting: Family Medicine

## 2017-09-29 DIAGNOSIS — R3121 Asymptomatic microscopic hematuria: Secondary | ICD-10-CM

## 2017-09-29 HISTORY — DX: Asymptomatic microscopic hematuria: R31.21

## 2017-09-29 LAB — LIPID PANEL
Chol/HDL Ratio: 2.9 ratio (ref 0.0–4.4)
Cholesterol, Total: 99 mg/dL — ABNORMAL LOW (ref 100–199)
HDL: 34 mg/dL — ABNORMAL LOW (ref >39)
LDL Calculated: 54 mg/dL (ref 0–99)
Triglycerides: 57 mg/dL (ref 0–149)
VLDL Cholesterol Cal: 11 mg/dL (ref 5–40)

## 2017-09-29 LAB — COMPREHENSIVE METABOLIC PANEL
ALT: 26 IU/L (ref 0–32)
AST: 21 IU/L (ref 0–40)
Albumin/Globulin Ratio: 1.7 (ref 1.2–2.2)
Albumin: 4.3 g/dL (ref 3.5–5.5)
Alkaline Phosphatase: 127 IU/L — ABNORMAL HIGH (ref 39–117)
BUN/Creatinine Ratio: 18 (ref 9–23)
BUN: 13 mg/dL (ref 6–24)
Bilirubin Total: 0.2 mg/dL (ref 0.0–1.2)
CO2: 18 mmol/L — ABNORMAL LOW (ref 20–29)
Calcium: 9.6 mg/dL (ref 8.7–10.2)
Chloride: 105 mmol/L (ref 96–106)
Creatinine, Ser: 0.73 mg/dL (ref 0.57–1.00)
GFR calc Af Amer: 118 mL/min/{1.73_m2} (ref >59)
GFR calc non Af Amer: 103 mL/min/{1.73_m2} (ref >59)
Globulin, Total: 2.5 g/dL (ref 1.5–4.5)
Glucose: 156 mg/dL — ABNORMAL HIGH (ref 65–99)
Potassium: 4.6 mmol/L (ref 3.5–5.2)
Sodium: 143 mmol/L (ref 134–144)
Total Protein: 6.8 g/dL (ref 6.0–8.5)

## 2017-09-29 LAB — CBC WITH DIFFERENTIAL/PLATELET
Basophils Absolute: 0 10*3/uL (ref 0.0–0.2)
Basos: 0 %
EOS (ABSOLUTE): 0.2 10*3/uL (ref 0.0–0.4)
Eos: 3 %
Hematocrit: 38.1 % (ref 34.0–46.6)
Hemoglobin: 12.9 g/dL (ref 11.1–15.9)
Immature Grans (Abs): 0 10*3/uL (ref 0.0–0.1)
Immature Granulocytes: 0 %
Lymphocytes Absolute: 1.3 10*3/uL (ref 0.7–3.1)
Lymphs: 20 %
MCH: 27 pg (ref 26.6–33.0)
MCHC: 33.9 g/dL (ref 31.5–35.7)
MCV: 80 fL (ref 79–97)
Monocytes Absolute: 0.3 10*3/uL (ref 0.1–0.9)
Monocytes: 4 %
Neutrophils Absolute: 4.5 10*3/uL (ref 1.4–7.0)
Neutrophils: 73 %
Platelets: 235 10*3/uL (ref 150–379)
RBC: 4.78 x10E6/uL (ref 3.77–5.28)
RDW: 14.6 % (ref 12.3–15.4)
WBC: 6.2 10*3/uL (ref 3.4–10.8)

## 2017-09-29 LAB — URINALYSIS, MICROSCOPIC ONLY: Casts: NONE SEEN /lpf

## 2017-09-29 LAB — MICROALBUMIN / CREATININE URINE RATIO
Creatinine, Urine: 197.6 mg/dL
Microalb/Creat Ratio: 24.8 mg/g creat (ref 0.0–30.0)
Microalbumin, Urine: 49 ug/mL

## 2017-09-29 LAB — TSH: TSH: 3.85 u[IU]/mL (ref 0.450–4.500)

## 2017-10-01 ENCOUNTER — Encounter: Payer: Self-pay | Admitting: *Deleted

## 2017-10-07 ENCOUNTER — Other Ambulatory Visit: Payer: Self-pay | Admitting: Oncology

## 2017-10-07 ENCOUNTER — Ambulatory Visit
Admission: RE | Admit: 2017-10-07 | Discharge: 2017-10-07 | Disposition: A | Discharge status: Home / Self Care | Payer: Managed Care, Other (non HMO) | Source: Ambulatory Visit | Attending: Oncology | Admitting: Oncology

## 2017-10-07 DIAGNOSIS — N6489 Other specified disorders of breast: Secondary | ICD-10-CM

## 2017-10-07 DIAGNOSIS — Z171 Estrogen receptor negative status [ER-]: Secondary | ICD-10-CM

## 2017-10-07 DIAGNOSIS — R921 Mammographic calcification found on diagnostic imaging of breast: Secondary | ICD-10-CM

## 2017-10-07 DIAGNOSIS — C50211 Malignant neoplasm of upper-inner quadrant of right female breast: Secondary | ICD-10-CM

## 2017-10-07 DIAGNOSIS — D508 Other iron deficiency anemias: Secondary | ICD-10-CM

## 2017-10-07 HISTORY — DX: Personal history of antineoplastic chemotherapy: Z92.21

## 2017-10-07 HISTORY — DX: Personal history of irradiation: Z92.3

## 2017-10-21 ENCOUNTER — Ambulatory Visit (INDEPENDENT_AMBULATORY_CARE_PROVIDER_SITE_OTHER): Payer: Managed Care, Other (non HMO) | Admitting: Family Medicine

## 2017-10-21 ENCOUNTER — Inpatient Hospital Stay: Payer: Managed Care, Other (non HMO) | Attending: Oncology | Admitting: Gynecologic Oncology

## 2017-10-21 ENCOUNTER — Encounter: Payer: Self-pay | Admitting: Family Medicine

## 2017-10-21 ENCOUNTER — Encounter: Payer: Self-pay | Admitting: Gynecologic Oncology

## 2017-10-21 VITALS — BP 136/84 | HR 89 | Temp 98.7°F | Ht 73.0 in | Wt 326.2 lb

## 2017-10-21 VITALS — BP 127/69 | HR 96 | Resp 20 | Ht 73.5 in | Wt 326.0 lb

## 2017-10-21 DIAGNOSIS — D269 Other benign neoplasm of uterus, unspecified: Secondary | ICD-10-CM

## 2017-10-21 DIAGNOSIS — R81 Glycosuria: Secondary | ICD-10-CM | POA: Diagnosis not present

## 2017-10-21 DIAGNOSIS — R509 Fever, unspecified: Secondary | ICD-10-CM | POA: Insufficient documentation

## 2017-10-21 DIAGNOSIS — Z90710 Acquired absence of both cervix and uterus: Secondary | ICD-10-CM | POA: Insufficient documentation

## 2017-10-21 DIAGNOSIS — R300 Dysuria: Secondary | ICD-10-CM | POA: Insufficient documentation

## 2017-10-21 DIAGNOSIS — Z853 Personal history of malignant neoplasm of breast: Secondary | ICD-10-CM | POA: Diagnosis not present

## 2017-10-21 DIAGNOSIS — N800 Endometriosis of uterus: Secondary | ICD-10-CM | POA: Insufficient documentation

## 2017-10-21 DIAGNOSIS — T8131XA Disruption of external operation (surgical) wound, not elsewhere classified, initial encounter: Secondary | ICD-10-CM | POA: Insufficient documentation

## 2017-10-21 DIAGNOSIS — Z90722 Acquired absence of ovaries, bilateral: Secondary | ICD-10-CM | POA: Diagnosis not present

## 2017-10-21 DIAGNOSIS — R7309 Other abnormal glucose: Secondary | ICD-10-CM

## 2017-10-21 DIAGNOSIS — R3121 Asymptomatic microscopic hematuria: Secondary | ICD-10-CM | POA: Diagnosis not present

## 2017-10-21 DIAGNOSIS — Z87891 Personal history of nicotine dependence: Secondary | ICD-10-CM | POA: Insufficient documentation

## 2017-10-21 LAB — POCT URINALYSIS DIP (PROADVANTAGE DEVICE)
Blood, UA: NEGATIVE
Glucose, UA: 250 mg/dL — AB
Leukocytes, UA: NEGATIVE
Nitrite, UA: NEGATIVE
Specific Gravity, Urine: 1.03
Urobilinogen, Ur: 3.5
pH, UA: 6 (ref 5.0–8.0)

## 2017-10-21 LAB — GLUCOSE, POCT (MANUAL RESULT ENTRY): POC Glucose: 164 mg/dl — AB (ref 70–99)

## 2017-10-21 NOTE — Progress Notes (Signed)
Gyn Onc Follow-up  HPI:  Kelly Mendez is a 42 y.o. year old with a history of breast cancer initially seen in consultation in June, 2018 for a right ovarian mass and abnormal uterine bleeding.  She then underwent a robotic hysterectomy and BSO on 54/6/56 without complications.  Her postoperative course was uncomplicated.  Her final pathology revealed bilateral cystadenomas (benign) and adenomyosis and fibroids of the uterus.  She was evaluated postop at week four and was well healed from surgery.  Interval Hx/  Had intercourse at approximately 10 PM last night and developed sudden pelvic pain and vaginal bleeding.  She went to the ER to be evaluated however due to the long wait time she left.  The bleeding became the pain has somewhat improved.  Subsequently lighter over the course of 24 hours and is now just spotting.  She has had sex multiple times in the last month since surgery but did not have sex in the first 8 weeks. She is not on HRT due to HR positive breast cancer.   She has noticed no bleeding and decreased pain in the pelvis since her last visit. She did not fill the premarin cream Rx due to cost. However she has been on strict pelvic rest.   Review of systems: Constitutional:  She has no weight gain or weight loss. She has no fever or chills. Eyes: No blurred vision Ears, Nose, Mouth, Throat: No dizziness, headaches or changes in hearing. No mouth sores. Cardiovascular: No chest pain, palpitations or edema. Respiratory:  No shortness of breath, wheezing or cough Gastrointestinal: She has normal bowel movements without diarrhea or constipation. She denies any nausea or vomiting. She denies blood in her stool or heart burn. Genitourinary:  She denies pelvic pain, pelvic pressure or changes in her urinary function. She has no hematuria, dysuria, or incontinence. She has no irregular vaginal bleeding or vaginal discharge Musculoskeletal: Denies muscle weakness or joint pains.  Skin:   She has no skin changes, rashes or itching Neurological:  Denies dizziness or headaches. No neuropathy, no numbness or tingling. Psychiatric:  She denies depression or anxiety. Hematologic/Lymphatic:   No easy bruising or bleeding   Physical Exam: Blood pressure 127/69, pulse 96, resp. rate 20, height 6' 1.5" (1.867 m), weight (!) 326 lb (147.9 kg), last menstrual period 01/10/2017, SpO2 100 %. General: Well dressed, well nourished in no apparent distress.   HEENT:  Normocephalic and atraumatic, no lesions.  Extraocular muscles intact. Sclerae anicteric. Pupils equal, round, reactive. No mouth sores or ulcers. Thyroid is normal size, not nodular, midline. Abdomen:  Soft, nontender, nondistended.  No palpable masses.  No hepatosplenomegaly.  No ascites. Normal bowel sounds.  No hernias.  Incisions are well healed Genitourinary: Normal EGBUS  Vaginal cuff intact.  No bleeding or discharge.  No cul de sac fullness. Extremities: No cyanosis, clubbing or edema.  No calf tenderness or erythema. No palpable cords. Psychiatric: Mood and affect are appropriate. Neurological: Awake, alert and oriented x 3. Sensation is intact, no neuropathy.  Musculoskeletal: No pain, normal strength and range of motion.  Assessment:    42 y.o. year old with a history of adenomyosis and fibroids and bilateral benign ovarian cysts in the setting of breast cancer.   S/p robotic hysterectomy, BSO on 06/16/17.  History of vaginal cuff mucosal separation. Now closed spontaneously with pelvic rest.   Plan: Continue pelvic rest for 1 additional month. Follow-up on a prn basis  Thereasa Solo, MD

## 2017-10-21 NOTE — Patient Instructions (Signed)
Continue "pelvic rest" for 1 month - no sex, tampons or douching.  Please contact Dr Serita Grit office with any questions or concerns at [phone].

## 2017-10-21 NOTE — Progress Notes (Signed)
   Subjective:    Patient ID: Kelly Mendez, female    DOB: Jun 21, 1976, 42 y.o.   MRN: [id number]  HPI Chief Complaint  Patient presents with  . other    fllow up for blood in urine   She is here to follow up on asymptomatic hematuria. At her last visit she was recovering from a cervical cuff injury (post hysterectomy) and had occasional vaginal spotting. This was suspected to be the cause of her hematuria but we are repeating her UA today. Denies vaginal bleeding. States she is healing well. Has a follow up appointment with Dr. Denman George her surgeon later today.  Denies any new concerns or complaints today.   Denies fever, chills, abdominal pain, N/V/D, urinary symptoms.   Reviewed allergies, medications, past medical, surgical, family, and social history.   Review of Systems Pertinent positives and negatives in the history of present illness.     Objective:   Physical Exam BP 136/84 (BP Location: Left Arm, Patient Position: Sitting)   Pulse 89   Temp 98.7 F (37.1 C)   Ht 6\' 1"  (1.854 m)   Wt (!) 326 lb 3.2 oz (148 kg)   LMP 01/10/2017   SpO2 96%   BMI 43.04 kg/m   Alert and oriented and in no acute distress. Not otherwise examined.       Assessment & Plan:  Asymptomatic microscopic hematuria - Plan: POCT Urinalysis DIP (Proadvantage Device)  Elevated serum glucose with glucosuria - Plan: POCT glucose (manual entry)  Urinalysis dipstick: negative for blood, 3+glucose, trace ket, pro 1+ POCT random glucose 164 She last ate at 11 am and had pancakes with syrup.  Asymptomatic.  Discussed that hematuria appears to have been related to cervical cuff injury and vaginal bleeding and this has since resolved.  Follow up as scheduled for diabetes visit or sooner if needed.

## 2017-10-28 ENCOUNTER — Encounter: Payer: Self-pay | Admitting: Gynecologic Oncology

## 2017-10-29 ENCOUNTER — Encounter (HOSPITAL_COMMUNITY): Payer: Self-pay

## 2017-10-29 ENCOUNTER — Telehealth: Payer: Self-pay | Admitting: *Deleted

## 2017-10-29 ENCOUNTER — Ambulatory Visit (HOSPITAL_COMMUNITY)
Admission: RE | Admit: 2017-10-29 | Discharge: 2017-10-29 | Disposition: A | Discharge status: Home / Self Care | Payer: Managed Care, Other (non HMO) | Source: Ambulatory Visit | Attending: Obstetrics | Admitting: Obstetrics

## 2017-10-29 ENCOUNTER — Inpatient Hospital Stay (HOSPITAL_BASED_OUTPATIENT_CLINIC_OR_DEPARTMENT_OTHER): Payer: Managed Care, Other (non HMO) | Admitting: Obstetrics

## 2017-10-29 ENCOUNTER — Encounter: Payer: Self-pay | Admitting: Obstetrics

## 2017-10-29 ENCOUNTER — Inpatient Hospital Stay: Payer: Managed Care, Other (non HMO)

## 2017-10-29 VITALS — BP 117/85 | HR 113 | Temp 100.7°F | Resp 20 | Ht 73.0 in | Wt 319.0 lb

## 2017-10-29 DIAGNOSIS — R188 Other ascites: Secondary | ICD-10-CM | POA: Insufficient documentation

## 2017-10-29 DIAGNOSIS — D269 Other benign neoplasm of uterus, unspecified: Secondary | ICD-10-CM

## 2017-10-29 DIAGNOSIS — Z90710 Acquired absence of both cervix and uterus: Secondary | ICD-10-CM

## 2017-10-29 DIAGNOSIS — Z90722 Acquired absence of ovaries, bilateral: Secondary | ICD-10-CM

## 2017-10-29 DIAGNOSIS — R509 Fever, unspecified: Secondary | ICD-10-CM | POA: Insufficient documentation

## 2017-10-29 DIAGNOSIS — T8131XD Disruption of external operation (surgical) wound, not elsewhere classified, subsequent encounter: Secondary | ICD-10-CM

## 2017-10-29 DIAGNOSIS — T8131XA Disruption of external operation (surgical) wound, not elsewhere classified, initial encounter: Secondary | ICD-10-CM | POA: Diagnosis not present

## 2017-10-29 LAB — URINALYSIS, COMPLETE (UACMP) WITH MICROSCOPIC
Bacteria, UA: NONE SEEN
Bilirubin Urine: NEGATIVE
Glucose, UA: 50 mg/dL — AB
Ketones, ur: NEGATIVE mg/dL
Leukocytes, UA: NEGATIVE
Nitrite: NEGATIVE
Protein, ur: 30 mg/dL — AB
Specific Gravity, Urine: 1.019 (ref 1.005–1.030)
pH: 5 (ref 5.0–8.0)

## 2017-10-29 MED ORDER — IOPAMIDOL (ISOVUE-300) INJECTION 61%
30.0000 mL | Freq: Once | INTRAVENOUS | Status: AC | PRN
  Administered 2017-10-29: 30 mL via ORAL

## 2017-10-29 MED ORDER — IOPAMIDOL (ISOVUE-300) INJECTION 61%
INTRAVENOUS | Status: AC
  Filled 2017-10-29: qty 30

## 2017-10-29 MED ORDER — IOPAMIDOL (ISOVUE-300) INJECTION 61%
100.0000 mL | Freq: Once | INTRAVENOUS | Status: AC | PRN
  Administered 2017-10-29: 100 mL via INTRAVENOUS

## 2017-10-29 NOTE — Progress Notes (Signed)
Progress Note: Gyn-Onc  CC:  Chief Complaint  Patient presents with  . Vaginal cuff dehiscence, subsequent encounter  . Fever, unspecified fever cause    HPI: Kelly Mendez  is a very nice 42 y.o.    . Interval History:  Her last visit with Dr. Denman George 2 weeks ago she had to move a laundry basket significant pain in the pelvis.  She is also noting fevers.  She denies upper respiratory symptoms.  She does note burning on urination.  Eyes nausea and vomiting she denies any drainage or bleeding per vagina.  She does note when she had the pain on Monday with lifting of the laundry basket she had one Chiong spotting but none since.  . Initial Presentation: She has a history of breast cancer initially seen in consultation in June, 2018 for a right ovarian mass and abnormal uterine bleeding.  She then underwent a robotic hysterectomy and BSO on 78/9/38 without complications with Dr. Denman George.  Her postoperative course was uncomplicated.  Her final pathology revealed bilateral cystadenomas (benign) and adenomyosis and fibroids of the uterus.  She was evaluated postop at week four and was well healed from surgery.  09/18/17  developed sudden pelvic pain and vaginal bleeding.  She went to the ER to be evaluated however due to the long wait time she left.  The bleeding became the pain has somewhat improved.  Subsequently lighter over the course of 24 hours. She has had sex multiple times starting about 8 weeks postop.  She was to have started premarin PV postop but never purchased it due to cost.  She was found to have a vaginal cuff mucosal separation but no evisceration. She was managed conservatively with pelvic rest.   Oncologic History:      Malignant neoplasm of upper-inner quadrant of right breast in female, estrogen receptor negative ([place])   10/21/2016 Initial Diagnosis    Malignant neoplasm of upper-inner quadrant of right breast in female, estrogen receptor negative ([place])      12/12/2016  Genetic Testing    BLM c.2340G>A (silent), BRIP1 c.2594G>A (p.Arg865Gln) and MUTYH c.562A>G (p.Glu188Lys) VUS identified on the multi-gene panel.  The Multi-Gene Panel offered by [facility] includes sequencing and/or deletion duplication testing of the following 80 genes: ALK, APC, ATM, AXIN2,BAP1,  BARD1, BLM, BMPR1A, BRCA1, BRCA2, BRIP1, CASR, CDC73, CDH1, CDK4, CDKN1B, CDKN1C, CDKN2A (p14ARF), CDKN2A (p16INK4a), CEBPA, CHEK2, CTNNA1, DICER1, DIS3L2, EGFR (c.2369C>T, p.Thr790Met variant only), EPCAM (Deletion/duplication testing only), FH, FLCN, GATA2, GPC3, GREM1 (Promoter region deletion/duplication testing only), HOXB13 (c.251G>A, p.Gly84Glu), HRAS, KIT, MAX, MEN1, MET, MITF (c.952G>A, p.Glu318Lys variant only), MLH1, MSH2, MSH3, MSH6, MUTYH, NBN, NF1, NF2, NTHL1, PALB2, PDGFRA, PHOX2B, PMS2, POLD1, POLE, POT1, PRKAR1A, PTCH1, PTEN, RAD50, RAD51C, RAD51D, RB1, RECQL4, RET, RUNX1, SDHAF2, SDHA (sequence changes only), SDHB, SDHC, SDHD, SMAD4, SMARCA4, SMARCB1, SMARCE1, STK11, SUFU, TERT, TERT, TMEM127, TP53, TSC1, TSC2, VHL, WRN and WT1.  The report date is December 12, 2016.        The following portions of the patient's history were reviewed and updated as appropriate: allergies, current medications, past family history, past medical history, past social history, past surgical history and problem list.   Current Meds:  Outpatient Encounter Medications as of 10/29/2017  Medication Sig  . atorvastatin (LIPITOR) 20 MG tablet Take 1 tablet (20 mg total) by mouth daily.  [place] [facility] BIOTIN PO Take 1 capsule by mouth daily.  [place] Kitchen gabapentin (NEURONTIN) 300 MG capsule Take 1 capsule (300 mg total) by mouth at bedtime.  [place] Kitchen  Glucose Blood (BLOOD GLUCOSE TEST STRIPS) STRP Test twice a day. Pt uses one touch verio flex meter  . ibuprofen (ADVIL,MOTRIN) 200 MG tablet Take 400 mg by mouth daily as needed for headache or moderate pain.   [place] Kitchen lisinopril (ZESTRIL) 2.5 MG tablet Take 1 tablet (2.5 mg total) by mouth daily.  .  metFORMIN (GLUCOPHAGE) 1000 MG tablet TAKE 1 TABLET BY MOUTH TWICE DAILY WITH MEALS  . ONETOUCH DELICA LANCETS FINE MISC Test twice a day  . rivaroxaban (XARELTO) 20 MG TABS tablet TAKE 20 MG BY MOUTH ONCE DAILY WITH SUPPER  . TRULICITY 5.27 PO/2.4MP SOPN INJECT 1  SUBCUTANEOUSLY ONCE A WEEK  . venlafaxine XR (EFFEXOR-XR) 75 MG 24 hr capsule Take 1 capsule (75 mg total) by mouth daily with breakfast.   No facility-administered encounter medications on file as of 10/29/2017.     Allergy:  Allergies  Allergen Reactions  . Penicillins Other (See Comments)    As a child Has patient had a PCN reaction causing immediate rash, facial/tongue/throat swelling, SOB or lightheadedness with hypotension: unknown Has patient had a PCN reaction causing severe rash involving mucus membranes or skin necrosis: unknown Has patient had a PCN reaction that required hospitalization unknown Has patient had a PCN reaction occurring within the last 10 years: no If all of the above answers are "NO", then may proceed with Cephalosporin use.     Social Hx:   Social History   Socioeconomic History  . Marital status: Married    Spouse name: Not on file  . Number of children: Not on file  . Years of education: Not on file  . Highest education level: Not on file  Occupational History  . Not on file  Social Needs  . Financial resource strain: Not on file  . Food insecurity:    Worry: Not on file    Inability: Not on file  . Transportation needs:    Medical: Not on file    Non-medical: Not on file  Tobacco Use  . Smoking status: Former Smoker    Packs/day: 0.15    Years: 25.00    Pack years: 3.75    Types: Cigarettes    Last attempt to quit: 11/25/2016    Years since quitting: 0.9  . Smokeless tobacco: Never Used  Substance and Sexual Activity  . Alcohol use: Yes    Alcohol/week: 0.6 oz    Types: 1 Glasses of wine per week    Comment: rarely   . Drug use: No  . Sexual activity: Not Currently     Birth control/protection: Surgical    Comment: husband vasectomy  Lifestyle  . Physical activity:    Days per week: Not on file    Minutes per session: Not on file  . Stress: Not on file  Relationships  . Social connections:    Talks on phone: Not on file    Gets together: Not on file    Attends religious service: Not on file    Active member of club or organization: Not on file    Attends meetings of clubs or organizations: Not on file    Relationship status: Not on file  . Intimate partner violence:    Fear of current or ex partner: Not on file    Emotionally abused: Not on file    Physically abused: Not on file    Forced sexual activity: Not on file  Other Topics Concern  . Not on file  Social History Narrative  .  Not on file    Past Surgical Hx:  Past Surgical History:  Procedure Laterality Date  . BREAST LUMPECTOMY Right 03/18/2017  . BREAST LUMPECTOMY WITH RADIOACTIVE SEED AND SENTINEL LYMPH NODE BIOPSY Right 03/18/2017   Procedure: RIGHT BREAST LUMPECTOMY WITH RADIOACTIVE SEED AND RIGHT SENTINEL LYMPH NODE BIOPSY;  Surgeon: [name], MD;  Location: [facility];  Service: General;  Laterality: Right;  . COLPOSCOPY    . left ovary removed  1978  . PORTACATH PLACEMENT Right 10/29/2016   Procedure: INSERTION PORT-A-CATH WITH [place];  Surgeon: [name], MD;  Location: [facility];  Service: General;  Laterality: Right;  . portacath removal     oct. 5 2018  . ROBOTIC ASSISTED TOTAL HYSTERECTOMY Bilateral 06/16/2017   Procedure: XI ROBOTIC ASSISTED TOTAL HYSTERECTOMY WITH BILATERAL SALPINGECTOMY,  OOPHERECTOMY;  Surgeon: [name], MD;  Location: [facility];  Service: Gynecology;  Laterality: Bilateral;    Past Medical Hx:  Past Medical History:  Diagnosis Date  . Abnormal Pap smear of cervix   . Anxiety   . Asthma   . Asymptomatic microscopic hematuria 09/29/2017  . 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 Concussion    around  age 55  . Diabetes type 2, uncontrolled ([place])    new diagnosis in 08/2016  . DVT (deep venous thrombosis) ([place]) 11/24/2016   in rt arm  . Family history of breast cancer   . Family history of neurofibromatosis   . Family history of ovarian cancer   . History of radiation therapy 04/21/17- 05/20/17   Right Breast 40.05 Gy in 15 fractions followed by a 10 Gy boost in 5 fractions to yield a total dose of 50.05 Gy  . HPV in female   . Hyperlipemia   . Neuromuscular disorder (HCC)    neuropathy  . Personal history of chemotherapy 2018  . Personal history of radiation therapy 2018  . Pneumonia    2014   . PTSD (post-traumatic stress disorder)    Family Hx:  Family History  Problem Relation Age of Onset  . Breast cancer Mother 14  . Hepatitis C Father   . Ovarian cancer Maternal Aunt        dx in her 67s  . Neurofibromatosis Maternal Uncle   . Brain cancer Maternal Uncle 58  . Lung cancer Maternal Grandfather   . Kidney failure Paternal Grandmother   . Heart attack Paternal Grandfather   . Ovarian cancer Maternal Aunt        dx in her 31s  . Neurofibromatosis Maternal Aunt   . Ovarian cancer Maternal Aunt   . Neurofibromatosis Maternal Aunt   . Cervical cancer Maternal Aunt   . Neurofibromatosis Maternal Aunt   . Cancer Maternal Aunt        cancer on the bottom of her foot  . Breast cancer Other        MGF's sisters  . Neurofibromatosis Other        MGM's paternal aunt    Review of Systems:  Review of Systems  Constitutional: Negative.   HENT: Negative.   Eyes: Negative.   Respiratory: Negative.   Cardiovascular: Negative.   Gastrointestinal: Negative.   Genitourinary: Positive for dysuria.  Musculoskeletal: Negative.   Skin: Negative.   Neurological: Positive for dizziness and headaches.  Endo/Heme/Allergies: Negative.   Psychiatric/Behavioral: Negative.     Gyn - pelvic/vaginal pain.   -- cameron to let me know  about smart block personalization --     Vitals:  Blood pressure 117/85, pulse (!) 113, temperature (!) 100.7 F (38.2 C), temperature source Oral, resp. rate 20, height _0  (1.854 m), weight (!) 319 lb (144.7 kg), last menstrual period 01/10/2017, SpO2 100 %. Body mass index is 42.09 kg/m.  Physical Exam:    General :  Well developed, 42 y.o., female in no apparent distress HEENT:  Normocephalic/atraumatic, symmetric, EOMI, eyelids normal Neck:   Supple, no masses.  Lymphatics:  No cervical/ submandibular/ supraclavicular/ infraclavicular/ inguinal adenopathy Respiratory:  Respirations unlabored, no use of accessory muscles CV:   Deferred Breast:  Deferred Musculoskeletal: No CVA tenderness, normal muscle strength. Abdomen:  Wounds are healed soft, non-tender and nondistended. No evidence of hernia. No masses. Extremities:  No lymphedema, no erythema, non-tender. Skin:   Normal inspection Neuro/Psych:  No focal motor deficit, no abnormal mental status. Normal gait. Normal affect. Alert and oriented to person, place, and time  Genito Urinary: Vulva: Normal external female genitalia.  Bladder/urethra: Urethral meatus normal in size and location. No lesions or   masses, well supported bladder Speculum exam: Vagina: Continues to have a mucosal separation anterior vagina which seems to be about 1 x 1 cm at 12:00.  No lesion, no discharge, no bleeding. Cervix: Surgically absent Bimanual exam:  Uterus: Surgically absent  Adnexa: Digital exam revealed no obvious mass  Rectovaginal:  Deferred    Assessment/Plan: 1. Vaginal cuff separation o Continue pelvic rest 2. Fever o Check urinalysis and culture o Obtain CT scan to be sure no pelvic collection 3. We will discuss with Dr. Denman George and see when she would like to follow-up with the patient   Isabel Caprice, MD  10/31/2017, 3:32 PM

## 2017-10-29 NOTE — Telephone Encounter (Signed)
Called and spoke with the patient, scheduled an appt for today

## 2017-10-29 NOTE — Patient Instructions (Signed)
1. Fever workup will include checking your urine and then a scan to be sure no internal infection. 2. Will call with results and followup

## 2017-10-30 ENCOUNTER — Telehealth: Payer: Self-pay

## 2017-10-30 DIAGNOSIS — R509 Fever, unspecified: Secondary | ICD-10-CM

## 2017-10-30 LAB — URINE CULTURE: Culture: <10000 — AB

## 2017-10-30 MED ORDER — CIPROFLOXACIN HCL 250 MG PO TABS: 250.0000 mg | ORAL_TABLET | Freq: Two times a day (BID) | ORAL | 0 refills | Status: DC

## 2017-10-30 NOTE — Telephone Encounter (Signed)
Told Kelly Mendez that Dr. Denman George  said that the CT showed very minimal fluid in abdomen.  This is normal for abdominal surgery. The UA was not suspious for an infection.  Will follow up with the urineculture when results completed. Pt to have complete pelvic rest for another month. No lifting over 5 lbs. Pt states that her tem went up to 102.3 last evening and is 101 today.  No shaking chills or other symptoms. Dr. Denman George empirically sending in Cipro 250 mg po bid x 7 days. Pt to call if she has any vaginal bleeding or severe abdominal pain.

## 2017-10-31 ENCOUNTER — Encounter: Payer: Self-pay | Admitting: Obstetrics

## 2017-11-02 NOTE — Telephone Encounter (Signed)
Told Ms Broxterman that the urine culture came back showing no UTI per Joylene John, NP  Pt states that her fever broke last night. It was up to 102 on Saturday with shaking chills. Pt taking ibuprofen 440 mg every 8-10 hours. Using Oxy Ir 5 mg q hs.  Her pain level is 4-5 10 in abdomen. Pt continues  taking Cipro 250 mg bid. Temp today 99.0 Reviewed with Joylene John NP. Continue ATB and Ibuprofen Told patient that if she develops a temp of 101.5 or greater with or without shaking chills that she needs to go to the ED to be evaluated. Pt verbalized understanding

## 2017-11-20 ENCOUNTER — Other Ambulatory Visit: Payer: Self-pay | Admitting: Family Medicine

## 2017-11-20 DIAGNOSIS — E119 Type 2 diabetes mellitus without complications: Secondary | ICD-10-CM

## 2017-12-22 ENCOUNTER — Encounter: Payer: Self-pay | Admitting: Family Medicine

## 2018-01-11 NOTE — Progress Notes (Signed)
Subjective:    Patient ID: Kelly Mendez, female    DOB: [birth year], 42 y.o.   MRN: [id number]  Kelly Mendez is a 43 y.o. female who presents for follow-up of Type 2 diabetes mellitus.  Moderate amount of blood in her urine at her previous visit and this should be rechecked today. Denies any vaginal bleeding. No urinary symptoms.   Has been getting up once nightly to urinate. This is more frequent than usual. Denies urinary frequency during the day.    Stopped lisinopril due to cough 3 months ago. Does not want another BP pill now.   She is not on Trulicity, ran out 2-3 weeks ago. Taking metformin twice daily. She would like to switch to once daily Metformin due to forgetting her 2nd dose on occasion.   She thinks the Effexor is helping her mood event hough she is taking it for hot flashes. History of depression and anxiety at age 59 but never really felt like she was having issues since her 75s.   On Gabapentin for neuropathy and no better but no worse. Tingling in her fingers and feet sometimes.   Patient is checking home blood sugars.   Home blood sugar records: BGs range between 110 and 100 How often is blood sugars being checked: 3 times a week Current symptoms include: none. Patient denies increased appetite, nausea, visual disturbances, vomiting and weight loss Patient is checking their feet daily. Any Foot concerns (callous, ulcer, wound, thickened nails, toenail fungus, skin fungus, hammer toe): none Last dilated eye exam: [facility]-  2018  Current treatments: doing well on DM Medication compliance: good  Current diet: in general, a "healthy" diet   Current exercise: walking Known diabetic complications: none  The following portions of the patient's history were reviewed and updated as appropriate: allergies, current medications, past medical history, past social history and problem list.  ROS as in subjective above.     Objective:    Physical Exam Alert and  in no distress otherwise not examined.  Blood pressure 120/80, pulse 85, height 6' 1.5" (1.867 m), weight (!) 320 lb 12.8 oz (145.5 kg), last menstrual period 01/10/2017.  Lab Review Diabetic Labs Latest Ref Rng & Units 01/12/2018 09/28/2017 08/11/2017 07/30/2017 06/17/2017  HbA1c 4.0 - 5.6 % 6.7(A) 6.4% - - -  Microalbumin Not estab mg/dL - - - - -  Micro/Creat Ratio <30 mcg/mg creat - - - - -  Chol 100 - 199 mg/dL - 99(L) - - -  HDL >39 mg/dL - 34(L) - - -  Calc LDL 0 - 99 mg/dL - 54 - - -  Triglycerides 0 - 149 mg/dL - 57 - - -  Creatinine 0.57 - 1.00 mg/dL - 0.73 0.83 0.80 0.64   BP/Weight 01/12/2018 10/29/2017 10/21/2017 10/21/2017 8/41/3244  Systolic BP 010 272 536 644 034  Diastolic BP 80 85 84 69 80  Wt. (Lbs) 320.8 319 326.2 326 323.8  BMI 41.75 42.09 43.04 42.43 42.72   Foot/eye exam completion dates 09/28/2017  Foot Form Completion Done    Estelle  reports that she quit smoking about 13 months ago. Her smoking use included cigarettes. She has a 3.75 pack-year smoking history. She has never used smokeless tobacco. She reports that she drinks about 0.6 oz of alcohol per week. She reports that she does not use drugs.     Assessment & Plan:    Controlled type 2 diabetes mellitus with complication, without long-term current use of insulin ([place]) -  Plan: HgB A1c, metFORMIN (GLUCOPHAGE XR) 750 MG 24 hr tablet  ASCVD (arteriosclerotic cardiovascular disease)  Morbid obesity (HCC)  Urinary frequency - Plan: POCT Urinalysis DIP (Proadvantage Device), Urine Culture  Hematuria, unspecified type - Plan: POCT Urinalysis DIP (Proadvantage Device), Urine Microscopic, Urine Culture  Uncontrolled type 2 diabetes mellitus with complication, without long-term current use of insulin ([place]) - Plan: Dulaglutide (TRULICITY) 4.53 MI/6.8EH SOPN  1. Rx changes: switched Metformin, continue Trulicity switch Metformin bid to glucophage XR per patient request due to forgetting to take the 2nd dose. Hgb A1c  6.7%  2. Education: Reviewed 'ABCs' of diabetes management (respective goals in parentheses):  A1C (<7), blood pressure (<130/80), and cholesterol (LDL <100). 3. Compliance at present is estimated to be good. Efforts to improve compliance (if necessary) will be directed at dietary modifications: cut back on carbohydrates , increased exercise and regular blood sugar monitoring: 2-3 times weekly. 4. No longer taking lisinopril due to cough. Declines new prescription for ARB today.  5. Urinalysis dipstick: 1+ blood. Sent for microscopic UA and urine culture. She is aware that she may need a referral to urologist.  6. Follow up: 6 months for DM and chronic health conditions.

## 2018-01-12 ENCOUNTER — Encounter: Payer: Self-pay | Admitting: Family Medicine

## 2018-01-12 ENCOUNTER — Ambulatory Visit (INDEPENDENT_AMBULATORY_CARE_PROVIDER_SITE_OTHER): Payer: Managed Care, Other (non HMO) | Admitting: Family Medicine

## 2018-01-12 VITALS — BP 120/80 | HR 85 | Ht 73.5 in | Wt 320.8 lb

## 2018-01-12 DIAGNOSIS — E1165 Type 2 diabetes mellitus with hyperglycemia: Secondary | ICD-10-CM

## 2018-01-12 DIAGNOSIS — I2510 Atherosclerotic heart disease of native coronary artery without angina pectoris: Secondary | ICD-10-CM

## 2018-01-12 DIAGNOSIS — R319 Hematuria, unspecified: Secondary | ICD-10-CM | POA: Diagnosis not present

## 2018-01-12 DIAGNOSIS — E118 Type 2 diabetes mellitus with unspecified complications: Secondary | ICD-10-CM | POA: Diagnosis not present

## 2018-01-12 DIAGNOSIS — IMO0002 Reserved for concepts with insufficient information to code with codable children: Secondary | ICD-10-CM

## 2018-01-12 DIAGNOSIS — R350 Frequency of micturition: Secondary | ICD-10-CM

## 2018-01-12 LAB — POCT URINALYSIS DIP (PROADVANTAGE DEVICE)
Bilirubin, UA: NEGATIVE
Glucose, UA: NEGATIVE mg/dL
Ketones, POC UA: NEGATIVE mg/dL
Leukocytes, UA: NEGATIVE
Nitrite, UA: NEGATIVE
Protein Ur, POC: NEGATIVE mg/dL
Specific Gravity, Urine: 1.015
Urobilinogen, Ur: NEGATIVE
pH, UA: 6.5 (ref 5.0–8.0)

## 2018-01-12 LAB — POCT GLYCOSYLATED HEMOGLOBIN (HGB A1C): Hemoglobin A1C: 6.7 % — AB (ref 4.0–5.6)

## 2018-01-12 MED ORDER — METFORMIN HCL ER 750 MG PO TB24: 750.0000 mg | ORAL_TABLET | Freq: Every day | ORAL | 3 refills | Status: DC

## 2018-01-12 MED ORDER — DULAGLUTIDE 0.75 MG/0.5ML ~~LOC~~ SOAJ: SUBCUTANEOUS | 1 refills | Status: DC

## 2018-01-12 NOTE — Patient Instructions (Signed)
Call and schedule your diabetic eye exam.    Continue on Trulicity and start on the once daily Metformin XR. Keep an eye on your blood sugars.  Cut back on carbohydrates and sugar. Increase physical activity.   I will call you with your urine results as discussed.   Follow up in 6 months for fasting diabetes or sooner if your blood sugars are not in goal range.

## 2018-01-13 ENCOUNTER — Encounter: Payer: Self-pay | Admitting: Family Medicine

## 2018-01-13 LAB — URINALYSIS, MICROSCOPIC ONLY
Bacteria, UA: NONE SEEN
Casts: NONE SEEN /lpf

## 2018-01-14 LAB — URINE CULTURE

## 2018-01-25 ENCOUNTER — Telehealth: Payer: Self-pay | Admitting: Family Medicine

## 2018-01-25 NOTE — Telephone Encounter (Signed)
Please check on this.

## 2018-01-25 NOTE — Telephone Encounter (Signed)
Diabetic eye exam requested from [facility] per pt's instructions. Received a note back from facility stated they do not have her as a pt.

## 2018-01-25 NOTE — Telephone Encounter (Signed)
Pt says she did indeed had an appt with that office last year. Pt will call and find out what is going on and request records be sent over from their office. [place]

## 2018-02-12 ENCOUNTER — Encounter: Payer: Self-pay | Admitting: Family Medicine

## 2018-02-12 MED ORDER — ALBUTEROL SULFATE HFA 108 (90 BASE) MCG/ACT IN AERS: 2.0000 | INHALATION_SPRAY | Freq: Four times a day (QID) | RESPIRATORY_TRACT | 0 refills | Status: DC | PRN

## 2018-02-16 ENCOUNTER — Telehealth: Payer: Self-pay | Admitting: Obstetrics

## 2018-02-16 NOTE — Telephone Encounter (Signed)
Faxed medical records to [facility] at [phone], Release ID: [id number]

## 2018-02-17 ENCOUNTER — Encounter: Payer: Self-pay | Admitting: Family Medicine

## 2018-02-17 ENCOUNTER — Ambulatory Visit
Admission: RE | Admit: 2018-02-17 | Discharge: 2018-02-17 | Disposition: A | Discharge status: Home / Self Care | Payer: Managed Care, Other (non HMO) | Source: Ambulatory Visit | Attending: Family Medicine | Admitting: Family Medicine

## 2018-02-17 ENCOUNTER — Ambulatory Visit (INDEPENDENT_AMBULATORY_CARE_PROVIDER_SITE_OTHER): Payer: Managed Care, Other (non HMO) | Admitting: Family Medicine

## 2018-02-17 VITALS — BP 122/84 | HR 79 | Temp 98.3°F | Resp 18 | Wt 322.4 lb

## 2018-02-17 DIAGNOSIS — J45909 Unspecified asthma, uncomplicated: Secondary | ICD-10-CM | POA: Diagnosis not present

## 2018-02-17 DIAGNOSIS — J4530 Mild persistent asthma, uncomplicated: Secondary | ICD-10-CM

## 2018-02-17 DIAGNOSIS — F419 Anxiety disorder, unspecified: Secondary | ICD-10-CM

## 2018-02-17 DIAGNOSIS — R0789 Other chest pain: Secondary | ICD-10-CM

## 2018-02-17 DIAGNOSIS — R942 Abnormal results of pulmonary function studies: Secondary | ICD-10-CM

## 2018-02-17 NOTE — Progress Notes (Signed)
   Subjective:    Patient ID: Kelly Mendez, female    DOB: 09-14-1975, 42 y.o.   MRN: [id number]  HPI Chief Complaint  Patient presents with  . other    asthma eval   She is here with complaints of a one month history of cough then over the past week she noticed intermittent shortness of breath, tightness in her chest and wheezing that is improved with albuterol.  States she started training for upcoming 5K.  Using her inhaler twice daily lately. Called and needed a refill so I asked her to come in today for testing.   History of exercise induced asthma in HS. Then since then she has not really had any issues until a pneumonia diagnosis in 2014 and this set off asthma again. Used Advair in the past when she lived in [place].   Reports anxiety and history of panic attacks. States she was cleared to go back to work by Dr. Jana Hakim and working 40 hours per week now. States work is causing her too much stress and anxiety. Wants to but back her hours. Has not been in counseling. States she is feeling the stress and reality of breast cancer treatment, hysterectomy and health issues over the past couple of years.   Denies fever, chills, dizziness,  palpitations, abdominal pain, N/V/D, urinary symptoms, LE edema or leg pain.   Reviewed allergies, medications, past medical, surgical, family, and social history.    Review of Systems Pertinent positives and negatives in the history of present illness.     Objective:   Physical Exam BP 122/84 (BP Location: Left Arm, Patient Position: Sitting)   Pulse 79   Temp 98.3 F (36.8 C)   Resp 18   Wt (!) 322 lb 6.4 oz (146.2 kg)   LMP 01/10/2017   SpO2 98%   BMI 41.96 kg/m  Alert and in no distress.  Pharyngeal area is normal. Neck is supple without adenopathy or thyromegaly. Cardiac exam shows a regular sinus rhythm without murmurs or gallops. Lungs are clear to auscultation. Extremities without edema, intact distal pulses. Skin is warm and dry,  no pallor.      Assessment & Plan:  Mild persistent asthma without complication - Plan: Ambulatory referral to Pulmonology, DG Chest 2 View  Asthma, unspecified asthma severity, unspecified whether complicated, unspecified whether persistent - Plan: Spirometry with graph  Chest tightness or pressure - Plan: DG Chest 2 View  Anxiety  Abnormal PFT - Plan: Ambulatory referral to Pulmonology  Asthma that is normally well controlled is flaring up. Recent exercise and anxiety worsening asthma. She is needing albuterol lately and has not in the recent past.  Abnormal PFTs. Start her on steroid inhaler. Asmanex sample given with instructions for use.  Chest XR ordered.  Referral to pulmonologist.  No sign of DVT or cardiac etiology. Symptoms improve with albuterol.  Anxiety- she will call and schedule with a therapist.  She will also follow up with Dr. Jana Hakim and will discuss cutting hours back at work. She is also due to have a follow up echo since breast cancer treatment.

## 2018-02-17 NOTE — Patient Instructions (Addendum)
Call your oncologist and schedule a follow up.   Start the steroid inhaler. Rinse your mouth out after each use.   The pulmonologist will call you.   You can call to schedule your appointment with the Counselor. A few offices are listed below for you to call.   [facility] Lessie D[facility] [address] [place], [place] [address] Marissa P.A  [place], [place], [place] [address]  Phone: [phone]  [facility] [address] [place], [place] [address]  Phone: [phone]  [facility]  [address], [place], [place] [address] Phone: [phone]

## 2018-02-18 ENCOUNTER — Encounter: Payer: Self-pay | Admitting: Family Medicine

## 2018-02-18 ENCOUNTER — Encounter: Payer: Self-pay | Admitting: Oncology

## 2018-02-19 ENCOUNTER — Telehealth: Payer: Self-pay | Admitting: Family Medicine

## 2018-02-19 ENCOUNTER — Telehealth: Payer: Self-pay | Admitting: Oncology

## 2018-02-19 MED ORDER — ALBUTEROL SULFATE HFA 108 (90 BASE) MCG/ACT IN AERS: 2.0000 | INHALATION_SPRAY | Freq: Four times a day (QID) | RESPIRATORY_TRACT | 0 refills | Status: DC | PRN

## 2018-02-19 NOTE — Telephone Encounter (Signed)
Per 8/8 sch msg, patient nees appt within next two weeks.  Made appt for August 15 @ 12:15 pm.  Patient my chart acitve.

## 2018-02-19 NOTE — Telephone Encounter (Signed)
Recv'd fax from [facility] Albuterol not covered, switched to covered Proair per Vickie.  Called [facility] and changed

## 2018-02-23 NOTE — Progress Notes (Signed)
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 This showed a possible mass in the right breast. On 10/13/2016 she underwent bilateral diagnostic mammography with tomography and right breast ultrasonography. This found the breast density to be category B. In the upper inner quadrant of the right breast there was a circumscribed mass which was barely palpable. Ultrasonography confirmed a 0.9 cm right breast mass at the 1:00 radiant 18 cm from the nipple ultrasound of the axilla showed 1 indeterminate right axillary lymph node with borderline thickening of the anterior cortex.  On 10/16/2016 the patient underwent biopsy of the right breast mass and the suspicious right axillary lymph node. The right breast mass proved to be an invasive ductal carcinoma, grade 3, estrogen and progesterone receptor negative, with an MIB-1 of 80%, and no HER-2 amplification, the signals ratio being 1.30 and the number per cell 1.75. The lymph node was negative and concordant.  Her subsequent history is as detailed below  INTERVAL HISTORY: Kelly Mendez returns today for follow-up and treatment of her triple negative breast cancer. She continues under observation. She takes Neurontin for peripheral neuropathy, which has helps some: Her symptoms have not increased. She also takes gabapentin for hot flashes, and those are better.  Since her last visit,  she underwent diagnostic bilateral mammography with CAD and tomography and right ultrasonography on 10/07/2017 at [facility] showing: breast density category B. There were probably benign right breast calcifications near the lumpectomy site favored to be fat necrosis. There was a probably benign oval mass in the upper-outer right breast. A 13-monthrepeat was reccomended.    REVIEW OF SYSTEMS: ATeelaincreased working to 30-40 hours per week. She is having anxiety and fatigue. She feels that she let her emotions build up and, then she started having asthma attacks. She followed up with her PCP, but she also feels that she needs a therapist. She feels overwhelmed.  She wants to give her work at 100% but has not been able to and this causes her a great deal of guilt.  She has not been able to work for the past few weeks.  She felt a bump in her right axilla, and the was nervous about this. She walks and jogs for 15 minutes, but she is planning to increase this to participate in the Breast Cancer 5K in [place]. She denies unusual headaches, visual changes, nausea, vomiting, or dizziness. There has been no unusual cough, phlegm production, or pleurisy. There has been no change in bowel or bladder habits. She denies unexplained fatigue or unexplained weight loss, bleeding, rash, or fever. A detailed review of systems was otherwise stable.    PAST MEDICAL HISTORY: Past Medical History:  Diagnosis Date  . Abnormal Pap smear of cervix   . Anxiety   . Asthma   . Asymptomatic microscopic hematuria 09/29/2017  . Cancer (HCC)    Right breast ca triple negative  stage 20 grade 3  . Concussion    around age 42 .  Diabetes type 2, uncontrolled ([facility])    new diagnosis in 08/2016  . DVT (deep venous thrombosis) ([facility]) 11/24/2016   in rt arm  . Family history of breast cancer   . Family history of neurofibromatosis   . Family history of ovarian cancer   . History of radiation therapy 04/21/17- 05/20/17    Right Breast 40.05 Gy in 15 fractions followed by a 10 Gy boost in 5 fractions to yield a total dose of 50.05 Gy  . HPV in female   . Hyperlipemia   . Neuromuscular disorder (HCC)    neuropathy  . Personal history of chemotherapy 2018  . Personal history of radiation therapy 2018  . Pneumonia    2014   . PTSD (post-traumatic stress disorder)     PAST SURGICAL HISTORY: Past Surgical History:  Procedure Laterality Date  . BREAST LUMPECTOMY Right 03/18/2017  . BREAST LUMPECTOMY WITH RADIOACTIVE SEED AND SENTINEL LYMPH NODE BIOPSY Right 03/18/2017   Procedure: RIGHT BREAST LUMPECTOMY WITH RADIOACTIVE SEED AND RIGHT SENTINEL LYMPH NODE BIOPSY;  Surgeon: [name], MD;  Location: [facility];  Service: General;  Laterality: Right;  . COLPOSCOPY    . left ovary removed  1978   was told her ovary was removed but Dr. Denman George found that was not the case. Suspect this was originally an ovarian cystectomy  . PORTACATH PLACEMENT Right 10/29/2016   Procedure: INSERTION PORT-A-CATH WITH [place];  Surgeon: [name], MD;  Location: [facility];  Service: General;  Laterality: Right;  . portacath removal     oct. 5 2018  . ROBOTIC ASSISTED TOTAL HYSTERECTOMY Bilateral 06/16/2017   Procedure: XI ROBOTIC ASSISTED TOTAL HYSTERECTOMY WITH BILATERAL SALPINGECTOMY,  OOPHERECTOMY;  Surgeon: [name], MD;  Location: [facility];  Service: Gynecology;  Laterality: Bilateral;    FAMILY HISTORY Family History  Problem Relation Age of Onset  . Breast cancer Mother 21  . Hepatitis C Father   . Ovarian cancer Maternal Aunt        dx in her 70s  . Neurofibromatosis Maternal Uncle   . Brain cancer Maternal Uncle 58  . Lung cancer Maternal Grandfather   . Kidney failure Paternal Grandmother   . Heart attack Paternal Grandfather   . Ovarian cancer Maternal Aunt        dx in her 20s  . Neurofibromatosis Maternal Aunt   . Ovarian cancer Maternal Aunt   . Neurofibromatosis Maternal Aunt   . Cervical  cancer Maternal Aunt   . Neurofibromatosis Maternal Aunt   . Cancer Maternal Aunt        cancer on the bottom of her foot  . Breast cancer Other        MGF's sisters  . Neurofibromatosis Other        MGM's paternal aunt  The patient's mother was diagnosed with breast cancer at age 60, but tells me the lump in her breast had been present for at least 10 years prior to that. She is now 56 and doing well. The patient's father died at the age of 69 from sepsis following liver transplantation. The patient has 2 brothers, no sisters. The patient tells me that she has at least 5 and sent cousins with breast cancer and there are other relatives with uterine cancer.  GYNECOLOGIC HISTORY:  Patient's last menstrual period was 01/10/2017. Menarche age 68, first live birth age 75, the patient is GX P1. She has had multiple progesterone and oral contraceptive treatments through [facility] because  of her menometrorrhagia. 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 Her husband [name] is a truck Geophysicist/field seismologist. The patient's daughter [name] is studying pre-veterinary medicine in college. [name] has 3 children, all boys,Akim is a cook in [place] and lives independently. The 2 younger boys, [name] and [name], aged 36 and 54 as of April 2018, are at home with the patient    ADVANCED DIRECTIVES: Not in place   HEALTH MAINTENANCE: Social History   Tobacco Use  . Smoking status: Former Smoker    Packs/day: 0.15    Years: 25.00    Pack years: 3.75    Types: Cigarettes    Last attempt to quit: 11/25/2016    Years since quitting: 1.2  . Smokeless tobacco: Never Used  Substance Use Topics  . Alcohol use: Yes    Alcohol/week: 1.0 standard drinks    Types: 1 Glasses of wine per week    Comment: rarely   . Drug use: No     Colonoscopy: n/a  PAP:  Bone density:   Allergies  Allergen Reactions  . Lisinopril     Cough, itchy  throat  . Penicillins Other (See Comments)    As a child Has patient had a PCN reaction causing immediate rash, facial/tongue/throat swelling, SOB or lightheadedness with hypotension: unknown Has patient had a PCN reaction causing severe rash involving mucus membranes or skin necrosis: unknown Has patient had a PCN reaction that required hospitalization unknown Has patient had a PCN reaction occurring within the last 10 years: no If all of the above answers are "NO", then may proceed with Cephalosporin use.     Current Outpatient Medications  Medication Sig Dispense Refill  . albuterol (PROAIR HFA) 108 (90 Base) MCG/ACT inhaler Inhale 2 puffs into the lungs every 6 (six) hours as needed for wheezing or shortness of breath. 1 Inhaler 0  . atorvastatin (LIPITOR) 20 MG tablet TAKE 1 TABLET BY MOUTH ONCE DAILY 30 tablet 2  . BIOTIN PO Take 1 capsule by mouth daily.    . Dulaglutide (TRULICITY) 7.34 YZ/7.0DU SOPN INJECT 1  SUBCUTANEOUSLY ONCE A WEEK 12 pen 1  . gabapentin (NEURONTIN) 300 MG capsule Take 1 capsule (300 mg total) by mouth at bedtime. 90 capsule 4  . Glucose Blood (BLOOD GLUCOSE TEST STRIPS) STRP Test twice a day. Pt uses one touch verio flex meter 100 each 5  . ibuprofen (ADVIL,MOTRIN) 200 MG tablet Take 400 mg by mouth daily as needed for headache or moderate pain.     . metFORMIN (GLUCOPHAGE XR) 750 MG 24 hr tablet Take 1 tablet (750 mg total) by mouth daily with breakfast. 30 tablet 3  . ONETOUCH DELICA LANCETS FINE MISC Test twice a day 100 each 5  . venlafaxine XR (EFFEXOR-XR) 75 MG 24 hr capsule Take 1 capsule (75 mg total) by mouth daily with breakfast. 90 capsule 4   No current facility-administered medications for this visit.     OBJECTIVE: Young-appearing African-American woman who was tearful during today's visit  Vitals:   02/25/18 1215  BP: 112/89  Pulse: 86  Resp: 20  Temp: 98.6 F (37 C)  SpO2: 98%     Body mass index is 42.05 kg/m.    ECOG FS:1 -  Symptomatic but completely ambulatory  Sclerae unicteric, EOMs intact Oropharynx clear and moist No cervical or supraclavicular adenopathy Lungs no rales or rhonchi Heart regular rate and rhythm Abd soft, nontender,  positive bowel sounds MSK no focal spinal tenderness, no upper extremity lymphedema Neuro: nonfocal, well oriented, depressed and anxious affect Breasts: The right breast is status post lumpectomy and radiation.  In the right axilla, at the lateral most aspect of the scar, there is a subjacent mass measuring approximately 1 to 1-1/2 cm maximally, which is firm, not associated with erythema, and slightly tender to palpation.  The left breast is benign.  The left axilla is benign.  LAB RESULTS:  CMP     Component Value Date/Time   NA 143 09/28/2017 1539   NA 140 05/07/2017 1221   K 4.6 09/28/2017 1539   K 4.0 05/07/2017 1221   CL 105 09/28/2017 1539   CO2 18 (L) 09/28/2017 1539   CO2 24 05/07/2017 1221   GLUCOSE 156 (H) 09/28/2017 1539   GLUCOSE 128 08/11/2017 1347   GLUCOSE 173 (H) 05/07/2017 1221   BUN 13 09/28/2017 1539   BUN 6.7 (L) 05/07/2017 1221   CREATININE 0.73 09/28/2017 1539   CREATININE 0.8 05/07/2017 1221   CALCIUM 9.6 09/28/2017 1539   CALCIUM 9.7 05/07/2017 1221   PROT 6.8 09/28/2017 1539   PROT 6.8 05/07/2017 1221   ALBUMIN 4.3 09/28/2017 1539   ALBUMIN 3.4 (L) 05/07/2017 1221   AST 21 09/28/2017 1539   AST 21 05/07/2017 1221   ALT 26 09/28/2017 1539   ALT 24 05/07/2017 1221   ALKPHOS 127 (H) 09/28/2017 1539   ALKPHOS 131 05/07/2017 1221   BILITOT 0.2 09/28/2017 1539   BILITOT 0.48 05/07/2017 1221   GFRNONAA 103 09/28/2017 1539   GFRAA 118 09/28/2017 1539    No results found for: TOTALPROTELP, ALBUMINELP, A1GS, A2GS, BETS, BETA2SER, GAMS, MSPIKE, SPEI  No results found for: Nils Pyle, [facility]  Lab Results  Component Value Date   WBC 6.2 09/28/2017   NEUTROABS 4.5 09/28/2017   HGB 12.9 09/28/2017   HCT 38.1  09/28/2017   MCV 80 09/28/2017   PLT 235 09/28/2017      Chemistry      Component Value Date/Time   NA 143 09/28/2017 1539   NA 140 05/07/2017 1221   K 4.6 09/28/2017 1539   K 4.0 05/07/2017 1221   CL 105 09/28/2017 1539   CO2 18 (L) 09/28/2017 1539   CO2 24 05/07/2017 1221   BUN 13 09/28/2017 1539   BUN 6.7 (L) 05/07/2017 1221   CREATININE 0.73 09/28/2017 1539   CREATININE 0.8 05/07/2017 1221      Component Value Date/Time   CALCIUM 9.6 09/28/2017 1539   CALCIUM 9.7 05/07/2017 1221   ALKPHOS 127 (H) 09/28/2017 1539   ALKPHOS 131 05/07/2017 1221   AST 21 09/28/2017 1539   AST 21 05/07/2017 1221   ALT 26 09/28/2017 1539   ALT 24 05/07/2017 1221   BILITOT 0.2 09/28/2017 1539   BILITOT 0.48 05/07/2017 1221       No results found for: LABCA2  No components found for: [id number]  No results for input(s): INR in the last 168 hours.  Urinalysis    Component Value Date/Time   COLORURINE YELLOW 10/29/2017 1532   APPEARANCEUR CLEAR 10/29/2017 1532   LABSPEC 1.015 01/12/2018 1100   PHURINE 5.0 10/29/2017 1532   GLUCOSEU 50 (A) 10/29/2017 1532   HGBUR MODERATE (A) 10/29/2017 1532   BILIRUBINUR negative 01/12/2018 1100   BILIRUBINUR n 09/29/2016 1045   KETONESUR negative 01/12/2018 1100   KETONESUR NEGATIVE 10/29/2017 1532   PROTEINUR negative 01/12/2018 1100   PROTEINUR 30 (A) 10/29/2017  1532   UROBILINOGEN negative 09/29/2016 1045   NITRITE Negative 01/12/2018 1100   NITRITE NEGATIVE 10/29/2017 1532   LEUKOCYTESUR Negative 01/12/2018 1100     STUDIES: Dg Chest 2 View  Result Date: 02/18/2018 CLINICAL DATA:  Chest pain. EXAM: CHEST - 2 VIEW COMPARISON:  Radiographs of December 15, 2016. FINDINGS: The heart size and mediastinal contours are within normal limits. Both lungs are clear. No pneumothorax or pleural effusion is noted. The visualized skeletal structures are unremarkable. IMPRESSION: No active cardiopulmonary disease. Electronically Signed   By: Marijo Conception,  M.D.   On: 02/18/2018 10:20    Since her last visit, she underwent diagnostic bilateral mammography with CAD and tomography and right ultrasonography on 10/07/2017 at [facility] showing: breast density category B. There were probably benign right breast calcifications near the lumpectomy site favored to be fat necrosis. There was a probably benign oval mass in the upper-outer right breast. A 86-monthrepeat was reccomended.   ELIGIBLE FOR AVAILABLE RESEARCH PROTOCOL: not a candidate for PREVENT  ASSESSMENT: 42y.o.  [place] woman status post right breast upper inner quadrant biopsy 10/16/2016 for a clinical T1b pN0, stage IB invasive ductal carcinoma, grade 3, triple negative, with an MIB-1 of 80%.  (1)  genetics testing 12/01/2016 showed several variants of uncertain significance but no deleterious mutation  (2) neoadjuvant chemotherapy consisting of doxorubicin and cyclophosphamide in dose dense fashion 4 starting 11/05/2016, received third cycle 12/03/2016 then proceeded to weekly Abraxane starting 01/01/2017  (a) cycle 4 of cyclophosphamide and doxorubicin given at end of Abraxane on 02/12/2017  (b) Abraxane given from 01/01/17-01/22/17 (4 cycles), stopped early due to peripheral neuropathy  (3) status post right lumpectomy and sentinel lymph node sampling 03/18/2017 for a ypT1b ypN0 invasive ductal carcinoma, grade 3, with negative margins, and repeat prognostic panel again triple negative  (4) adjuvant radiation completed 05/20/2017 Site/dose:  The right breast was treated to 40.05 Gy in 15 fractions, followed by a 10 Gy boost in 5 fractions to yield a total dose of 50.05 Gy  (5) extensive right upper extremity deep venous thrombosis documented 11/23/2016, treated initially with Lovenox  (a) transitioned to rivaroxaban as of 11/30/2016  (b) total 6 months anticoagulation planned (one month beyond port removal)  (6) tobacco abuse: The patient quit smoking 11/26/2016  (7)  menometrorrhagia, with a cystic right adnexal lesion and a possible cervical polyp noted on ultrasound 12/16/2016, with benign endocervical curettage and endometrial biopsy 12/16/2016  (a) CA 125 12/17/2016 was 14.2 (normal).  (b) adnexal mass, likely benign, will be explored at the completion of chemotherapy  (c) goserelin started 12/16/2016, continued every 28 days, last dose 06/05/2017  (d) status post hysterectomy and bilateral salpingo-oophorectomy 06/16/2017  (8) iron deficiency anemia secondary to menometrorrhagia, status post Feraheme 05/20/2017 and 06/01/2017  (9) likely thalassemia trait  (10) post traumatic stress disorder  (a) venlafaxine increased to 250 mg daily as of 02/25/2018  (b) referred for counseling    PLAN: ACasarais now just about a year out from definitive surgery for her breast cancer with no evidence of disease recurrence.  This is favorable.  She does have a mass in her right axilla which she feels is new.  This is closely associated with her incision scar and so it is most likely scar tissue, but it does warrant evaluation and I am setting her up for right mammography and right ultrasonography this week.  This may require biopsy.  She is exhibiting classic symptoms of  posttraumatic stress disorder.  She has limited ability to concentrate, significant fatigue, mood disturbance, and right now I do not think she is going to be able to return to work.  She may need perhaps 6 or 8 weeks off some counseling and time for an increased dose of venlafaxine to help her out.  I am referring her to our chaplain for the counseling part and we discussed the venlafaxine today.  I placed the appropriate prescription in for her.  She met with our navigator in any papers that she will need filled we will be glad to help her with.   I also strongly encouraged to participate in our "finding your new normal" group  I do expect that she will be able to return to work initially  part-time and eventually full-time.  She is very med motivated to do that and does love her job.  She will see me again in about 6 weeks.  She knows to call for any other issues that may develop before the next visit.  Magrinat, Virgie Dad, MD  02/25/18 12:26 PM [facility] [address] [place], [place] [address] Tel. [phone]    Fax. [phone]  Alice Rieger, am acting as scribe for Chauncey Cruel MD.  I, Lurline Del MD, have reviewed the above documentation for accuracy and completeness, and I agree with the above.

## 2018-02-25 ENCOUNTER — Encounter: Payer: Self-pay | Admitting: General Practice

## 2018-02-25 ENCOUNTER — Inpatient Hospital Stay: Payer: Managed Care, Other (non HMO) | Attending: Oncology | Admitting: Oncology

## 2018-02-25 VITALS — BP 112/89 | HR 86 | Temp 98.6°F | Resp 20 | Ht 73.5 in | Wt 323.1 lb

## 2018-02-25 DIAGNOSIS — Z86718 Personal history of other venous thrombosis and embolism: Secondary | ICD-10-CM | POA: Insufficient documentation

## 2018-02-25 DIAGNOSIS — F4310 Post-traumatic stress disorder, unspecified: Secondary | ICD-10-CM | POA: Diagnosis not present

## 2018-02-25 DIAGNOSIS — N6310 Unspecified lump in the right breast, unspecified quadrant: Secondary | ICD-10-CM

## 2018-02-25 DIAGNOSIS — C50211 Malignant neoplasm of upper-inner quadrant of right female breast: Secondary | ICD-10-CM | POA: Insufficient documentation

## 2018-02-25 DIAGNOSIS — Z87891 Personal history of nicotine dependence: Secondary | ICD-10-CM | POA: Insufficient documentation

## 2018-02-25 DIAGNOSIS — G620 Drug-induced polyneuropathy: Secondary | ICD-10-CM | POA: Diagnosis not present

## 2018-02-25 DIAGNOSIS — Z171 Estrogen receptor negative status [ER-]: Secondary | ICD-10-CM | POA: Diagnosis not present

## 2018-02-25 DIAGNOSIS — N921 Excessive and frequent menstruation with irregular cycle: Secondary | ICD-10-CM | POA: Diagnosis not present

## 2018-02-25 DIAGNOSIS — N951 Menopausal and female climacteric states: Secondary | ICD-10-CM

## 2018-02-25 MED ORDER — VENLAFAXINE HCL ER 150 MG PO CP24: 150.0000 mg | ORAL_CAPSULE | Freq: Every day | ORAL | 4 refills | Status: DC

## 2018-02-26 ENCOUNTER — Telehealth: Payer: Self-pay | Admitting: Oncology

## 2018-02-26 NOTE — Progress Notes (Signed)
[facility] Spiritual Care Note  Referred by navigator [facility]ini/RN for emotional support as pt was tearful during appt. Provided pastoral presence, empathic listening, normalization of feelings, and emotional support as she shared and processed concerns related to dx/tx, caring for family, etc. Plan to f/u by phone next week to check in and discern further f/u plan.   [place], [place], [facility] Pager [phone] Voicemail [phone]

## 2018-02-26 NOTE — Telephone Encounter (Signed)
Tried to call regarding 10/2 I did leave a message. The printer was down at the time did not mail

## 2018-03-01 ENCOUNTER — Encounter: Payer: Self-pay | Admitting: General Practice

## 2018-03-01 NOTE — Progress Notes (Signed)
[facility] Spiritual Care Note  Followed up with Kelly Mendez by phone to offer support and encouragement after her distress last week. Per pt, she is getting the week off to a good start. She welcomes calls from [facility] and [place]/counseling intern, including detailed voicemails if needed, for broadening her oncology-familiar support network. Kelly Mendez verbalized appreciation for caring call and knows to contact chaplain whenever needed/desired as well.   [place], [place], [facility] Pager [phone] Voicemail [phone]

## 2018-03-10 NOTE — Progress Notes (Signed)
FMLA successfully faxed to [employer] at [phone]. Mailed copy to patient address on file.

## 2018-03-17 NOTE — Progress Notes (Signed)
FMLA successfully faxed to [employer] at [phone]. Mailed copy to patient address on file.

## 2018-03-19 NOTE — Progress Notes (Signed)
The patient presented to session with an anxious mood and matching affect. She reported that she has experienced an anxious and depressed mood for the last several months. The patient expressed that she is struggling to transition into life as a cancer survivor and balance all of her life roles. Upon intake assessment, the patient denied any current suicidal ideation. The patient reported that she is currently taking the medication Effexor. The patient identified her coping skills as spending time in her room and seeking support from her husband, daughter, mother, and godmother. In session, the counselor provided reflections of feeling and meaning, as well as empathetic presence and validation. The patient was forthcoming, active, and engaged throughout session. In future sessions, the counselor and patient will further explore the patient's goals for counseling, as well as how the patient's resources can be utilized for coping.

## 2018-03-22 ENCOUNTER — Encounter: Payer: Self-pay | Admitting: Emergency Medicine

## 2018-03-22 ENCOUNTER — Ambulatory Visit (INDEPENDENT_AMBULATORY_CARE_PROVIDER_SITE_OTHER): Payer: Managed Care, Other (non HMO) | Admitting: Emergency Medicine

## 2018-03-22 VITALS — BP 120/74 | HR 94 | Ht 73.0 in | Wt 327.6 lb

## 2018-03-22 DIAGNOSIS — J45909 Unspecified asthma, uncomplicated: Secondary | ICD-10-CM | POA: Diagnosis not present

## 2018-03-22 DIAGNOSIS — J4520 Mild intermittent asthma, uncomplicated: Secondary | ICD-10-CM | POA: Diagnosis not present

## 2018-03-22 NOTE — Assessment & Plan Note (Signed)
Intermittent symptoms but she did flare recently.  She was started empirically on Asmanex but is not sure that has been beneficial.  Like to stop it so that we can obtain full PFT.  She will keep your albuterol available to use as needed.  If she is able to continue with the same pattern as the last several years then she will likely be able to avoid maintenance therapy.  Please stop Asmanex for now. Keep your albuterol available to use 2 puffs every 4 hours as needed for shortness of breath, chest tightness, wheezing. We will perform full pulmonary function testing at your next visit. Follow with Dr Lamonte Sakai next available with full PFT on the same day

## 2018-03-22 NOTE — Patient Instructions (Signed)
Please stop Asmanex for now. Keep your albuterol available to use 2 puffs every 4 hours as needed for shortness of breath, chest tightness, wheezing. We will perform full pulmonary function testing at your next visit. Follow with Dr Lamonte Sakai next available with full PFT on the same day

## 2018-03-22 NOTE — Progress Notes (Signed)
Subjective:    Patient ID: Kelly Mendez, female    DOB: 1976-04-22, 42 y.o.   MRN: [id number]  HPI 42 year old former smoker (5 pack years) with a history of breast cancer (lumpectomy, followed by Dr. Jana Hakim), right upper extremity DVT, diabetes, probably childhood asthma. She had BD in middle school and then had a respite. Then developed sx in the mid 2000's, had PFT, was started on Advair + albuterol, then was able to come off the Advair for a long while. She has long periods of stability. She began to have dyspnea with exertion, chest pressure, some wheeze w exertion in late July.  She got some albuterol soon after from her PCP and had spiro below. She was given asmanex on 8/7, taking bid. Unsure whether it was helpful. Her sx have improved some, not using albuterol frequently  Spirometry done 02/17/2018 was reviewed by me, shows possible mixed restriction with obstruction.   Review of Systems  Constitutional: Negative for fever and unexpected weight change.  HENT: Positive for congestion, dental problem, postnasal drip, sneezing and sore throat. Negative for nosebleeds, rhinorrhea, sinus pressure and trouble swallowing.   Eyes: Negative for redness and itching.  Respiratory: Positive for cough, chest tightness, shortness of breath and wheezing.   Cardiovascular: Positive for palpitations. Negative for leg swelling.  Gastrointestinal: Negative for nausea and vomiting.  Genitourinary: Negative for dysuria.  Musculoskeletal: Negative for joint swelling.  Skin: Negative for rash.  Allergic/Immunologic: Negative.  Negative for environmental allergies, food allergies and immunocompromised state.  Neurological: Positive for headaches.  Hematological: Bruises/bleeds easily.  Psychiatric/Behavioral: Positive for dysphoric mood. The patient is nervous/anxious.    Past Medical History:  Diagnosis Date  . Abnormal Pap smear of cervix   . Anxiety   . Asthma   . Asymptomatic microscopic  hematuria 09/29/2017  . 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 Concussion    around age 8  . Diabetes type 2, uncontrolled ([place])    new diagnosis in 08/2016  . DVT (deep venous thrombosis) ([place]) 11/24/2016   in rt arm  . Family history of breast cancer   . Family history of neurofibromatosis   . Family history of ovarian cancer   . History of radiation therapy 04/21/17- 05/20/17   Right Breast 40.05 Gy in 15 fractions followed by a 10 Gy boost in 5 fractions to yield a total dose of 50.05 Gy  . HPV in female   . Hyperlipemia   . Neuromuscular disorder (HCC)    neuropathy  . Personal history of chemotherapy 2018  . Personal history of radiation therapy 2018  . Pneumonia    2014   . PTSD (post-traumatic stress disorder)      Family History  Problem Relation Age of Onset  . Breast cancer Mother 14  . Hepatitis C Father   . Ovarian cancer Maternal Aunt        dx in her 30s  . Neurofibromatosis Maternal Uncle   . Brain cancer Maternal Uncle 58  . Lung cancer Maternal Grandfather   . Kidney failure Paternal Grandmother   . Heart attack Paternal Grandfather   . Ovarian cancer Maternal Aunt        dx in her 94s  . Neurofibromatosis Maternal Aunt   . Ovarian cancer Maternal Aunt   . Neurofibromatosis Maternal Aunt   . Cervical cancer Maternal Aunt   . Neurofibromatosis Maternal Aunt   . Cancer Maternal Aunt  cancer on the bottom of her foot  . Breast cancer Other        MGF's sisters  . Neurofibromatosis Other        MGM's paternal aunt     Social History   Socioeconomic History  . Marital status: Married    Spouse name: Not on file  . Number of children: Not on file  . Years of education: Not on file  . Highest education level: Not on file  Occupational History  . Not on file  Social Needs  . Financial resource strain: Not on file  . Food insecurity:    Worry: Not on file    Inability: Not on file  . Transportation needs:     Medical: Not on file    Non-medical: Not on file  Tobacco Use  . Smoking status: Former Smoker    Packs/day: 0.15    Years: 25.00    Pack years: 3.75    Types: Cigarettes    Last attempt to quit: 11/25/2016    Years since quitting: 1.3  . Smokeless tobacco: Never Used  Substance and Sexual Activity  . Alcohol use: Yes    Alcohol/week: 1.0 standard drinks    Types: 1 Glasses of wine per week    Comment: rarely   . Drug use: No  . Sexual activity: Not Currently    Birth control/protection: Surgical    Comment: husband vasectomy  Lifestyle  . Physical activity:    Days per week: Not on file    Minutes per session: Not on file  . Stress: Not on file  Relationships  . Social connections:    Talks on phone: Not on file    Gets together: Not on file    Attends religious service: Not on file    Active member of club or organization: Not on file    Attends meetings of clubs or organizations: Not on file    Relationship status: Not on file  . Intimate partner violence:    Fear of current or ex partner: Not on file    Emotionally abused: Not on file    Physically abused: Not on file    Forced sexual activity: Not on file  Other Topics Concern  . Not on file  Social History Narrative  . Not on file  She has lived in [place], [name], [place].  She has worked Therapist, art, a lot of talking.  [employer] [place] [employer], ? Asbestos exposure.  No water damage  3-4 cig a day teens to age 76   Allergies  Allergen Reactions  . Lisinopril     Cough, itchy throat  . Penicillins Other (See Comments)    As a child Has patient had a PCN reaction causing immediate rash, facial/tongue/throat swelling, SOB or lightheadedness with hypotension: unknown Has patient had a PCN reaction causing severe rash involving mucus membranes or skin necrosis: unknown Has patient had a PCN reaction that required hospitalization unknown Has patient had a PCN reaction occurring within the last 10 years: no If  all of the above answers are "NO", then may proceed with Cephalosporin use.      Outpatient Medications Prior to Visit  Medication Sig Dispense Refill  . albuterol (PROAIR HFA) 108 (90 Base) MCG/ACT inhaler Inhale 2 puffs into the lungs every 6 (six) hours as needed for wheezing or shortness of breath. 1 Inhaler 0  . atorvastatin (LIPITOR) 20 MG tablet TAKE 1 TABLET BY MOUTH ONCE DAILY 30 tablet 2  .  BIOTIN PO Take 1 capsule by mouth daily.    . Dulaglutide (TRULICITY) 9.56 LO/7.5IE SOPN INJECT 1  SUBCUTANEOUSLY ONCE A WEEK 12 pen 1  . gabapentin (NEURONTIN) 300 MG capsule Take 1 capsule (300 mg total) by mouth at bedtime. 90 capsule 4  . Glucose Blood (BLOOD GLUCOSE TEST STRIPS) STRP Test twice a day. Pt uses one touch verio flex meter 100 each 5  . ibuprofen (ADVIL,MOTRIN) 200 MG tablet Take 400 mg by mouth daily as needed for headache or moderate pain.     . metFORMIN (GLUCOPHAGE XR) 750 MG 24 hr tablet Take 1 tablet (750 mg total) by mouth daily with breakfast. 30 tablet 3  . ONETOUCH DELICA LANCETS FINE MISC Test twice a day 100 each 5  . PREMARIN vaginal cream INSERT 1 APPLICATORFUL VAGINALLY AT BEDTIME  12  . venlafaxine XR (EFFEXOR-XR) 150 MG 24 hr capsule Take 1 capsule (150 mg total) by mouth daily with breakfast. 90 capsule 4   No facility-administered medications prior to visit.         Objective:   Physical Exam Vitals:   03/22/18 1356  BP: 120/74  Pulse: 94  SpO2: 99%  Weight: (!) 327 lb 9.6 oz (148.6 kg)  Height: 6\' 1"  (1.854 m)   Gen: Pleasant, overweight woman, in no distress,  normal affect  ENT: No lesions,  mouth clear,  oropharynx clear, no postnasal drip  Neck: No JVD, no stridor  Lungs: No use of accessory muscles, clear bilaterally with no wheezing or crackles  Cardiovascular: RRR, heart sounds normal, no murmur or gallops, no peripheral edema  Musculoskeletal: No deformities, no cyanosis or clubbing  Neuro: alert, non focal  Skin: Warm, no  lesions or rash      Assessment & Plan:  Mild intermittent asthma Intermittent symptoms but she did flare recently.  She was started empirically on Asmanex but is not sure that has been beneficial.  Like to stop it so that we can obtain full PFT.  She will keep your albuterol available to use as needed.  If she is able to continue with the same pattern as the last several years then she will likely be able to avoid maintenance therapy.  Please stop Asmanex for now. Keep your albuterol available to use 2 puffs every 4 hours as needed for shortness of breath, chest tightness, wheezing. We will perform full pulmonary function testing at your next visit. Follow with Dr Lamonte Sakai next available with full PFT on the same day  Baltazar Apo, MD, PhD 03/22/2018, 2:49 PM [facility] [phone] or if no answer [phone]

## 2018-03-26 ENCOUNTER — Telehealth: Payer: Self-pay | Admitting: *Deleted

## 2018-03-26 NOTE — Progress Notes (Signed)
[facility]ing Session  The patient presented to session with a calm mood and matching affect. The counselor finished gathering intake information and assessed the patient for homicidal ideation, as this assessment was not made in the intake session. In response, the patient denied any past or current homicidal ideation. The rest of session was spent exploring the patient's experience of anxiety. The patient reported getting 4-6 hours of restless sleep each night. The patient expressed that her cancer experience changed her, and she is struggling to adjust to life after cancer. The counselor and patient discussed keeping a record over the next week of what thoughts the patient experiences prior to feeling anxious. The patient is scheduled for another appointment on April 02, 2018.

## 2018-04-02 NOTE — Progress Notes (Signed)
[facility]ing Session  The patient presented to session with a cheerful mood and matching affect. The patient appeared more energetic than in previous sessions, as displayed through her responsiveness in session.  The patient shared that over the last week, she has had time to herself at home. The patient expressed that this has helped her to be present in the moment. The patient shared that through her homework from last session, she was able to identify that "sensory overload" is often what prompts her experiences of anxiety. The patient shared that not leaving the TV on while sleeping and meditating have helped her to prevent this sensory overload. However, the patient reported that on a scale from 1 to 10, with 1 being the lowest and 10 being the highest, her anxiety has been at a 5 this week. The patient shared that normally, she is around a 3, but she is nervous about her mammogram on Monday. The patient expressed that she is coping by trying to focus on the present, writing, reaching out to members of her support system, and occupying her time with social events.  The counselor and patient explored the patient's coping resources and how they can be utilized between now and Monday. The counselor and patient also explored the way that the patient's cancer journey has affected her relationships. The counselor will follow up with the patient via phone call on September 24, as the patient reported this would be helpful.  Doris Cheadle, Counseling Intern [phone]

## 2018-04-05 ENCOUNTER — Ambulatory Visit
Admission: RE | Admit: 2018-04-05 | Discharge: 2018-04-05 | Disposition: A | Discharge status: Home / Self Care | Payer: Managed Care, Other (non HMO) | Source: Ambulatory Visit | Attending: Oncology | Admitting: Oncology

## 2018-04-05 ENCOUNTER — Other Ambulatory Visit: Payer: Self-pay | Admitting: Oncology

## 2018-04-05 DIAGNOSIS — R921 Mammographic calcification found on diagnostic imaging of breast: Secondary | ICD-10-CM

## 2018-04-05 DIAGNOSIS — Z1231 Encounter for screening mammogram for malignant neoplasm of breast: Secondary | ICD-10-CM

## 2018-04-05 DIAGNOSIS — N6489 Other specified disorders of breast: Secondary | ICD-10-CM

## 2018-04-06 ENCOUNTER — Encounter: Payer: Self-pay | Admitting: Family Medicine

## 2018-04-09 ENCOUNTER — Other Ambulatory Visit: Payer: Self-pay | Admitting: Oncology

## 2018-04-09 DIAGNOSIS — R921 Mammographic calcification found on diagnostic imaging of breast: Secondary | ICD-10-CM

## 2018-04-13 ENCOUNTER — Other Ambulatory Visit: Payer: Self-pay | Admitting: Family Medicine

## 2018-04-13 ENCOUNTER — Encounter: Payer: Self-pay | Admitting: Family Medicine

## 2018-04-13 NOTE — Progress Notes (Signed)
[facility]  [phone] [phone]     ID: [name] DOB: [birth year]  MR#: [id number]  JKK#:[id number]  Patient Care Team: [name], NP-C as PCP - General (Family Medicine) [name], MD as Consulting Physician (General Surgery) Magrinat, Virgie Dad, MD as Consulting Physician (Oncology) Eppie Gibson, MD as Attending Physician (Radiation Oncology) OTHER MD:  CHIEF COMPLAINT: Triple negative breast cancer; DVT  CURRENT TREATMENT: Observation   BREAST CANCER HISTORY: From the original intake note:  Peggy had her first ever mammogram 10/07/2016 at the [facility]. This showed a possible mass in the right breast. On 10/13/2016 she underwent bilateral diagnostic mammography with tomography and right breast ultrasonography. This found the breast density to be category B. In the upper inner quadrant of the right breast there was a circumscribed mass which was barely palpable. Ultrasonography confirmed a 0.9 cm right breast mass at the 1:00 radiant 18 cm from the nipple ultrasound of the axilla showed 1 indeterminate right axillary lymph node with borderline thickening of the anterior cortex.  On 10/16/2016 the patient underwent biopsy of the right breast mass and the suspicious right axillary lymph node. The right breast mass proved to be an invasive ductal carcinoma, grade 3, estrogen and progesterone receptor negative, with an MIB-1 of 80%, and no HER-2 amplification, the signals ratio being 1.30 and the number per cell 1.75. The lymph node was negative and concordant.  Her subsequent history is as detailed below  INTERVAL HISTORY: Anaily returns today for follow-up and treatment of her triple negative breast cancer, as well as her DVT. She continues under observation.   She discontinued rivaroxiban in mid February for financial reasons. She denies pain in the right arm. She is unaware of any swelling.   Her last A1C  was at 6.5.  She notes that she has managed to decreased her fasting range to near 100.    Since her last visit, she underwent diagnostic unilateral right mammography with CAD and tomography and right ultrasonography on 04/05/2018 at [facility] showing: breast density category B. There is a Cay[place] low-density nodule in the region of the patient's palpable lump surrounded by apparent scarring and fat necrosis. Ultrasound showed normal appearing lymph nodes in the right axilla. A 49-monthfollow up was recommended.    REVIEW OF SYSTEMS: AIvanahas been going to spiritual therapy, and she is also going [facility]. ODebbe Balesthis month. She plans on going back to work part time later this month. She would like to start out at 20-25 hours per week to make time to participate in the Finding Your New Normal program and Live Strong. For exercise, she is walking 4,000-5,000 steps per day along with 15 minute walks. She uses premarin creme one per week, which has helped with dyspareunia and ruptures. She follows up with Dr. RDenman Georgeon this. She denies unusual headaches, visual changes, nausea, vomiting, or dizziness. There has been no unusual cough, phlegm production, or pleurisy. There has been no change in bowel or bladder habits. She denies unexplained fatigue or unexplained weight loss, bleeding, rash, or fever. A detailed review of systems was otherwise stable.    PAST MEDICAL HISTORY: Past Medical History:  Diagnosis Date  . Abnormal Pap smear of cervix   . Anxiety   . Asthma   . Asymptomatic microscopic hematuria 09/29/2017  . 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  Concussion    around age 46  . Diabetes type 2, uncontrolled ([facility])    new diagnosis in 08/2016  . DVT (deep venous thrombosis) ([facility]) 11/24/2016   in rt arm  . Family history of breast cancer   . Family history of neurofibromatosis   . Family history of ovarian cancer   .  History of radiation therapy 04/21/17- 05/20/17   Right Breast 40.05 Gy in 15 fractions followed by a 10 Gy boost in 5 fractions to yield a total dose of 50.05 Gy  . HPV in female   . Hyperlipemia   . Neuromuscular disorder (HCC)    neuropathy  . Personal history of chemotherapy 2018  . Personal history of radiation therapy 2018  . Pneumonia    2014   . PTSD (post-traumatic stress disorder)     PAST SURGICAL HISTORY: Past Surgical History:  Procedure Laterality Date  . BREAST LUMPECTOMY Right 03/18/2017  . BREAST LUMPECTOMY WITH RADIOACTIVE SEED AND SENTINEL LYMPH NODE BIOPSY Right 03/18/2017   Procedure: RIGHT BREAST LUMPECTOMY WITH RADIOACTIVE SEED AND RIGHT SENTINEL LYMPH NODE BIOPSY;  Surgeon: [name], MD;  Location: [facility];  Service: General;  Laterality: Right;  . COLPOSCOPY    . left ovary removed  1978   was told her ovary was removed but Dr. Denman George found that was not the case. Suspect this was originally an ovarian cystectomy  . PORTACATH PLACEMENT Right 10/29/2016   Procedure: INSERTION PORT-A-CATH WITH [place];  Surgeon: [name], MD;  Location: [facility];  Service: General;  Laterality: Right;  . portacath removal     oct. 5 2018  . ROBOTIC ASSISTED TOTAL HYSTERECTOMY Bilateral 06/16/2017   Procedure: XI ROBOTIC ASSISTED TOTAL HYSTERECTOMY WITH BILATERAL SALPINGECTOMY,  OOPHERECTOMY;  Surgeon: [name], MD;  Location: [facility];  Service: Gynecology;  Laterality: Bilateral;    FAMILY HISTORY Family History  Problem Relation Age of Onset  . Breast cancer Mother 7  . Hepatitis C Father   . Ovarian cancer Maternal Aunt        dx in her 74s  . Neurofibromatosis Maternal Uncle   . Brain cancer Maternal Uncle 58  . Lung cancer Maternal Grandfather   . Kidney failure Paternal Grandmother   . Heart attack Paternal Grandfather   . Ovarian cancer Maternal Aunt        dx in her 29s  . Neurofibromatosis Maternal Aunt   . Ovarian cancer Maternal Aunt   .  Neurofibromatosis Maternal Aunt   . Cervical cancer Maternal Aunt   . Neurofibromatosis Maternal Aunt   . Cancer Maternal Aunt        cancer on the bottom of her foot  . Breast cancer Other        MGF's sisters  . Neurofibromatosis Other        MGM's paternal aunt  The patient's mother was diagnosed with breast cancer at age 101, but tells me the lump in her breast had been present for at least 10 years prior to that. She is now 77 and doing well. The patient's father died at the age of 64 from sepsis following liver transplantation. The patient has 2 brothers, no sisters. The patient tells me that she has at least 5 and sent cousins with breast cancer and there are other relatives with uterine cancer.  GYNECOLOGIC HISTORY:  Patient's last menstrual period was 01/10/2017. Menarche age 35, first live birth age 54, the patient is GX P1. She has had multiple  progesterone and oral contraceptive treatments through [facility] because of her menometrorrhagia. 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 Her husband [name] is a truck Geophysicist/field seismologist. The patient's daughter [name] is studying pre-veterinary medicine in college. [name] has 3 children, all boys,Akim is a cook in [place] and lives independently. The 2 younger boys, [name] and [name], aged 43 and 59 as of April 2018, are at home with the patient    ADVANCED DIRECTIVES: Not in place   HEALTH MAINTENANCE: Social History   Tobacco Use  . Smoking status: Former Smoker    Packs/day: 0.15    Years: 25.00    Pack years: 3.75    Types: Cigarettes    Last attempt to quit: 11/25/2016    Years since quitting: 1.3  . Smokeless tobacco: Never Used  Substance Use Topics  . Alcohol use: Yes    Alcohol/week: 1.0 standard drinks    Types: 1 Glasses of wine per week    Comment: rarely   . Drug use: No     Colonoscopy: n/a  PAP:  Bone density:   Allergies    Allergen Reactions  . Lisinopril     Cough, itchy throat  . Penicillins Other (See Comments)    As a child Has patient had a PCN reaction causing immediate rash, facial/tongue/throat swelling, SOB or lightheadedness with hypotension: unknown Has patient had a PCN reaction causing severe rash involving mucus membranes or skin necrosis: unknown Has patient had a PCN reaction that required hospitalization unknown Has patient had a PCN reaction occurring within the last 10 years: no If all of the above answers are "NO", then may proceed with Cephalosporin use.     Current Outpatient Medications  Medication Sig Dispense Refill  . albuterol (PROAIR HFA) 108 (90 Base) MCG/ACT inhaler Inhale 2 puffs into the lungs every 6 (six) hours as needed for wheezing or shortness of breath. 1 Inhaler 0  . atorvastatin (LIPITOR) 20 MG tablet TAKE 1 TABLET BY MOUTH ONCE DAILY 90 tablet 1  . BIOTIN PO Take 1 capsule by mouth daily.    . Dulaglutide (TRULICITY) 2.13 YQ/6.5HQ SOPN INJECT 1  SUBCUTANEOUSLY ONCE A WEEK 12 pen 1  . gabapentin (NEURONTIN) 300 MG capsule Take 1 capsule (300 mg total) by mouth at bedtime. 90 capsule 4  . Glucose Blood (BLOOD GLUCOSE TEST STRIPS) STRP Test twice a day. Pt uses one touch verio flex meter 100 each 5  . ibuprofen (ADVIL,MOTRIN) 200 MG tablet Take 400 mg by mouth daily as needed for headache or moderate pain.     . metFORMIN (GLUCOPHAGE XR) 750 MG 24 hr tablet Take 1 tablet (750 mg total) by mouth daily with breakfast. 30 tablet 3  . ONETOUCH DELICA LANCETS FINE MISC Test twice a day 100 each 5  . PREMARIN vaginal cream INSERT 1 APPLICATORFUL VAGINALLY AT BEDTIME  12  . venlafaxine XR (EFFEXOR-XR) 150 MG 24 hr capsule Take 1 capsule (150 mg total) by mouth daily with breakfast. 90 capsule 4   No current facility-administered medications for this visit.     OBJECTIVE: Young-appearing African-American woman in no acute distress  Vitals:   04/14/18 1013  BP: (!)  137/91  Pulse: 84  Resp: 18  Temp: 98.5 F (36.9 C)  SpO2: 95%     Body mass index is 43.6 kg/m.    ECOG FS:1 - Symptomatic but completely ambulatory  Sclerae unicteric, pupils round  and equal Oropharynx clear and moist No cervical or supraclavicular adenopathy Lungs no rales or rhonchi Heart regular rate and rhythm Abd soft, nontender, positive bowel sounds MSK no focal spinal tenderness, no right upper extremity lymphedema Neuro: nonfocal, well oriented, appropriate affect Breasts: Right breast has undergone lumpectomy and radiation.  There is palpable scar tissue associated with the incisions.  These are stable.  There is no evidence of local recurrence.  The left breast is benign.  Both axillae are benign.    LAB RESULTS:  CMP     Component Value Date/Time   NA 143 09/28/2017 1539   NA 140 05/07/2017 1221   K 4.6 09/28/2017 1539   K 4.0 05/07/2017 1221   CL 105 09/28/2017 1539   CO2 18 (L) 09/28/2017 1539   CO2 24 05/07/2017 1221   GLUCOSE 156 (H) 09/28/2017 1539   GLUCOSE 128 08/11/2017 1347   GLUCOSE 173 (H) 05/07/2017 1221   BUN 13 09/28/2017 1539   BUN 6.7 (L) 05/07/2017 1221   CREATININE 0.73 09/28/2017 1539   CREATININE 0.8 05/07/2017 1221   CALCIUM 9.6 09/28/2017 1539   CALCIUM 9.7 05/07/2017 1221   PROT 6.8 09/28/2017 1539   PROT 6.8 05/07/2017 1221   ALBUMIN 4.3 09/28/2017 1539   ALBUMIN 3.4 (L) 05/07/2017 1221   AST 21 09/28/2017 1539   AST 21 05/07/2017 1221   ALT 26 09/28/2017 1539   ALT 24 05/07/2017 1221   ALKPHOS 127 (H) 09/28/2017 1539   ALKPHOS 131 05/07/2017 1221   BILITOT 0.2 09/28/2017 1539   BILITOT 0.48 05/07/2017 1221   GFRNONAA 103 09/28/2017 1539   GFRAA 118 09/28/2017 1539    No results found for: TOTALPROTELP, ALBUMINELP, A1GS, A2GS, BETS, BETA2SER, GAMS, MSPIKE, SPEI  No results found for: Nils Pyle, [facility]  Lab Results  Component Value Date   WBC 5.1 04/14/2018   NEUTROABS 3.6 04/14/2018   HGB 13.7  04/14/2018   HCT 41.8 04/14/2018   MCV 84.1 04/14/2018   PLT 227 04/14/2018      Chemistry      Component Value Date/Time   NA 143 09/28/2017 1539   NA 140 05/07/2017 1221   K 4.6 09/28/2017 1539   K 4.0 05/07/2017 1221   CL 105 09/28/2017 1539   CO2 18 (L) 09/28/2017 1539   CO2 24 05/07/2017 1221   BUN 13 09/28/2017 1539   BUN 6.7 (L) 05/07/2017 1221   CREATININE 0.73 09/28/2017 1539   CREATININE 0.8 05/07/2017 1221      Component Value Date/Time   CALCIUM 9.6 09/28/2017 1539   CALCIUM 9.7 05/07/2017 1221   ALKPHOS 127 (H) 09/28/2017 1539   ALKPHOS 131 05/07/2017 1221   AST 21 09/28/2017 1539   AST 21 05/07/2017 1221   ALT 26 09/28/2017 1539   ALT 24 05/07/2017 1221   BILITOT 0.2 09/28/2017 1539   BILITOT 0.48 05/07/2017 1221       No results found for: LABCA2  No components found for: [id number]  No results for input(s): INR in the last 168 hours.  Urinalysis    Component Value Date/Time   COLORURINE YELLOW 10/29/2017 1532   APPEARANCEUR CLEAR 10/29/2017 1532   LABSPEC 1.015 01/12/2018 1100   PHURINE 5.0 10/29/2017 1532   GLUCOSEU 50 (A) 10/29/2017 1532   HGBUR MODERATE (A) 10/29/2017 1532   BILIRUBINUR negative 01/12/2018 1100   BILIRUBINUR n 09/29/2016 1045   KETONESUR negative 01/12/2018 1100   KETONESUR NEGATIVE 10/29/2017 1532   PROTEINUR negative 01/12/2018  [place] (A) 10/29/2017 1532   UROBILINOGEN negative 09/29/2016 1045   NITRITE Negative 01/12/2018 1100   NITRITE NEGATIVE 10/29/2017 1532   LEUKOCYTESUR Negative 01/12/2018 1100     STUDIES: US Breast Ltd Uni Right Inc Axilla  Result Date: 04/05/2018 CLINICAL DATA:  The patient had a Wernick mass in the medial right breast in 2018 which was triple negative breast cancer. She had calcifications in her right breast in March of 2019 which were deemed probably benign, likely fat necrosis. She presents for follow-up. She also has a lump at her right axillary resection site. EXAM:  DIGITAL DIAGNOSTIC RIGHT MAMMOGRAM WITH CAD AND TOMO ULTRASOUND RIGHT BREAST COMPARISON:  Previous exam(s). ACR Breast Density Category b: There are scattered areas of fibroglandular density. FINDINGS: There is a Fleischer low-density nodule in the region of the patient's palpable lump surrounded by apparent scarring and fat necrosis. This is at the patient's right axillary resection site. The calcifications in the medial right breast seen previously have coarsened, consistent with evolving fat necrosis. Similar appearing calcifications are seen in the right periareolar region anteriorly. These also have the appearance of fat necrosis. Mammographic images were processed with CAD. On physical exam, no suspicious lumps are identified. Targeted ultrasound is performed, showing normal lymph nodes the region of the patient's lump. There is a hypoechoic mass at 10 o'clock, 12 cm from the nipple which corresponds to the palpable lump. There appears to be a subtle fatty hilum, consistent with a K[place] lymph node. IMPRESSION: The patient appears to be feeling a Vivanco normal-size lymph node. The medial calcifications seen previously or a ball vein as expected, consistent with fat necrosis. The new calcifications anteriorly in the right breast have the same appearance as the fat necrosis and are also felt to likely represent fat necrosis. RECOMMENDATION: Recommend six-month follow-up mammography of the right breast to ensure stability of the probably benign calcifications. The patient is due for bilateral mammography at that time. I have discussed the findings and recommendations with the patient. Results were also provided in writing at the conclusion of the visit. If applicable, a reminder letter will be sent to the patient regarding the next appointment. BI-RADS CATEGORY  3: Probably benign. Electronically Signed   By: Dorise Bullion III M.D   On: 04/05/2018 11:14   Mm Diag Breast Tomo Uni Right  Result Date:  04/05/2018 CLINICAL DATA:  The patient had a Mara mass in the medial right breast in 2018 which was triple negative breast cancer. She had calcifications in her right breast in March of 2019 which were deemed probably benign, likely fat necrosis. She presents for follow-up. She also has a lump at her right axillary resection site. EXAM: DIGITAL DIAGNOSTIC RIGHT MAMMOGRAM WITH CAD AND TOMO ULTRASOUND RIGHT BREAST COMPARISON:  Previous exam(s). ACR Breast Density Category b: There are scattered areas of fibroglandular density. FINDINGS: There is a Song low-density nodule in the region of the patient's palpable lump surrounded by apparent scarring and fat necrosis. This is at the patient's right axillary resection site. The calcifications in the medial right breast seen previously have coarsened, consistent with evolving fat necrosis. Similar appearing calcifications are seen in the right periareolar region anteriorly. These also have the appearance of fat necrosis. Mammographic images were processed with CAD. On physical exam, no suspicious lumps are identified. Targeted ultrasound is performed, showing normal lymph nodes the region of the patient's lump. There is a hypoechoic mass at 10 o'clock, 12 cm from the  nipple which corresponds to the palpable lump. There appears to be a subtle fatty hilum, consistent with a Tabares lymph node. IMPRESSION: The patient appears to be feeling a O[place]endo normal-size lymph node. The medial calcifications seen previously or a ball vein as expected, consistent with fat necrosis. The new calcifications anteriorly in the right breast have the same appearance as the fat necrosis and are also felt to likely represent fat necrosis. RECOMMENDATION: Recommend six-month follow-up mammography of the right breast to ensure stability of the probably benign calcifications. The patient is due for bilateral mammography at that time. I have discussed the findings and recommendations with the patient.  Results were also provided in writing at the conclusion of the visit. If applicable, a reminder letter will be sent to the patient regarding the next appointment. BI-RADS CATEGORY  3: Probably benign. Electronically Signed   By: Dorise Bullion III M.D   On: 04/05/2018 11:14    Since her last visit, she underwent diagnostic bilateral mammography with CAD and tomography and right ultrasonography on 10/07/2017 at [facility] showing: breast density category B. There were probably benign right breast calcifications near the lumpectomy site favored to be fat necrosis. There was a probably benign oval mass in the upper-outer right breast. A 67-monthrepeat was reccomended.   ELIGIBLE FOR AVAILABLE RESEARCH PROTOCOL: not a candidate for PREVENT  ASSESSMENT: 42y.o.  [place] woman status post right breast upper inner quadrant biopsy 10/16/2016 for a clinical T1b pN0, stage IB invasive ductal carcinoma, grade 3, triple negative, with an MIB-1 of 80%.  (1)  genetics testing 12/01/2016 showed several variants of uncertain significance but no deleterious mutation  (2) neoadjuvant chemotherapy consisting of doxorubicin and cyclophosphamide in dose dense fashion 4 starting 11/05/2016, received third cycle 12/03/2016 then proceeded to weekly Abraxane starting 01/01/2017  (a) cycle 4 of cyclophosphamide and doxorubicin given at end of Abraxane on 02/12/2017  (b) Abraxane given from 01/01/17-01/22/17 (4 cycles), stopped early due to peripheral neuropathy  (3) status post right lumpectomy and sentinel lymph node sampling 03/18/2017 for a ypT1b ypN0 invasive ductal carcinoma, grade 3, with negative margins, and repeat prognostic panel again triple negative  (4) adjuvant radiation completed 05/20/2017 Site/dose:  The right breast was treated to 40.05 Gy in 15 fractions, followed by a 10 Gy boost in 5 fractions to yield a total dose of 50.05 Gy  (5) extensive right upper extremity deep venous thrombosis  documented 11/23/2016, treated initially with Lovenox  (a) transitioned to rivaroxaban as of 11/30/2016  (b) total 6 months anticoagulation planned (one month beyond port removal)  (c) d-dimer 08/11/2017 normal  (d) rivaroxaban discontinued late January 2019 for financial reasons  (6) tobacco abuse: The patient quit smoking 11/26/2016  (7) menometrorrhagia, with a cystic right adnexal lesion and a possible cervical polyp noted on ultrasound 12/16/2016, with benign endocervical curettage and endometrial biopsy 12/16/2016  (a) CA 125 12/17/2016 was 14.2 (normal).  (b) adnexal mass, likely benign, will be explored at the completion of chemotherapy  (c) goserelin started 12/16/2016, continued every 28 days, last dose 06/05/2017  (d) status post hysterectomy and bilateral salpingo-oophorectomy 06/16/2017 with benign pathology  (8) iron deficiency anemia secondary to menometrorrhagia, status post Feraheme 05/20/2017 and 06/01/2017  (9) likely thalassemia trait  (10) post traumatic stress disorder  (a) venlafaxine increased to 250 mg daily as of 02/25/2018  (b) referred for counseling    PLAN: AArriahis now a little over a year out from definitive surgery for her breast  cancer with no evidence of disease recurrence.  This is favorable.  She was very concerned because of the Musson calcification noted near the nipple on the right.  We discussed breast calcifications in detail.  She has already been scheduled for a six-month follow-up in April.  Psychologically and spiritually she is doing much better.  She will be seeing a Social worker at [place], [place], seeing counselor through a masters of social work Ship broker, Systems analyst, downstairs.  Marcelene is also planning to participate in [place].  She is hoping to be able to get back to work part-time sometime before the end of this month.  I think work actually may be helpful to her in terms of providing structure as well as of course increasing  financial support.  I also encouraged her to exercise and greatly supported her plan to do the live strong program  She has a history of singing and does have an excellent voice.  I think if she took some voice lessons and got hooked up with Johann Capers and other singing groups in town that also will be a great stress reliever for her.  She is already scheduled to see me again in January.  She knows to call for any other issues that may develop before that visit.    Magrinat, Virgie Dad, MD  04/14/18 10:29 AM Medical Oncology and Hematology [facility] [address] [place], [place] [address] Tel. [phone]    Fax. [phone]  Alice Rieger, am acting as scribe for Chauncey Cruel MD.  I, Lurline Del MD, have reviewed the above documentation for accuracy and completeness, and I agree with the above.

## 2018-04-14 ENCOUNTER — Telehealth: Payer: Self-pay | Admitting: Oncology

## 2018-04-14 ENCOUNTER — Inpatient Hospital Stay: Payer: Managed Care, Other (non HMO) | Attending: Oncology

## 2018-04-14 ENCOUNTER — Inpatient Hospital Stay (HOSPITAL_BASED_OUTPATIENT_CLINIC_OR_DEPARTMENT_OTHER): Payer: Managed Care, Other (non HMO) | Admitting: Oncology

## 2018-04-14 ENCOUNTER — Ambulatory Visit (INDEPENDENT_AMBULATORY_CARE_PROVIDER_SITE_OTHER): Payer: 59 | Admitting: Psychology

## 2018-04-14 VITALS — BP 137/91 | HR 84 | Temp 98.5°F | Resp 18 | Ht 73.0 in | Wt 330.5 lb

## 2018-04-14 DIAGNOSIS — Z87891 Personal history of nicotine dependence: Secondary | ICD-10-CM | POA: Diagnosis not present

## 2018-04-14 DIAGNOSIS — F4310 Post-traumatic stress disorder, unspecified: Secondary | ICD-10-CM

## 2018-04-14 DIAGNOSIS — Z86718 Personal history of other venous thrombosis and embolism: Secondary | ICD-10-CM | POA: Insufficient documentation

## 2018-04-14 DIAGNOSIS — C50211 Malignant neoplasm of upper-inner quadrant of right female breast: Secondary | ICD-10-CM

## 2018-04-14 DIAGNOSIS — Z171 Estrogen receptor negative status [ER-]: Secondary | ICD-10-CM | POA: Insufficient documentation

## 2018-04-14 DIAGNOSIS — F331 Major depressive disorder, recurrent, moderate: Secondary | ICD-10-CM

## 2018-04-14 LAB — CBC WITH DIFFERENTIAL/PLATELET
Basophils Absolute: 0 10*3/uL (ref 0.0–0.1)
Basophils Relative: 0 %
Eosinophils Absolute: 0.1 10*3/uL (ref 0.0–0.5)
Eosinophils Relative: 2 %
HCT: 41.8 % (ref 34.8–46.6)
Hemoglobin: 13.7 g/dL (ref 11.6–15.9)
Lymphocytes Relative: 24 %
Lymphs Abs: 1.2 10*3/uL (ref 0.9–3.3)
MCH: 27.6 pg (ref 25.1–34.0)
MCHC: 32.8 g/dL (ref 31.5–36.0)
MCV: 84.1 fL (ref 79.5–101.0)
Monocytes Absolute: 0.2 10*3/uL (ref 0.1–0.9)
Monocytes Relative: 4 %
Neutro Abs: 3.6 10*3/uL (ref 1.5–6.5)
Neutrophils Relative %: 70 %
Platelets: 227 10*3/uL (ref 145–400)
RBC: 4.97 MIL/uL (ref 3.70–5.45)
RDW: 13.9 % (ref 11.2–14.5)
WBC: 5.1 10*3/uL (ref 3.9–10.3)

## 2018-04-14 LAB — COMPREHENSIVE METABOLIC PANEL
ALT: 27 U/L (ref 0–44)
AST: 16 U/L (ref 15–41)
Albumin: 3.6 g/dL (ref 3.5–5.0)
Alkaline Phosphatase: 112 U/L (ref 38–126)
Anion gap: 9 (ref 5–15)
BUN: 13 mg/dL (ref 6–20)
CO2: 26 mmol/L (ref 22–32)
Calcium: 9.6 mg/dL (ref 8.9–10.3)
Chloride: 101 mmol/L (ref 98–111)
Creatinine, Ser: 0.85 mg/dL (ref 0.44–1.00)
GFR calc Af Amer: >60 mL/min (ref >60)
GFR calc non Af Amer: >60 mL/min (ref >60)
Glucose, Bld: 205 mg/dL — ABNORMAL HIGH (ref 70–99)
Potassium: 4.3 mmol/L (ref 3.5–5.1)
Sodium: 136 mmol/L (ref 135–145)
Total Bilirubin: 0.4 mg/dL (ref 0.3–1.2)
Total Protein: 7.2 g/dL (ref 6.5–8.1)

## 2018-04-14 NOTE — Telephone Encounter (Signed)
Per 10/2 los, no new orders or referrals.

## 2018-04-15 ENCOUNTER — Ambulatory Visit (INDEPENDENT_AMBULATORY_CARE_PROVIDER_SITE_OTHER): Payer: Managed Care, Other (non HMO) | Admitting: Emergency Medicine

## 2018-04-15 ENCOUNTER — Encounter: Payer: Self-pay | Admitting: Emergency Medicine

## 2018-04-15 DIAGNOSIS — R0683 Snoring: Secondary | ICD-10-CM

## 2018-04-15 DIAGNOSIS — J4520 Mild intermittent asthma, uncomplicated: Secondary | ICD-10-CM | POA: Diagnosis not present

## 2018-04-15 DIAGNOSIS — J45909 Unspecified asthma, uncomplicated: Secondary | ICD-10-CM

## 2018-04-15 DIAGNOSIS — Z23 Encounter for immunization: Secondary | ICD-10-CM

## 2018-04-15 LAB — PULMONARY FUNCTION TEST
DL/VA % pred: 104 %
DL/VA: 5.85 ml/min/mmHg/L
DLCO cor % pred: 81 %
DLCO cor: 28.58 ml/min/mmHg
DLCO unc % pred: 82 %
DLCO unc: 28.84 ml/min/mmHg
FEF 25-75 Post: 2.3 L/sec
FEF 25-75 Pre: 2.57 L/sec
FEF2575-%Change-Post: -10 %
FEF2575-%Pred-Post: 64 %
FEF2575-%Pred-Pre: 72 %
FEV1-%Change-Post: -5 %
FEV1-%Pred-Post: 72 %
FEV1-%Pred-Pre: 76 %
FEV1-Post: 2.73 L
FEV1-Pre: 2.88 L
FEV1FVC-%Change-Post: -1 %
FEV1FVC-%Pred-Pre: 95 %
FEV6-%Change-Post: -3 %
FEV6-%Pred-Post: 76 %
FEV6-%Pred-Pre: 79 %
FEV6-Post: 3.53 L
FEV6-Pre: 3.66 L
FEV6FVC-%Change-Post: 0 %
FEV6FVC-%Pred-Post: 102 %
FEV6FVC-%Pred-Pre: 101 %
FVC-%Change-Post: -3 %
FVC-%Pred-Post: 74 %
FVC-%Pred-Pre: 77 %
FVC-Post: 3.53 L
FVC-Pre: 3.66 L
Post FEV1/FVC ratio: 78 %
Post FEV6/FVC ratio: 100 %
Pre FEV1/FVC ratio: 79 %
Pre FEV6/FVC Ratio: 100 %
RV % pred: 36 %
RV: 0.73 L
TLC % pred: 69 %
TLC: 4.33 L

## 2018-04-15 NOTE — Progress Notes (Signed)
Patient completed full PFT. 

## 2018-04-15 NOTE — Progress Notes (Signed)
Subjective:    Patient ID: Kelly Mendez, female    DOB: 22-Sep-1975, 42 y.o.   MRN: [id number]  HPI 42 year old former smoker (5 pack years) with a history of breast cancer (lumpectomy, followed by Dr. Jana Hakim), right upper extremity DVT, diabetes, probably childhood asthma. She had BD in middle school and then had a respite. Then developed sx in the mid 2000's, had PFT, was started on Advair + albuterol, then was able to come off the Advair for a long while. She has long periods of stability. She began to have dyspnea with exertion, chest pressure, some wheeze w exertion in late July.  She got some albuterol soon after from her PCP and had spiro below. She was given asmanex on 8/7, taking bid. Unsure whether it was helpful. Her sx have improved some, not using albuterol frequently  Spirometry done 02/17/2018 was reviewed by me, shows possible mixed restriction with obstruction.  ROV 04/15/18 --Ms. Childress returns today for further evaluation of her intermittent episodic dyspnea.  She has a history of breast cancer, right upper extremity DVT, suspected childhood asthma.  She is being treated for presumed adult asthma as well.  I saw her about 1 month ago and at that time we stopped Asmanex, question whether may have been some upper airway component to her symptoms.  We performed pulmonary function testing today which I have reviewed the spirometry is consistent with principally restriction although there may be a [place] amount of superimposed obstruction.  There is no bronchodilator response.  Her lung volumes are restricted and her diffusion capacity is normal. She has been pretreating exercise with albuterol. Has noticed that it does help her exercise tolerance. She questions whether some of her sx may be associated with her anxiety. She notes that she is having a lot of snoring, some witnessed apneas. She has daytime sleepiness. She has never had a PSG. She does have some allergies.     Review of Systems  Constitutional: Negative for fever and unexpected weight change.  HENT: Positive for congestion, dental problem, postnasal drip, sneezing and sore throat. Negative for nosebleeds, rhinorrhea, sinus pressure and trouble swallowing.   Eyes: Negative for redness and itching.  Respiratory: Positive for cough, chest tightness, shortness of breath and wheezing.   Cardiovascular: Positive for palpitations. Negative for leg swelling.  Gastrointestinal: Negative for nausea and vomiting.  Genitourinary: Negative for dysuria.  Musculoskeletal: Negative for joint swelling.  Skin: Negative for rash.  Allergic/Immunologic: Negative.  Negative for environmental allergies, food allergies and immunocompromised state.  Neurological: Positive for headaches.  Hematological: Bruises/bleeds easily.  Psychiatric/Behavioral: Positive for dysphoric mood. The patient is nervous/anxious.    Past Medical History:  Diagnosis Date  . Abnormal Pap smear of cervix   . Anxiety   . Asthma   . Asymptomatic microscopic hematuria 09/29/2017  . 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 Concussion    around age 57  . Diabetes type 2, uncontrolled ([place])    new diagnosis in 08/2016  . DVT (deep venous thrombosis) ([facility]) 11/24/2016   in rt arm  . Family history of breast cancer   . Family history of neurofibromatosis   . Family history of ovarian cancer   . History of radiation therapy 04/21/17- 05/20/17   Right Breast 40.05 Gy in 15 fractions followed by a 10 Gy boost in 5 fractions to yield a total dose of 50.05 Gy  . HPV in female   .  Hyperlipemia   . Neuromuscular disorder (HCC)    neuropathy  . Personal history of chemotherapy 2018  . Personal history of radiation therapy 2018  . Pneumonia    2014   . PTSD (post-traumatic stress disorder)      Family History  Problem Relation Age of Onset  . Breast cancer Mother 83  . Hepatitis C Father   . Ovarian cancer  Maternal Aunt        dx in her 66s  . Neurofibromatosis Maternal Uncle   . Brain cancer Maternal Uncle 58  . Lung cancer Maternal Grandfather   . Kidney failure Paternal Grandmother   . Heart attack Paternal Grandfather   . Ovarian cancer Maternal Aunt        dx in her 66s  . Neurofibromatosis Maternal Aunt   . Ovarian cancer Maternal Aunt   . Neurofibromatosis Maternal Aunt   . Cervical cancer Maternal Aunt   . Neurofibromatosis Maternal Aunt   . Cancer Maternal Aunt        cancer on the bottom of her foot  . Breast cancer Other        MGF's sisters  . Neurofibromatosis Other        MGM's paternal aunt     Social History   Socioeconomic History  . Marital status: Married    Spouse name: Not on file  . Number of children: Not on file  . Years of education: Not on file  . Highest education level: Not on file  Occupational History  . Not on file  Social Needs  . Financial resource strain: Not on file  . Food insecurity:    Worry: Not on file    Inability: Not on file  . Transportation needs:    Medical: Not on file    Non-medical: Not on file  Tobacco Use  . Smoking status: Former Smoker    Packs/day: 0.15    Years: 25.00    Pack years: 3.75    Types: Cigarettes    Last attempt to quit: 11/25/2016    Years since quitting: 1.3  . Smokeless tobacco: Never Used  Substance and Sexual Activity  . Alcohol use: Yes    Alcohol/week: 1.0 standard drinks    Types: 1 Glasses of wine per week    Comment: rarely   . Drug use: No  . Sexual activity: Not Currently    Birth control/protection: Surgical    Comment: husband vasectomy  Lifestyle  . Physical activity:    Days per week: Not on file    Minutes per session: Not on file  . Stress: Not on file  Relationships  . Social connections:    Talks on phone: Not on file    Gets together: Not on file    Attends religious service: Not on file    Active member of club or organization: Not on file    Attends meetings of  clubs or organizations: Not on file    Relationship status: Not on file  . Intimate partner violence:    Fear of current or ex partner: Not on file    Emotionally abused: Not on file    Physically abused: Not on file    Forced sexual activity: Not on file  Other Topics Concern  . Not on file  Social History Narrative  . Not on file  She has lived in [place], [name], [place].  She has worked Therapist, art, a lot of talking.  [employer] [place] [employer], ?  Asbestos exposure.  No water damage  3-4 cig a day teens to age 10   Allergies  Allergen Reactions  . Lisinopril     Cough, itchy throat  . Penicillins Other (See Comments)    As a child Has patient had a PCN reaction causing immediate rash, facial/tongue/throat swelling, SOB or lightheadedness with hypotension: unknown Has patient had a PCN reaction causing severe rash involving mucus membranes or skin necrosis: unknown Has patient had a PCN reaction that required hospitalization unknown Has patient had a PCN reaction occurring within the last 10 years: no If all of the above answers are "NO", then may proceed with Cephalosporin use.      Outpatient Medications Prior to Visit  Medication Sig Dispense Refill  . albuterol (PROAIR HFA) 108 (90 Base) MCG/ACT inhaler Inhale 2 puffs into the lungs every 6 (six) hours as needed for wheezing or shortness of breath. 1 Inhaler 0  . atorvastatin (LIPITOR) 20 MG tablet TAKE 1 TABLET BY MOUTH ONCE DAILY 90 tablet 1  . BIOTIN PO Take 1 capsule by mouth daily.    . Dulaglutide (TRULICITY) 6.38 VF/6.4PP SOPN INJECT 1  SUBCUTANEOUSLY ONCE A WEEK 12 pen 1  . gabapentin (NEURONTIN) 300 MG capsule Take 1 capsule (300 mg total) by mouth at bedtime. 90 capsule 4  . Glucose Blood (BLOOD GLUCOSE TEST STRIPS) STRP Test twice a day. Pt uses one touch verio flex meter 100 each 5  . ibuprofen (ADVIL,MOTRIN) 200 MG tablet Take 400 mg by mouth daily as needed for headache or moderate pain.     . metFORMIN  (GLUCOPHAGE XR) 750 MG 24 hr tablet Take 1 tablet (750 mg total) by mouth daily with breakfast. 30 tablet 3  . ONETOUCH DELICA LANCETS FINE MISC Test twice a day 100 each 5  . PREMARIN vaginal cream INSERT 1 APPLICATORFUL VAGINALLY AT BEDTIME  12  . venlafaxine XR (EFFEXOR-XR) 150 MG 24 hr capsule Take 1 capsule (150 mg total) by mouth daily with breakfast. 90 capsule 4   No facility-administered medications prior to visit.         Objective:   Physical Exam Vitals:   04/15/18 1056  BP: 124/82  Pulse: 74  SpO2: 98%  Weight: (!) 329 lb (149.2 kg)  Height: 6' (1.829 m)   Gen: Pleasant, overweight woman, in no distress,  normal affect  ENT: No lesions,  mouth clear,  oropharynx clear, no postnasal drip  Neck: No JVD, no stridor  Lungs: No use of accessory muscles, clear bilaterally with no wheezing or crackles  Cardiovascular: RRR, heart sounds normal, no murmur or gallops, no peripheral edema  Musculoskeletal: No deformities, no cyanosis or clubbing  Neuro: alert, non focal  Skin: Warm, no lesions or rash      Assessment & Plan:  Mild intermittent asthma No evidence of significant obstruction on her spirometry today, possibly some very mild mixed disease.  Mainly restriction.  Discussed this with her.  Her flare this summer appears to be improved, she is using her albuterol before exercise, rarely otherwise.  I do not think we need to restart controller medication.  She will keep track of her albuterol use frequency.  Potential contributors to her symptoms include allergies although not active at this time.  She denies any GERD.  Snoring She has daytime sleepiness, snoring, witnessed apneas.  Suspect that she does have obstructive sleep apnea.  She needs a split-night sleep study.  We will perform and then review the results, consider  CPAP initiation.  Baltazar Apo, MD, PhD 04/15/2018, 11:21 AM [facility] [phone] or if no answer [phone]

## 2018-04-15 NOTE — Patient Instructions (Addendum)
Please keep your albuterol available to use 2 puffs if needed for shortness of breath, chest tightness, wheezing.  You may also want to pretreat your exercise with 2 puffs about 15 minutes prior to walking or exertion.  Keep track of how often you use this. We will not restart Asmanex at this time. We may decide to start medication for allergic rhinitis at some point in the future if symptoms return. We will perform a split-night sleep study Get the flu shot this fall if you have not already. Your pneumonia shot is up-to-date. Follow with Dr Lamonte Sakai in 2 months to review sleep data

## 2018-04-15 NOTE — Addendum Note (Signed)
Addended by: Vivia Ewing on: 04/15/2018 11:23 AM   Modules accepted: Orders

## 2018-04-15 NOTE — Assessment & Plan Note (Signed)
She has daytime sleepiness, snoring, witnessed apneas.  Suspect that she does have obstructive sleep apnea.  She needs a split-night sleep study.  We will perform and then review the results, consider CPAP initiation.

## 2018-04-15 NOTE — Assessment & Plan Note (Signed)
No evidence of significant obstruction on her spirometry today, possibly some very mild mixed disease.  Mainly restriction.  Discussed this with her.  Her flare this summer appears to be improved, she is using her albuterol before exercise, rarely otherwise.  I do not think we need to restart controller medication.  She will keep track of her albuterol use frequency.  Potential contributors to her symptoms include allergies although not active at this time.  She denies any GERD.

## 2018-04-16 ENCOUNTER — Telehealth: Payer: Self-pay | Admitting: *Deleted

## 2018-04-19 NOTE — Progress Notes (Signed)
Additional disability forms and office notes successfully faxed to [employer] at [phone].

## 2018-04-20 NOTE — Progress Notes (Signed)
FMLA successfully faxed to [phone]. Mailed copy to patient address on file.

## 2018-04-26 ENCOUNTER — Telehealth: Payer: Self-pay | Admitting: Family Medicine

## 2018-04-26 DIAGNOSIS — E118 Type 2 diabetes mellitus with unspecified complications: Secondary | ICD-10-CM

## 2018-04-26 MED ORDER — METFORMIN HCL ER 750 MG PO TB24: 750.0000 mg | ORAL_TABLET | Freq: Every day | ORAL | 3 refills | Status: DC

## 2018-04-26 NOTE — Telephone Encounter (Signed)
done

## 2018-04-26 NOTE — Telephone Encounter (Signed)
   Fax refill request from [facility]  Metformin ER 750 mg #30

## 2018-05-06 ENCOUNTER — Encounter: Payer: Self-pay | Admitting: *Deleted

## 2018-05-07 ENCOUNTER — Ambulatory Visit (INDEPENDENT_AMBULATORY_CARE_PROVIDER_SITE_OTHER): Payer: 59 | Admitting: Psychology

## 2018-05-07 ENCOUNTER — Encounter: Payer: Self-pay | Admitting: *Deleted

## 2018-05-07 DIAGNOSIS — F331 Major depressive disorder, recurrent, moderate: Secondary | ICD-10-CM

## 2018-05-17 ENCOUNTER — Encounter: Payer: Self-pay | Admitting: Oncology

## 2018-05-17 ENCOUNTER — Other Ambulatory Visit: Payer: Self-pay | Admitting: Oncology

## 2018-05-19 ENCOUNTER — Ambulatory Visit (HOSPITAL_BASED_OUTPATIENT_CLINIC_OR_DEPARTMENT_OTHER): Payer: Managed Care, Other (non HMO) | Attending: Emergency Medicine | Admitting: Pulmonary Disease

## 2018-05-19 VITALS — Ht 73.0 in | Wt 323.0 lb

## 2018-05-19 DIAGNOSIS — I493 Ventricular premature depolarization: Secondary | ICD-10-CM | POA: Diagnosis not present

## 2018-05-19 DIAGNOSIS — Z6841 Body Mass Index (BMI) 40.0 and over, adult: Secondary | ICD-10-CM | POA: Insufficient documentation

## 2018-05-19 DIAGNOSIS — G478 Other sleep disorders: Secondary | ICD-10-CM | POA: Insufficient documentation

## 2018-05-19 DIAGNOSIS — R0683 Snoring: Secondary | ICD-10-CM | POA: Diagnosis not present

## 2018-05-19 DIAGNOSIS — R5383 Other fatigue: Secondary | ICD-10-CM | POA: Diagnosis not present

## 2018-05-19 DIAGNOSIS — R51 Headache: Secondary | ICD-10-CM | POA: Insufficient documentation

## 2018-05-19 DIAGNOSIS — E119 Type 2 diabetes mellitus without complications: Secondary | ICD-10-CM | POA: Insufficient documentation

## 2018-05-19 DIAGNOSIS — E669 Obesity, unspecified: Secondary | ICD-10-CM | POA: Diagnosis not present

## 2018-05-21 ENCOUNTER — Ambulatory Visit (INDEPENDENT_AMBULATORY_CARE_PROVIDER_SITE_OTHER): Payer: 59 | Admitting: Psychology

## 2018-05-21 DIAGNOSIS — F331 Major depressive disorder, recurrent, moderate: Secondary | ICD-10-CM

## 2018-05-24 DIAGNOSIS — G4733 Obstructive sleep apnea (adult) (pediatric): Secondary | ICD-10-CM

## 2018-05-24 NOTE — Procedures (Signed)
  Patient Name: Kelly Mendez, Kelly Mendez Date: 05/19/2018   Gender: Female  D.O.B: 06/23/1976  Age (years): 41  Referring Provider: Baltazar Apo  Height (inches): 99  Interpreting Physician: Kara Mead MD, ABSM  Weight (lbs): 323  RPSGT: Laren Everts  BMI: 43  MRN: [id number]  Neck Size: 16.00  <br> <br>  CLINICAL INFORMATION  Sleep Study Type: NPSG Indication for sleep study: Diabetes, Fatigue, Morning Headaches, Obesity, Snoring, Witnessed Apneas Epworth Sleepiness Score: 15 SLEEP STUDY TECHNIQUE  As per the AASM Manual for the Scoring of Sleep and Associated Events v2.3 (April 2016) with a hypopnea requiring 4% desaturations. The channels recorded and monitored were frontal, central and occipital EEG, electrooculogram (EOG), submentalis EMG (chin), nasal and oral airflow, thoracic and abdominal wall motion, anterior tibialis EMG, snore microphone, electrocardiogram, and pulse oximetry. MEDICATIONS  Medications self-administered by patient taken the night of the study : N/A SLEEP ARCHITECTURE  The study was initiated at 10:19:57 PM and ended at 4:50:32 AM. Sleep onset time was 27.5 minutes and the sleep efficiency was 77.2%%. The total sleep time was 301.5 minutes. Stage REM latency was 185.0 minutes. The patient spent 20.6%% of the night in stage N1 sleep, 66.0%% in stage N2 sleep, 0.0%% in stage N3 and 13.4% in REM. Alpha intrusion was absent. Supine sleep was 46.77%. RESPIRATORY PARAMETERS  The overall apnea/hypopnea index (AHI) was 2.6 per hour. There were 2 total apneas, including 2 obstructive, 0 central and 0 mixed apneas. There were 11 hypopneas and 31 RERAs. The AHI during Stage REM sleep was 5.9 per hour. AHI while supine was 3.0 per hour. The mean oxygen saturation was 94.0%. The minimum SpO2 during sleep was 88.0%. moderate snoring was noted during this study. CARDIAC DATA  The 2 lead EKG demonstrated sinus rhythm. The mean heart rate was 87.7 beats per minute.  Other EKG findings include: PVCs.  LEG MOVEMENT DATA  The total PLMS were 0 with a resulting PLMS index of 0.0. Associated arousal with leg movement index was 0.2 . IMPRESSIONS  - No significant obstructive sleep apnea occurred during this study (AHI = 2.6/h). Few RERAs were noted making RDI 8/h predominantly in supine position - No significant central sleep apnea occurred during this study (CAI = 0.0/h).  - The patient had minimal or no oxygen desaturation during the study (Min O2 = 88.0%)  - The patient snored with moderate snoring volume.  - EKG findings include PVCs.  - Clinically significant periodic limb movements did not occur during sleep. No significant associated arousals. DIAGNOSIS  Upper airway resistance syndrome RECOMMENDATIONS  - The cardiovascular significance of this degree of sleep disordered breathing is debatable. Treatment options can include weight loss & positional therapy alone .  Avoid alcohol, sedatives and other CNS depressants that may worsen sleep apnea and disrupt normal sleep architecture. - Sleep hygiene should be reviewed to assess factors that may improve sleep quality.  - Weight management and regular exercise should be initiated or continued if appropriate.   Kara Mead MD Board Certified in [place]

## 2018-05-25 ENCOUNTER — Telehealth: Payer: Self-pay | Admitting: Internal Medicine

## 2018-05-25 NOTE — Progress Notes (Signed)
Her sleep study did not show sleep apnea so this is good. We can have her come in to discuss good sleep hygiene and weight loss which will help with her sleep if she would like.

## 2018-05-25 NOTE — Telephone Encounter (Signed)
-----   Message from [name], NP-C sent at 05/25/2018  8:47 AM EST -----   ----- Message ----- From: Rigoberto Noel, MD Sent: 05/24/2018   2:58 PM EST To: Collene Gobble, MD, [name], NP-C

## 2018-06-04 ENCOUNTER — Ambulatory Visit (INDEPENDENT_AMBULATORY_CARE_PROVIDER_SITE_OTHER): Payer: 59 | Admitting: Psychology

## 2018-06-04 DIAGNOSIS — F331 Major depressive disorder, recurrent, moderate: Secondary | ICD-10-CM | POA: Diagnosis not present

## 2018-06-15 ENCOUNTER — Ambulatory Visit: Payer: Self-pay | Admitting: Emergency Medicine

## 2018-06-25 ENCOUNTER — Ambulatory Visit (INDEPENDENT_AMBULATORY_CARE_PROVIDER_SITE_OTHER): Payer: 59 | Admitting: Psychology

## 2018-06-25 DIAGNOSIS — F331 Major depressive disorder, recurrent, moderate: Secondary | ICD-10-CM | POA: Diagnosis not present

## 2018-07-09 ENCOUNTER — Ambulatory Visit (INDEPENDENT_AMBULATORY_CARE_PROVIDER_SITE_OTHER): Payer: 59 | Admitting: Psychology

## 2018-07-09 DIAGNOSIS — F331 Major depressive disorder, recurrent, moderate: Secondary | ICD-10-CM | POA: Diagnosis not present

## 2018-07-10 ENCOUNTER — Other Ambulatory Visit: Payer: Self-pay | Admitting: Family Medicine

## 2018-07-10 DIAGNOSIS — E118 Type 2 diabetes mellitus with unspecified complications: Principal | ICD-10-CM

## 2018-07-10 DIAGNOSIS — E1165 Type 2 diabetes mellitus with hyperglycemia: Secondary | ICD-10-CM

## 2018-07-10 DIAGNOSIS — IMO0002 Reserved for concepts with insufficient information to code with codable children: Secondary | ICD-10-CM

## 2018-07-23 ENCOUNTER — Ambulatory Visit: Payer: 59 | Admitting: Psychology

## 2018-08-10 NOTE — Progress Notes (Signed)
[name]  [phone] [phone]) [phone]     ID: Kelly Mendez DOB: October 03, 1975  MR#: [id number]  SHF#:[id number]  Patient Care Team: [name], NP-C as PCP - General (Family Medicine) [name], MD as Consulting Physician (General Surgery) Magrinat, Virgie Dad, MD as Consulting Physician (Oncology) Eppie Gibson, MD as Attending Physician (Radiation Oncology) Buena Irish, LCSW as Social Worker (Licensed Clinical Social Worker) [name], MD as Consulting Physician (Gynecologic Oncology) OTHER MD:   CHIEF COMPLAINT: Triple negative breast cancer; DVT  CURRENT TREATMENT: Observation   BREAST CANCER HISTORY: From the original intake note:  Nilaya had her first ever mammogram 10/07/2016 at the [facility]. This showed a possible mass in the right breast. On 10/13/2016 she underwent bilateral diagnostic mammography with tomography and right breast ultrasonography. This found the breast density to be category B. In the upper inner quadrant of the right breast there was a circumscribed mass which was barely palpable. Ultrasonography confirmed a 0.9 cm right breast mass at the 1:00 radiant 18 cm from the nipple ultrasound of the axilla showed 1 indeterminate right axillary lymph node with borderline thickening of the anterior cortex.  On 10/16/2016 the patient underwent biopsy of the right breast mass and the suspicious right axillary lymph node. The right breast mass proved to be an invasive ductal carcinoma, grade 3, estrogen and progesterone receptor negative, with an MIB-1 of 80%, and no HER-2 amplification, the signals ratio being 1.30 and the number per cell 1.75. The lymph node was negative and concordant.  Her subsequent history is as detailed below   INTERVAL HISTORY: Kelly Mendez returns today for follow-up and treatment of her triple negative breast cancer, as well as her DVT.   She continues under observation.  Since  her last visit here, she underwent a sleep study on 05/26/2018.  She is schedule for mammography on 10/05/2018.    REVIEW OF SYSTEMS: Kelly Mendez is back at work full time since 04/2018; she has some leeway for her anxiety, where she completes breathing exercises. Her gabapentin ran out two weeks ago, she has neruopathy in her palms and heels. She believes that she has had some swelling in her axilla.  She has had some diarrhea from her Metformin, but she says that it is manageable. She has had some sharp pains on her right side around her nipple. The patient denies unusual headaches, visual changes, nausea, vomiting, or dizziness. There has been no unusual cough, phlegm production, or pleurisy. This been no change in bowel or bladder habits. The patient denies unexplained fatigue or unexplained weight loss, bleeding, rash, or fever. A detailed review of systems was otherwise noncontributory.    PAST MEDICAL HISTORY: Past Medical History:  Diagnosis Date  . Abnormal Pap smear of cervix   . Anxiety   . Asthma   . Asymptomatic microscopic hematuria 09/29/2017  . 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 Concussion    around age 7  . Diabetes type 2, uncontrolled ([place])    new diagnosis in 08/2016  . DVT (deep venous thrombosis) ([place]) 11/24/2016   in rt arm  . Family history of breast cancer   . Family history of neurofibromatosis   . Family history of ovarian cancer   . History of radiation therapy 04/21/17- 05/20/42   Right Breast 40.05 Gy in 15 fractions followed by a 10 Gy boost in 43 fractions to yield a total dose of 50.05  Gy  . HPV in female   . Hyperlipemia   . Neuromuscular disorder (HCC)    neuropathy  . Personal history of chemotherapy 2018  . Personal history of radiation therapy 2018  . Pneumonia    2014   . PTSD (post-traumatic stress disorder)     PAST SURGICAL HISTORY: Past Surgical History:  Procedure Laterality Date  . BREAST LUMPECTOMY Right  03/18/2017  . BREAST LUMPECTOMY WITH RADIOACTIVE SEED AND SENTINEL LYMPH NODE BIOPSY Right 03/18/2017   Procedure: RIGHT BREAST LUMPECTOMY WITH RADIOACTIVE SEED AND RIGHT SENTINEL LYMPH NODE BIOPSY;  Surgeon: [name], MD;  Location: [facility];  Service: General;  Laterality: Right;  . COLPOSCOPY    . left ovary removed  1978   was told her ovary was removed but Dr. Denman George found that was not the case. Suspect this was originally an ovarian cystectomy  . PORTACATH PLACEMENT Right 10/29/2016   Procedure: INSERTION PORT-A-CATH WITH [place];  Surgeon: [name], MD;  Location: [facility];  Service: General;  Laterality: Right;  . portacath removal     oct. 5 2018  . ROBOTIC ASSISTED TOTAL HYSTERECTOMY Bilateral 06/16/2017   Procedure: XI ROBOTIC ASSISTED TOTAL HYSTERECTOMY WITH BILATERAL SALPINGECTOMY,  OOPHERECTOMY;  Surgeon: [name], MD;  Location: [facility];  Service: Gynecology;  Laterality: Bilateral;    FAMILY HISTORY Family History  Problem Relation Age of Onset  . Breast cancer Mother 79  . Hepatitis C Father   . Ovarian cancer Maternal Aunt        dx in her 32s  . Neurofibromatosis Maternal Uncle   . Brain cancer Maternal Uncle 58  . Lung cancer Maternal Grandfather   . Kidney failure Paternal Grandmother   . Heart attack Paternal Grandfather   . Ovarian cancer Maternal Aunt        dx in her 51s  . Neurofibromatosis Maternal Aunt   . Ovarian cancer Maternal Aunt   . Neurofibromatosis Maternal Aunt   . Cervical cancer Maternal Aunt   . Neurofibromatosis Maternal Aunt   . Cancer Maternal Aunt        cancer on the bottom of her foot  . Breast cancer Other        MGF's sisters  . Neurofibromatosis Other        MGM's paternal aunt   The patient's mother was diagnosed with breast cancer at age 43, but tells me the lump in her breast had been present for at least 10 years prior to that. She is now 43 and doing well. The patient's father died at the age of 16 from  sepsis following liver transplantation. The patient has 2 brothers, no sisters. The patient tells me that she has at least 5 and sent cousins with breast cancer and there are other relatives with uterine cancer.   GYNECOLOGIC HISTORY:  Patient's last menstrual period was 01/10/2017. Menarche age 53, first live birth age 38, the patient is GX P1. She has had multiple progesterone and oral contraceptive treatments through [facility] because of her menometrorrhagia. 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 She will graduate from [employer] in 11/2018 with her associates in business. Her husband [name] is a truck Geophysicist/field seismologist. The patient's daughter [name] is studying pre-veterinary medicine in college. [name] has 3 children, all boys,Akim is a cook in [place] and lives independently. The 2 younger boys, [name] and [name], aged 21 and 105 as  of April 2018, are at home with the patient.     ADVANCED DIRECTIVES: Not in place   HEALTH MAINTENANCE: Social History   Tobacco Use  . Smoking status: Former Smoker    Packs/day: 0.15    Years: 25.00    Pack years: 3.75    Types: Cigarettes    Last attempt to quit: 11/25/2016    Years since quitting: 1.7  . Smokeless tobacco: Never Used  Substance Use Topics  . Alcohol use: Yes    Alcohol/week: 1.0 standard drinks    Types: 1 Glasses of wine per week    Comment: rarely   . Drug use: No    Colonoscopy: n/a  PAP:  Bone density:   Allergies  Allergen Reactions  . Lisinopril     Cough, itchy throat  . Penicillins Other (See Comments)    As a child Has patient had a PCN reaction causing immediate rash, facial/tongue/throat swelling, SOB or lightheadedness with hypotension: unknown Has patient had a PCN reaction causing severe rash involving mucus membranes or skin necrosis: unknown Has patient had a PCN reaction that required hospitalization unknown Has patient had  a PCN reaction occurring within the last 10 years: no If all of the above answers are "NO", then may proceed with Cephalosporin use.     Current Outpatient Medications  Medication Sig Dispense Refill  . albuterol (PROAIR HFA) 108 (90 Base) MCG/ACT inhaler Inhale 2 puffs into the lungs every 6 (six) hours as needed for wheezing or shortness of breath. 1 Inhaler 0  . atorvastatin (LIPITOR) 20 MG tablet TAKE 1 TABLET BY MOUTH ONCE DAILY 90 tablet 1  . BIOTIN PO Take 1 capsule by mouth daily.    Marland Kitchen gabapentin (NEURONTIN) 300 MG capsule Take 1 capsule (300 mg total) by mouth at bedtime. 90 capsule 4  . Glucose Blood (BLOOD GLUCOSE TEST STRIPS) STRP Test twice a day. Pt uses one touch verio flex meter 100 each 5  . ibuprofen (ADVIL,MOTRIN) 200 MG tablet Take 400 mg by mouth daily as needed for headache or moderate pain.     . metFORMIN (GLUCOPHAGE XR) 750 MG 24 hr tablet Take 1 tablet (750 mg total) by mouth daily with breakfast. 30 tablet 3  . ONETOUCH DELICA LANCETS FINE MISC Test twice a day 100 each 5  . PREMARIN vaginal cream Place vaginally once a week. 42.5 g 12  . TRULICITY 5.36 UY/4.0HK SOPN INJECT 1 DOSE SUBCUTANEOUSLY ONCE A WEEK 12 mL 0  . venlafaxine XR (EFFEXOR-XR) 150 MG 24 hr capsule Take 1 capsule (150 mg total) by mouth daily with breakfast. 90 capsule 4   No current facility-administered medications for this visit.     OBJECTIVE: Young-appearing African-American woman appears well  Vitals:   08/11/18 1423  BP: (!) 139/91  Pulse: 99  Resp: 18  Temp: 98.5 F (36.9 C)  SpO2: 98%     Body mass index is 46.34 kg/m.    ECOG FS:1 - Symptomatic but completely ambulatory  Sclerae unicteric, EOMs intact Oropharynx clear and moist No cervical or supraclavicular adenopathy Lungs no rales or rhonchi Heart regular rate and rhythm Abd soft, obese, nontender, positive bowel sounds MSK no focal spinal tenderness, no right upper extremity lymphedema Neuro: nonfocal, well oriented,  appropriate affect Breasts: Right breast is status post lumpectomy and radiation.  There is some coarsening of the area in the lateral aspect of the areola.  This may be simply radiation change but she is very concerned that  this may be locally recurrent cancer.  The left breast is benign.  Both axillae are benign.  LAB RESULTS:  CMP     Component Value Date/Time   NA 136 08/11/2018 1239   NA 143 09/28/2017 1539   NA 140 05/07/2017 1221   K 4.5 08/11/2018 1239   K 4.0 05/07/2017 1221   CL 102 08/11/2018 1239   CO2 26 08/11/2018 1239   CO2 24 05/07/2017 1221   GLUCOSE 144 (H) 08/11/2018 1239   GLUCOSE 173 (H) 05/07/2017 1221   BUN 11 08/11/2018 1239   BUN 13 09/28/2017 1539   BUN 6.7 (L) 05/07/2017 1221   CREATININE 0.82 08/11/2018 1239   CREATININE 0.8 05/07/2017 1221   CALCIUM 9.7 08/11/2018 1239   CALCIUM 9.7 05/07/2017 1221   PROT 7.2 08/11/2018 1239   PROT 6.8 09/28/2017 1539   PROT 6.8 05/07/2017 1221   ALBUMIN 3.7 08/11/2018 1239   ALBUMIN 4.3 09/28/2017 1539   ALBUMIN 3.4 (L) 05/07/2017 1221   AST 12 (L) 08/11/2018 1239   AST 21 05/07/2017 1221   ALT 14 08/11/2018 1239   ALT 24 05/07/2017 1221   ALKPHOS 106 08/11/2018 1239   ALKPHOS 131 05/07/2017 1221   BILITOT 0.4 08/11/2018 1239   BILITOT 0.2 09/28/2017 1539   BILITOT 0.48 05/07/2017 1221   GFRNONAA >60 08/11/2018 1239   GFRAA >60 08/11/2018 1239    No results found for: Ronnald Ramp, A1GS, A2GS, BETS, BETA2SER, GAMS, MSPIKE, SPEI  No results found for: Nils Pyle, [facility]  Lab Results  Component Value Date   WBC 6.6 08/11/2018   NEUTROABS 4.6 08/11/2018   HGB 13.5 08/11/2018   HCT 41.6 08/11/2018   MCV 83.5 08/11/2018   PLT 252 08/11/2018      Chemistry      Component Value Date/Time   NA 136 08/11/2018 1239   NA 143 09/28/2017 1539   NA 140 05/07/2017 1221   K 4.5 08/11/2018 1239   K 4.0 05/07/2017 1221   CL 102 08/11/2018 1239   CO2 26 08/11/2018 1239   CO2  24 05/07/2017 1221   BUN 11 08/11/2018 1239   BUN 13 09/28/2017 1539   BUN 6.7 (L) 05/07/2017 1221   CREATININE 0.82 08/11/2018 1239   CREATININE 0.8 05/07/2017 1221      Component Value Date/Time   CALCIUM 9.7 08/11/2018 1239   CALCIUM 9.7 05/07/2017 1221   ALKPHOS 106 08/11/2018 1239   ALKPHOS 131 05/07/2017 1221   AST 12 (L) 08/11/2018 1239   AST 21 05/07/2017 1221   ALT 14 08/11/2018 1239   ALT 24 05/07/2017 1221   BILITOT 0.4 08/11/2018 1239   BILITOT 0.2 09/28/2017 1539   BILITOT 0.48 05/07/2017 1221       No results found for: LABCA2  No components found for: [id number]  No results for input(s): INR in the last 168 hours.  Urinalysis    Component Value Date/Time   COLORURINE YELLOW 10/29/2017 1532   APPEARANCEUR CLEAR 10/29/2017 1532   LABSPEC 1.015 01/12/2018 1100   PHURINE 5.0 10/29/2017 1532   GLUCOSEU 50 (A) 10/29/2017 1532   HGBUR MODERATE (A) 10/29/2017 1532   BILIRUBINUR negative 01/12/2018 1100   BILIRUBINUR n 09/29/2016 1045   KETONESUR negative 01/12/2018 [place] 10/29/2017 1532   PROTEINUR negative 01/12/2018 1100   PROTEINUR 30 (A) 10/29/2017 1532   UROBILINOGEN negative 09/29/2016 1045   NITRITE Negative 01/12/2018 1100   NITRITE NEGATIVE 10/29/2017 1532   LEUKOCYTESUR  Negative 01/12/2018 1100     STUDIES: No results found.   ELIGIBLE FOR AVAILABLE RESEARCH PROTOCOL: not a candidate for PREVENT   ASSESSMENT: 42 y.o.  [place] woman status post right breast upper inner quadrant biopsy 10/16/2016 for a clinical T1b pN0, stage IB invasive ductal carcinoma, grade 3, triple negative, with an MIB-1 of 80%.  (1)  genetics testing 12/01/2016 showed several variants of uncertain significance but no deleterious mutation  (2) neoadjuvant chemotherapy consisting of doxorubicin and cyclophosphamide in dose dense fashion 4 starting 11/05/2016, received third cycle 12/03/2016 then proceeded to weekly Abraxane starting  01/01/2017  (a) cycle 4 of cyclophosphamide and doxorubicin given at end of Abraxane on 02/12/2017  (b) Abraxane given from 01/01/17-01/22/17 (4 cycles), stopped early due to peripheral neuropathy  (3) status post right lumpectomy and sentinel lymph node sampling 03/18/2017 for a ypT1b ypN0 invasive ductal carcinoma, grade 3, with negative margins, and repeat prognostic panel again triple negative  (4) adjuvant radiation completed 05/20/2017 Site/dose:  The right breast was treated to 40.05 Gy in 15 fractions, followed by a 10 Gy boost in 5 fractions to yield a total dose of 50.05 Gy  (5) extensive right upper extremity deep venous thrombosis documented 11/23/2016, treated initially with Lovenox  (a) transitioned to rivaroxaban as of 11/30/2016  (b) total 6 months anticoagulation planned (one month beyond port removal)  (c) d-dimer 08/11/2017 normal  (d) rivaroxaban discontinued late January 2019 for financial reasons  (6) tobacco abuse: The patient quit smoking 11/26/2016  (7) menometrorrhagia, with a cystic right adnexal lesion and a possible cervical polyp noted on ultrasound 12/16/2016, with benign endocervical curettage and endometrial biopsy 12/16/2016  (a) CA 125 12/17/2016 was 14.2 (normal).  (b) adnexal mass, likely benign, will be explored at the completion of chemotherapy  (c) goserelin started 12/16/2016, continued every 28 days, last dose 06/05/2017  (d) status post hysterectomy and bilateral salpingo-oophorectomy 06/16/2017 with benign pathology  (8) iron deficiency anemia secondary to menometrorrhagia, status post Feraheme 05/20/2017 and 06/01/2017  (9) likely thalassemia trait  (10) post traumatic stress disorder  (a) venlafaxine increased to 150 mg daily as of 02/25/2018      PLAN: Cena is now a year and a half out from definitive surgery for her breast cancer with no evidence of disease recurrence.  This is favorable.  She is tolerating tamoxifen well and the plan  will be to continue that a minimum of 5 years.  We again discussed the use of vaginal estrogens and she understands we really do not have data for safety although we do anticipate that while the risk of breast cancer developing is likely not 0, it is also likely not large.  This is a quality-of-life issue and she is using it only weekly at this point  I do not appreciate any evidence of right upper extremity lymphedema.  She is very concerned about the right breast lateral areolar aspect.  I think this is going to be post radiation change but we will obtain a right mammogram in a week or so to confirm  Otherwise she will return to see me in October.  She knows to call for any other issue that may develop before that visit.    Magrinat, Virgie Dad, MD  08/11/18 2:24 PM Medical Oncology and Hematology [facility] [address] [place], [place] [address] Tel. [phone]    Fax. [phone]  I, Jacqualyn Posey am acting as a Education administrator for Chauncey Cruel, MD.   I,  Lurline Del MD, have reviewed the above documentation for accuracy and completeness, and I agree with the above.

## 2018-08-11 ENCOUNTER — Ambulatory Visit: Payer: Managed Care, Other (non HMO) | Admitting: Family Medicine

## 2018-08-11 ENCOUNTER — Telehealth: Payer: Self-pay | Admitting: Oncology

## 2018-08-11 ENCOUNTER — Inpatient Hospital Stay (HOSPITAL_BASED_OUTPATIENT_CLINIC_OR_DEPARTMENT_OTHER): Payer: Managed Care, Other (non HMO) | Admitting: Oncology

## 2018-08-11 ENCOUNTER — Inpatient Hospital Stay: Payer: Managed Care, Other (non HMO) | Attending: Oncology

## 2018-08-11 VITALS — BP 139/91 | HR 99 | Temp 98.5°F | Resp 18 | Ht 73.0 in | Wt 351.2 lb

## 2018-08-11 DIAGNOSIS — G629 Polyneuropathy, unspecified: Secondary | ICD-10-CM | POA: Insufficient documentation

## 2018-08-11 DIAGNOSIS — C50211 Malignant neoplasm of upper-inner quadrant of right female breast: Secondary | ICD-10-CM

## 2018-08-11 DIAGNOSIS — Z86718 Personal history of other venous thrombosis and embolism: Secondary | ICD-10-CM | POA: Diagnosis not present

## 2018-08-11 DIAGNOSIS — N6459 Other signs and symptoms in breast: Secondary | ICD-10-CM | POA: Insufficient documentation

## 2018-08-11 DIAGNOSIS — Z171 Estrogen receptor negative status [ER-]: Secondary | ICD-10-CM | POA: Diagnosis not present

## 2018-08-11 DIAGNOSIS — Z87891 Personal history of nicotine dependence: Secondary | ICD-10-CM

## 2018-08-11 DIAGNOSIS — F4310 Post-traumatic stress disorder, unspecified: Secondary | ICD-10-CM

## 2018-08-11 DIAGNOSIS — F419 Anxiety disorder, unspecified: Secondary | ICD-10-CM

## 2018-08-11 LAB — CBC WITH DIFFERENTIAL/PLATELET
Abs Immature Granulocytes: 0.02 10*3/uL (ref 0.00–0.07)
Basophils Absolute: 0 10*3/uL (ref 0.0–0.1)
Basophils Relative: 1 %
Eosinophils Absolute: 0.1 10*3/uL (ref 0.0–0.5)
Eosinophils Relative: 1 %
HCT: 41.6 % (ref 36.0–46.0)
Hemoglobin: 13.5 g/dL (ref 12.0–15.0)
Immature Granulocytes: 0 %
Lymphocytes Relative: 23 %
Lymphs Abs: 1.5 10*3/uL (ref 0.7–4.0)
MCH: 27.1 pg (ref 26.0–34.0)
MCHC: 32.5 g/dL (ref 30.0–36.0)
MCV: 83.5 fL (ref 80.0–100.0)
Monocytes Absolute: 0.3 10*3/uL (ref 0.1–1.0)
Monocytes Relative: 5 %
Neutro Abs: 4.6 10*3/uL (ref 1.7–7.7)
Neutrophils Relative %: 70 %
Platelets: 252 10*3/uL (ref 150–400)
RBC: 4.98 MIL/uL (ref 3.87–5.11)
RDW: 13.2 % (ref 11.5–15.5)
WBC: 6.6 10*3/uL (ref 4.0–10.5)
nRBC: 0 % (ref 0.0–0.2)

## 2018-08-11 LAB — COMPREHENSIVE METABOLIC PANEL
ALT: 14 U/L (ref 0–44)
AST: 12 U/L — ABNORMAL LOW (ref 15–41)
Albumin: 3.7 g/dL (ref 3.5–5.0)
Alkaline Phosphatase: 106 U/L (ref 38–126)
Anion gap: 8 (ref 5–15)
BUN: 11 mg/dL (ref 6–20)
CO2: 26 mmol/L (ref 22–32)
Calcium: 9.7 mg/dL (ref 8.9–10.3)
Chloride: 102 mmol/L (ref 98–111)
Creatinine, Ser: 0.82 mg/dL (ref 0.44–1.00)
GFR calc Af Amer: >60 mL/min (ref >60)
GFR calc non Af Amer: >60 mL/min (ref >60)
Glucose, Bld: 144 mg/dL — ABNORMAL HIGH (ref 70–99)
Potassium: 4.5 mmol/L (ref 3.5–5.1)
Sodium: 136 mmol/L (ref 135–145)
Total Bilirubin: 0.4 mg/dL (ref 0.3–1.2)
Total Protein: 7.2 g/dL (ref 6.5–8.1)

## 2018-08-11 MED ORDER — GABAPENTIN 300 MG PO CAPS: 300.0000 mg | ORAL_CAPSULE | Freq: Every day | ORAL | 4 refills | Status: DC

## 2018-08-11 MED ORDER — VENLAFAXINE HCL ER 150 MG PO CP24: 150.0000 mg | ORAL_CAPSULE | Freq: Every day | ORAL | 4 refills | Status: DC

## 2018-08-11 NOTE — Progress Notes (Deleted)
  Subjective:    Patient ID: Kelly Mendez, female    DOB: 1976/03/20, 43 y.o.   MRN: [id number]  Kelly Mendez is a 43 y.o. female who presents for follow-up of Type 2 diabetes mellitus.  Patient {is/are not:32546} checking home blood sugars.   Home blood sugar records: {dm home sugars:14018} How often is blood sugars being checked: *** Current symptoms include: {dm sx:14075}. Patient denies {dm sx:19199}.  Patient {is/are not:32546} checking their feet daily. Any Foot concerns (callous, ulcer, wound, thickened nails, toenail fungus, skin fungus, hammer toe): *** Last dilated eye exam: ***  Current treatments: {dm interventions:14074}. Medication compliance: {good/fair/poor:33178}  Current diet: {diet habits:16563} Current exercise: {exercise types:16438} Known diabetic complications: {diabetes complications:1215}  The following portions of the patient's history were reviewed and updated as appropriate: allergies, current medications, past medical history, past social history and problem list.  ROS as in subjective above.     Objective:    Physical Exam Alert and in no distress otherwise not examined.  Last menstrual period 01/10/2017.  Lab Review Diabetic Labs Latest Ref Rng & Units 04/14/2018 01/12/2018 09/28/2017 08/11/2017 07/30/2017  HbA1c 4.0 - 5.6 % - 6.7(A) 6.4% - -  Microalbumin Not estab mg/dL - - - - -  Micro/Creat Ratio <30 mcg/mg creat - - - - -  Chol 100 - 199 mg/dL - - 99(L) - -  HDL >39 mg/dL - - 34(L) - -  Calc LDL 0 - 99 mg/dL - - 54 - -  Triglycerides 0 - 149 mg/dL - - 57 - -  Creatinine 0.44 - 1.00 mg/dL 0.85 - 0.73 0.83 0.80   BP/Weight 05/19/2018 04/15/2018 04/14/2018 03/22/2018 09/26/1759  Systolic BP - 607 371 062 694  Diastolic BP - 82 91 74 89  Wt. (Lbs) 323 329 330.5 327.6 323.1  BMI 42.61 44.62 43.6 43.22 42.05   Foot/eye exam completion dates 09/28/2017  Foot Form Completion Done    Kelly Mendez  reports that she quit  smoking about 20 months ago. Her smoking use included cigarettes. She has a 3.75 pack-year smoking history. She has never used smokeless tobacco. She reports current alcohol use of about 1.0 standard drinks of alcohol per week. She reports that she does not use drugs.     Assessment & Plan:    Controlled type 2 diabetes mellitus with complication, without long-term current use of insulin ([place])  1. Rx changes: {none:33079} 2. Education: Reviewed 'ABCs' of diabetes management (respective goals in parentheses):  A1C (<7), blood pressure (<130/80), and cholesterol (LDL <100). 3. Compliance at present is estimated to be {good/fair/poor:33178}. Efforts to improve compliance (if necessary) will be directed at {compliance:16716}. 4. Follow up: {NUMBERS; 0-10:33138} {time:11}

## 2018-08-11 NOTE — Telephone Encounter (Signed)
Patient decline avs and calendar °

## 2018-08-12 ENCOUNTER — Ambulatory Visit: Payer: Managed Care, Other (non HMO) | Admitting: Family Medicine

## 2018-08-13 ENCOUNTER — Encounter: Payer: Self-pay | Admitting: Family Medicine

## 2018-08-13 ENCOUNTER — Ambulatory Visit (INDEPENDENT_AMBULATORY_CARE_PROVIDER_SITE_OTHER): Payer: Managed Care, Other (non HMO) | Admitting: Family Medicine

## 2018-08-13 VITALS — BP 120/84 | HR 91 | Wt 352.0 lb

## 2018-08-13 DIAGNOSIS — Z79899 Other long term (current) drug therapy: Secondary | ICD-10-CM | POA: Diagnosis not present

## 2018-08-13 DIAGNOSIS — I2510 Atherosclerotic heart disease of native coronary artery without angina pectoris: Secondary | ICD-10-CM | POA: Diagnosis not present

## 2018-08-13 DIAGNOSIS — F419 Anxiety disorder, unspecified: Secondary | ICD-10-CM

## 2018-08-13 DIAGNOSIS — E118 Type 2 diabetes mellitus with unspecified complications: Secondary | ICD-10-CM

## 2018-08-13 DIAGNOSIS — R232 Flushing: Secondary | ICD-10-CM

## 2018-08-13 LAB — POCT GLYCOSYLATED HEMOGLOBIN (HGB A1C): Hemoglobin A1C: 7 % — AB (ref 4.0–5.6)

## 2018-08-13 NOTE — Patient Instructions (Addendum)
Your hemoglobin A1c is 7.0% today. I recommend you make healthy food choices as discussed and cut back on carbohydrates. Continue the intermittent fasting if you wish but make sure you are eating protein.   I will refer you to the endocrinologist for management of your diabetes.   Call and schedule your diabetic eye exam at your convenience.   You might want to try over the counter [facility] for your hot flashes but I also recommend asking Dr. Jana Hakim about this as well.  Also curious when Dr. Jana Hakim recommends baseline bone density study.   You will receive a call from [facility] and [facility].   We will call you with your lab results.

## 2018-08-13 NOTE — Progress Notes (Signed)
Subjective:    Patient ID: Kelly Mendez, female    DOB: 05-12-1976, 43 y.o.   MRN: [id number]  Kelly Mendez is a 43 y.o. female who presents for follow-up of Type 2 diabetes mellitus and follow up on other chronic health conditions.   She is concerned about significant weight gain. States her diet was very unhealthy over the holidays. She is doing intermittent fasting now. Started this on 07/16/2017. Still making unhealthy food choices.   Patient is checking home blood sugars.   Home blood sugar records: BGs range between 80 and 140 How often is blood sugars being checked: libre freestyle- only for 10 days .she had a free trial.  Current symptoms include: none. Patient denies increased appetite, nausea, visual disturbances, vomiting and weight loss.  Patient is checking their feet daily. Any Foot concerns (callous, ulcer, wound, thickened nails, toenail fungus, skin fungus, hammer toe): none Last dilated eye exam: overdue   Current treatments: just back on metformin 1000mg  2x day, not taking trucility. Stopped in December.  Medication compliance: fair Current diet: in general, a "healthy" diet   since January. Snacks are occasionally poor such as honey buns.  Current exercise: walking 10-15 minutes twice a day 2x Known diabetic complications: none   Taking 2 classes at [facility].   The following portions of the patient's history were reviewed and updated as appropriate: allergies, current medications, past medical history, past social history and problem list.  ROS as in subjective above.     Objective:    Physical Exam Alert and in no distress otherwise not examined.   Blood pressure 120/84, pulse 91, weight (!) 352 lb (159.7 kg), last menstrual period 01/10/2017.  Lab Review Diabetic Labs Latest Ref Rng & Units 08/13/2018 08/11/2018 04/14/2018 01/12/2018 09/28/2017  HbA1c 4.0 - 5.6 % 7.0(A) - - 6.7(A) 6.4%  Microalbumin Not estab mg/dL - - - - -    Micro/Creat Ratio <30 mcg/mg creat - - - - -  Chol 100 - 199 mg/dL - - - - 99(L)  HDL >39 mg/dL - - - - 34(L)  Calc LDL 0 - 99 mg/dL - - - - 54  Triglycerides 0 - 149 mg/dL - - - - 57  Creatinine 0.44 - 1.00 mg/dL - 0.82 0.85 - 0.73   BP/Weight 08/13/2018 08/11/2018 05/19/2018 04/15/2018 78/10/6960  Systolic BP 952 841 - 324 401  Diastolic BP 84 91 - 82 91  Wt. (Lbs) 352 351.2 323 329 330.5  BMI 46.44 46.34 42.61 44.62 43.6   Foot/eye exam completion dates 09/28/2017  Foot Form Completion Done    [name]  reports that she quit smoking about 20 months ago. Her smoking use included cigarettes. She has a 3.75 pack-year smoking history. She has never used smokeless tobacco. She reports current alcohol use of about 1.0 standard drinks of alcohol per week. She reports that she does not use drugs.     Assessment & Plan:    Controlled type 2 diabetes mellitus with complication, without long-term current use of insulin ([place]) - Plan: HgB A1c, Microalbumin/Creatinine Ratio, Urine, TSH, T4, free, Amb Ref to Medical Weight Management, Ambulatory referral to Endocrinology  Morbid obesity ([place]) - Plan: TSH, T4, free, Lipid panel, Amb Ref to Medical Weight Management  ASCVD (arteriosclerotic cardiovascular disease) - Plan: Lipid panel, Amb Ref to Medical Weight Management  Medication management - Plan: Lipid panel  Anxiety  Hot flashes  1. Rx changes: start back on Trulicity and continue on twice  daily metformin.  Hgb A1c 7.0%  2. Referral to endocrinology per patient request.  She would like to use a daily monitoring system for her diabetes to keep closer track. 3. She has been sharing her medication with her husband who also has diabetes.  We discussed the importance of taking care of herself. 4. Referral to [facility] health weight management clinic for morbid obesity.  She is had a 30 pound weight gain since our last visit.  She is concerned about her weight and is very serious about weight loss.   Continue with intermittent fasting for now.  Counseling done on making healthy food choices and cutting back on portion sizes. 5. Hot flashes-history of breast cancer and pelvic mass.  Post hysterectomy.  Advised her to ask Dr. Jana Mendez her oncologist about this as I am not willing to prescribe hormones for her.  She is on Effexor for hot flashes as well as her mood.  She is seeing a therapist for anxiety and PTSD. 6. Education: Reviewed 'ABCs' of diabetes management (respective goals in parentheses):  A1C (<7), blood pressure (<130/80), and cholesterol (LDL <100). 7. Compliance at present is estimated to be fair. Efforts to improve compliance (if necessary) will be directed at dietary modifications: cut back on portion sizes, make healthier snack choices, increased exercise and regular blood sugar monitoring: 2 times daily. 8. Follow up: 6 months

## 2018-08-14 LAB — T4, FREE: Free T4: 0.81 ng/dL — ABNORMAL LOW (ref 0.82–1.77)

## 2018-08-14 LAB — MICROALBUMIN / CREATININE URINE RATIO
Creatinine, Urine: 143.6 mg/dL
Microalb/Creat Ratio: 21 mg/g creat (ref 0–29)
Microalbumin, Urine: 30.3 ug/mL

## 2018-08-14 LAB — LIPID PANEL
Chol/HDL Ratio: 3.3 ratio (ref 0.0–4.4)
Cholesterol, Total: 91 mg/dL — ABNORMAL LOW (ref 100–199)
HDL: 28 mg/dL — ABNORMAL LOW (ref >39)
LDL Calculated: 47 mg/dL (ref 0–99)
Triglycerides: 81 mg/dL (ref 0–149)
VLDL Cholesterol Cal: 16 mg/dL (ref 5–40)

## 2018-08-14 LAB — TSH: TSH: 3.01 u[IU]/mL (ref 0.450–4.500)

## 2018-09-03 ENCOUNTER — Other Ambulatory Visit: Payer: Self-pay

## 2018-09-03 ENCOUNTER — Ambulatory Visit (INDEPENDENT_AMBULATORY_CARE_PROVIDER_SITE_OTHER): Payer: Managed Care, Other (non HMO) | Admitting: Internal Medicine

## 2018-09-03 ENCOUNTER — Encounter: Payer: Self-pay | Admitting: Internal Medicine

## 2018-09-03 VITALS — BP 126/84 | HR 93 | Resp 16 | Ht 73.0 in | Wt 354.2 lb

## 2018-09-03 DIAGNOSIS — E1142 Type 2 diabetes mellitus with diabetic polyneuropathy: Secondary | ICD-10-CM

## 2018-09-03 DIAGNOSIS — R232 Flushing: Secondary | ICD-10-CM | POA: Diagnosis not present

## 2018-09-03 MED ORDER — DULAGLUTIDE 1.5 MG/0.5ML ~~LOC~~ SOAJ: 1.5000 mg | SUBCUTANEOUS | 6 refills | Status: DC

## 2018-09-03 MED ORDER — FREESTYLE LIBRE 14 DAY READER DEVI: 1.0000 | 0 refills | Status: DC

## 2018-09-03 MED ORDER — FREESTYLE LIBRE 14 DAY SENSOR MISC: 1.0000 | Freq: Three times a day (TID) | 6 refills | Status: DC

## 2018-09-03 MED ORDER — FREESTYLE LIBRE 14 DAY READER DEVI: 1.0000 | Freq: Three times a day (TID) | 0 refills | Status: DC

## 2018-09-03 NOTE — Progress Notes (Signed)
Name: Kelly Mendez  MRN/ DOB: [id number], 10-01-1975   Age/ Sex: 43 y.o., female    PCP: [name], NP-C   Reason for Endocrinology Evaluation: Type 2 Diabetes Mellitus     Date of Initial Endocrinology Visit: 09/03/2018     PATIENT IDENTIFIER: Kelly Mendez is a 43 y.o. female with a past medical history of Right breast ca (03/19/2017 S/P lumpectomy, chemo and radiation). The patient presented for initial endocrinology clinic visit on 09/03/2018 for consultative assistance with her diabetes management.    HPI: [name] was    Diagnosed with T2DM 2018 Prior Medications tried/Intolerance: Has been on metformin since diagnosis, trulicity added a year ago Currently checking blood sugars 1 x / day,  before breakfast  Hypoglycemia episodes : no             Hemoglobin A1c has ranged from 6.7% in 2019, peaking at 8.8% in 2018. Patient required assistance for hypoglycemia: no Patient has required hospitalization within the last 1 year from hyper or hypoglycemia: no  In terms of diet, the patient  Has been avoiding sugar-sweetened beverages for the past month, and eats a snack at night .    Patient with history of breast cancer, and history of DVT, she is on intravaginal Premarin, she is complaining of consistent hot flashes.  Patient is on gabapentin and venlafaxine.  Patient is interested in trying HRT for hot flashes.  HOME DIABETES REGIMEN: Metformin 5809 mg BID  Trulicity 9.83 mg weekly (Saturday )  Statin: yes ACE-I/ARB: no Prior Diabetic Education: yes   GLUCOSE LOG:  110-133 mg/dL fasting    DIABETIC COMPLICATIONS: Microvascular complications:   Neuropathy   Denies: CKD, retinopathy  Last eye exam: Completed 2018  Macrovascular complications:    Denies: CAD, PVD, CVA   PAST HISTORY: Past Medical History:  Past Medical History:  Diagnosis Date  . Abnormal Pap smear of cervix   . Anxiety   . Asthma   .  Asymptomatic microscopic hematuria 09/29/2017  . 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 Concussion    around age 66  . Diabetes type 2, uncontrolled ([place])    new diagnosis in 08/2016  . DVT (deep venous thrombosis) ([place]) 11/24/2016   in rt arm  . Family history of breast cancer   . Family history of neurofibromatosis   . Family history of ovarian cancer   . History of radiation therapy 04/21/17- 05/20/17   Right Breast 40.05 Gy in 15 fractions followed by a 10 Gy boost in 5 fractions to yield a total dose of 50.05 Gy  . HPV in female   . Hyperlipemia   . Malignant neoplasm of upper-inner quadrant of right breast in female, estrogen receptor negative ([place]) 10/21/2016  . Neuromuscular disorder (HCC)    neuropathy  . Pelvic mass in female 06/16/2017  . Personal history of chemotherapy 2018  . Personal history of radiation therapy 2018  . Pneumonia    2014   . PTSD (post-traumatic stress disorder)    Past Surgical History:  Past Surgical History:  Procedure Laterality Date  . BREAST LUMPECTOMY Right 03/18/2017  . BREAST LUMPECTOMY WITH RADIOACTIVE SEED AND SENTINEL LYMPH NODE BIOPSY Right 03/18/2017   Procedure: RIGHT BREAST LUMPECTOMY WITH RADIOACTIVE SEED AND RIGHT SENTINEL LYMPH NODE BIOPSY;  Surgeon: [name], MD;  Location: [place];  Service: General;  Laterality: Right;  . COLPOSCOPY    .  left ovary removed  1978   was told her ovary was removed but Dr. Denman George found that was not the case. Suspect this was originally an ovarian cystectomy  . PORTACATH PLACEMENT Right 10/29/2016   Procedure: INSERTION PORT-A-CATH WITH [place];  Surgeon: [name], MD;  Location: [place];  Service: General;  Laterality: Right;  . portacath removal     oct. 5 2018  . ROBOTIC ASSISTED TOTAL HYSTERECTOMY Bilateral 06/16/2017   Procedure: XI ROBOTIC ASSISTED TOTAL HYSTERECTOMY WITH BILATERAL SALPINGECTOMY,  OOPHERECTOMY;  Surgeon: [name], MD;   Location: [facility];  Service: Gynecology;  Laterality: Bilateral;      Social History:  reports that she quit smoking about 21 months ago. Her smoking use included cigarettes. She has a 3.75 pack-year smoking history. She has never used smokeless tobacco. She reports current alcohol use of about 1.0 standard drinks of alcohol per week. She reports that she does not use drugs. Family History:  Family History  Problem Relation Age of Onset  . Breast cancer Mother 92  . Hepatitis C Father   . Ovarian cancer Maternal Aunt        dx in her 59s  . Neurofibromatosis Maternal Uncle   . Brain cancer Maternal Uncle 58  . Lung cancer Maternal Grandfather   . Kidney failure Paternal Grandmother   . Heart attack Paternal Grandfather   . Ovarian cancer Maternal Aunt        dx in her 9s  . Neurofibromatosis Maternal Aunt   . Ovarian cancer Maternal Aunt   . Neurofibromatosis Maternal Aunt   . Cervical cancer Maternal Aunt   . Neurofibromatosis Maternal Aunt   . Cancer Maternal Aunt        cancer on the bottom of her foot  . Breast cancer Other        MGF's sisters  . Neurofibromatosis Other        MGM's paternal aunt     HOME MEDICATIONS: Allergies as of 09/03/2018      Reactions   Lisinopril    Cough, itchy throat   Penicillins Other (See Comments)   As a child Has patient had a PCN reaction causing immediate rash, facial/tongue/throat swelling, SOB or lightheadedness with hypotension: unknown Has patient had a PCN reaction causing severe rash involving mucus membranes or skin necrosis: unknown Has patient had a PCN reaction that required hospitalization unknown Has patient had a PCN reaction occurring within the last 10 years: no If all of the above answers are "NO", then may proceed with Cephalosporin use.      Medication List       Accurate as of September 03, 2018  2:29 PM. Always use your most recent med list.        albuterol 108 (90 Base) MCG/ACT inhaler Commonly known  as:  PROAIR HFA Inhale 2 puffs into the lungs every 6 (six) hours as needed for wheezing or shortness of breath.   atorvastatin 20 MG tablet Commonly known as:  LIPITOR TAKE 1 TABLET BY MOUTH ONCE DAILY   BLOOD GLUCOSE TEST STRIPS Strp Test twice a day. Pt uses one touch verio flex meter   gabapentin 300 MG capsule Commonly known as:  NEURONTIN Take 1 capsule (300 mg total) by mouth at bedtime.   ibuprofen 200 MG tablet Commonly known as:  ADVIL,MOTRIN Take 400 mg by mouth daily as needed for headache or moderate pain.   metFORMIN 1000 MG tablet Commonly known as:  GLUCOPHAGE 1,000 mg 2 (  two) times daily.   ONETOUCH DELICA LANCETS FINE Misc Test twice a day   PREMARIN vaginal cream Generic drug:  conjugated estrogens Place vaginally once a week.   TRULICITY 4.00 QQ/7.6PP Sopn Generic drug:  Dulaglutide INJECT 1 DOSE SUBCUTANEOUSLY ONCE A WEEK   venlafaxine XR 150 MG 24 hr capsule Commonly known as:  EFFEXOR-XR Take 1 capsule (150 mg total) by mouth daily with breakfast.        ALLERGIES: Allergies  Allergen Reactions  . Lisinopril     Cough, itchy throat  . Penicillins Other (See Comments)    As a child Has patient had a PCN reaction causing immediate rash, facial/tongue/throat swelling, SOB or lightheadedness with hypotension: unknown Has patient had a PCN reaction causing severe rash involving mucus membranes or skin necrosis: unknown Has patient had a PCN reaction that required hospitalization unknown Has patient had a PCN reaction occurring within the last 10 years: no If all of the above answers are "NO", then may proceed with Cephalosporin use.      REVIEW OF SYSTEMS: A comprehensive ROS was conducted with the patient and is negative except as per HPI and below:  Review of Systems  Constitutional: Negative for fever.  HENT: Negative for congestion and sore throat.   Eyes: Negative for blurred vision and pain.  Respiratory: Positive for cough.  Negative for shortness of breath.   Cardiovascular: Negative for chest pain and palpitations.  Gastrointestinal: Positive for diarrhea. Negative for nausea.  Genitourinary: Positive for frequency.  Skin: Negative.   Neurological: Positive for tingling. Negative for tremors.  Endo/Heme/Allergies: Positive for polydipsia.  Psychiatric/Behavioral: Negative for depression. The patient is not nervous/anxious.       OBJECTIVE:   VITAL SIGNS: BP 126/84 (BP Location: Left Arm, Patient Position: Sitting, Cuff Size: Large)   Pulse 93   Resp 16   Ht 6\' 1"  (1.854 m)   Wt (!) 354 lb 3.2 oz (160.7 kg)   LMP 01/10/2017   SpO2 97%   BMI 46.73 kg/m    PHYSICAL EXAM:  General: Pt appears well and is in NAD  Hydration: Well-hydrated with moist mucous membranes and good skin turgor  HEENT: Head: Unremarkable with good dentition. Oropharynx clear without exudate.  Eyes: External eye exam normal without stare, lid lag or exophthalmos.  EOM intact.  PERRL.  Neck: General: Supple without adenopathy or carotid bruits. Thyroid: Thyroid size normal.  No goiter or nodules appreciated. No thyroid bruit.  Lungs: Clear with good BS bilat with no rales, rhonchi, or wheezes  Heart: RRR with normal S1 and S2 and no gallops; no murmurs; no rub  Abdomen: Normoactive bowel sounds, soft, nontender, without masses or organomegaly palpable  Extremities:  Lower extremities - No pretibial edema. No lesions.  Skin: Normal texture and temperature to palpation. No rash noted. No Acanthosis nigricans/skin tags. No lipohypertrophy.  Neuro: MS is good with appropriate affect, pt is alert and Ox3    DM foot exam: 09/03/2018 The skin of the feet is intact without sores or ulcerations. The pedal pulses are 2+ on right and 2+ on left. The sensation is intact to a screening 5.07, 10 gram monofilament bilaterally   DATA REVIEWED:  Lab Results  Component Value Date   HGBA1C 7.0 (A) 08/13/2018   HGBA1C 6.7 (A) 01/12/2018     HGBA1C 6.4% 09/28/2017    Lab Results  Component Value Date   CHOL 91 (L) 08/13/2018   HDL 28 (L) 08/13/2018   LDLCALC 47 08/13/2018  TRIG 81 08/13/2018   CHOLHDL 3.3 08/13/2018         CMP     Component Value Date/Time   NA 136 08/11/2018 1239   NA 143 09/28/2017 1539   NA 140 05/07/2017 1221   K 4.5 08/11/2018 1239   K 4.0 05/07/2017 1221   CL 102 08/11/2018 1239   CO2 26 08/11/2018 1239   CO2 24 05/07/2017 1221   GLUCOSE 144 (H) 08/11/2018 1239   GLUCOSE 173 (H) 05/07/2017 1221   BUN 11 08/11/2018 1239   BUN 13 09/28/2017 1539   BUN 6.7 (L) 05/07/2017 1221   CREATININE 0.82 08/11/2018 1239   CREATININE 0.8 05/07/2017 1221   CALCIUM 9.7 08/11/2018 1239   CALCIUM 9.7 05/07/2017 1221   PROT 7.2 08/11/2018 1239   PROT 6.8 09/28/2017 1539   PROT 6.8 05/07/2017 1221   ALBUMIN 3.7 08/11/2018 1239   ALBUMIN 4.3 09/28/2017 1539   ALBUMIN 3.4 (L) 05/07/2017 1221   AST 12 (L) 08/11/2018 1239   AST 21 05/07/2017 1221   ALT 14 08/11/2018 1239   ALT 24 05/07/2017 1221   ALKPHOS 106 08/11/2018 1239   ALKPHOS 131 05/07/2017 1221   BILITOT 0.4 08/11/2018 1239   BILITOT 0.2 09/28/2017 1539   BILITOT 0.48 05/07/2017 1221   GFRNONAA >60 08/11/2018 1239   GFRAA >60 08/11/2018 1239    Results for Ta, Shirel Revonda Humphrey (MRN [id number]) as of 09/03/2018 11:31  Ref. Range 08/13/2018 14:00  MICROALB/CREAT RATIO Latest Ref Range: 0 - 29 mg/g creat 21     Old records , labs and images have been reviewed.   ASSESSMENT / PLAN / RECOMMENDATIONS:   1) Type 2 Diabetes Mellitus, optimally controlled, With neuropathic complications - Most recent A1c of 7.0 %. Goal A1c <7.0 %.  Plan: GENERAL:  Patient is very motivated and self diabetes care, I have encouraged her to continue avoiding sugar sweetened beverages, and to avoid snacks, we discussed low carb snack options.  Patient would like to continue to reduce her glucose readings, she is not very satisfied with the 130s  milligrams per deciliter, I have encouraged her to check her bedtime glucose as this will give her an insight into why her fasting glucose are always in the 130s and not any lower.  We will increase the Trulicity to 1.5 mg weekly, we have discussed GI side effects such as nausea and diarrhea, she was encouraged to call us with any concerns. I have discussed with the patient the pathophysiology of diabetes. We went over the natural progression of the disease. We talked about both insulin resistance and insulin deficiency. We stressed the importance of lifestyle changes including diet and exercise. I explained the complications associated with diabetes including retinopathy, nephropathy, neuropathy as well as increased risk of cardiovascular disease. We went over the benefit seen with glycemic control.   MEDICATIONS:  Continue metformin 1000 mg twice daily  Increase Trulicity to 1.5 mg weekly (Saturday)  EDUCATION / INSTRUCTIONS:  BG monitoring instructions: Patient is instructed to check her blood sugars 2 times a day, fasting and bedtime.  Call [place] [facility] if: BG persistently < 70 or > 300. . I reviewed the Rule of 15 for the treatment of hypoglycemia in detail with the patient. Literature supplied.   2) Diabetic complications:   Eye: Does not have known diabetic retinopathy.   Neuro/ Feet: Does have known diabetic peripheral neuropathy.  Renal: Patient does not have known baseline CKD. She is not on  an ACEI/ARB at present,its not indicated   3) Lipids: Patient is  on a statin.    4) Hot Flashes:  -We discussed the high risk of breast cancer and DVTs as well as strokes in woman on HRT therapy, she is at a very high risk for the above, and I am not sure the benefit would outweigh the risk at this time. -We could consider other alternatives, she is already on gabapentin, we could consider increasing the dose in the future.  She is already on  venlafaxine.    Follow-up in 3 months   Signed electronically by: Mack Guise, MD  [facility]  [facility] [address] [place], [place] [address] Phone: [phone] FAX: [phone]   CC: [name], NP-C [name] [address] Phone: [phone]  Fax: [phone]    Return to Endocrinology clinic as below: Future Appointments  Date Time Provider [place]  09/21/2018 10:00 AM Jearld Lesch A, DO [facility] None  10/05/2018 10:30 AM [facility] DIAG TOMO 2 [facility] GI-BREAST CE  10/05/2018  2:00 PM Jearld Lesch A, DO [facility] None  05/12/2019  1:30 PM [facility] LAB 3 [facility] None  05/12/2019  2:00 PM Magrinat, Virgie Dad, MD [facility] None

## 2018-09-03 NOTE — Patient Instructions (Addendum)
-   Increase trulicity to 1.5 mg weekly  - Continue Metformin 1000 mg Twice a day   - Check sugar before breakfast and Bedtime     Choose healthy, lower carb lower calorie snacks: toss salad, cooked vegetables, cottage cheese, peanut butter, low fat cheese / string cheese, lower sodium deli meat, tuna salad or chicken salad

## 2018-09-07 ENCOUNTER — Encounter (INDEPENDENT_AMBULATORY_CARE_PROVIDER_SITE_OTHER): Payer: Managed Care, Other (non HMO)

## 2018-09-21 ENCOUNTER — Ambulatory Visit (INDEPENDENT_AMBULATORY_CARE_PROVIDER_SITE_OTHER): Payer: Managed Care, Other (non HMO) | Admitting: Bariatrics

## 2018-09-24 ENCOUNTER — Other Ambulatory Visit: Payer: Self-pay | Admitting: Family Medicine

## 2018-10-04 ENCOUNTER — Other Ambulatory Visit: Payer: Self-pay | Admitting: Family Medicine

## 2018-10-04 MED ORDER — ALBUTEROL SULFATE HFA 108 (90 BASE) MCG/ACT IN AERS: 2.0000 | INHALATION_SPRAY | Freq: Four times a day (QID) | RESPIRATORY_TRACT | 0 refills | Status: DC | PRN

## 2018-10-04 NOTE — Telephone Encounter (Signed)
Is this okay to refill? 

## 2018-10-05 ENCOUNTER — Other Ambulatory Visit: Payer: Self-pay

## 2018-10-05 ENCOUNTER — Ambulatory Visit (INDEPENDENT_AMBULATORY_CARE_PROVIDER_SITE_OTHER): Payer: Managed Care, Other (non HMO) | Admitting: Bariatrics

## 2018-10-05 ENCOUNTER — Ambulatory Visit
Admission: RE | Admit: 2018-10-05 | Discharge: 2018-10-05 | Disposition: A | Discharge status: Home / Self Care | Payer: Managed Care, Other (non HMO) | Source: Ambulatory Visit | Attending: Oncology | Admitting: Oncology

## 2018-10-05 DIAGNOSIS — R921 Mammographic calcification found on diagnostic imaging of breast: Secondary | ICD-10-CM

## 2018-10-10 ENCOUNTER — Encounter: Payer: Self-pay | Admitting: Family Medicine

## 2018-10-11 ENCOUNTER — Encounter: Payer: Self-pay | Admitting: Internal Medicine

## 2018-10-11 ENCOUNTER — Other Ambulatory Visit: Payer: Self-pay

## 2018-10-11 MED ORDER — ALBUTEROL SULFATE HFA 108 (90 BASE) MCG/ACT IN AERS: 2.0000 | INHALATION_SPRAY | Freq: Four times a day (QID) | RESPIRATORY_TRACT | 0 refills | Status: DC | PRN

## 2018-10-11 MED ORDER — METFORMIN HCL 1000 MG PO TABS: 1000.0000 mg | ORAL_TABLET | Freq: Two times a day (BID) | ORAL | 3 refills | Status: DC

## 2018-10-12 ENCOUNTER — Ambulatory Visit (INDEPENDENT_AMBULATORY_CARE_PROVIDER_SITE_OTHER): Payer: Managed Care, Other (non HMO) | Admitting: Family Medicine

## 2018-10-12 ENCOUNTER — Encounter: Payer: Self-pay | Admitting: Family Medicine

## 2018-10-12 ENCOUNTER — Other Ambulatory Visit: Payer: Self-pay

## 2018-10-12 VITALS — HR 87 | Temp 97.8°F | Wt 341.0 lb

## 2018-10-12 DIAGNOSIS — R05 Cough: Secondary | ICD-10-CM

## 2018-10-12 DIAGNOSIS — J4520 Mild intermittent asthma, uncomplicated: Secondary | ICD-10-CM | POA: Diagnosis not present

## 2018-10-12 DIAGNOSIS — R059 Cough, unspecified: Secondary | ICD-10-CM

## 2018-10-12 NOTE — Progress Notes (Signed)
Subjective:   Documentation for virtual audio and video telecommunications through WebEx encounter:  The patient was located at home. The patient did consent to this visit and is aware of possible charges through their insurance for this visit.    Patient ID: Kelly Mendez, female    DOB: May 31, 1976, 43 y.o.   MRN: [id number]  HPI Chief Complaint  Patient presents with  . cough and chest tightness    cough and chest tightness for the last 2 weeks. SOB, no fever, headache, fatigue, diarrhea, dizziness. trouble laying down will make cough worse   She is a 43 year old female with a history of asthma who complains of a 2 week history of dry cough, chest congestion, and chest tightness.  She also reports feeling short of breath at times.  She is able to walk a couple of blocks without difficulty breathing however.  No exertional chest pain. States albuterol inhaler helps with cough and chest tightness.  No wheezing.   She is not currently using a maintenance inhaler for her asthma.  She did see pulmonology in the past and was told she had mild asthma and did not require daily maintenance.  She has been doing fine until the past couple of weeks.  Denies sneezing, rhinorrhea, nasal congestion, sinus pain, ear pain, sore throat, or postnasal drainage.  States she would not even be calling today however, due to the current coronavirus she was concerned that she may need to be tested.  Denies any exposure to someone with COVID-19 and no recent travel.  Her husband is a Administrator and has recently been to [place] and back.  She states he is not sick.  Denies fever, chills, dizziness, abdominal pain, nausea, vomiting.  States she feels like she has normal respirations and heart rate.   No leg or calf pain or swelling.  She does have a history of DVT in her right upper arm. Not currently anticoagulated.   Reviewed allergies, medications, past medical, surgical, family, and  social history.     Review of Systems Pertinent positives and negatives in the history of present illness.     Objective:   Physical Exam Pulse 87   Temp 97.8 F (36.6 C) (Oral)   Wt (!) 341 lb (154.7 kg)   LMP 01/10/2017   BMI 44.99 kg/m   Alert and oriented and in no acute distress.  Speaking in complete sentences without difficulty, normal respirations, unlabored.      Assessment & Plan:  Mild intermittent asthma, unspecified whether complicated  Cough  Discussed that she is very low risk for COVID-19 and does not meet criteria under the current guidelines to be tested. No red flag symptoms.  No obvious sign of infection. Discussed that her symptoms appear to be related to asthma.   She has an Asmanex inhaler at home but she has not been using.  She will start using this and advised her to rinse her mouth out after each use.  Discussed the possibility of oral steroids however, she and I both agree that her symptoms do not warrant this treatment at this time. Also encouraged her to stay well-hydrated and to try Mucinex.  No history of underlying allergies and she does not have typical allergy symptoms. She does have a history of DVT in her right upper arm so we did consider this as a possibility however, this is low on her list of differentials.  Strict precautions that if she develops worsening chest pain,  shortness of breath or any new symptoms that she should listen to her body and go to the ED.

## 2018-10-14 ENCOUNTER — Emergency Department (HOSPITAL_COMMUNITY): Payer: Managed Care, Other (non HMO)

## 2018-10-14 ENCOUNTER — Encounter (HOSPITAL_COMMUNITY): Payer: Self-pay

## 2018-10-14 ENCOUNTER — Emergency Department (HOSPITAL_COMMUNITY)
Admission: EM | Admit: 2018-10-14 | Discharge: 2018-10-14 | Disposition: A | Discharge status: Home / Self Care | Payer: Managed Care, Other (non HMO) | Attending: Emergency Medicine | Admitting: Emergency Medicine

## 2018-10-14 ENCOUNTER — Emergency Department (HOSPITAL_BASED_OUTPATIENT_CLINIC_OR_DEPARTMENT_OTHER): Payer: Managed Care, Other (non HMO)

## 2018-10-14 ENCOUNTER — Other Ambulatory Visit: Payer: Self-pay

## 2018-10-14 DIAGNOSIS — R0602 Shortness of breath: Secondary | ICD-10-CM | POA: Diagnosis present

## 2018-10-14 DIAGNOSIS — M79621 Pain in right upper arm: Secondary | ICD-10-CM | POA: Insufficient documentation

## 2018-10-14 DIAGNOSIS — J45901 Unspecified asthma with (acute) exacerbation: Secondary | ICD-10-CM

## 2018-10-14 DIAGNOSIS — Z79899 Other long term (current) drug therapy: Secondary | ICD-10-CM | POA: Insufficient documentation

## 2018-10-14 DIAGNOSIS — Z87891 Personal history of nicotine dependence: Secondary | ICD-10-CM | POA: Diagnosis not present

## 2018-10-14 DIAGNOSIS — I82409 Acute embolism and thrombosis of unspecified deep veins of unspecified lower extremity: Secondary | ICD-10-CM | POA: Diagnosis not present

## 2018-10-14 DIAGNOSIS — Z853 Personal history of malignant neoplasm of breast: Secondary | ICD-10-CM | POA: Insufficient documentation

## 2018-10-14 DIAGNOSIS — E119 Type 2 diabetes mellitus without complications: Secondary | ICD-10-CM | POA: Insufficient documentation

## 2018-10-14 DIAGNOSIS — Z7984 Long term (current) use of oral hypoglycemic drugs: Secondary | ICD-10-CM | POA: Diagnosis not present

## 2018-10-14 LAB — CBC WITH DIFFERENTIAL/PLATELET
Abs Immature Granulocytes: 0.03 10*3/uL (ref 0.00–0.07)
Basophils Absolute: 0 10*3/uL (ref 0.0–0.1)
Basophils Relative: 0 %
Eosinophils Absolute: 0.1 10*3/uL (ref 0.0–0.5)
Eosinophils Relative: 2 %
HCT: 44.2 % (ref 36.0–46.0)
Hemoglobin: 13.4 g/dL (ref 12.0–15.0)
Immature Granulocytes: 1 %
Lymphocytes Relative: 26 %
Lymphs Abs: 1.7 10*3/uL (ref 0.7–4.0)
MCH: 26.2 pg (ref 26.0–34.0)
MCHC: 30.3 g/dL (ref 30.0–36.0)
MCV: 86.5 fL (ref 80.0–100.0)
Monocytes Absolute: 0.4 10*3/uL (ref 0.1–1.0)
Monocytes Relative: 6 %
Neutro Abs: 4.1 10*3/uL (ref 1.7–7.7)
Neutrophils Relative %: 65 %
Platelets: 256 10*3/uL (ref 150–400)
RBC: 5.11 MIL/uL (ref 3.87–5.11)
RDW: 14 % (ref 11.5–15.5)
WBC: 6.2 10*3/uL (ref 4.0–10.5)
nRBC: 0 % (ref 0.0–0.2)

## 2018-10-14 LAB — BASIC METABOLIC PANEL
Anion gap: 10 (ref 5–15)
BUN: 14 mg/dL (ref 6–20)
CO2: 25 mmol/L (ref 22–32)
Calcium: 9.2 mg/dL (ref 8.9–10.3)
Chloride: 102 mmol/L (ref 98–111)
Creatinine, Ser: 0.75 mg/dL (ref 0.44–1.00)
GFR calc Af Amer: >60 mL/min (ref >60)
GFR calc non Af Amer: >60 mL/min (ref >60)
Glucose, Bld: 142 mg/dL — ABNORMAL HIGH (ref 70–99)
Potassium: 4 mmol/L (ref 3.5–5.1)
Sodium: 137 mmol/L (ref 135–145)

## 2018-10-14 LAB — TROPONIN I: Troponin I: <0.03 ng/mL (ref <0.03)

## 2018-10-14 LAB — I-STAT BETA HCG BLOOD, ED (MC, WL, AP ONLY): I-stat hCG, quantitative: <5 m[IU]/mL (ref <5)

## 2018-10-14 MED ORDER — PREDNISONE 20 MG PO TABS
60.0000 mg | ORAL_TABLET | Freq: Once | ORAL | Status: AC
  Administered 2018-10-14: 60 mg via ORAL
  Filled 2018-10-14: qty 3

## 2018-10-14 MED ORDER — PREDNISONE 20 MG PO TABS: 60.0000 mg | ORAL_TABLET | Freq: Every day | ORAL | 0 refills | Status: AC

## 2018-10-14 MED ORDER — ALBUTEROL SULFATE HFA 108 (90 BASE) MCG/ACT IN AERS
8.0000 | INHALATION_SPRAY | Freq: Once | RESPIRATORY_TRACT | Status: AC
  Administered 2018-10-14: 8 via RESPIRATORY_TRACT
  Filled 2018-10-14: qty 6.7

## 2018-10-14 NOTE — ED Notes (Signed)
This RN spoke with patient. Patient right arm restricted from BP and sticks due to lymph node removal, breast cancer, and DVT. This RN did not see any potential veins for phlebotomy in left arm. MD states he does not want IV placement due to potential blood clots. Phlebotomy notified of patient need for straight stick.

## 2018-10-14 NOTE — ED Provider Notes (Signed)
[place] DEPT Provider Note   CSN: [id number] Arrival date & time: 10/14/18  1024    History   Chief Complaint Chief Complaint  Patient presents with   Shortness of Breath   Arm Pain    HPI Kelly Mendez is a 43 y.o. female.     The history is provided by the patient.  Shortness of Breath  Severity:  Mild Onset quality:  Gradual Timing:  Constant Progression:  Unchanged Chronicity:  Recurrent Context comment:  Cough, SOB, RUE pain like similar to prior DVT, hx of asthma, was called in asthma medicine but hadnt picked it up. 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   Concussion    around age 89   Diabetes type 2, uncontrolled ([place])    new diagnosis in 08/2016   DVT (deep venous thrombosis) ([place]) 11/24/2016   in rt arm   Family history of breast cancer    Family history of neurofibromatosis    Family history of ovarian cancer    History of radiation therapy 04/21/17- 05/20/17   Right Breast 40.05 Gy in 15 fractions followed by a 10 Gy boost in 5 fractions to yield a total dose of 50.05 Gy   HPV in female    Hyperlipemia    Malignant neoplasm of upper-inner quadrant of right breast in female, estrogen receptor negative ([facility]) 10/21/2016   Neuromuscular disorder ([facility])    neuropathy   Pelvic mass in female 06/16/2017   Personal history of chemotherapy 2018   Personal history of radiation therapy 2018   Pneumonia    2014    PTSD (post-traumatic stress disorder)       Patient Active Problem List   Diagnosis Date Noted   Hot flashes 08/13/2018   Snoring 04/15/2018   Post traumatic stress disorder (PTSD) 02/25/2018   Anxiety    Asymptomatic microscopic hematuria 09/29/2017   Iron deficiency anemia 08/06/2017   Adnexal mass, RIGHT 12/16/2016   Menorrhagia 12/16/2016   Genetic testing 12/15/2016   Family history of breast cancer    Family history of ovarian cancer    Family history of neurofibromatosis    Acute deep vein thrombosis (DVT) of axillary vein of right upper extremity ([facility]) 11/26/2016   Type 2 diabetes mellitus with diabetic polyneuropathy, without long-term current use of insulin ([facility])    Low HDL (under 40) 09/16/2016   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 Denman George found that was not the case. Suspect this was originally an ovarian cystectomy   PORTACATH PLACEMENT Right 10/29/2016   Procedure: INSERTION PORT-A-CATH WITH [place];  Surgeon: [name], MD;  Location: [facility];  Service: General;  Laterality: Right;   portacath removal     oct. 5 2018   ROBOTIC ASSISTED TOTAL HYSTERECTOMY Bilateral 06/16/2017   Procedure: XI ROBOTIC ASSISTED TOTAL HYSTERECTOMY WITH BILATERAL SALPINGECTOMY,  OOPHERECTOMY;  Surgeon: [name], MD;  Location: [facility];  Service: Gynecology;  Laterality: Bilateral;     OB History   No obstetric  history on file.      Home Medications    Prior to Admission medications   Medication Sig Start Date End Date Taking? Authorizing Provider  albuterol (PROAIR HFA) 108 (90 Base) MCG/ACT inhaler Inhale 2 puffs into the lungs every 6 (six) hours as needed for wheezing or shortness of breath. 10/11/18  Yes Henson, Kelly L, NP-C  atorvastatin (LIPITOR) 20 MG tablet TAKE 1 TABLET BY MOUTH ONCE DAILY Patient taking differently: Take 20 mg by mouth daily at 6 PM.  04/14/18  Yes Henson, Kelly L, NP-C  Dulaglutide (TRULICITY) 1.5 NT/6.1WE SOPN Inject 1.5 mg into the skin once a week. 09/03/18  Yes Shamleffer, Melanie Crazier, MD  gabapentin (NEURONTIN) 300 MG capsule Take 1 capsule (300 mg total) by mouth at bedtime. 08/11/18  Yes Magrinat, Virgie Dad, MD  ibuprofen (ADVIL,MOTRIN) 200 MG tablet Take 400 mg by mouth daily as needed for headache or moderate pain.    Yes [provider]  Melatonin 5 MG TABS Take 10 mg by mouth at bedtime as needed (sleep).   Yes [provider]  metFORMIN (GLUCOPHAGE) 1000 MG tablet Take 1 tablet (1,000 mg total) by mouth 2 (two) times daily. 10/11/18  Yes Elayne Snare, MD  PREMARIN vaginal cream Place vaginally once a week. 08/11/18  Yes Magrinat, Virgie Dad, MD  venlafaxine XR (EFFEXOR-XR) 150 MG 24 hr capsule Take 1 capsule (150 mg total) by mouth daily with breakfast. 08/11/18  Yes Magrinat, Virgie Dad, MD  Continuous Blood Gluc Receiver (FREESTYLE LIBRE 14 DAY READER) DEVI 1 kit by Does not apply route every 14 (fourteen) days. 09/03/18   Shamleffer, Melanie Crazier, MD  Continuous Blood Gluc Sensor (FREESTYLE LIBRE 14 DAY SENSOR) MISC 1 kit by Does not apply route 3 (three) times daily. 09/03/18   Shamleffer, Melanie Crazier, MD  Glucose Blood (BLOOD GLUCOSE TEST STRIPS) STRP Test twice a day. Pt uses one touch verio flex meter 09/15/16   Henson, Kelly L, NP-C  ONETOUCH DELICA LANCETS FINE MISC Test twice a day 09/15/16   Kelly [facility], Kelly L, NP-C  predniSONE  (DELTASONE) 20 MG tablet Take 3 tablets (60 mg total) by mouth daily for 4 days. 10/14/18 10/18/18  Kelly Sites, DO    Family History Family History  Problem Relation Age of Onset   Breast cancer Mother 35   Hepatitis C Father    Ovarian cancer Maternal Aunt        dx in her 4s   Neurofibromatosis Maternal Uncle    Brain cancer Maternal Uncle 18   Lung cancer Maternal Grandfather    Kidney failure Paternal Grandmother    Heart attack Paternal Grandfather    Ovarian cancer Maternal Aunt        dx in her 66s   Neurofibromatosis Maternal Aunt    Ovarian cancer Maternal Aunt    Neurofibromatosis  Maternal Aunt    Cervical cancer Maternal Aunt    Neurofibromatosis Maternal Aunt    Cancer Maternal Aunt        cancer on the bottom of her foot   Breast cancer Other        MGF's sisters   Neurofibromatosis Other        MGM's paternal aunt    Social History Social History   Tobacco Use   Smoking status: Former Smoker    Packs/day: 0.15    Years: 25.00    Pack years: 3.75    Types: Cigarettes    Last attempt to quit: 11/25/2016    Years since quitting: 1.8   Smokeless tobacco: Never Used  Substance Use Topics   Alcohol use: Yes    Alcohol/week: 1.0 standard drinks    Types: 1 Glasses of wine per week    Comment: rarely    Drug use: No     Allergies   Lisinopril and Penicillins   Review of Systems Review of Systems  Constitutional: Negative for chills and fever.  HENT: Negative for ear pain and sore throat.   Eyes: Negative for pain and visual disturbance.  Respiratory: Positive for cough, shortness of breath and wheezing.   Cardiovascular: Positive for chest pain. Negative for palpitations.  Gastrointestinal: Negative for abdominal pain and vomiting.  Genitourinary: Negative for dysuria and hematuria.  Musculoskeletal: Negative for arthralgias and back pain.  Skin: Negative for color change and rash.  Neurological: Negative for seizures and  syncope.  All other systems reviewed and are negative.    Physical Exam Updated Vital Signs  ED Triage Vitals [10/14/18 1041]  Enc Vitals Group     BP (!) 150/119     Pulse Rate 95     Resp 16     Temp 98.3 F (36.8 C)     Temp Source Oral     SpO2 99 %     Weight      Height      Head Circumference      Peak Flow      Pain Score      Pain Loc      Pain Edu?      Excl. in [place]?     Physical Exam Vitals signs and nursing note reviewed.  Constitutional:      General: She is not in acute distress.    Appearance: She is well-developed. She is not ill-appearing.  HENT:     Head: Normocephalic and atraumatic.     Mouth/Throat:     Mouth: Mucous membranes are moist.  Eyes:     Extraocular Movements: Extraocular movements intact.     Conjunctiva/sclera: Conjunctivae normal.     Pupils: Pupils are equal, round, and reactive to light.  Neck:     Musculoskeletal: Neck supple.  Cardiovascular:     Rate and Rhythm: Normal rate and regular rhythm.     Pulses: Normal pulses.     Heart sounds: Normal heart sounds. No murmur.  Pulmonary:     Effort: Pulmonary effort is normal. No respiratory distress.     Breath sounds: Wheezing present. No decreased breath sounds, rhonchi or rales.  Abdominal:     Palpations: Abdomen is soft.     Tenderness: There is no abdominal tenderness.  Musculoskeletal:     Right lower leg: She exhibits tenderness (TTP to right tricep area).  Skin:    General: Skin is warm and dry.     Capillary Refill:  Capillary refill takes less than 2 seconds.     Comments: No obvious swelling to b/Mendez arms  Neurological:     General: No focal deficit present.     Mental Status: She is alert.      ED Treatments / Results  Labs (all labs ordered are listed, but only abnormal results are displayed) Labs Reviewed  BASIC METABOLIC PANEL - Abnormal; Notable for the following components:      Result Value   Glucose, Bld 142 (*)    All other components within  normal limits  CBC WITH DIFFERENTIAL/PLATELET  TROPONIN I  I-STAT BETA HCG BLOOD, ED ([facility], [facility], [facility] ONLY)    EKG EKG Interpretation  Date/Time:  Thursday October 14 2018 11:27:17 EDT Ventricular Rate:  88 PR Interval:    QRS Duration: 87 QT Interval:  358 QTC Calculation: 434 R Axis:   36 Text Interpretation:  Sinus rhythm RSR' in V1 or V2, right VCD or RVH Confirmed by [facility] [phone]) on 10/14/2018 11:30:34 AM   Radiology Dg Chest Portable 1 View  Result Date: 10/14/2018 CLINICAL DATA:  Increasing sob, weakness over last several days with new underarm pain, hx of asthmapain, hx of DVT in arm EXAM: PORTABLE CHEST 1 VIEW COMPARISON:  None. FINDINGS: Normal mediastinum and cardiac silhouette. Normal pulmonary vasculature. No evidence of effusion, infiltrate, or pneumothorax. No acute bony abnormality. Surgical clips overlying the medial RIGHT breast. IMPRESSION: No acute cardiopulmonary process. Electronically Signed   By: Suzy Bouchard M.D.   On: 10/14/2018 11:03   Ue Venous Duplex ([facility] And [facility] Only)  Result Date: 10/14/2018 UPPER VENOUS STUDY  Indications: Pain Performing Technologist: Abram Sander RVS  Examination Guidelines: A complete evaluation includes B-mode imaging, spectral Doppler, color Doppler, and power Doppler as needed of all accessible portions of each vessel. Bilateral testing is considered an integral part of a complete examination. Limited examinations for reoccurring indications may be performed as noted.  Right Findings: +----------+------------+---------+-----------+----------+-------+  RIGHT      Compressible Phasicity Spontaneous Properties Summary  +----------+------------+---------+-----------+----------+-------+  IJV            Full        Yes        Yes                         +----------+------------+---------+-----------+----------+-------+  Subclavian     Full        Yes        Yes                          +----------+------------+---------+-----------+----------+-------+  Axillary       Full        Yes        Yes                         +----------+------------+---------+-----------+----------+-------+  Brachial       Full        Yes        Yes                         +----------+------------+---------+-----------+----------+-------+  Radial         Full                                               +----------+------------+---------+-----------+----------+-------+  Ulnar          Full                                               +----------+------------+---------+-----------+----------+-------+  Cephalic       Full                                               +----------+------------+---------+-----------+----------+-------+  Basilic        Full                                               +----------+------------+---------+-----------+----------+-------+  Left Findings: +----------+------------+---------+-----------+----------+-------+  LEFT       Compressible Phasicity Spontaneous Properties Summary  +----------+------------+---------+-----------+----------+-------+  Subclavian     Full        Yes        Yes                         +----------+------------+---------+-----------+----------+-------+  Summary:  Right: No evidence of deep vein thrombosis in the upper extremity. No evidence of superficial vein thrombosis in the upper extremity.  Left: No evidence of thrombosis in the subclavian.  *See table(s) above for measurements and observations.    Preliminary     Procedures Procedures (including critical care time)  Medications Ordered in ED Medications  albuterol (PROVENTIL HFA;VENTOLIN HFA) 108 (90 Base) MCG/ACT inhaler 8 puff (8 puffs Inhalation Given 10/14/18 1137)  predniSONE (DELTASONE) tablet 60 mg (60 mg Oral Given 10/14/18 1137)     Initial Impression / Assessment and Plan / ED Course  I have reviewed the triage vital signs and the nursing notes.  Pertinent labs & imaging results that were  available during my care of the patient were reviewed by me and considered in my medical decision making (see chart for details).     Kelly [facility]endez is a 43 year old female with history of breast cancer, asthma who presents to the ED with shortness of breath, right upper extremity pain.  Patient with unremarkable vitals.  No fever.  Patient with pain in the right upper arm at her previous DVT site but she is noticed over the last several days.  Has had shortness of breath with chest tightness and cough.  Was called in asthma medications by primary care doctor via telemedicine but had not been able to pick it up due to not being able to afford her medications at this time.  She is wheezing on exam.  She appears to be in no respiratory distress.  Has some tenderness in the right tricep area but there is no obvious major swelling of bilateral arms.  EKG shows sinus rhythm.  No signs of ischemic changes.  Troponin normal.  Doubt cardiac process.  Chest x-ray showed no signs obvious pneumonia, pneumothorax, pleural effusion.  Lab work showed no significant anemia, electrolyte abnormality, kidney injury.  Suspect symptoms likely secondary to asthma exacerbation. PE unlikely given likely viral/asthma. No hypoxia, not tachy. In the setting of current coronavirus outbreak recommend the patient continued home quarantine which she has ready been  doing.  Given information about coronavirus.  Discharged from ED in good condition.  Given prescription for prednisone.  Given albuterol inhaler to use.  This chart was dictated using voice recognition software.  Despite best efforts to proofread,  errors can occur which can change the documentation meaning.   Effie Wahlert was evaluated in Emergency Department on 10/14/2018 for the symptoms described in the history of present illness. She was evaluated in the context of the global COVID-19 pandemic, which necessitated consideration that the patient  might be at risk for infection with the SARS-CoV-2 virus that causes COVID-19. Institutional protocols and algorithms that pertain to the evaluation of patients at risk for COVID-19 are in a state of rapid change based on information released by regulatory bodies including the CDC and federal and state organizations. These policies and algorithms were followed during the patient's care in the ED.   Final Clinical Impressions(s) / ED Diagnoses   Final diagnoses:  Mild asthma with exacerbation, unspecified whether persistent    ED Discharge Orders         Ordered    predniSONE (DELTASONE) 20 MG tablet  Daily     10/14/18 1315           Miller, DO 10/14/18 1318

## 2018-10-14 NOTE — ED Notes (Signed)
[place] MD to bedside to obtain blood from pt

## 2018-10-14 NOTE — ED Triage Notes (Signed)
Pt reports increased SHOB, cough, and chest pain with cough over the last few days. Pt has hx of asthma and does not have home medications at this time. Pt also reports pain in armpit region and reports that she is concerned she has another blood clot.

## 2018-10-14 NOTE — ED Notes (Signed)
Attempted to straight stick pt for blood. Unsuccessful. Will call phlebotomy to attempt

## 2018-10-14 NOTE — Progress Notes (Signed)
Right upper extremity venous has been completed.   Preliminary results in CV Proc.   Abram Sander 10/14/2018 11:20 AM

## 2018-10-14 NOTE — Discharge Instructions (Addendum)
Chest x-ray showed no lung infection.  All of your lab work today is normal.  You likely have an asthma exacerbation.  Continue home quarantine as discussed about possible coronavirus.  Information is below.   If you live with, or provide care at home for, a person confirmed to have, or being evaluated for, COVID-19 infection please follow these guidelines to prevent infection:  Follow healthcare providers instructions Make sure that you understand and can help the patient follow any healthcare provider instructions for all care.  Provide for the patients basic needs You should help the patient with basic needs in the home and provide support for getting groceries, prescriptions, and other personal needs.  Monitor the patients symptoms If they are getting sicker, call his or her medical provider a  This will help the healthcare providers office take steps to keep other people from getting infected. Ask the healthcare provider to call the local or state health department.  Limit the number of people who have contact with the patient If possible, have only one caregiver for the patient. Other household members should stay in another home or place of residence. If this is not possible, they should stay in another room, or be separated from the patient as much as possible. Use a separate bathroom, if available. Restrict visitors who do not have an essential need to be in the home.  Keep older adults, very young children, and other sick people away from the patient Keep older adults, very young children, and those who have compromised immune systems or chronic health conditions away from the patient. This includes people with chronic heart, lung, or kidney conditions, diabetes, and cancer.  Ensure good ventilation Make sure that shared spaces in the home have good air flow, such as from an air conditioner or an opened window, weather permitting.  Wash your hands often Wash your hands  often and thoroughly with soap and water for at least 20 seconds. You can use an alcohol based hand sanitizer if soap and water are not available and if your hands are not visibly dirty. Avoid touching your eyes, nose, and mouth with unwashed hands. Use disposable paper towels to dry your hands. If not available, use dedicated cloth towels and replace them when they become wet.  Wear a facemask and gloves Wear a disposable facemask at all times in the room and gloves when you touch or have contact with the patients blood, body fluids, and/or secretions or excretions, such as sweat, saliva, sputum, nasal mucus, vomit, urine, or feces.  Ensure the mask fits over your nose and mouth tightly, and do not touch it during use. Throw out disposable facemasks and gloves after using them. Do not reuse. Wash your hands immediately after removing your facemask and gloves. If your personal clothing becomes contaminated, carefully remove clothing and launder. Wash your hands after handling contaminated clothing. Place all used disposable facemasks, gloves, and other waste in a lined container before disposing them with other household waste. Remove gloves and wash your hands immediately after handling these items.  Do not share dishes, glasses, or other household items with the patient Avoid sharing household items. You should not share dishes, drinking glasses, cups, eating utensils, towels, bedding, or other items After the person uses these items, you should wash them thoroughly with soap and water.  Wash laundry thoroughly Immediately remove and wash clothes or bedding that have blood, body fluids, and/or secretions or excretions, such as sweat, saliva, sputum, nasal mucus, vomit, urine,  or feces, on them. Wear gloves when handling laundry from the patient. Read and follow directions on labels of laundry or clothing items and detergent. In general, wash and dry with the warmest temperatures recommended on  the label.  Clean all areas the individual has used often Clean all touchable surfaces, such as counters, tabletops, doorknobs, bathroom fixtures, toilets, phones, keyboards, tablets, and bedside tables, every day. Also, clean any surfaces that may have blood, body fluids, and/or secretions or excretions on them. Wear gloves when cleaning surfaces the patient has come in contact with. Use a diluted bleach solution (e.g., dilute bleach with 1 part bleach and 10 parts water) or a household disinfectant with a label that says EPA-registered for coronaviruses. To make a bleach solution at home, add 1 tablespoon of bleach to 1 quart (4 cups) of water. For a larger supply, add  cup of bleach to 1 gallon (16 cups) of water. Read labels of cleaning products and follow recommendations provided on product labels. Labels contain instructions for safe and effective use of the cleaning product including precautions you should take when applying the product, such as wearing gloves or eye protection and making sure you have good ventilation during use of the product. Remove gloves and wash hands immediately after cleaning.  Monitor yourself for signs and symptoms of illness Caregivers and household members are considered close contacts, should monitor their health, and will be asked to limit movement outside of the home to the extent possible. Follow the monitoring steps for close contacts listed on the symptom monitoring form.   ? If you have additional questions, contact your local health department or call the epidemiologist on call at [phone] (available 24/7). ? This guidance is subject to change. For the most up-to-date guidance from [facility], please refer to their website: YouBlogs.pl

## 2018-10-14 NOTE — ED Notes (Signed)
US at bedside

## 2018-10-14 NOTE — ED Notes (Signed)
Spoke with Ronnald Nian, MD who reports he will obtain blood sample.

## 2018-10-14 NOTE — ED Notes (Addendum)
Tech unable to locate vein for blood draw. Pt states she's a "hard stick" and that staff usually has trouble finding her veins. Right arm is restricted due to previous lymph node removal. RN aware.

## 2018-11-11 ENCOUNTER — Encounter: Payer: Self-pay | Admitting: Internal Medicine

## 2018-11-22 ENCOUNTER — Other Ambulatory Visit: Payer: Self-pay | Admitting: Family Medicine

## 2018-11-26 IMAGING — MG DIGITAL DIAGNOSTIC BILATERAL MAMMOGRAM WITH TOMO AND CAD
8 of 17 series · 8 of 40 positions shown · non-contrast
Comparison: Previous exam(s).

CLINICAL DATA: Patient with history of right breast lumpectomy.

EXAM:
DIGITAL DIAGNOSTIC BILATERAL MAMMOGRAM WITH CAD AND TOMO
ULTRASOUND RIGHT BREAST

[R CC]
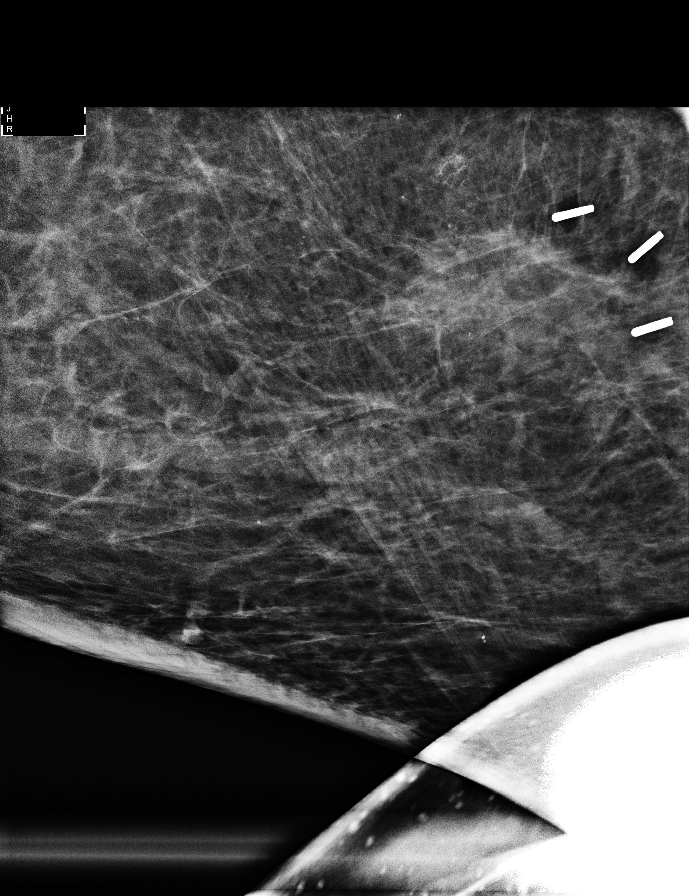

[R ML (1 of 2)]
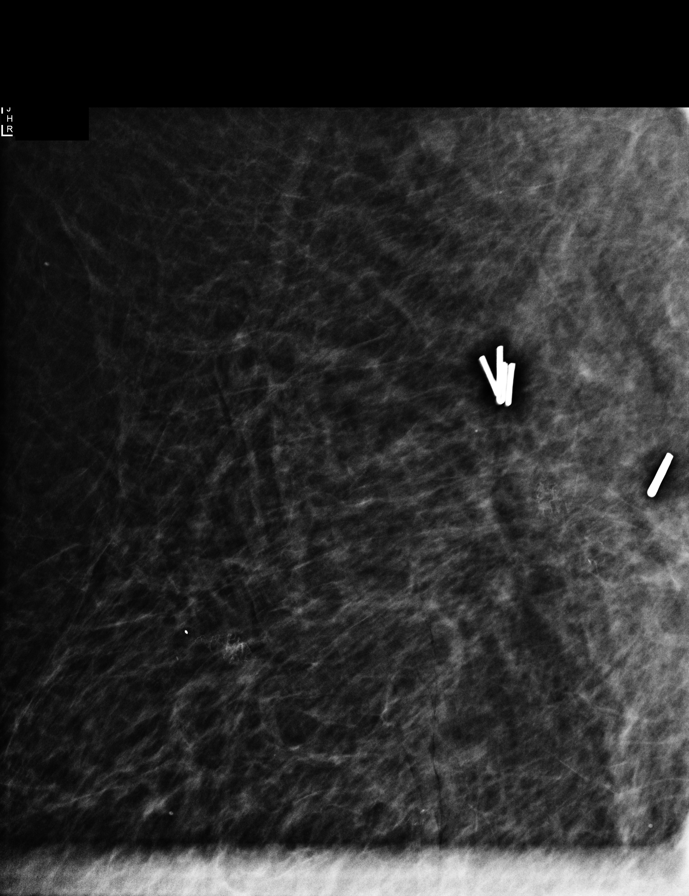

[R MLO]
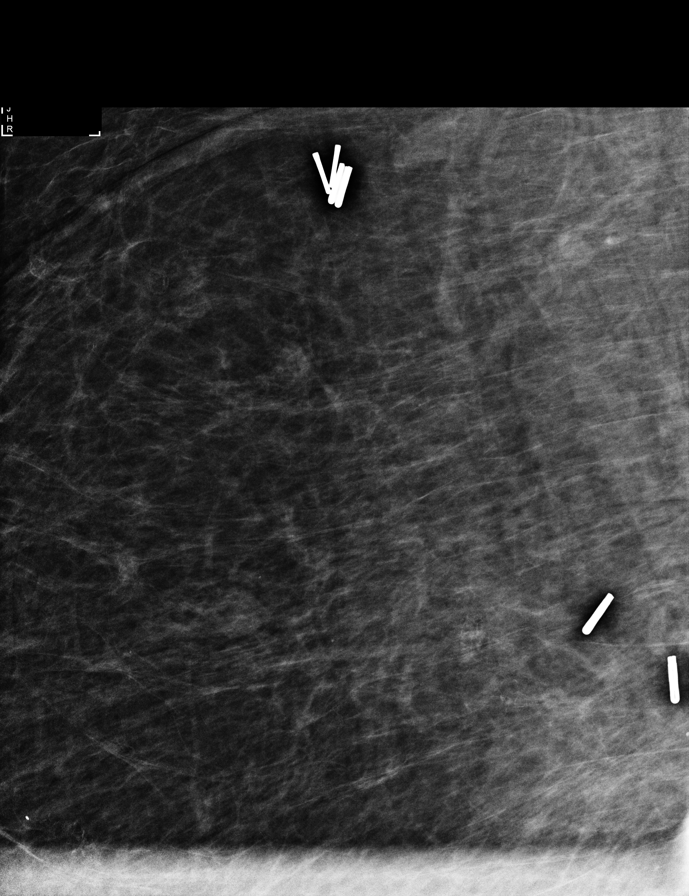

[R ML (2 of 2)]
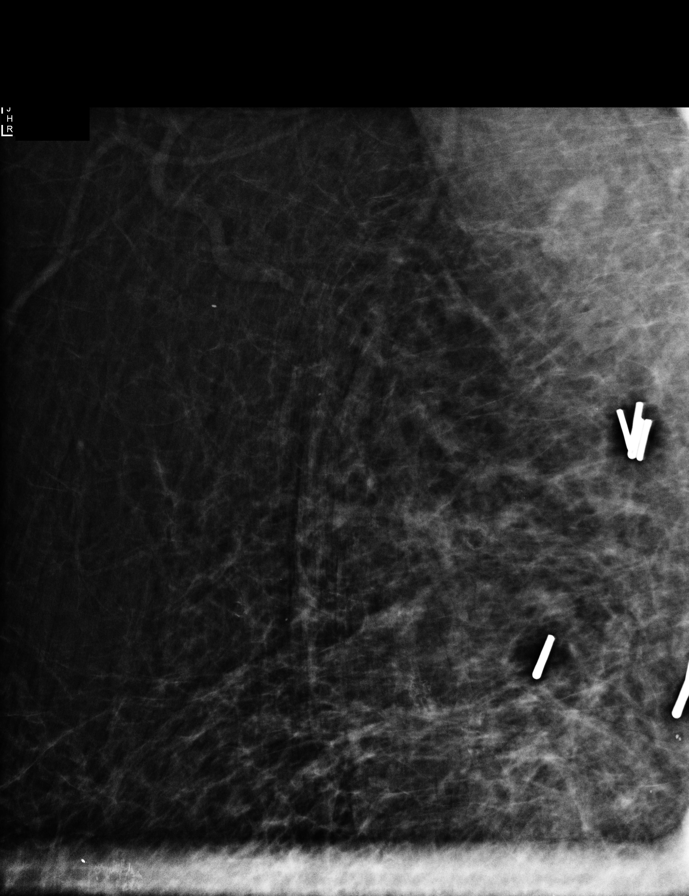

[R CC synth-2D (1 of 2)]
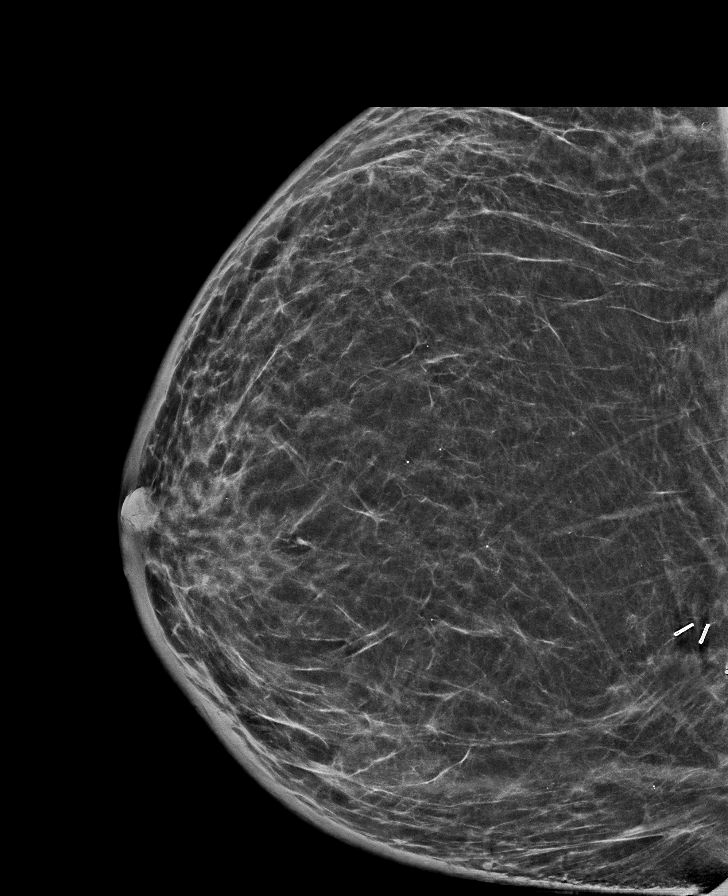

[R MLO synth-2D]
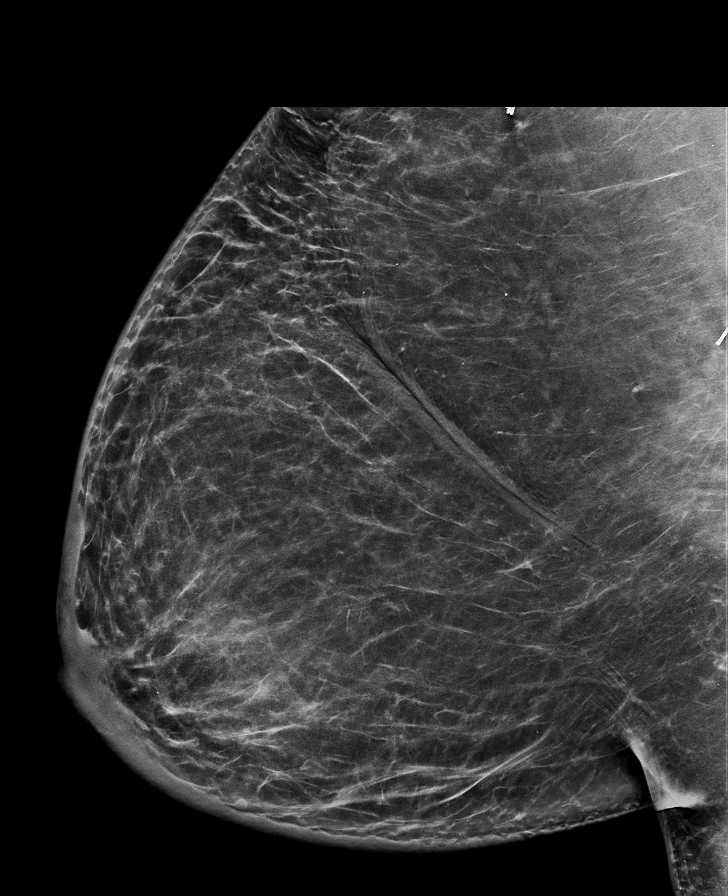

[R CC synth-2D (2 of 2)]
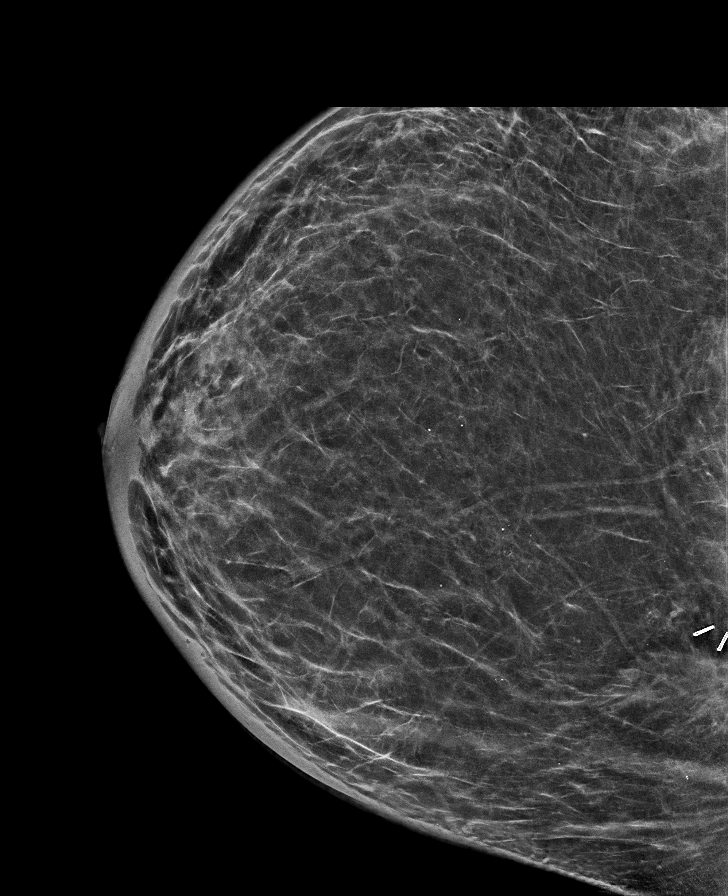

[L MLO synth-2D]
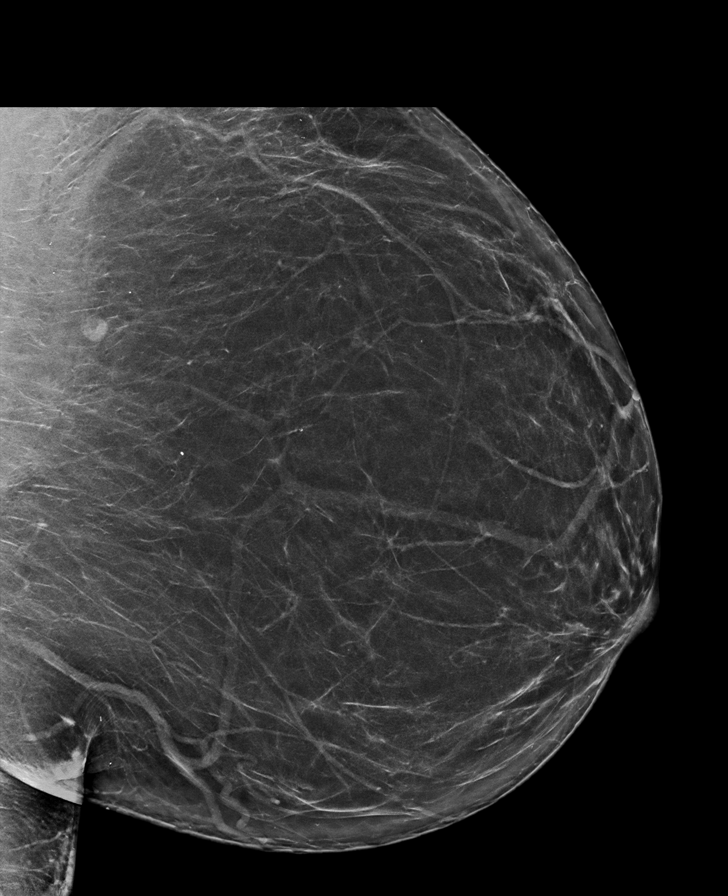

[8 of 40 positions shown; findings below may reference images not displayed]

ACR Breast Density Category b: There are scattered areas of
fibroglandular density.
FINDINGS: Interval postlumpectomy changes right breast. Anterolateral to the
lumpectomy site are 2 groups of new likely early dystrophic
calcifications, potentially representing fat necrosis. Additionally
on the MLO view of the right breast there is a small low-attenuation
nodule within the upper-outer right breast posterior depth. No
additional concerning masses, calcifications or distortion
identified within either breast.

Mammographic images were processed with CAD.

Targeted ultrasound is performed, showing a 6 x 3 x 7 mm
intramammary lymph node right breast 9 o'clock position 14 cm from
nipple. This does not likely correspond with the mammographically
identified asymmetry. No additional mass identified within the
upper-outer right breast on ultrasound.
IMPRESSION: 1. Probably benign right breast calcifications near the lumpectomy
site, favored to represent fat necrosis.
2. Probably benign low-attenuation oval circumscribed mass within
the upper-outer right breast middle to posterior depth, demonstrated
on the MLO view.

RECOMMENDATION:
Right breast diagnostic mammography and possible ultrasound in 6
months to ensure stability of probably benign right breast mass and
right breast calcifications.

I have discussed the findings and recommendations with the patient.
Results were also provided in writing at the conclusion of the
visit. If applicable, a reminder letter will be sent to the patient
regarding the next appointment.

BI-RADS CATEGORY  3: Probably benign.

## 2018-12-02 ENCOUNTER — Ambulatory Visit (INDEPENDENT_AMBULATORY_CARE_PROVIDER_SITE_OTHER): Payer: Managed Care, Other (non HMO) | Admitting: Internal Medicine

## 2018-12-02 ENCOUNTER — Other Ambulatory Visit: Payer: Self-pay

## 2018-12-02 ENCOUNTER — Encounter: Payer: Self-pay | Admitting: Internal Medicine

## 2018-12-02 DIAGNOSIS — E1142 Type 2 diabetes mellitus with diabetic polyneuropathy: Secondary | ICD-10-CM | POA: Diagnosis not present

## 2018-12-02 NOTE — Progress Notes (Signed)
Virtual Visit via Video Note  I connected with Kelly Mendez on 12/02/18 at  1:00 PM EDT by a video enabled telemedicine application and verified that I am speaking with the correct person using two identifiers.   I discussed the limitations of evaluation and management by telemedicine and the availability of in person appointments. The patient expressed understanding and agreed to proceed.   -Location of the patient : Home  -Location of the provider : Office -The names of all persons participating in the telemedicine service : Pt and myself        Name: Kelly Mendez  Age/ Sex: 43 y.o., female   MRN/ DOB: [id number], February 03, 1976     PCP: [name], NP-C   Reason for Endocrinology Evaluation: Type 2 Diabetes Mellitus     Initial Endocrinology Clinic Visit: 09/03/2018    PATIENT IDENTIFIER: Kelly Mendez is a 43 y.o. female with a past medical history of Right breast ca (03/19/2017 S/P lumpectomy, chemo and radiation) and T2DM. The patient has followed with Endocrinology clinic since 09/03/2018 for consultative assistance with management of her diabetes.  DIABETIC HISTORY:  Kelly Mendez was diagnosed with T2DM in 2018. She has been on metformin since diagnosis, trulicity added in 3704 , she has not been on insulin. Her hemoglobin A1c has ranged from 6.7% in 2019, peaking at 8.8% in 2018.    SUBJECTIVE:   During the last visit (09/03/2018): A1c 7.0% . We continued metformin and increased trulicity.   Today (12/02/2018): Kelly Mendez is here for a virtual 3 month follow up on her diabetes management.  She checks her blood sugars multiple times a day through freestyle libre. The patient has not had hypoglycemic episodes since the last clinic visit. Otherwise, the patient has not required any recent emergency interventions for hypoglycemia and has not had recent hospitalizations secondary to hyper or hypoglycemic episodes.    ROS: As per  HPI and as detailed below: Review of Systems  Constitutional: Negative for chills and fever.  HENT: Negative for congestion and sore throat.   Respiratory: Negative for cough and shortness of breath.   Cardiovascular: Negative for chest pain and palpitations.  Gastrointestinal: Negative for diarrhea and nausea.      HOME DIABETES REGIMEN:   Metformin 1000 mg twice daily  Trulicity to 1.5 mg weekly (Saturday)   Statin: yes ACE-I/ARB: no    GLUCOSE LOG:  Date  Fasting   12/02/2018 112  5/20 117  5/19 115     DIABETIC COMPLICATIONS: Microvascular complications:   Neuropathy   Denies: CKD, retinopathy  Last eye exam: Completed 2018  Macrovascular complications:    Denies: CAD, PVD, CVA    HISTORY:  Past Medical History:  Past Medical History:  Diagnosis Date  . Abnormal Pap smear of cervix   . Anxiety   . Asthma   . Asymptomatic microscopic hematuria 09/29/2017  . 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 Concussion    around age 2  . Diabetes type 2, uncontrolled ([place])    new diagnosis in 08/2016  . DVT (deep venous thrombosis) ([facility]) 11/24/2016   in rt arm  . Family history of breast cancer   . Family history of neurofibromatosis   . Family history of ovarian cancer   . History of radiation therapy 04/21/17- 05/20/17   Right Breast 40.05 Gy in 15 fractions followed by a 10 Gy boost in  5 fractions to yield a total dose of 50.05 Gy  . HPV in female   . Hyperlipemia   . Malignant neoplasm of upper-inner quadrant of right breast in female, estrogen receptor negative ([place]) 10/21/2016  . Neuromuscular disorder (HCC)    neuropathy  . Pelvic mass in female 06/16/2017  . Personal history of chemotherapy 2018  . Personal history of radiation therapy 2018  . Pneumonia    2014   . PTSD (post-traumatic stress disorder)     Past Surgical History:  Past Surgical History:  Procedure Laterality Date  . BREAST LUMPECTOMY Right  03/18/2017  . BREAST LUMPECTOMY WITH RADIOACTIVE SEED AND SENTINEL LYMPH NODE BIOPSY Right 03/18/2017   Procedure: RIGHT BREAST LUMPECTOMY WITH RADIOACTIVE SEED AND RIGHT SENTINEL LYMPH NODE BIOPSY;  Surgeon: [name], MD;  Location: [facility];  Service: General;  Laterality: Right;  . COLPOSCOPY    . left ovary removed  1978   was told her ovary was removed but Dr. Denman George found that was not the case. Suspect this was originally an ovarian cystectomy  . PORTACATH PLACEMENT Right 10/29/2016   Procedure: INSERTION PORT-A-CATH WITH [place];  Surgeon: [name], MD;  Location: [facility];  Service: General;  Laterality: Right;  . portacath removal     oct. 5 2018  . ROBOTIC ASSISTED TOTAL HYSTERECTOMY Bilateral 06/16/2017   Procedure: XI ROBOTIC ASSISTED TOTAL HYSTERECTOMY WITH BILATERAL SALPINGECTOMY,  OOPHERECTOMY;  Surgeon: [name], MD;  Location: [facility];  Service: Gynecology;  Laterality: Bilateral;     Social History:  reports that she quit smoking about 2 years ago. Her smoking use included cigarettes. She has a 3.75 pack-year smoking history. She has never used smokeless tobacco. She reports current alcohol use of about 1.0 standard drinks of alcohol per week. She reports that she does not use drugs. Family History:  Family History  Problem Relation Age of Onset  . Breast cancer Mother 5  . Hepatitis C Father   . Ovarian cancer Maternal Aunt        dx in her 47s  . Neurofibromatosis Maternal Uncle   . Brain cancer Maternal Uncle 58  . Lung cancer Maternal Grandfather   . Kidney failure Paternal Grandmother   . Heart attack Paternal Grandfather   . Ovarian cancer Maternal Aunt        dx in her 21s  . Neurofibromatosis Maternal Aunt   . Ovarian cancer Maternal Aunt   . Neurofibromatosis Maternal Aunt   . Cervical cancer Maternal Aunt   . Neurofibromatosis Maternal Aunt   . Cancer Maternal Aunt        cancer on the bottom of her foot  . Breast cancer Other         MGF's sisters  . Neurofibromatosis Other        MGM's paternal aunt      HOME MEDICATIONS: Allergies as of 12/02/2018      Reactions   Lisinopril    Cough, itchy throat   Penicillins Other (See Comments)   As a child Has patient had a PCN reaction causing immediate rash, facial/tongue/throat swelling, SOB or lightheadedness with hypotension: unknown Has patient had a PCN reaction causing severe rash involving mucus membranes or skin necrosis: unknown Has patient had a PCN reaction that required hospitalization unknown Has patient had a PCN reaction occurring within the last 10 years: no If all of the above answers are "NO", then may proceed with Cephalosporin use.  Medication List       Accurate as of Dec 02, 2018 11:05 AM. If you have any questions, ask your nurse or doctor.        albuterol 108 (90 Base) MCG/ACT inhaler Commonly known as:  ProAir HFA Inhale 2 puffs into the lungs every 6 (six) hours as needed for wheezing or shortness of breath.   atorvastatin 20 MG tablet Commonly known as:  LIPITOR Take 1 tablet by mouth once daily   BLOOD GLUCOSE TEST STRIPS Strp Test twice a day. Pt uses one touch verio flex meter   Dulaglutide 1.5 MG/0.5ML Sopn Commonly known as:  Trulicity Inject 1.5 mg into the skin once a week.   FreeStyle Libre 14 Day Reader Kerrin Mo 1 kit by Does not apply route every 14 (fourteen) days.   FreeStyle Libre 14 Day Sensor Misc 1 kit by Does not apply route 3 (three) times daily.   gabapentin 300 MG capsule Commonly known as:  NEURONTIN Take 1 capsule (300 mg total) by mouth at bedtime.   ibuprofen 200 MG tablet Commonly known as:  ADVIL Take 400 mg by mouth daily as needed for headache or moderate pain.   Melatonin 5 MG Tabs Take 10 mg by mouth at bedtime as needed (sleep).   metFORMIN 1000 MG tablet Commonly known as:  GLUCOPHAGE Take 1 tablet (1,000 mg total) by mouth 2 (two) times daily.   OneTouch Delica Lancets Fine Misc  Test twice a day   Premarin vaginal cream Generic drug:  conjugated estrogens Place vaginally once a week.   venlafaxine XR 150 MG 24 hr capsule Commonly known as:  EFFEXOR-XR Take 1 capsule (150 mg total) by mouth daily with breakfast.         DATA REVIEWED:  Lab Results  Component Value Date   HGBA1C 7.0 (A) 08/13/2018   HGBA1C 6.7 (A) 01/12/2018   HGBA1C 6.4% 09/28/2017   Lab Results  Component Value Date   MICROALBUR 4.5 09/15/2016   LDLCALC 47 08/13/2018   CREATININE 0.75 10/14/2018   Lab Results  Component Value Date   MICRALBCREAT 19 09/15/2016     Lab Results  Component Value Date   CHOL 91 (L) 08/13/2018   HDL 28 (L) 08/13/2018   LDLCALC 47 08/13/2018   TRIG 81 08/13/2018   CHOLHDL 3.3 08/13/2018         ASSESSMENT / PLAN / RECOMMENDATIONS:   1) Type 2 Diabetes Mellitus, optimally controlled, With neuropathic complications - Most recent A1c of 7.0 %. Goal A1c <7.0 %.  Plan: - Her BG's have been optimal . She his happy with her CGM's. We discussed the importance of confirming low BG's with a finger stick , to avoid the need for unnecessary correction. - Encouraged pt to continue with lifestyle changes  - No changes will be made at this time     MEDICATIONS:  Metformin 6270 mg BID   Trulicity 1.5 mg weekly  EDUCATION / INSTRUCTIONS:  BG monitoring instructions: Patient is instructed to check her blood sugars 2 times a day, fasting and bedtime.  Call [facility] if: BG persistently < 70 or > 300. . I reviewed the Rule of 15 for the treatment of hypoglycemia in detail with the patient. Literature supplied.     2) Diabetic complications:   Eye: Does not have known diabetic retinopathy.   Neuro/ Feet: Does have known diabetic peripheral neuropathy .   Renal: Patient does not have known baseline CKD. She  is not on an ACEI/ARB at present.   3) Lipids: Patient is on a statin.      I discussed the assessment and  treatment plan with the patient. The patient was provided an opportunity to ask questions and all were answered. The patient agreed with the plan and demonstrated an understanding of the instructions.   The patient was advised to call back or seek an in-person evaluation if the symptoms worsen or if the condition fails to improve as anticipated.   F/U in 3 months    Signed electronically by: Mack Guise, MD  [facility]  [facility] [address] [place], [place] [address] Phone: [phone] FAX: [phone]   CC: [name], NP-C [name] [address] Phone: [phone]  Fax: [phone]  Return to Endocrinology clinic as below: Future Appointments  Date Time Provider [place]  12/02/2018  1:00 PM Yoshiye Kraft, Melanie Crazier, MD [facility] None  05/12/2019  1:30 PM [facility] 3 [facility] None  05/12/2019  2:00 PM Magrinat, Virgie Dad, MD [facility] None

## 2019-02-10 ENCOUNTER — Encounter: Payer: Self-pay | Admitting: Family Medicine

## 2019-02-21 ENCOUNTER — Telehealth: Payer: Self-pay | Admitting: Family Medicine

## 2019-02-21 ENCOUNTER — Other Ambulatory Visit: Payer: Self-pay | Admitting: Endocrinology

## 2019-02-21 MED ORDER — ATORVASTATIN CALCIUM 20 MG PO TABS: 20.0000 mg | ORAL_TABLET | Freq: Every day | ORAL | 0 refills | Status: DC

## 2019-02-21 NOTE — Telephone Encounter (Signed)
Okay to refill Lipitor for 90 days and then she will need a visit

## 2019-02-21 NOTE — Telephone Encounter (Signed)
Received a call from [place]. Pt needs refill of lipitor.

## 2019-02-22 MED ORDER — ATORVASTATIN CALCIUM 20 MG PO TABS: 20.0000 mg | ORAL_TABLET | Freq: Every day | ORAL | 0 refills | Status: DC

## 2019-02-22 NOTE — Telephone Encounter (Signed)
Lmom asking patient to call and schedule her visit for future refills

## 2019-02-22 NOTE — Telephone Encounter (Signed)
atorvastatin needed to be sent to [facility].

## 2019-02-22 NOTE — Addendum Note (Signed)
Addended by: Edgar Frisk on: 02/22/2019 01:00 PM   Modules accepted: Orders

## 2019-03-04 ENCOUNTER — Ambulatory Visit: Payer: Managed Care, Other (non HMO) | Admitting: Internal Medicine

## 2019-04-02 ENCOUNTER — Other Ambulatory Visit: Payer: Self-pay | Admitting: Family Medicine

## 2019-04-02 ENCOUNTER — Other Ambulatory Visit: Payer: Self-pay | Admitting: Endocrinology

## 2019-04-02 ENCOUNTER — Other Ambulatory Visit: Payer: Self-pay | Admitting: Oncology

## 2019-04-04 ENCOUNTER — Other Ambulatory Visit: Payer: Self-pay | Admitting: Oncology

## 2019-04-04 ENCOUNTER — Telehealth: Payer: Self-pay | Admitting: Internal Medicine

## 2019-04-04 ENCOUNTER — Other Ambulatory Visit: Payer: Self-pay

## 2019-04-04 MED ORDER — METFORMIN HCL 1000 MG PO TABS: 1000.0000 mg | ORAL_TABLET | Freq: Two times a day (BID) | ORAL | 0 refills | Status: DC

## 2019-04-04 NOTE — Telephone Encounter (Signed)
lft vm for pt to return call to verify pharmacy(pt has 4 in her chart)

## 2019-04-04 NOTE — Telephone Encounter (Signed)
Already documented in pt chart, yes

## 2019-04-04 NOTE — Telephone Encounter (Signed)
I believe this was meant for Dr. Kelton Pillar.

## 2019-04-04 NOTE — Telephone Encounter (Signed)
Pt has an appt this week and was refilled 8/11 #90

## 2019-04-04 NOTE — Telephone Encounter (Signed)
Patient states that Kelly Mendez called. Please return call.  Thanks

## 2019-04-04 NOTE — Telephone Encounter (Signed)
Spoke to pt and verified pharmacy also changed it in her chart and sent requested refill

## 2019-04-08 ENCOUNTER — Ambulatory Visit: Payer: Managed Care, Other (non HMO) | Admitting: Family Medicine

## 2019-04-14 ENCOUNTER — Telehealth: Payer: Self-pay | Admitting: Oncology

## 2019-04-14 NOTE — Telephone Encounter (Signed)
GM PAL 10/29 moved f/u from GM to [facility]. Confirmed with patient.

## 2019-05-10 ENCOUNTER — Telehealth: Payer: Self-pay

## 2019-05-10 NOTE — Telephone Encounter (Signed)
Called in needing PA for : Continuous Blood Gluc Receiver (FREESTYLE LIBRE 14 DAY READER) DEVI  Dulaglutide (TRULICITY) 1.5 [phone] SOPN  Please review and advise

## 2019-05-11 ENCOUNTER — Other Ambulatory Visit: Payer: Self-pay

## 2019-05-11 MED ORDER — FREESTYLE LIBRE 14 DAY SENSOR MISC: 1.0000 | Freq: Three times a day (TID) | 0 refills | Status: DC

## 2019-05-11 MED ORDER — TRULICITY 1.5 MG/0.5ML ~~LOC~~ SOAJ: 1.5000 mg | SUBCUTANEOUS | 0 refills | Status: DC

## 2019-05-11 NOTE — Telephone Encounter (Signed)
Rx sent 

## 2019-05-12 ENCOUNTER — Encounter: Payer: Self-pay | Admitting: Adult Health

## 2019-05-12 ENCOUNTER — Inpatient Hospital Stay: Payer: Medicaid Other | Attending: Adult Health

## 2019-05-12 ENCOUNTER — Inpatient Hospital Stay (HOSPITAL_BASED_OUTPATIENT_CLINIC_OR_DEPARTMENT_OTHER): Payer: Self-pay | Admitting: Adult Health

## 2019-05-12 ENCOUNTER — Telehealth: Payer: Self-pay | Admitting: Internal Medicine

## 2019-05-12 ENCOUNTER — Other Ambulatory Visit: Payer: Self-pay

## 2019-05-12 VITALS — BP 155/96 | HR 90 | Temp 98.5°F | Resp 18 | Wt 359.8 lb

## 2019-05-12 DIAGNOSIS — M898X9 Other specified disorders of bone, unspecified site: Secondary | ICD-10-CM | POA: Insufficient documentation

## 2019-05-12 DIAGNOSIS — G478 Other sleep disorders: Secondary | ICD-10-CM | POA: Insufficient documentation

## 2019-05-12 DIAGNOSIS — Z87891 Personal history of nicotine dependence: Secondary | ICD-10-CM | POA: Insufficient documentation

## 2019-05-12 DIAGNOSIS — C50211 Malignant neoplasm of upper-inner quadrant of right female breast: Secondary | ICD-10-CM

## 2019-05-12 DIAGNOSIS — Z171 Estrogen receptor negative status [ER-]: Secondary | ICD-10-CM | POA: Insufficient documentation

## 2019-05-12 DIAGNOSIS — Z86718 Personal history of other venous thrombosis and embolism: Secondary | ICD-10-CM | POA: Insufficient documentation

## 2019-05-12 DIAGNOSIS — E119 Type 2 diabetes mellitus without complications: Secondary | ICD-10-CM | POA: Insufficient documentation

## 2019-05-12 LAB — CBC WITH DIFFERENTIAL/PLATELET
Abs Immature Granulocytes: 0.03 10*3/uL (ref 0.00–0.07)
Basophils Absolute: 0 10*3/uL (ref 0.0–0.1)
Basophils Relative: 0 %
Eosinophils Absolute: 0.1 10*3/uL (ref 0.0–0.5)
Eosinophils Relative: 2 %
HCT: 38.8 % (ref 36.0–46.0)
Hemoglobin: 12.5 g/dL (ref 12.0–15.0)
Immature Granulocytes: 1 %
Lymphocytes Relative: 22 %
Lymphs Abs: 1.3 10*3/uL (ref 0.7–4.0)
MCH: 26.2 pg (ref 26.0–34.0)
MCHC: 32.2 g/dL (ref 30.0–36.0)
MCV: 81.3 fL (ref 80.0–100.0)
Monocytes Absolute: 0.3 10*3/uL (ref 0.1–1.0)
Monocytes Relative: 5 %
Neutro Abs: 4 10*3/uL (ref 1.7–7.7)
Neutrophils Relative %: 70 %
Platelets: 284 10*3/uL (ref 150–400)
RBC: 4.77 MIL/uL (ref 3.87–5.11)
RDW: 14.2 % (ref 11.5–15.5)
WBC: 5.8 10*3/uL (ref 4.0–10.5)
nRBC: 0 % (ref 0.0–0.2)

## 2019-05-12 LAB — COMPREHENSIVE METABOLIC PANEL
ALT: 50 U/L — ABNORMAL HIGH (ref 0–44)
AST: 28 U/L (ref 15–41)
Albumin: 3.4 g/dL — ABNORMAL LOW (ref 3.5–5.0)
Alkaline Phosphatase: 125 U/L (ref 38–126)
Anion gap: 8 (ref 5–15)
BUN: 10 mg/dL (ref 6–20)
CO2: 26 mmol/L (ref 22–32)
Calcium: 9.4 mg/dL (ref 8.9–10.3)
Chloride: 103 mmol/L (ref 98–111)
Creatinine, Ser: 0.76 mg/dL (ref 0.44–1.00)
GFR calc Af Amer: >60 mL/min (ref >60)
GFR calc non Af Amer: >60 mL/min (ref >60)
Glucose, Bld: 214 mg/dL — ABNORMAL HIGH (ref 70–99)
Potassium: 4.3 mmol/L (ref 3.5–5.1)
Sodium: 137 mmol/L (ref 135–145)
Total Bilirubin: 0.3 mg/dL (ref 0.3–1.2)
Total Protein: 6.7 g/dL (ref 6.5–8.1)

## 2019-05-12 NOTE — Patient Instructions (Signed)
Bone Scan A bone scan is an imaging test that is used to diagnose bone problems. You may have this test to check for:  Bone cancer or cancer that has spread to the bone.  A broken or cracked (fractured) bone.  Bone infection.  A cause of bone pain.  Certain other bone diseases. During this test, a Aubry amount of a radioactive substance (radiotracer) is injected into a vein in your arm. Your bones will absorb the radiotracer for a short time. The radiotracer gives off energy. A type of camera that makes images of your bones (scintigrams) will capture this energy on images. Abnormal bone changes take up too much or too little of the radiotracer. This will show up in the scintigrams. Tell a health care provider about:  Any allergies you have, including any previous reactions you have had to radiotracer or contrast material.  All medicines you are taking, including vitamins, herbs, eye drops, creams, and over-the-counter medicines.  Any blood disorders you have.  Any surgeries you have had.  Any medical conditions you have.  Whether you are pregnant, breastfeeding, or you think that you may be pregnant. What are the risks? Generally, this is a safe procedure. However, problems may occur, including:  Exposure to a Fell amount of radiation, which can very slightly increase your cancer risk.  Bleeding at the IV site.  Infection at the IV site. This is rare.  Allergic reaction to the radiotracer. Severe reactions may cause a rash or breathing trouble. These reactions are rare.  Nausea or vomiting caused by the radiotracer. What happens before the procedure?  Ask your health care provider about changing or stopping your regular medicines. This is especially important if you are taking diabetes medicines or blood thinners.  Starting 4 days before your scan: ? Do not take any medicines that contain bismuth. ? Do not have X-rays that use barium contrast material.  Starting 4 hours  before the scan, limit how much fluid you drink, as recommended by your health care provider. At the beginning of the scan, you will need to drink several glasses of water.  Do not wear metallic jewelry to the scan. What happens during the procedure?   An IV will be inserted into one of your veins.  You will lie down on an exam table.  The radiotracer will be injected through your IV. You may feel a cold sensation in your arm as the radiotracer enters your bloodstream.  Some images may be taken right after the injection.  You will drink 6-8 glasses of water. This flushes the excess tracer out of your system.  You may have to wait several hours for your bones to absorb the radiotracer.  You will lie back down on the exam table for more images. ? The camera may move around your body. ? You may be asked to stay still or to change your position. The procedure may vary among health care providers and hospitals. What happens after the procedure?   You may have to wait until a specialist (radiologist) checks the images to make sure that they are readable. If the images are not readable, more images may be taken.  You will be monitored to make sure that you do not have a reaction to the procedure or the radiotracer.  It is up to you to get your scan results. Ask your health care provider, or the department that is doing the scan, when your results will be ready.  Drink enough fluid  to keep your urine pale yellow. This will continue to flush the radiotracer out of your body. Summary  A bone scan is an imaging test that is used to diagnose bone problems.  During this test, a Robbins amount of a radioactive substance (radiotracer) is injected into your blood.  Abnormal bones take up too much or too little of the radiotracer. This will show up on the images taken during the scan.  Ask your health care provider, or the department that is doing the scan, when your results will be ready. This  information is not intended to replace advice given to you by your health care provider. Make sure you discuss any questions you have with your health care provider. Document Released: 06/27/2000 Document Revised: 05/04/2017 Document Reviewed: 05/04/2017 Elsevier Patient Education  2020 [employer].

## 2019-05-12 NOTE — Progress Notes (Signed)
[facility]  [phone] [phone]) [phone]     ID: [name] DOB: 1975/10/24  MR#: [id number]  KKX#:[id number]  Patient Care Team: [name], NP-C as PCP - General (Family Medicine) [name], MD as Consulting Physician (General Surgery) Magrinat, Virgie Dad, MD as Consulting Physician (Oncology) Eppie Gibson, MD as Attending Physician (Radiation Oncology) Buena Irish, LCSW as Social Worker (Licensed Clinical Social Worker) [name], MD as Consulting Physician (Gynecologic Oncology) OTHER MD:   CHIEF COMPLAINT: Triple negative breast cancer; DVT  CURRENT TREATMENT: Observation   BREAST CANCER HISTORY: From the original intake note:  Angala had her first ever mammogram 10/07/2016 at the [facility]. This showed a possible mass in the right breast. On 10/13/2016 she underwent bilateral diagnostic mammography with tomography and right breast ultrasonography. This found the breast density to be category B. In the upper inner quadrant of the right breast there was a circumscribed mass which was barely palpable. Ultrasonography confirmed a 0.9 cm right breast mass at the 1:00 radiant 18 cm from the nipple ultrasound of the axilla showed 1 indeterminate right axillary lymph node with borderline thickening of the anterior cortex.  On 10/16/2016 the patient underwent biopsy of the right breast mass and the suspicious right axillary lymph node. The right breast mass proved to be an invasive ductal carcinoma, grade 3, estrogen and progesterone receptor negative, with an MIB-1 of 80%, and no HER-2 amplification, the signals ratio being 1.30 and the number per cell 1.75. The lymph node was negative and concordant.  Her subsequent history is as detailed below   INTERVAL HISTORY: Reola returns today for follow-up and treatment of her triple negative breast cancer, as well as her DVT.   Ngan has been having issues with  difficulty sleeping.  This is becoming more persistent.  She is having a difficult time falling asleep and staying asleep, along with fatigue.  She is taking increasing amounts of ibuprofen.  The pain she experiences is in her bones.  She says she has a throbbing pain in her joints and bones.  The inconsistency with pain and sleeping has led to decreased amounts of compliance with her diabetes medications.  She is seeing her PCP tomorrow about this.    REVIEW OF SYSTEMS: Iness denies any fever, chills, chest pain, cough, shortness of breath, nausea, vomiting, bowel/bladder changes, headaches, vision issues.  A detailed ROS was otherwise non contributory.     PAST MEDICAL HISTORY: Past Medical History:  Diagnosis Date  . Abnormal Pap smear of cervix   . Anxiety   . Asthma   . Asymptomatic microscopic hematuria 09/29/2017  . 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 Concussion    around age 43  . Diabetes type 2, uncontrolled ([place])    new diagnosis in 08/2016  . DVT (deep venous thrombosis) ([place]) 11/24/2016   in rt arm  . Family history of breast cancer   . Family history of neurofibromatosis   . Family history of ovarian cancer   . History of radiation therapy 04/21/17- 05/20/17   Right Breast 40.05 Gy in 15 fractions followed by a 10 Gy boost in 5 fractions to yield a total dose of 50.05 Gy  . HPV in female   . Hyperlipemia   . Malignant neoplasm of upper-inner quadrant of right breast in female, estrogen receptor negative ([place]) 10/21/2016  . Neuromuscular disorder (HCC)    neuropathy  .  Pelvic mass in female 06/16/2017  . Personal history of chemotherapy 2018  . Personal history of radiation therapy 2018  . Pneumonia    2014   . PTSD (post-traumatic stress disorder)     PAST SURGICAL HISTORY: Past Surgical History:  Procedure Laterality Date  . BREAST LUMPECTOMY Right 03/18/2017  . BREAST LUMPECTOMY WITH RADIOACTIVE SEED AND SENTINEL LYMPH NODE BIOPSY  Right 03/18/2017   Procedure: RIGHT BREAST LUMPECTOMY WITH RADIOACTIVE SEED AND RIGHT SENTINEL LYMPH NODE BIOPSY;  Surgeon: [name], MD;  Location: [facility];  Service: General;  Laterality: Right;  . COLPOSCOPY    . left ovary removed  1978   was told her ovary was removed but Dr. Denman George found that was not the case. Suspect this was originally an ovarian cystectomy  . PORTACATH PLACEMENT Right 10/29/2016   Procedure: INSERTION PORT-A-CATH WITH [place];  Surgeon: [name], MD;  Location: [facility];  Service: General;  Laterality: Right;  . portacath removal     oct. 5 2018  . ROBOTIC ASSISTED TOTAL HYSTERECTOMY Bilateral 06/16/2017   Procedure: XI ROBOTIC ASSISTED TOTAL HYSTERECTOMY WITH BILATERAL SALPINGECTOMY,  OOPHERECTOMY;  Surgeon: [name], MD;  Location: [facility];  Service: Gynecology;  Laterality: Bilateral;    FAMILY HISTORY Family History  Problem Relation Age of Onset  . Breast cancer Mother 45  . Hepatitis C Father   . Ovarian cancer Maternal Aunt        dx in her 13s  . Neurofibromatosis Maternal Uncle   . Brain cancer Maternal Uncle 58  . Lung cancer Maternal Grandfather   . Kidney failure Paternal Grandmother   . Heart attack Paternal Grandfather   . Ovarian cancer Maternal Aunt        dx in her 25s  . Neurofibromatosis Maternal Aunt   . Ovarian cancer Maternal Aunt   . Neurofibromatosis Maternal Aunt   . Cervical cancer Maternal Aunt   . Neurofibromatosis Maternal Aunt   . Cancer Maternal Aunt        cancer on the bottom of her foot  . Breast cancer Other        MGF's sisters  . Neurofibromatosis Other        MGM's paternal aunt   The patient's mother was diagnosed with breast cancer at age 68, but tells me the lump in her breast had been present for at least 10 years prior to that. She is now 60 and doing well. The patient's father died at the age of 63 from sepsis following liver transplantation. The patient has 2 brothers, no sisters. The  patient tells me that she has at least 5 and sent cousins with breast cancer and there are other relatives with uterine cancer.   GYNECOLOGIC HISTORY:  Patient's last menstrual period was 01/10/2017. Menarche age 43, first live birth age 43, the patient is GX P1. She has had multiple progesterone and oral contraceptive treatments through [facility] because of her menometrorrhagia. 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 She will graduate from [employer] in 11/2018 with her associates in business. Her husband [name] is a truck Geophysicist/field seismologist. The patient's daughter [name] is studying pre-veterinary medicine in college. [name] has 3 children, all boys,Akim is a cook in [place] and lives independently. The 2 younger boys, [name] and [name], aged 16 and 24 as of April 2018, are at home with the patient.     ADVANCED DIRECTIVES:  Not in place   HEALTH MAINTENANCE: Social History   Tobacco Use  . Smoking status: Former Smoker    Packs/day: 0.15    Years: 25.00    Pack years: 3.75    Types: Cigarettes    Quit date: 11/25/2016    Years since quitting: 2.4  . Smokeless tobacco: Never Used  Substance Use Topics  . Alcohol use: Yes    Alcohol/week: 1.0 standard drinks    Types: 1 Glasses of wine per week    Comment: rarely   . Drug use: No    Colonoscopy: n/a  PAP:  Bone density:   Allergies  Allergen Reactions  . Lisinopril     Cough, itchy throat  . Penicillins Other (See Comments)    As a child Has patient had a PCN reaction causing immediate rash, facial/tongue/throat swelling, SOB or lightheadedness with hypotension: unknown Has patient had a PCN reaction causing severe rash involving mucus membranes or skin necrosis: unknown Has patient had a PCN reaction that required hospitalization unknown Has patient had a PCN reaction occurring within the last 10 years: no If all of the above answers are "NO",  then may proceed with Cephalosporin use.     Current Outpatient Medications  Medication Sig Dispense Refill  . albuterol (PROAIR HFA) 108 (90 Base) MCG/ACT inhaler Inhale 2 puffs into the lungs every 6 (six) hours as needed for wheezing or shortness of breath. 1 Inhaler 0  . atorvastatin (LIPITOR) 20 MG tablet Take 1 tablet (20 mg total) by mouth daily. 90 tablet 0  . Dulaglutide (TRULICITY) 1.5 XY/8.0XK SOPN Inject 1.5 mg into the skin once a week. 4 pen 0  . gabapentin (NEURONTIN) 300 MG capsule Take 1 capsule (300 mg total) by mouth at bedtime. 90 capsule 4  . Glucose Blood (BLOOD GLUCOSE TEST STRIPS) STRP Test twice a day. Pt uses one touch verio flex meter 100 each 5  . ibuprofen (ADVIL,MOTRIN) 200 MG tablet Take 400 mg by mouth daily as needed for headache or moderate pain.     . Melatonin 5 MG TABS Take 10 mg by mouth at bedtime as needed (sleep).    . metFORMIN (GLUCOPHAGE) 1000 MG tablet Take 1 tablet (1,000 mg total) by mouth 2 (two) times daily. 120 tablet 0  . ONETOUCH DELICA LANCETS FINE MISC Test twice a day 100 each 5  . venlafaxine XR (EFFEXOR-XR) 150 MG 24 hr capsule TAKE 1 CAPSULE BY MOUTH DAILY WITH BREAKFAST  180 capsule 0   No current facility-administered medications for this visit.     OBJECTIVE: Young-appearing African-American woman appears well  Vitals:   05/12/19 1418  BP: (!) 155/96  Pulse: 90  Resp: 18  Temp: 98.5 F (36.9 C)  SpO2: 99%     Body mass index is 47.47 kg/m.    ECOG FS:1 - Symptomatic but completely ambulatory  GENERAL: Patient is a well appearing female in no acute distress HEENT:  Sclerae anicteric.  Oropharynx clear and moist. No ulcerations or evidence of oropharyngeal candidiasis. Neck is supple.  NODES:  No cervical, supraclavicular, or axillary lymphadenopathy palpated.  BREAST EXAM: right breast s/p lumpectomy and radiation, no sign of local recurrence, left breast benign LUNGS:  Clear to auscultation bilaterally.  No wheezes or  rhonchi. HEART:  Regular rate and rhythm. No murmur appreciated. ABDOMEN:  Soft, nontender.  Positive, normoactive bowel sounds. No organomegaly palpated. MSK:  No focal spinal tenderness to palpation. Full range of motion bilaterally in the upper extremities.  EXTREMITIES:  No peripheral edema.   SKIN:  Clear with no obvious rashes or skin changes. No nail dyscrasia. NEURO:  Nonfocal. Well oriented.  Appropriate affect.    LAB RESULTS:  CMP     Component Value Date/Time   NA 137 05/12/2019 1405   NA 143 09/28/2017 1539   NA 140 05/07/2017 1221   K 4.3 05/12/2019 1405   K 4.0 05/07/2017 1221   CL 103 05/12/2019 1405   CO2 26 05/12/2019 1405   CO2 24 05/07/2017 1221   GLUCOSE 214 (H) 05/12/2019 1405   GLUCOSE 173 (H) 05/07/2017 1221   BUN 10 05/12/2019 1405   BUN 13 09/28/2017 1539   BUN 6.7 (L) 05/07/2017 1221   CREATININE 0.76 05/12/2019 1405   CREATININE 0.8 05/07/2017 1221   CALCIUM 9.4 05/12/2019 1405   CALCIUM 9.7 05/07/2017 1221   PROT 6.7 05/12/2019 1405   PROT 6.8 09/28/2017 1539   PROT 6.8 05/07/2017 1221   ALBUMIN 3.4 (L) 05/12/2019 1405   ALBUMIN 4.3 09/28/2017 1539   ALBUMIN 3.4 (L) 05/07/2017 1221   AST 28 05/12/2019 1405   AST 21 05/07/2017 1221   ALT 50 (H) 05/12/2019 1405   ALT 24 05/07/2017 1221   ALKPHOS 125 05/12/2019 1405   ALKPHOS 131 05/07/2017 1221   BILITOT 0.3 05/12/2019 1405   BILITOT 0.2 09/28/2017 1539   BILITOT 0.48 05/07/2017 1221   GFRNONAA >60 05/12/2019 1405   GFRAA >60 05/12/2019 1405    No results found for: Ronnald Ramp, A1GS, A2GS, BETS, BETA2SER, GAMS, MSPIKE, SPEI  No results found for: KPAFRELGTCHN, LAMBDASER, [facility]  Lab Results  Component Value Date   WBC 5.8 05/12/2019   NEUTROABS 4.0 05/12/2019   HGB 12.5 05/12/2019   HCT 38.8 05/12/2019   MCV 81.3 05/12/2019   PLT 284 05/12/2019      Chemistry      Component Value Date/Time   NA 137 05/12/2019 1405   NA 143 09/28/2017 1539   NA 140  05/07/2017 1221   K 4.3 05/12/2019 1405   K 4.0 05/07/2017 1221   CL 103 05/12/2019 1405   CO2 26 05/12/2019 1405   CO2 24 05/07/2017 1221   BUN 10 05/12/2019 1405   BUN 13 09/28/2017 1539   BUN 6.7 (L) 05/07/2017 1221   CREATININE 0.76 05/12/2019 1405   CREATININE 0.8 05/07/2017 1221      Component Value Date/Time   CALCIUM 9.4 05/12/2019 1405   CALCIUM 9.7 05/07/2017 1221   ALKPHOS 125 05/12/2019 1405   ALKPHOS 131 05/07/2017 1221   AST 28 05/12/2019 1405   AST 21 05/07/2017 1221   ALT 50 (H) 05/12/2019 1405   ALT 24 05/07/2017 1221   BILITOT 0.3 05/12/2019 1405   BILITOT 0.2 09/28/2017 1539   BILITOT 0.48 05/07/2017 1221       No results found for: LABCA2  No components found for: JOINOM767  No results for input(s): INR in the last 168 hours.  Urinalysis    Component Value Date/Time   COLORURINE YELLOW 10/29/2017 1532   APPEARANCEUR CLEAR 10/29/2017 1532   LABSPEC 1.015 01/12/2018 1100   PHURINE 5.0 10/29/2017 1532   GLUCOSEU 50 (A) 10/29/2017 1532   HGBUR MODERATE (A) 10/29/2017 1532   BILIRUBINUR negative 01/12/2018 1100   BILIRUBINUR n 09/29/2016 1045   KETONESUR negative 01/12/2018 1100   KETONESUR NEGATIVE 10/29/2017 1532   PROTEINUR negative 01/12/2018 1100   PROTEINUR 30 (A) 10/29/2017 1532   UROBILINOGEN negative 09/29/2016 1045  NITRITE Negative 01/12/2018 1100   NITRITE NEGATIVE 10/29/2017 1532   LEUKOCYTESUR Negative 01/12/2018 1100     STUDIES: No results found.   ELIGIBLE FOR AVAILABLE RESEARCH PROTOCOL: not a candidate for PREVENT   ASSESSMENT: 43 y.o.  [place] woman status post right breast upper inner quadrant biopsy 10/16/2016 for a clinical T1b pN0, stage IB invasive ductal carcinoma, grade 3, triple negative, with an MIB-1 of 80%.  (1)  genetics testing 12/01/2016 showed several variants of uncertain significance but no deleterious mutation  (2) neoadjuvant chemotherapy consisting of doxorubicin and cyclophosphamide in dose  dense fashion 4 starting 11/05/2016, received third cycle 12/03/2016 then proceeded to weekly Abraxane starting 01/01/2017  (a) cycle 4 of cyclophosphamide and doxorubicin given at end of Abraxane on 02/12/2017  (b) Abraxane given from 01/01/17-01/22/17 (4 cycles), stopped early due to peripheral neuropathy  (3) status post right lumpectomy and sentinel lymph node sampling 03/18/2017 for a ypT1b ypN0 invasive ductal carcinoma, grade 3, with negative margins, and repeat prognostic panel again triple negative  (4) adjuvant radiation completed 05/20/2017 Site/dose:  The right breast was treated to 40.05 Gy in 15 fractions, followed by a 10 Gy boost in 5 fractions to yield a total dose of 50.05 Gy  (5) extensive right upper extremity deep venous thrombosis documented 11/23/2016, treated initially with Lovenox  (a) transitioned to rivaroxaban as of 11/30/2016  (b) total 6 months anticoagulation planned (one month beyond port removal)  (c) d-dimer 08/11/2017 normal  (d) rivaroxaban discontinued late January 2019 for financial reasons  (6) tobacco abuse: The patient quit smoking 11/26/2016  (7) menometrorrhagia, with a cystic right adnexal lesion and a possible cervical polyp noted on ultrasound 12/16/2016, with benign endocervical curettage and endometrial biopsy 12/16/2016  (a) CA 125 12/17/2016 was 14.2 (normal).  (b) adnexal mass, likely benign, will be explored at the completion of chemotherapy  (c) goserelin started 12/16/2016, continued every 28 days, last dose 06/05/2017  (d) status post hysterectomy and bilateral salpingo-oophorectomy 06/16/2017 with benign pathology  (8) iron deficiency anemia secondary to menometrorrhagia, status post Feraheme 05/20/2017 and 06/01/2017  (9) likely thalassemia trait  (10) post traumatic stress disorder  (a) venlafaxine increased to 150 mg daily as of 02/25/2018    PLAN: Keren is doing moderately well.  I let her know that I am concerned about her  significant increase in bone pain.  Given the fact that she has h/o triple negative breast cancer, I will order bone scan to rule out metastatic involvement in her bones.  I offered to prescribe her Tramadol for pain temporarily while we work up the etiology.  She declines at this point.  I cautioned her on using increasing amounts of ibuprofen as it can effect her kidneys.  She is going to see her PCP tomorrow for evaluation of other possible etiologies of her pain/sleep disturbance.    Madalen was recommended healthy diet and exercise.  I placed orders for a bone density and mammogram for her In 09/2019.  Ailish will return in 07/2019 for labs and f/u with Dr. Jana Hakim.  We will call her with her bone scan results.  She was recommended to continue with the appropriate pandemic precautions. She knows to call for any questions that may arise between now and her next appointment.  We are happy to see her sooner if needed.  A total of (30) minutes of face-to-face time was spent with this patient with greater than 50% of that time in counseling and care-coordination.   Wilber Bihari,  NP  05/12/19 3:08 PM Medical Oncology and Hematology [facility] [address] [place], [place] [address] Tel. [phone]    Fax. [phone]

## 2019-05-12 NOTE — Telephone Encounter (Signed)
Kelly Mendez with [facility] ph# [phone] called re: status of PA for [employer]. Please call the above contact to advise.

## 2019-05-13 ENCOUNTER — Encounter: Payer: Medicaid Other | Admitting: Family Medicine

## 2019-05-13 NOTE — Telephone Encounter (Signed)
Please follow-up on status of this PA.

## 2019-05-16 NOTE — Telephone Encounter (Signed)
This is from Friday Dr. Kelton Pillar does want to try PA's first

## 2019-05-16 NOTE — Telephone Encounter (Signed)
This pt also has no pharmacy benefits.

## 2019-05-20 ENCOUNTER — Encounter (HOSPITAL_COMMUNITY)
Admission: RE | Admit: 2019-05-20 | Discharge: 2019-05-20 | Disposition: A | Discharge status: Home / Self Care | Payer: Self-pay | Source: Ambulatory Visit | Attending: Adult Health | Admitting: Adult Health

## 2019-05-20 ENCOUNTER — Encounter (HOSPITAL_COMMUNITY): Payer: Self-pay

## 2019-05-20 ENCOUNTER — Other Ambulatory Visit: Payer: Self-pay

## 2019-05-20 DIAGNOSIS — Z171 Estrogen receptor negative status [ER-]: Secondary | ICD-10-CM | POA: Insufficient documentation

## 2019-05-20 DIAGNOSIS — C50211 Malignant neoplasm of upper-inner quadrant of right female breast: Secondary | ICD-10-CM | POA: Insufficient documentation

## 2019-05-20 MED ORDER — TECHNETIUM TC 99M MEDRONATE IV KIT
21.6000 | PACK | Freq: Once | INTRAVENOUS | Status: AC | PRN
  Administered 2019-05-20: 21.6 via INTRAVENOUS

## 2019-05-24 ENCOUNTER — Encounter: Payer: Self-pay | Admitting: Adult Health

## 2019-05-25 ENCOUNTER — Other Ambulatory Visit: Payer: Self-pay | Admitting: Oncology

## 2019-05-25 DIAGNOSIS — Z171 Estrogen receptor negative status [ER-]: Secondary | ICD-10-CM

## 2019-05-25 DIAGNOSIS — C50211 Malignant neoplasm of upper-inner quadrant of right female breast: Secondary | ICD-10-CM

## 2019-05-25 NOTE — Progress Notes (Signed)
I called Kelly Mendez and discussed the findings of the bone scan which show significant arthritis.  This explains her pain.  She is doing some walking which is helpful.  I suggested she try tai chi as well.  She is taking some nonsteroidals which also helped.  We discussed doing a noncontrast head CT just to clarify the dose spots, which are very vague and almost certainly benign in the skull, are really nothing.  However she has no insurance today.  She would like to postpone that until January.  Accordingly I will put that in for the second week in January.  She sees me the following week.

## 2019-05-30 ENCOUNTER — Other Ambulatory Visit: Payer: Self-pay | Admitting: Oncology

## 2019-05-31 ENCOUNTER — Telehealth: Payer: Self-pay | Admitting: Adult Health

## 2019-05-31 NOTE — Telephone Encounter (Signed)
Reviewed with patient that she has degenerative arthritis noted on bone scan and that the skull lesions are likely artifact.  She will have CT head non contrast to evaluate, however would like to wait until January since she will have coverage then.  This is reasonable, and we can certainly postpone until then.    Wilber Bihari, NP

## 2019-06-06 ENCOUNTER — Ambulatory Visit: Payer: Medicaid Other | Admitting: Internal Medicine

## 2019-06-08 ENCOUNTER — Other Ambulatory Visit: Payer: Self-pay

## 2019-06-08 ENCOUNTER — Telehealth: Payer: Self-pay | Admitting: Emergency Medicine

## 2019-06-08 ENCOUNTER — Ambulatory Visit
Admission: EM | Admit: 2019-06-08 | Discharge: 2019-06-08 | Disposition: A | Discharge status: Home / Self Care | Payer: Medicaid Other | Source: Home / Self Care

## 2019-06-08 ENCOUNTER — Ambulatory Visit
Admission: EM | Admit: 2019-06-08 | Discharge: 2019-06-08 | Disposition: A | Discharge status: Home / Self Care | Payer: Medicaid Other | Attending: Emergency Medicine | Admitting: Emergency Medicine

## 2019-06-08 ENCOUNTER — Telehealth: Payer: Medicaid Other

## 2019-06-08 ENCOUNTER — Ambulatory Visit
Admission: RE | Admit: 2019-06-08 | Discharge: 2019-06-08 | Disposition: A | Discharge status: Home / Self Care | Payer: Self-pay | Source: Ambulatory Visit

## 2019-06-08 ENCOUNTER — Encounter: Payer: Self-pay | Admitting: Emergency Medicine

## 2019-06-08 DIAGNOSIS — L080 Pyoderma: Secondary | ICD-10-CM

## 2019-06-08 DIAGNOSIS — L03114 Cellulitis of left upper limb: Secondary | ICD-10-CM

## 2019-06-08 MED ORDER — DOXYCYCLINE HYCLATE 100 MG PO CAPS: 100.0000 mg | ORAL_CAPSULE | Freq: Two times a day (BID) | ORAL | 0 refills | Status: DC

## 2019-06-08 MED ORDER — TRIAMCINOLONE ACETONIDE 0.5 % EX OINT: 1.0000 "application " | TOPICAL_OINTMENT | Freq: Two times a day (BID) | CUTANEOUS | 0 refills | Status: DC

## 2019-06-08 MED ORDER — DOXYCYCLINE HYCLATE 100 MG PO CAPS: 100.0000 mg | ORAL_CAPSULE | Freq: Two times a day (BID) | ORAL | 0 refills | Status: AC

## 2019-06-08 NOTE — ED Triage Notes (Signed)
Pt presents to [facility] for assessment of rash to left wrist with pustulent spots.  Patient noted redness spreading up from the area starting this morning.  Area is painful.

## 2019-06-08 NOTE — ED Provider Notes (Signed)
[facility]    CSN: [id number] Arrival date & time: 06/08/19  1508      History   Chief Complaint Chief Complaint  Patient presents with  . Rash    HPI Njeri Onorato is a 43 y.o. female presenting for pustular, pruritic rash over left wrist.  Patient denies bug bite, trauma to the area,, change in exposures, history of similar lesion.  Patient has had vesicular dermatitis in the past, though has not had pustules like this.  Patient also noticing some streaking up her arm.  Patient denies chest pain, shortness of breath, fever, open wound, discharge.   Past Medical History:  Diagnosis Date  . Abnormal Pap smear of cervix   . Anxiety   . Asthma   . Asymptomatic microscopic hematuria 09/29/2017  . 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 Concussion    around age 75  . Diabetes type 2, uncontrolled ([employer])    new diagnosis in 08/2016  . DVT (deep venous thrombosis) ([place]) 11/24/2016   in rt arm  . Family history of breast cancer   . Family history of neurofibromatosis   . Family history of ovarian cancer   . History of radiation therapy 04/21/17- 05/20/17   Right Breast 40.05 Gy in 15 fractions followed by a 10 Gy boost in 5 fractions to yield a total dose of 50.05 Gy  . HPV in female   . Hyperlipemia   . Malignant neoplasm of upper-inner quadrant of right breast in female, estrogen receptor negative ([place]) 10/21/2016  . Neuromuscular disorder (HCC)    neuropathy  . Pelvic mass in female 06/16/2017  . Personal history of chemotherapy 2018  . Personal history of radiation therapy 2018  . Pneumonia    2014   . PTSD (post-traumatic stress disorder)     Patient Active Problem List   Diagnosis Date Noted  . Hot flashes 08/13/2018  . Snoring 04/15/2018  . Post traumatic stress disorder (PTSD) 02/25/2018  . Anxiety   . Asymptomatic microscopic hematuria 09/29/2017  . Iron deficiency anemia 08/06/2017  . Adnexal mass,  RIGHT 12/16/2016  . Menorrhagia 12/16/2016  . Genetic testing 12/15/2016  . Family history of breast cancer   . Family history of ovarian cancer   . Family history of neurofibromatosis   . Acute deep vein thrombosis (DVT) of axillary vein of right upper extremity ([place]) 11/26/2016  . Type 2 diabetes mellitus with diabetic polyneuropathy, without long-term current use of insulin ([place])   . Malignant neoplasm of upper-inner quadrant of right breast in female, estrogen receptor negative ([place]) 10/21/2016  . Low HDL (under 40) 09/16/2016  . ASCVD (arteriosclerotic cardiovascular disease) 09/16/2016  . Light cigarette smoker 09/15/2016  . Morbid obesity ([place]) 09/15/2016  . Mild intermittent asthma 09/15/2016    Past Surgical History:  Procedure Laterality Date  . BREAST LUMPECTOMY Right 03/18/2017  . BREAST LUMPECTOMY WITH RADIOACTIVE SEED AND SENTINEL LYMPH NODE BIOPSY Right 03/18/2017   Procedure: RIGHT BREAST LUMPECTOMY WITH RADIOACTIVE SEED AND RIGHT SENTINEL LYMPH NODE BIOPSY;  Surgeon: [name], MD;  Location: [place];  Service: General;  Laterality: Right;  . COLPOSCOPY    . left ovary removed  1978   was told her ovary was removed but Dr. Denman George found that was not the case. Suspect this was originally an ovarian cystectomy  . PORTACATH PLACEMENT Right 10/29/2016   Procedure: INSERTION PORT-A-CATH WITH [place];  Surgeon:  [name], MD;  Location: [facility];  Service: General;  Laterality: Right;  . portacath removal     oct. 5 2018  . ROBOTIC ASSISTED TOTAL HYSTERECTOMY Bilateral 06/16/2017   Procedure: XI ROBOTIC ASSISTED TOTAL HYSTERECTOMY WITH BILATERAL SALPINGECTOMY,  OOPHERECTOMY;  Surgeon: [name], MD;  Location: [facility];  Service: Gynecology;  Laterality: Bilateral;    OB History   No obstetric history on file.      Home Medications    Prior to Admission medications   Medication Sig Start Date End Date Taking? Authorizing Provider  albuterol (PROAIR  HFA) 108 (90 Base) MCG/ACT inhaler Inhale 2 puffs into the lungs every 6 (six) hours as needed for wheezing or shortness of breath. 10/11/18   Henson, Vickie L, NP-C  atorvastatin (LIPITOR) 20 MG tablet Take 1 tablet (20 mg total) by mouth daily. 02/22/19   Henson, Vickie L, NP-C  doxycycline (VIBRAMYCIN) 100 MG capsule Take 1 capsule (100 mg total) by mouth 2 (two) times daily for 7 days. 06/08/19 06/15/19  Hall-Potvin, [place], PA-C  Dulaglutide (TRULICITY) 1.5 0000000 SOPN Inject 1.5 mg into the skin once a week. 05/11/19   Shamleffer, Melanie Crazier, MD  gabapentin (NEURONTIN) 300 MG capsule Take 1 capsule (300 mg total) by mouth at bedtime. 08/11/18   Magrinat, Virgie Dad, MD  Glucose Blood (BLOOD GLUCOSE TEST STRIPS) STRP Test twice a day. Pt uses one touch verio flex meter 09/15/16   Henson, Vickie L, NP-C  ibuprofen (ADVIL,MOTRIN) 200 MG tablet Take 400 mg by mouth daily as needed for headache or moderate pain.     [provider]  Melatonin 5 MG TABS Take 10 mg by mouth at bedtime as needed (sleep).    [provider]  metFORMIN (GLUCOPHAGE) 1000 MG tablet Take 1 tablet (1,000 mg total) by mouth 2 (two) times daily. 04/04/19   Shamleffer, Melanie Crazier, MD  [facility] DELICA LANCETS FINE MISC Test twice a day 09/15/16   Harland Dingwall L, NP-C  triamcinolone ointment (KENALOG) 0.5 % Apply 1 application topically 2 (two) times daily. 06/08/19   Hall-Potvin, [place], PA-C  venlafaxine XR (EFFEXOR-XR) 150 MG 24 hr capsule TAKE 1 CAPSULE BY MOUTH DAILY WITH BREAKFAST  04/04/19   Magrinat, Virgie Dad, MD    Family History Family History  Problem Relation Age of Onset  . Breast cancer Mother 84  . Hepatitis C Father   . Ovarian cancer Maternal Aunt        dx in her 8s  . Neurofibromatosis Maternal Uncle   . Brain cancer Maternal Uncle 58  . Lung cancer Maternal Grandfather   . Kidney failure Paternal Grandmother   . Heart attack Paternal Grandfather   . Ovarian cancer Maternal  Aunt        dx in her 26s  . Neurofibromatosis Maternal Aunt   . Ovarian cancer Maternal Aunt   . Neurofibromatosis Maternal Aunt   . Cervical cancer Maternal Aunt   . Neurofibromatosis Maternal Aunt   . Cancer Maternal Aunt        cancer on the bottom of her foot  . Breast cancer Other        MGF's sisters  . Neurofibromatosis Other        MGM's paternal aunt    Social History Social History   Tobacco Use  . Smoking status: Former Smoker    Packs/day: 0.15    Years: 25.00    Pack years: 3.75    Types: Cigarettes  Quit date: 11/25/2016    Years since quitting: 2.5  . Smokeless tobacco: Never Used  Substance Use Topics  . Alcohol use: Yes    Alcohol/week: 1.0 standard drinks    Types: 1 Glasses of wine per week    Comment: rarely   . Drug use: No     Allergies   Lisinopril and Penicillins   Review of Systems Review of Systems  Constitutional: Negative for fatigue and fever.  HENT: Negative for ear pain, sinus pain, sore throat and voice change.   Eyes: Negative for pain, redness and visual disturbance.  Respiratory: Negative for cough and shortness of breath.   Cardiovascular: Negative for chest pain and palpitations.  Gastrointestinal: Negative for abdominal pain, diarrhea and vomiting.  Musculoskeletal: Negative for arthralgias and myalgias.  Skin: Positive for rash. Negative for wound.  Neurological: Negative for syncope and headaches.     Physical Exam Triage Vital Signs ED Triage Vitals  Enc Vitals Group     BP 06/08/19 1519 (!) 154/91     Pulse Rate 06/08/19 1519 89     Resp 06/08/19 1519 18     Temp 06/08/19 1519 97.6 F (36.4 C)     Temp Source 06/08/19 1519 Temporal     SpO2 06/08/19 1519 97 %     Weight --      Height --      Head Circumference --      Peak Flow --      Pain Score 06/08/19 1522 3     Pain Loc --      Pain Edu? --      Excl. in [place]? --    No data found.  Updated Vital Signs BP (!) 154/91 (BP Location: Left Wrist)    Pulse 89   Temp 97.6 F (36.4 C) (Temporal)   Resp 18   LMP 01/10/2017   SpO2 97%   Visual Acuity Right Eye Distance:   Left Eye Distance:   Bilateral Distance:    Right Eye Near:   Left Eye Near:    Bilateral Near:     Physical Exam Constitutional:      General: She is not in acute distress. HENT:     Head: Normocephalic and atraumatic.  Eyes:     General: No scleral icterus.    Pupils: Pupils are equal, round, and reactive to light.  Cardiovascular:     Rate and Rhythm: Normal rate.  Pulmonary:     Effort: Pulmonary effort is normal.  Skin:    Comments: Left forearm with 2.5 cm area of Brull pustules with surrounding erythema.  Patient does have tenderness over lesion, erythematous strengthening with warmth up to elbow.  No elbow joint pain, decreased range of motion.  No open wound, punctate wound, active discharge.  Neurological:     Mental Status: She is alert and oriented to person, place, and time.      UC Treatments / Results  Labs (all labs ordered are listed, but only abnormal results are displayed) Labs Reviewed - No data to display  EKG   Radiology No results found.  Procedures Procedures (including critical care time)  Medications Ordered in UC Medications - No data to display  Initial Impression / Assessment and Plan / UC Course  I have reviewed the triage vital signs and the nursing notes.  Pertinent labs & imaging results that were available during my care of the patient were reviewed by me and considered in my medical decision making (  see chart for details).      Dermatitis: We will treat with topical steroid, antibiotic for concern of current cellulitis.  Patient return in 2 days for wound check, or PCP for convenient.  Return precautions discussed, patient verbalized understanding and is agreeable to plan. Final Clinical Impressions(s) / UC Diagnoses   Final diagnoses:  Purulent dermatitis  Cellulitis of left forearm      Discharge Instructions     Take antibiotic and use topical steroid as prescribed. May continue Benadryl as needed for itching. Recommend being reevaluated in 2 days: PCP/this urgent care. Go to ER for sooner evaluation for spreading of redness, pain, fevers, joint pain, chest pain.    ED Prescriptions    Medication Sig Dispense Auth. Provider   triamcinolone ointment (KENALOG) 0.5 % Apply 1 application topically 2 (two) times daily. 30 g Hall-Potvin, [place], PA-C   doxycycline (VIBRAMYCIN) 100 MG capsule Take 1 capsule (100 mg total) by mouth 2 (two) times daily for 7 days. 14 capsule Hall-Potvin, [place], PA-C     PDMP not reviewed this encounter.   Neldon Mc [place], [place] 06/08/19 [id number]

## 2019-06-08 NOTE — Discharge Instructions (Signed)
Take antibiotic and use topical steroid as prescribed. May continue Benadryl as needed for itching. Recommend being reevaluated in 2 days: PCP/this urgent care. Go to ER for sooner evaluation for spreading of redness, pain, fevers, joint pain, chest pain.

## 2019-06-17 ENCOUNTER — Emergency Department (HOSPITAL_COMMUNITY)
Admission: EM | Admit: 2019-06-17 | Discharge: 2019-06-17 | Disposition: A | Discharge status: Home / Self Care | Payer: Medicaid Other | Attending: Emergency Medicine | Admitting: Emergency Medicine

## 2019-06-17 ENCOUNTER — Encounter (HOSPITAL_COMMUNITY): Payer: Self-pay

## 2019-06-17 ENCOUNTER — Other Ambulatory Visit: Payer: Self-pay

## 2019-06-17 DIAGNOSIS — M5417 Radiculopathy, lumbosacral region: Secondary | ICD-10-CM

## 2019-06-17 DIAGNOSIS — J45909 Unspecified asthma, uncomplicated: Secondary | ICD-10-CM | POA: Insufficient documentation

## 2019-06-17 DIAGNOSIS — Z853 Personal history of malignant neoplasm of breast: Secondary | ICD-10-CM | POA: Insufficient documentation

## 2019-06-17 DIAGNOSIS — Z923 Personal history of irradiation: Secondary | ICD-10-CM | POA: Insufficient documentation

## 2019-06-17 DIAGNOSIS — M79604 Pain in right leg: Secondary | ICD-10-CM | POA: Insufficient documentation

## 2019-06-17 DIAGNOSIS — Z79899 Other long term (current) drug therapy: Secondary | ICD-10-CM | POA: Insufficient documentation

## 2019-06-17 DIAGNOSIS — E119 Type 2 diabetes mellitus without complications: Secondary | ICD-10-CM | POA: Insufficient documentation

## 2019-06-17 DIAGNOSIS — Z9221 Personal history of antineoplastic chemotherapy: Secondary | ICD-10-CM | POA: Insufficient documentation

## 2019-06-17 DIAGNOSIS — Z7984 Long term (current) use of oral hypoglycemic drugs: Secondary | ICD-10-CM | POA: Insufficient documentation

## 2019-06-17 DIAGNOSIS — Z87891 Personal history of nicotine dependence: Secondary | ICD-10-CM | POA: Insufficient documentation

## 2019-06-17 MED ORDER — TIZANIDINE HCL 2 MG PO CAPS: 2.0000 mg | ORAL_CAPSULE | Freq: Three times a day (TID) | ORAL | 0 refills | Status: DC

## 2019-06-17 MED ORDER — DEXAMETHASONE SODIUM PHOSPHATE 10 MG/ML IJ SOLN
10.0000 mg | Freq: Once | INTRAMUSCULAR | Status: AC
  Administered 2019-06-17: 10 mg via INTRAMUSCULAR
  Filled 2019-06-17: qty 1

## 2019-06-17 MED ORDER — DICLOFENAC EPOLAMINE 1.3 % EX PTCH
1.0000 | MEDICATED_PATCH | Freq: Once | CUTANEOUS | Status: DC
  Administered 2019-06-17: 1 via TRANSDERMAL
  Filled 2019-06-17: qty 1

## 2019-06-17 MED ORDER — HYDROMORPHONE HCL 1 MG/ML IJ SOLN
1.0000 mg | Freq: Once | INTRAMUSCULAR | Status: AC
  Administered 2019-06-17: 1 mg via INTRAMUSCULAR
  Filled 2019-06-17: qty 1

## 2019-06-17 MED ORDER — PREDNISONE 20 MG PO TABS: 40.0000 mg | ORAL_TABLET | Freq: Every day | ORAL | 0 refills | Status: DC

## 2019-06-17 NOTE — ED Provider Notes (Signed)
[facility] DEPT Provider Note   CSN: [id number] Arrival date & time: 06/17/19  0902     History   Chief Complaint Chief Complaint  Patient presents with  . Hip Pain  . Back Pain    HPI Kelly Mendez is a 43 y.o. female.     HPI  Patient presents with new right lower back right leg pain. Onset was atraumatic about 6 days ago.  Symptoms have been progressively more severe, now with sharp severe sustained pain from the right lower back radiating down the posterior of the right leg.  Symptoms worse with activity, ambulation, weightbearing. Patient has no history of back surgery, no history of trauma.  She has had no relief with OTC medication.  She denies other new complaints including abdominal pain, chest pain, dyspnea, fever, chills, incontinence.   Past Medical History:  Diagnosis Date  . Abnormal Pap smear of cervix   . Anxiety   . Asthma   . Asymptomatic microscopic hematuria 09/29/2017  . 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 Concussion    around age 52  . Diabetes type 2, uncontrolled ([place])    new diagnosis in 08/2016  . DVT (deep venous thrombosis) ([place]) 11/24/2016   in rt arm  . Family history of breast cancer   . Family history of neurofibromatosis   . Family history of ovarian cancer   . History of radiation therapy 04/21/17- 05/20/17   Right Breast 40.05 Gy in 15 fractions followed by a 10 Gy boost in 5 fractions to yield a total dose of 50.05 Gy  . HPV in female   . Hyperlipemia   . Malignant neoplasm of upper-inner quadrant of right breast in female, estrogen receptor negative ([place]) 10/21/2016  . Neuromuscular disorder (HCC)    neuropathy  . Pelvic mass in female 06/16/2017  . Personal history of chemotherapy 2018  . Personal history of radiation therapy 2018  . Pneumonia    2014   . PTSD (post-traumatic stress disorder)     Patient Active Problem List   Diagnosis Date Noted   . Hot flashes 08/13/2018  . Snoring 04/15/2018  . Post traumatic stress disorder (PTSD) 02/25/2018  . Anxiety   . Asymptomatic microscopic hematuria 09/29/2017  . Iron deficiency anemia 08/06/2017  . Adnexal mass, RIGHT 12/16/2016  . Menorrhagia 12/16/2016  . Genetic testing 12/15/2016  . Family history of breast cancer   . Family history of ovarian cancer   . Family history of neurofibromatosis   . Acute deep vein thrombosis (DVT) of axillary vein of right upper extremity ([place]) 11/26/2016  . Type 2 diabetes mellitus with diabetic polyneuropathy, without long-term current use of insulin ([place])   . Malignant neoplasm of upper-inner quadrant of right breast in female, estrogen receptor negative ([place]) 10/21/2016  . Low HDL (under 40) 09/16/2016  . ASCVD (arteriosclerotic cardiovascular disease) 09/16/2016  . Light cigarette smoker 09/15/2016  . Morbid obesity ([place]) 09/15/2016  . Mild intermittent asthma 09/15/2016    Past Surgical History:  Procedure Laterality Date  . BREAST LUMPECTOMY Right 03/18/2017  . BREAST LUMPECTOMY WITH RADIOACTIVE SEED AND SENTINEL LYMPH NODE BIOPSY Right 03/18/2017   Procedure: RIGHT BREAST LUMPECTOMY WITH RADIOACTIVE SEED AND RIGHT SENTINEL LYMPH NODE BIOPSY;  Surgeon: [name], MD;  Location: [facility];  Service: General;  Laterality: Right;  . COLPOSCOPY    . left ovary removed  1978  was told her ovary was removed but Dr. Denman George found that was not the case. Suspect this was originally an ovarian cystectomy  . PORTACATH PLACEMENT Right 10/29/2016   Procedure: INSERTION PORT-A-CATH WITH [place];  Surgeon: [name], MD;  Location: [facility];  Service: General;  Laterality: Right;  . portacath removal     oct. 5 2018  . ROBOTIC ASSISTED TOTAL HYSTERECTOMY Bilateral 06/16/2017   Procedure: XI ROBOTIC ASSISTED TOTAL HYSTERECTOMY WITH BILATERAL SALPINGECTOMY,  OOPHERECTOMY;  Surgeon: [name], MD;  Location[facility];  Service: Gynecology;   Laterality: Bilateral;     OB History   No obstetric history on file.      Home Medications    Prior to Admission medications   Medication Sig Start Date End Date Taking? Authorizing Provider  albuterol (PROAIR HFA) 108 (90 Base) MCG/ACT inhaler Inhale 2 puffs into the lungs every 6 (six) hours as needed for wheezing or shortness of breath. 10/11/18   Henson, Vickie L, NP-C  atorvastatin (LIPITOR) 20 MG tablet Take 1 tablet (20 mg total) by mouth daily. 02/22/19   Henson, Vickie L, NP-C  Dulaglutide (TRULICITY) 1.5 0000000 SOPN Inject 1.5 mg into the skin once a week. 05/11/19   Shamleffer, Melanie Crazier, MD  gabapentin (NEURONTIN) 300 MG capsule Take 1 capsule (300 mg total) by mouth at bedtime. 08/11/18   Magrinat, Virgie Dad, MD  Glucose Blood (BLOOD GLUCOSE TEST STRIPS) STRP Test twice a day. Pt uses one touch verio flex meter 09/15/16   Henson, Vickie L, NP-C  ibuprofen (ADVIL,MOTRIN) 200 MG tablet Take 400 mg by mouth daily as needed for headache or moderate pain.     [provider]  Melatonin 5 MG TABS Take 10 mg by mouth at bedtime as needed (sleep).    [provider]  metFORMIN (GLUCOPHAGE) 1000 MG tablet Take 1 tablet (1,000 mg total) by mouth 2 (two) times daily. 04/04/19   Shamleffer, Melanie Crazier, MD  [facility] DELICA LANCETS FINE MISC Test twice a day 09/15/16   Harland Dingwall L, NP-C  triamcinolone ointment (KENALOG) 0.5 % Apply 1 application topically 2 (two) times daily. 06/08/19   Hall-Potvin, [place], PA-C  venlafaxine XR (EFFEXOR-XR) 150 MG 24 hr capsule TAKE 1 CAPSULE BY MOUTH DAILY WITH BREAKFAST  04/04/19   Magrinat, Virgie Dad, MD    Family History Family History  Problem Relation Age of Onset  . Breast cancer Mother 30  . Hepatitis C Father   . Ovarian cancer Maternal Aunt        dx in her 35s  . Neurofibromatosis Maternal Uncle   . Brain cancer Maternal Uncle 58  . Lung cancer Maternal Grandfather   . Kidney failure Paternal Grandmother   .  Heart attack Paternal Grandfather   . Ovarian cancer Maternal Aunt        dx in her 25s  . Neurofibromatosis Maternal Aunt   . Ovarian cancer Maternal Aunt   . Neurofibromatosis Maternal Aunt   . Cervical cancer Maternal Aunt   . Neurofibromatosis Maternal Aunt   . Cancer Maternal Aunt        cancer on the bottom of her foot  . Breast cancer Other        MGF's sisters  . Neurofibromatosis Other        MGM's paternal aunt    Social History Social History   Tobacco Use  . Smoking status: Former Smoker    Packs/day: 0.15    Years: 25.00  Pack years: 3.75    Types: Cigarettes    Quit date: 11/25/2016    Years since quitting: 2.5  . Smokeless tobacco: Never Used  Substance Use Topics  . Alcohol use: Yes    Alcohol/week: 1.0 standard drinks    Types: 1 Glasses of wine per week    Comment: rarely   . Drug use: No     Allergies   Lisinopril and Penicillins   Review of Systems Review of Systems  Constitutional:       Per HPI, otherwise negative  HENT:       Per HPI, otherwise negative  Respiratory:       Per HPI, otherwise negative  Cardiovascular:       Per HPI, otherwise negative  Gastrointestinal: Negative for vomiting.  Endocrine:       Negative aside from HPI  Genitourinary:       Neg aside from HPI   Musculoskeletal:       Per HPI, otherwise negative  Skin: Negative.   Neurological: Negative for syncope, weakness and numbness.     Physical Exam Updated Vital Signs BP (!) 144/107 (BP Location: Left Arm)   Pulse (!) 102   Temp 98.6 F (37 C) (Oral)   Resp 18   Ht 6\' 2"  (1.88 m)   Wt (!) 158.8 kg   LMP 01/10/2017   SpO2 99%   BMI 44.94 kg/m   Physical Exam Vitals signs and nursing note reviewed.  Constitutional:      General: She is not in acute distress.    Appearance: She is well-developed.  HENT:     Head: Normocephalic and atraumatic.  Eyes:     Conjunctiva/sclera: Conjunctivae normal.  Cardiovascular:     Rate and Rhythm: Normal  rate and regular rhythm.  Pulmonary:     Effort: Pulmonary effort is normal. No respiratory distress.     Breath sounds: Normal breath sounds. No stridor.  Abdominal:     General: There is no distension.  Musculoskeletal:       Back:     Comments: Patient moves all extremities spontaneously, and on lower extremity exam, has no strength discrepancy, but does have referred pain to the right lower back with left hip flexion, and right knee extension.  Skin:    General: Skin is warm and dry.  Neurological:     Mental Status: She is alert and oriented to person, place, and time.     Cranial Nerves: No cranial nerve deficit.     Procedures Procedures (including critical care time)  Medications Ordered in ED Medications  diclofenac (FLECTOR) 1.3 % 1 patch (1 patch Transdermal Patch Applied 06/17/19 0955)  dexamethasone (DECADRON) injection 10 mg (10 mg Intramuscular Given 06/17/19 0948)  HYDROmorphone (DILAUDID) injection 1 mg (1 mg Intramuscular Given 06/17/19 0948)     Initial Impression / Assessment and Plan / ED Course  I have reviewed the triage vital signs and the nursing notes.  Pertinent labs & imaging results that were available during my care of the patient were reviewed by me and considered in my medical decision making (see chart for details).        11:10 AM On repeat exam patient is awake and alert, states that she feels better. She has been able to move without nearly as much discomfort as she was on arrival. With a lengthy conversation about today's evaluation, concern for lumbar radiculopathy given her description, physical exam findings, absence of other notable abnormalities. Patient will  start topical and oral medication for relief, will follow up with physical therapy, primary care.  Final Clinical Impressions(s) / ED Diagnoses   Final diagnoses:  Lumbosacral radiculopathy    ED Discharge Orders         Ordered    predniSONE (DELTASONE) 20 MG tablet   Daily with breakfast     06/17/19 1112    tizanidine (ZANAFLEX) 2 MG capsule  3 times daily     06/17/19 1112           Carmin Muskrat, MD 06/17/19 1113

## 2019-06-17 NOTE — ED Triage Notes (Signed)
Patient c/o mid back pain that radiates to her right hip and down her right leg x 6 days. Patient states symptoms worsened 2 days ago. Pain worse when standing.

## 2019-06-17 NOTE — Discharge Instructions (Addendum)
As discussed, you likely have sciatica, or lumbosacral radiculopathy.  Please take medication as directed, and schedule follow-up with your primary care physician and physical therapy center. Return here for concerning changes.  In addition to prescribe medications, please obtain topical medication patches including the ingredients methyl salicylate for additional relief. Tylenol, methyl salicylate, Zanaflex and prednisone should bring some relief to your symptoms.

## 2019-06-20 ENCOUNTER — Encounter: Payer: Self-pay | Admitting: Family Medicine

## 2019-06-20 DIAGNOSIS — M5430 Sciatica, unspecified side: Secondary | ICD-10-CM

## 2019-06-20 NOTE — Telephone Encounter (Signed)
I have put referral in to ortho but pt wants a refill on meds

## 2019-06-21 ENCOUNTER — Other Ambulatory Visit: Payer: Self-pay

## 2019-06-21 ENCOUNTER — Ambulatory Visit (INDEPENDENT_AMBULATORY_CARE_PROVIDER_SITE_OTHER): Payer: Self-pay | Admitting: Family Medicine

## 2019-06-21 ENCOUNTER — Encounter: Payer: Self-pay | Admitting: Family Medicine

## 2019-06-21 VITALS — HR 76 | Wt 350.0 lb

## 2019-06-21 DIAGNOSIS — M5441 Lumbago with sciatica, right side: Secondary | ICD-10-CM

## 2019-06-21 NOTE — Progress Notes (Signed)
   Subjective:  Documentation for virtual audio and video telecommunications through [facility] encounter:  The patient was located at home. 2 patient identifiers used.  The provider was located in the office. The patient did consent to this visit and is aware of possible charges through their insurance for this visit.  The other persons participating in this telemedicine service were none.    Patient ID: Kelly Mendez, female    DOB: [birth year], 43 y.o.   MRN: [id number]  HPI Chief Complaint  Patient presents with  . back pain    back pain, scitica down right leg. having spasms.    Complains of a one week history of right low back pain that radiates down her right leg when she goes from sitting to standing.  States the pain feels like a spasm. She reports a constant pain that she rates as a 8/10 and then goes to a "13/10" with standing.  States she has numbness down the back of her right leg when walking.  Denies injury or history of similar back pain.  She was seen in the ED for this pain on 06/17/2019. She was given a course of prednisone and tizanidine. States she took her last prednisone today.  States she has a patch on now that is due to come off today and she was advised to get an OTC [place] patch.   No new symptoms since ED visit. Denies any leg weakness. No fever, chills.   States she has an appt with Dr. Junius Roads, orthopedist, tomorrow morning. States she has 3 Zanaflex tablets left. She is taking them every 8 hours.   Reviewed allergies, medications, past medical, surgical, family, and social history.   Review of Systems Pertinent positives and negatives in the history of present illness.     Objective:   Physical Exam Pulse 76   Wt (!) 350 lb (158.8 kg)   LMP 01/10/2017   SpO2 100%   BMI 44.94 kg/m   Alert and oriented and in no acute distress. Respirations unlabored. Unable to examine further due to this being a virtual visit.       Assessment  & Plan:  Acute right-sided low back pain with right-sided sciatica  Reviewed ED note and plan of care. Denies any new or worsening symptoms since her ED evaluation. Discussed continuing with conservative care including topical and oral anti-inflammatories and Zanaflex. She will see Dr. Junius Roads in the morning. I will appreciate his evaluation and recommendations.   Time spent on call was 12 minutes and in review of previous records 15 minutes total.  This virtual service is not related to other E/M service within previous 7 days.

## 2019-06-22 ENCOUNTER — Ambulatory Visit (INDEPENDENT_AMBULATORY_CARE_PROVIDER_SITE_OTHER): Payer: Self-pay | Admitting: Family Medicine

## 2019-06-22 ENCOUNTER — Ambulatory Visit: Payer: Medicaid Other | Admitting: Family Medicine

## 2019-06-22 ENCOUNTER — Encounter: Payer: Self-pay | Admitting: Family Medicine

## 2019-06-22 DIAGNOSIS — M5441 Lumbago with sciatica, right side: Secondary | ICD-10-CM

## 2019-06-22 MED ORDER — BACLOFEN 10 MG PO TABS: 5.0000 mg | ORAL_TABLET | Freq: Three times a day (TID) | ORAL | 3 refills | Status: DC | PRN

## 2019-06-22 MED ORDER — MELOXICAM 15 MG PO TABS: 7.5000 mg | ORAL_TABLET | Freq: Every day | ORAL | 6 refills | Status: DC | PRN

## 2019-06-22 MED ORDER — TRAMADOL HCL 50 MG PO TABS: 50.0000 mg | ORAL_TABLET | Freq: Every evening | ORAL | 0 refills | Status: DC | PRN

## 2019-06-22 NOTE — Progress Notes (Signed)
Office Visit Note   Patient: Kelly Mendez           Date of Birth: 04-15-1976           MRN: [id number] Visit Date: 06/22/2019 Requested by: [name], NP-C [place],  [place] [id number] PCP: [name], NP-C  Subjective: Chief Complaint  Patient presents with  . Lower Back - Pain    Pain in right lower back, radiating down the posterior thigh to the knee. Could hardly get out of bed last week due to muscle spasms. NKI. Went to ED 12/08. Muscle relaxer every 8 hours helps.    HPI: She is here with low back and right leg pain.  Symptoms started about a week and a half ago.  She recalls slipping and falling about a month ago but she did not have pain in her back at that point, and it is about 3 weeks before she started having pain so she does not think it is because of her pain.  She woke up 1 day with pain, and it has been hurting ever since then getting steadily worse.  It radiates down the back of the leg and to the posterior thigh and knee.  It hurts to transition, it hurts at rest.  She has had a little bit of bladder leakage but she thinks is from not being able to get to the bathroom quickly enough.  No fevers or chills, no urinary burning, no history of kidney stones.  She does have a history of breast cancer.  She had a bone scan recently which was essentially negative.              ROS:   All other systems were reviewed and are negative.  Objective: Vital Signs: LMP 01/10/2017   Physical Exam:  General:  Alert and oriented, in no acute distress. Pulm:  Breathing unlabored. Psy:  Normal mood, congruent affect. Skin: No rash on her skin. Low back: She is tender in the L5-S1 level and in the right sciatic notch.  No pain over the SI joints.  Straight leg raise positive on the right, cross positive on the left.  Lower extremity strength and reflexes are still normal.  Imaging: None today  Assessment & Plan: 1.  Low back and right  leg pain suspicious for lumbar disc herniation -She does not have insurance right now so she would like to avoid physical therapy for now.  Home exercises were given.  Prescriptions for tramadol, meloxicam and baclofen as needed. -If pain persist, then x-rays, possibly MRI scan.     Procedures: No procedures performed  No notes on file     PMFS History: Patient Active Problem List   Diagnosis Date Noted  . Hot flashes 08/13/2018  . Snoring 04/15/2018  . Post traumatic stress disorder (PTSD) 02/25/2018  . Anxiety   . Asymptomatic microscopic hematuria 09/29/2017  . Iron deficiency anemia 08/06/2017  . Adnexal mass, RIGHT 12/16/2016  . Menorrhagia 12/16/2016  . Genetic testing 12/15/2016  . Family history of breast cancer   . Family history of ovarian cancer   . Family history of neurofibromatosis   . Acute deep vein thrombosis (DVT) of axillary vein of right upper extremity ([place]) 11/26/2016  . Type 2 diabetes mellitus with diabetic polyneuropathy, without long-term current use of insulin ([place])   . Malignant neoplasm of upper-inner quadrant of right breast in female, estrogen receptor negative ([place]) 10/21/2016  . Low HDL (under  40) 09/16/2016  . ASCVD (arteriosclerotic cardiovascular disease) 09/16/2016  . Light cigarette smoker 09/15/2016  . Morbid obesity ([place]) 09/15/2016  . Mild intermittent asthma 09/15/2016   Past Medical History:  Diagnosis Date  . Abnormal Pap smear of cervix   . Anxiety   . Asthma   . Asymptomatic microscopic hematuria 09/29/2017  . 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 Concussion    around age 74  . Diabetes type 2, uncontrolled ([place])    new diagnosis in 08/2016  . DVT (deep venous thrombosis) ([place]) 11/24/2016   in rt arm  . Family history of breast cancer   . Family history of neurofibromatosis   . Family history of ovarian cancer   . History of radiation therapy 04/21/17- 05/20/17   Right Breast 40.05 Gy in 15  fractions followed by a 10 Gy boost in 5 fractions to yield a total dose of 50.05 Gy  . HPV in female   . Hyperlipemia   . Malignant neoplasm of upper-inner quadrant of right breast in female, estrogen receptor negative ([place]) 10/21/2016  . Neuromuscular disorder (HCC)    neuropathy  . Pelvic mass in female 06/16/2017  . Personal history of chemotherapy 2018  . Personal history of radiation therapy 2018  . Pneumonia    2014   . PTSD (post-traumatic stress disorder)     Family History  Problem Relation Age of Onset  . Breast cancer Mother 54  . Hepatitis C Father   . Ovarian cancer Maternal Aunt        dx in her 64s  . Neurofibromatosis Maternal Uncle   . Brain cancer Maternal Uncle 58  . Lung cancer Maternal Grandfather   . Kidney failure Paternal Grandmother   . Heart attack Paternal Grandfather   . Ovarian cancer Maternal Aunt        dx in her 80s  . Neurofibromatosis Maternal Aunt   . Ovarian cancer Maternal Aunt   . Neurofibromatosis Maternal Aunt   . Cervical cancer Maternal Aunt   . Neurofibromatosis Maternal Aunt   . Cancer Maternal Aunt        cancer on the bottom of her foot  . Breast cancer Other        MGF's sisters  . Neurofibromatosis Other        MGM's paternal aunt    Past Surgical History:  Procedure Laterality Date  . BREAST LUMPECTOMY Right 03/18/2017  . BREAST LUMPECTOMY WITH RADIOACTIVE SEED AND SENTINEL LYMPH NODE BIOPSY Right 03/18/2017   Procedure: RIGHT BREAST LUMPECTOMY WITH RADIOACTIVE SEED AND RIGHT SENTINEL LYMPH NODE BIOPSY;  Surgeon: [name], MD;  Location: [facility];  Service: General;  Laterality: Right;  . COLPOSCOPY    . left ovary removed  1978   was told her ovary was removed but Dr. Denman George found that was not the case. Suspect this was originally an ovarian cystectomy  . PORTACATH PLACEMENT Right 10/29/2016   Procedure: INSERTION PORT-A-CATH WITH [place];  Surgeon: [name], MD;  Location: [facility];  Service: General;   Laterality: Right;  . portacath removal     oct. 5 2018  . ROBOTIC ASSISTED TOTAL HYSTERECTOMY Bilateral 06/16/2017   Procedure: XI ROBOTIC ASSISTED TOTAL HYSTERECTOMY WITH BILATERAL SALPINGECTOMY,  OOPHERECTOMY;  Surgeon: [name], MD;  Location: [facility];  Service: Gynecology;  Laterality: Bilateral;   Social History   Occupational History  . Not on file  Tobacco  Use  . Smoking status: Former Smoker    Packs/day: 0.15    Years: 25.00    Pack years: 3.75    Types: Cigarettes    Quit date: 11/25/2016    Years since quitting: 2.5  . Smokeless tobacco: Never Used  Substance and Sexual Activity  . Alcohol use: Yes    Alcohol/week: 1.0 standard drinks    Types: 1 Glasses of wine per week    Comment: rarely   . Drug use: No  . Sexual activity: Not Currently    Birth control/protection: Surgical    Comment: husband vasectomy

## 2019-06-27 ENCOUNTER — Other Ambulatory Visit: Payer: Self-pay | Admitting: Family Medicine

## 2019-07-05 ENCOUNTER — Encounter: Payer: Self-pay | Admitting: Family Medicine

## 2019-07-05 DIAGNOSIS — M5441 Lumbago with sciatica, right side: Secondary | ICD-10-CM

## 2019-07-05 MED ORDER — TRAMADOL HCL 50 MG PO TABS: 50.0000 mg | ORAL_TABLET | Freq: Every evening | ORAL | 0 refills | Status: DC | PRN

## 2019-07-23 ENCOUNTER — Ambulatory Visit
Admission: RE | Admit: 2019-07-23 | Discharge: 2019-07-23 | Disposition: A | Discharge status: Home / Self Care | Payer: Medicaid Other | Source: Ambulatory Visit | Attending: Family Medicine | Admitting: Family Medicine

## 2019-07-23 ENCOUNTER — Other Ambulatory Visit: Payer: Self-pay

## 2019-07-23 DIAGNOSIS — M5441 Lumbago with sciatica, right side: Secondary | ICD-10-CM

## 2019-07-25 ENCOUNTER — Telehealth: Payer: Self-pay | Admitting: Family Medicine

## 2019-07-25 DIAGNOSIS — M5441 Lumbago with sciatica, right side: Secondary | ICD-10-CM

## 2019-07-25 NOTE — Addendum Note (Signed)
Addended by: Hortencia Pilar on: 07/25/2019 11:40 AM   Modules accepted: Orders

## 2019-07-25 NOTE — Telephone Encounter (Signed)
Lumbar MRI scan shows disc bulges at L3-4, L4-5, and L5-S1.  At L5-S1, it makes contact with acute the right and the left S1 nerve roots.  This could be a source of intermittent pain.

## 2019-07-27 ENCOUNTER — Other Ambulatory Visit: Payer: Self-pay | Admitting: Family Medicine

## 2019-07-27 ENCOUNTER — Telehealth: Payer: Self-pay | Admitting: Family Medicine

## 2019-07-27 MED ORDER — TRAMADOL HCL 50 MG PO TABS: 50.0000 mg | ORAL_TABLET | Freq: Every evening | ORAL | 0 refills | Status: DC | PRN

## 2019-07-27 NOTE — Telephone Encounter (Signed)
I called the pharmacy - qty changed to #14, with same instructions of 1-2 hs prn pain (only 7-days' supply is ok with the first Rx). I called the patient to let her know this, as well.

## 2019-07-27 NOTE — Telephone Encounter (Signed)
[name] patient.  

## 2019-07-27 NOTE — Telephone Encounter (Signed)
Received call from [place] with [facility] needing prior authorization for Rx (Tramadol) The number to contact [facility] is [phone]

## 2019-08-08 ENCOUNTER — Other Ambulatory Visit: Payer: Self-pay | Admitting: Family Medicine

## 2019-08-10 ENCOUNTER — Other Ambulatory Visit: Payer: Self-pay

## 2019-08-10 ENCOUNTER — Encounter: Payer: Self-pay | Admitting: Physical Therapy

## 2019-08-10 ENCOUNTER — Ambulatory Visit (INDEPENDENT_AMBULATORY_CARE_PROVIDER_SITE_OTHER): Payer: 59 | Admitting: Physical Therapy

## 2019-08-10 DIAGNOSIS — M6281 Muscle weakness (generalized): Secondary | ICD-10-CM | POA: Diagnosis not present

## 2019-08-10 DIAGNOSIS — M5441 Lumbago with sciatica, right side: Secondary | ICD-10-CM | POA: Diagnosis not present

## 2019-08-10 DIAGNOSIS — R293 Abnormal posture: Secondary | ICD-10-CM

## 2019-08-10 NOTE — Patient Instructions (Signed)
Access Code: [email or web address]  URL: [email or web address]  Date: 08/10/2019  Prepared by: Faustino Congress   Exercises Right Standing Lateral Shift Correction at [place] - 10 reps - 1 sets - 10 sec hold - 3-5x daily - 7x weekly Standing Lumbar Extension - 10 reps - 1 sets - 10 sec hold - 3-5x daily - 7x weekly Standing Glute Med Mobilization with Bisig Ball on Wall - 1-2 reps - 1 sets - 2-3 min hold - 2-3x daily - 7x weekly Patient Education Trigger Point Dry Needling

## 2019-08-10 NOTE — Progress Notes (Signed)
[place]  [phone] [phone]     ID: Kelly Mendez DOB: [birth year]  MR#: [id number]  WHQ#:[id number]  Patient Care Team: [name], NP-C as PCP - General (Family Medicine) [name], MD as Consulting Physician (General Surgery) Trevino Wyatt, Virgie Dad, MD as Consulting Physician (Oncology) Eppie Gibson, MD as Attending Physician (Radiation Oncology) Buena Irish, LCSW as Social Worker (Licensed Clinical Social Worker) [name], MD as Consulting Physician (Gynecologic Oncology) OTHER MD:   CHIEF COMPLAINT: Triple negative breast cancer; DVT  CURRENT TREATMENT: Observation   INTERVAL HISTORY: Kelly Mendez returns today for follow-up of her triple negative breast cancer.  Overall she feels she is doing "better".  Since her last visit, she tells me she slipped and fell in November and she underwent bone scan on 05/20/2019, which showed: patchy increased tracer uptake at calvaria bilaterally, nonspecific; no other worrisome scintigraphic findings; degenerative uptake at shoulders, hips, knees.  She also underwent lumbar spine MRI on 07/23/2019, which showed: spondylosis most notable at L5-S1 with Tinnel central disc protrusion which contacts bilateral descending S1 nerve roots without impingement; moderate bilateral neural foraminal narrowing.  She has had epidural treatments, and currently is taking tramadol Mobic and baclofen and is able to walk.  She still has sciatica on the right and numbness in the right foot laterally.  She will be due for annual Mendez in late March 2021.   REVIEW OF SYSTEMS: Kelly Mendez is taking appropriate pandemic precautions.  Her husband is a Administrator and he carries an app which lets him know if anyone near him carries the virus.  She is helping her 2 children with virtual schooling.  She has had no unusual headaches visual changes cough phlegm production pleurisy or change in bowel or bladder  habits.  There has been no fever rash or bleeding.  A detailed review of systems was otherwise stable.   BREAST CANCER HISTORY: From the original intake note:  Kelly Mendez 10/07/2016 at [facility]. This showed a possible mass in the right breast. On 10/13/2016 she underwent bilateral diagnostic mammography with tomography and right breast ultrasonography. This found the breast density to be category B. In the upper inner quadrant of the right breast there was a circumscribed mass which was barely palpable. Ultrasonography confirmed a 0.9 cm right breast mass at the 1:00 radiant 18 cm from the nipple ultrasound of the axilla showed 1 indeterminate right axillary lymph node with borderline thickening of the anterior cortex.  On 10/16/2016 the patient underwent biopsy of the right breast mass and the suspicious right axillary lymph node. The right breast mass proved to be an invasive ductal carcinoma, grade 3, estrogen and progesterone receptor negative, with an MIB-1 of 80%, and no HER-2 amplification, the signals ratio being 1.30 and the number per cell 1.75. The lymph node was negative and concordant.  Her subsequent history is as detailed below   PAST MEDICAL HISTORY: Past Medical History:  Diagnosis Date  . Abnormal Pap smear of cervix   . Anxiety   . Asthma   . Asymptomatic microscopic hematuria 09/29/2017  . 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 Concussion    around age 75  . Diabetes type 2, uncontrolled ([place])    new diagnosis in 08/2016  . DVT (deep venous thrombosis) ([place]) 11/24/2016   in rt arm  . Family history of breast cancer   . Family  history of neurofibromatosis   . Family history of ovarian cancer   . History of radiation therapy 04/21/17- 05/20/17   Right Breast 40.05 Gy in 15 fractions followed by a 10 Gy boost in 5 fractions to yield a total dose of 50.05 Gy  . HPV in female   . Hyperlipemia   . Malignant  neoplasm of upper-inner quadrant of right breast in female, estrogen receptor negative ([place]) 10/21/2016  . Neuromuscular disorder (HCC)    neuropathy  . Pelvic mass in female 06/16/2017  . Personal history of chemotherapy 2018  . Personal history of radiation therapy 2018  . Pneumonia    2014   . PTSD (post-traumatic stress disorder)     PAST SURGICAL HISTORY: Past Surgical History:  Procedure Laterality Date  . BREAST LUMPECTOMY Right 03/18/2017  . BREAST LUMPECTOMY WITH RADIOACTIVE SEED AND SENTINEL LYMPH NODE BIOPSY Right 03/18/2017   Procedure: RIGHT BREAST LUMPECTOMY WITH RADIOACTIVE SEED AND RIGHT SENTINEL LYMPH NODE BIOPSY;  Surgeon: [name], MD;  Location: [facility];  Service: General;  Laterality: Right;  . COLPOSCOPY    . left ovary removed  1978   was told her ovary was removed but Dr. Denman George found that was not the case. Suspect this was originally an ovarian cystectomy  . PORTACATH PLACEMENT Right 10/29/2016   Procedure: INSERTION PORT-A-CATH WITH [place];  Surgeon: [name], MD;  Location: [facility];  Service: General;  Laterality: Right;  . portacath removal     oct. 5 2018  . ROBOTIC ASSISTED TOTAL HYSTERECTOMY Bilateral 06/16/2017   Procedure: XI ROBOTIC ASSISTED TOTAL HYSTERECTOMY WITH BILATERAL SALPINGECTOMY,  OOPHERECTOMY;  Surgeon: [name], MD;  Location: [facility];  Service: Gynecology;  Laterality: Bilateral;    FAMILY HISTORY Family History  Problem Relation Age of Onset  . Breast cancer Mother 30  . Hepatitis C Father   . Ovarian cancer Maternal Aunt        dx in her 54s  . Neurofibromatosis Maternal Uncle   . Brain cancer Maternal Uncle 58  . Lung cancer Maternal Grandfather   . Kidney failure Paternal Grandmother   . Heart attack Paternal Grandfather   . Ovarian cancer Maternal Aunt        dx in her 24s  . Neurofibromatosis Maternal Aunt   . Ovarian cancer Maternal Aunt   . Neurofibromatosis Maternal Aunt   . Cervical cancer  Maternal Aunt   . Neurofibromatosis Maternal Aunt   . Cancer Maternal Aunt        cancer on the bottom of her foot  . Breast cancer Other        MGF's sisters  . Neurofibromatosis Other        MGM's paternal aunt   The patient's mother was diagnosed with breast cancer at age 66, but tells me the lump in her breast had been present for at least 10 years prior to that. She is now 17 and doing well. The patient's father died at the age of 12 from sepsis following liver transplantation. The patient has 2 brothers, no sisters. The patient tells me that she has at least 5 and sent cousins with breast cancer and there are other relatives with uterine cancer.   GYNECOLOGIC HISTORY:  Patient's last menstrual period was 01/10/2017. Menarche age 53, first live birth age 80, the patient is GX P1. She has had multiple progesterone and oral contraceptive treatments through [facility] because of her menometrorrhagia. 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 She will graduate from [employer] in 2022 with an associates degree in business.  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  Her husband [name] is a truck Geophysicist/field seismologist. The patient's daughter [name] graduated with honors from [employer] and will plan to take a gap year at a veterinary school in the [place], completing organic chemistry and other courses she did not take in college, and then possibly proceeding to veterinary school there. [name] has 3 children, all boys, kim is a cook in [place] and lives independently. The 2 younger boys, [name] and [name], aged 44 and 81 are at home with the patient.     ADVANCED DIRECTIVES: In the absence of any documents to the contrary the patient's husband is her healthcare power of attorney   HEALTH  MAINTENANCE: Social History   Tobacco Use  . Smoking status: Former Smoker    Packs/day: 0.15    Years: 25.00    Pack years: 3.75    Types: Cigarettes    Quit date: 11/25/2016    Years since quitting: 2.7  . Smokeless tobacco: Never Used  Substance Use Topics  . Alcohol use: Yes    Alcohol/week: 1.0 standard drinks    Types: 1 Glasses of wine per week    Comment: rarely   . Drug use: No    Colonoscopy: n/a  PAP:  Bone density:   Allergies  Allergen Reactions  . Lisinopril     Cough, itchy throat  . Penicillins Other (See Comments)    As a child Has patient had a PCN reaction causing immediate rash, facial/tongue/throat swelling, SOB or lightheadedness with hypotension: unknown Has patient had a PCN reaction causing severe rash involving mucus membranes or skin necrosis: unknown Has patient had a PCN reaction that required hospitalization unknown Has patient had a PCN reaction occurring within the last 10 years: no If all of the above answers are "NO", then may proceed with Cephalosporin use.     Current Outpatient Medications  Medication Sig Dispense Refill  . albuterol (PROAIR HFA) 108 (90 Base) MCG/ACT inhaler Inhale 2 puffs into the lungs every 6 (six) hours as needed for wheezing or shortness of breath. 1 Inhaler 0  . atorvastatin (LIPITOR) 20 MG tablet TAKE 1 TABLET (20 MG TOTAL) BY MOUTH DAILY.  90 tablet 0  . baclofen (LIORESAL) 10 MG tablet TAKE 0.5-1 TABLETS (5-10 MG TOTAL) BY MOUTH 3 (THREE) TIMES DAILY AS NEEDED FOR MUSCLE SPASMS.  30 each 3  . Dulaglutide (TRULICITY) 1.5 RK/2.7CW SOPN Inject 1.5 mg into the skin once a week. 4 pen 0  . gabapentin (NEURONTIN) 300 MG capsule Take 1 capsule (300 mg total) by mouth at bedtime. 90 capsule 4  . ibuprofen (ADVIL,MOTRIN) 200 MG tablet Take 400 mg by mouth daily as needed for headache or moderate pain.     . meloxicam (MOBIC) 15 MG tablet Take 0.5-1 tablets (7.5-15 mg total) by mouth daily as needed for pain. 30 tablet  6  . metFORMIN (GLUCOPHAGE) 1000 MG tablet Take 1 tablet (1,000 mg total) by mouth 2 (two) times daily. 120 tablet 0  . tizanidine (ZANAFLEX) 2 MG capsule Take 1 capsule (2 mg total) by mouth 3 (three) times daily. (Patient not taking: Reported on 08/10/2019) 15 capsule 0  . traMADol (ULTRAM) 50  MG tablet TAKE 1-2 TABLETS (50-100 MG TOTAL) BY MOUTH AT BEDTIME AS NEEDED.  14 tablet 0  . venlafaxine XR (EFFEXOR-XR) 150 MG 24 hr capsule TAKE 1 CAPSULE BY MOUTH DAILY WITH BREAKFAST  180 capsule 0   No current facility-administered medications for this visit.    OBJECTIVE: Morbidly obese African-American woman in no acute distress  Vitals:   08/11/19 1513  BP: (!) 130/98  Pulse: (!) 111  Resp: 17  Temp: (!) 97.2 F (36.2 C)  SpO2: 98%     Body mass index is 43.55 kg/m.    ECOG FS:1 - Symptomatic but completely ambulatory  Sclerae unicteric, EOMs intact Wearing a mask No cervical or supraclavicular adenopathy Lungs no rales or rhonchi Heart regular rate and rhythm Abd soft, obese, nontender, positive bowel sounds MSK no focal spinal tenderness, no upper extremity lymphedema Neuro: nonfocal, well oriented, appropriate affect Breasts: The right breast is status post lumpectomy and radiation.  There is no evidence of local recurrence.  The left breast is benign.  Both axillae are benign.  LAB RESULTS:  CMP     Component Value Date/Time   NA 139 08/11/2019 1450   NA 143 09/28/2017 1539   NA 140 05/07/2017 1221   K 4.4 08/11/2019 1450   K 4.0 05/07/2017 1221   CL 103 08/11/2019 1450   CO2 23 08/11/2019 1450   CO2 24 05/07/2017 1221   GLUCOSE 122 (H) 08/11/2019 1450   GLUCOSE 173 (H) 05/07/2017 1221   BUN 13 08/11/2019 1450   BUN 13 09/28/2017 1539   BUN 6.7 (L) 05/07/2017 1221   CREATININE 0.91 08/11/2019 1450   CREATININE 0.8 05/07/2017 1221   CALCIUM 9.8 08/11/2019 1450   CALCIUM 9.7 05/07/2017 1221   PROT 7.6 08/11/2019 1450   PROT 6.8 09/28/2017 1539   PROT 6.8  05/07/2017 1221   ALBUMIN 4.1 08/11/2019 1450   ALBUMIN 4.3 09/28/2017 1539   ALBUMIN 3.4 (L) 05/07/2017 1221   AST 17 08/11/2019 1450   AST 21 05/07/2017 1221   ALT 25 08/11/2019 1450   ALT 24 05/07/2017 1221   ALKPHOS 119 08/11/2019 1450   ALKPHOS 131 05/07/2017 1221   BILITOT 0.4 08/11/2019 1450   BILITOT 0.2 09/28/2017 1539   BILITOT 0.48 05/07/2017 1221   GFRNONAA >60 08/11/2019 1450   GFRAA >60 08/11/2019 1450    No results found for: TOTALPROTELP, ALBUMINELP, A1GS, A2GS, BETS, BETA2SER, GAMS, MSPIKE, SPEI  No results found for: Nils Pyle, [facility]  Lab Results  Component Value Date   WBC 8.2 08/11/2019   NEUTROABS 6.0 08/11/2019   HGB 14.6 08/11/2019   HCT 46.5 (H) 08/11/2019   MCV 82.9 08/11/2019   PLT 323 08/11/2019      Chemistry      Component Value Date/Time   NA 139 08/11/2019 1450   NA 143 09/28/2017 1539   NA 140 05/07/2017 1221   K 4.4 08/11/2019 1450   K 4.0 05/07/2017 1221   CL 103 08/11/2019 1450   CO2 23 08/11/2019 1450   CO2 24 05/07/2017 1221   BUN 13 08/11/2019 1450   BUN 13 09/28/2017 1539   BUN 6.7 (L) 05/07/2017 1221   CREATININE 0.91 08/11/2019 1450   CREATININE 0.8 05/07/2017 1221      Component Value Date/Time   CALCIUM 9.8 08/11/2019 1450   CALCIUM 9.7 05/07/2017 1221   ALKPHOS 119 08/11/2019 1450   ALKPHOS 131 05/07/2017 1221   AST 17 08/11/2019 1450   AST 21  05/07/2017 1221   ALT 25 08/11/2019 1450   ALT 24 05/07/2017 1221   BILITOT 0.4 08/11/2019 1450   BILITOT 0.2 09/28/2017 1539   BILITOT 0.48 05/07/2017 1221       No results found for: LABCA2  No components found for: [id number]  No results for input(s): INR in the last 168 hours.  Urinalysis    Component Value Date/Time   COLORURINE YELLOW 10/29/2017 1532   APPEARANCEUR CLEAR 10/29/2017 1532   LABSPEC 1.015 01/12/2018 1100   PHURINE 5.0 10/29/2017 1532   GLUCOSEU 50 (A) 10/29/2017 1532   HGBUR MODERATE (A) 10/29/2017 1532   BILIRUBINUR  negative 01/12/2018 1100   BILIRUBINUR n 09/29/2016 1045   KETONESUR negative 01/12/2018 1100   KETONESUR NEGATIVE 10/29/2017 1532   PROTEINUR negative 01/12/2018 1100   PROTEINUR 30 (A) 10/29/2017 1532   UROBILINOGEN negative 09/29/2016 1045   NITRITE Negative 01/12/2018 1100   NITRITE NEGATIVE 10/29/2017 1532   LEUKOCYTESUR Negative 01/12/2018 1100     STUDIES: MR Lumbar Spine w/o contrast  Result Date: 07/24/2019 CLINICAL DATA:  Lower back pain, history of breast cancer EXAM: MRI LUMBAR SPINE WITHOUT CONTRAST TECHNIQUE: Multiplanar, multisequence MR imaging of the lumbar spine was performed. No intravenous contrast was administered. COMPARISON:  None. FINDINGS: Segmentation: There are 5 non-rib bearing lumbar type vertebral bodies with the last intervertebral disc space labeled as L5-S1. Alignment:  There is a minimal retrolisthesis of L5 on S1. Vertebrae: The vertebral body heights are well maintained. No fracture, marrow edema,or pathologic marrow infiltration. Conus medullaris and cauda equina: Conus extends to the L1 level. Conus and cauda equina appear normal. Paraspinal and other soft tissues: The paraspinal soft tissues and visualized retroperitoneal structures are unremarkable. The sacroiliac joints are intact. There is a T2 bright lesion seen within the upper pole the right kidney. Disc levels: T12-L1:  No significant canal or neural foraminal narrowing. L1-L2:   No significant canal or neural foraminal narrowing. L2-L3:   No significant canal or neural foraminal narrowing. L3-L4: There is a broad-based disc bulge with ligamentum flavum hypertrophy which causes mild bilateral neural foraminal narrowing. L4-L5: There is a broad-based disc bulge with ligamentum flavum hypertrophy which causes mild bilateral neural foraminal narrowing and mild central canal stenosis. L5-S1: There is a broad-based disc bulge with a central disc protrusion which contacts the bilateral descending S1 nerve roots  without impingement. There is ligamentum flavum hypertrophy and facet arthrosis which causes moderate bilateral neural foraminal narrowing. IMPRESSION: Lumbar spine spondylosis most notable at L5-S1 with a Kirkpatrick central disc protrusion which contacts bilateral descending S1 nerve roots without impingement. There is moderate bilateral neural foraminal narrowing. Electronically Signed   By: Prudencio Pair M.D.   On: 07/24/2019 02:31     ELIGIBLE FOR AVAILABLE RESEARCH PROTOCOL: not a candidate for PREVENT   ASSESSMENT: 44 y.o.  [place] woman status post right breast upper inner quadrant biopsy 10/16/2016 for a clinical T1b pN0, stage IB invasive ductal carcinoma, grade 3, triple negative, with an MIB-1 of 80%.  (1)  genetics testing 12/12/2016 through the  Multi-Gene Panel offered by [facility] found no deleterious mutations in ALK, APC, ATM, AXIN2,BAP1,  BARD1, BLM, BMPR1A, BRCA1, BRCA2, BRIP1, CASR, CDC73, CDH1, CDK4, CDKN1B, CDKN1C, CDKN2A (p14ARF), CDKN2A (p16INK4a), CEBPA, CHEK2, CTNNA1, DICER1, DIS3L2, EGFR (c.2369C>T, p.Thr790Met variant only), EPCAM (Deletion/duplication testing only), FH, FLCN, GATA2, GPC3, GREM1 (Promoter region deletion/duplication testing only), HOXB13 (c.251G>A, p.Gly84Glu), HRAS, KIT, MAX, MEN1, MET, MITF (c.952G>A, p.Glu318Lys variant only), MLH1, MSH2, MSH3, MSH6, MUTYH,  NBN, NF1, NF2, NTHL1, PALB2, PDGFRA, PHOX2B, PMS2, POLD1, POLE, POT1, PRKAR1A, PTCH1, PTEN, RAD50, RAD51C, RAD51D, RB1, RECQL4, RET, RUNX1, SDHAF2, SDHA (sequence changes only), SDHB, SDHC, SDHD, SMAD4, SMARCA4, SMARCB1, SMARCE1, STK11, SUFU, TERT, TERT, TMEM127, TP53, TSC1, TSC2, VHL, WRN and WT1  (a) VUSs noted in BLM c.2340G>A (silent), BRIP1 c.2594G>A (p.Arg865Gln) and MUTYH c.562A>G (p.Glu188Lys)   (2) neoadjuvant chemotherapy consisting of doxorubicin and cyclophosphamide in dose dense fashion  starting 11/05/2016 to be followed by Abraxane weekly  (a), received third cycle 12/03/2016 then proceeded to  weekly Abraxane starting 01/01/2017  (b) Abraxane given from 01/01/17-01/22/17 (4 cycles), stopped early due to peripheral neuropathy  (c) cycle 4 of cyclophosphamide and doxorubicin given at end of Abraxane on 02/12/2017  (3) status post right lumpectomy and sentinel lymph node sampling 03/18/2017 for a ypT1b ypN0 invasive ductal carcinoma, grade 3, with negative margins, and repeat prognostic panel again triple negative  (4) adjuvant radiation completed 05/20/2017 Site/dose:  The right breast was treated to 40.05 Gy in 15 fractions, followed by a 10 Gy boost in 5 fractions to yield a total dose of 50.05 Gy  (5) extensive right upper extremity deep venous thrombosis documented 11/23/2016, treated initially with Lovenox  (a) transitioned to rivaroxaban as of 11/30/2016  (b) total 6 months anticoagulation planned (one month beyond port removal)  (c) d-dimer 08/11/2017 normal  (d) rivaroxaban discontinued late January 2019 for financial reasons  (e) bilateral upper extremity duplex ultrasound 10/14/2018 showed no evidence of residual DVT or superficial vein thrombosis in either upper extremity  (6) tobacco abuse: The patient quit smoking 11/26/2016  (7) menometrorrhagia, with a cystic right adnexal lesion and a possible cervical polyp noted on ultrasound 12/16/2016, with benign endocervical curettage and endometrial biopsy 12/16/2016  (a) CA 125 12/17/2016 was 14.2 (normal).  (b) adnexal mass noted, likely benign, to be explored at the completion of chemotherapy  (c) goserelin started 12/16/2016, continued every 28 days, last dose 06/05/2017  (d) status post hysterectomy and bilateral salpingo-oophorectomy 06/16/2017 with benign pathology  (8) iron deficiency anemia secondary to menometrorrhagia, status post Feraheme 05/20/2017 and 06/01/2017  (9) likely thalassemia trait  (a) on 08/11/2017 hemoglobin was 12.7, MCV 78, and ferritin 188  (10) post traumatic stress disorder  (a) venlafaxine  increased to 150 mg daily as of 02/25/2018    PLAN: Judia is now close to 2-1/2 years out from definitive surgery for her breast cancer with no evidence of disease recurrence.  This is particularly favorable since triple negative breast cancers if they recur tend to recur early.  She has good deal of admission for herself and she is studying herself and encouraging her children to do the same.  They are taking appropriate pandemic precautions.  Once as she starts feeling better from the recent fall she can start exercising regularly and she knows the goal is 45 minutes 5 times a week of any exercise that she prefers so long as it is mildly strenuous.  She is due for mammography in March and I have entered the appropriate orders  Total encounter time 25 minutes.Sarajane Jews C. Kyng Matlock, MD  08/11/19 5:43 PM Medical Oncology and Hematology [facility] [place], [place] [address] Tel. [phone]    Fax. [phone]   I, Wilburn Mylar, am acting as scribe for Dr. Virgie Dad. Fayrene Towner.  I, Lurline Del MD, have reviewed the above documentation for accuracy and completeness, and I agree with the above.    *Total Encounter Time as  defined by the Centers for Medicare and Medicaid Services includes, in addition to the face-to-face time of a patient visit (documented in the note above) non-face-to-face time: obtaining and reviewing outside history, ordering and reviewing medications, tests or procedures, care coordination (communications with other health care professionals or caregivers) and documentation in the medical record.

## 2019-08-10 NOTE — Therapy (Signed)
[facility] [address] [place], [name], [address] Phone: [phone]   Fax:  [phone]  Physical Therapy Evaluation  Patient Details  Name: Kelly Mendez MRN: [id number] Date of Birth: 19-Feb-1976 Referring Provider (PT): Eunice Blase, Mendez   Encounter Date: 08/10/2019  PT End of Session - 08/10/19 1248    Visit Number  1    Number of Visits  12    Date for PT Re-Evaluation  09/21/19    PT Start Time  1147    PT Stop Time  1227    PT Time Calculation (min)  40 min    Activity Tolerance  Patient tolerated treatment well    Behavior During Therapy  Kelly Mendez for tasks assessed/performed       Past Medical History:  Diagnosis Date  . Abnormal Pap smear of cervix   . Anxiety   . Asthma   . Asymptomatic microscopic hematuria 09/29/2017  . 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 Concussion    around age 29  . Diabetes type 2, uncontrolled ([place])    new diagnosis in 08/2016  . DVT (deep venous thrombosis) ([place]) 11/24/2016   in rt arm  . Family history of breast cancer   . Family history of neurofibromatosis   . Family history of ovarian cancer   . History of radiation therapy 04/21/17- 05/20/17   Right Breast 40.05 Gy in 15 fractions followed by a 10 Gy boost in 5 fractions to yield a total dose of 50.05 Gy  . HPV in female   . Hyperlipemia   . Malignant neoplasm of upper-inner quadrant of right breast in female, estrogen receptor negative ([place]) 10/21/2016  . Neuromuscular disorder (HCC)    neuropathy  . Pelvic mass in female 06/16/2017  . Personal history of chemotherapy 2018  . Personal history of radiation therapy 2018  . Pneumonia    2014   . PTSD (post-traumatic stress disorder)     Past Surgical History:  Procedure Laterality Date  . BREAST LUMPECTOMY Right 03/18/2017  . BREAST LUMPECTOMY WITH RADIOACTIVE SEED AND SENTINEL LYMPH NODE BIOPSY Right 03/18/2017   Procedure: RIGHT BREAST  LUMPECTOMY WITH RADIOACTIVE SEED AND RIGHT SENTINEL LYMPH NODE BIOPSY;  Surgeon: Kelly Luna, Mendez;  Location: [place];  Service: General;  Laterality: Right;  . COLPOSCOPY    . left ovary removed  1978   was told her ovary was removed but Dr. Denman George found that was not the case. Suspect this was originally an ovarian cystectomy  . PORTACATH PLACEMENT Right 10/29/2016   Procedure: INSERTION PORT-A-CATH WITH [place];  Surgeon: Kelly Luna, Mendez;  Location: [place];  Service: General;  Laterality: Right;  . portacath removal     oct. 5 2018  . ROBOTIC ASSISTED TOTAL HYSTERECTOMY Bilateral 06/16/2017   Procedure: XI ROBOTIC ASSISTED TOTAL HYSTERECTOMY WITH BILATERAL SALPINGECTOMY,  OOPHERECTOMY;  Surgeon: Kelly Amber, Mendez;  Location[facility];  Service: Gynecology;  Laterality: Bilateral;    There were no vitals filed for this visit.   Subjective Assessment - 08/10/19 1149    Subjective  Pt is a 44 y/o female who presents to OPPT for sudden onset of Rt sided LBP with radiculopathy x ~7-8 weeks.  Pt reports tramadol, baclofen and mobic has helped with back pain, but radicular pain is constant.  Pain is the worst with transition from sit to stand, and unable to go up stairs leading with RLE.  Limitations  Standing;Walking    How long can you stand comfortably?  5 min    How long can you walk comfortably?  10 min    Diagnostic tests  Lumbar spine spondylosis most notable at L5-S1 with a Kleiber centraldisc protrusion which contacts bilateral descending S1 nerve rootswithout impingement. There is moderate bilateral neural foraminal narrowing.    Patient Stated Goals  improve pain    Currently in Pain?  Yes    Pain Score  3    up to 12/10; at best 3/10   Pain Location  Back    Pain Orientation  Lower;Right    Pain Descriptors / Indicators  Cramping;Spasm    Pain Type  Acute pain    Pain Radiating Towards  numbness to 4th/5th toes,  pain to mid calf    Pain Onset  More than a month ago     Pain Frequency  Constant    Aggravating Factors   sit to stand, standing, walking, rolling in bed    Pain Relieving Factors  medication, heat, salonpas patches         [facility] PT Assessment - 08/10/19 1156      Assessment   Medical Diagnosis  M54.41 (ICD-10-CM) - Acute right-sided low back pain with right-sided sciatica    Referring Provider (PT)  Kelly Mendez    Onset Date/Surgical Date  --   Dec 2020   Hand Dominance  Right    Next Mendez Visit  PRN    Prior Therapy  none      Precautions   Precautions  None      Restrictions   Weight Bearing Restrictions  No      Balance Screen   Has the patient fallen in the past 6 months  No    Has the patient had a decrease in activity level because of a fear of falling?   No    Is the patient reluctant to leave their home because of a fear of falling?   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    Leisure  walking 15-20 min; 3-4x /wk      Cognition   Overall Cognitive Status  Within Functional Limits for tasks assessed      Posture/Postural Control   Posture/Postural Control  Postural limitations    Postural Limitations  Rounded Shoulders;Forward head      ROM / Strength   AROM / PROM / Strength  AROM;Strength      AROM   Overall AROM Comments  Lumbar ROM WNL; pain at end range extension and Rt side bending    AROM Assessment Site  Lumbar      Strength   Strength Assessment Site  Hip;Knee;Ankle    Right/Left Hip  Right;Left    Right Hip Flexion  5/5    Right Hip Extension  3-/5    Right Hip ABduction  4/5    Left Hip Flexion  5/5    Left Hip Extension  4/5    Right/Left Knee  Right;Left    Right  Knee Flexion  5/5  Right Knee Extension  5/5    Left Knee Flexion  5/5    Left Knee Extension  5/5    Right/Left Ankle  Right;Left    Right Ankle Dorsiflexion  5/5    Left Ankle Dorsiflexion  5/5      Flexibility   Soft Tissue Assessment /Muscle Length  yes    Hamstrings  WNL: trigger points noted in Rt distal hamstring      Palpation   Spinal mobility  mild pain with overpressure lumbar spine; otherwise WNL    Palpation comment  trigger points noted in Rt glute med      Special Tests    Special Tests  Lumbar    Lumbar Tests  Slump Test;Straight Leg Raise      Slump test   Findings  Negative    Comment  increased pain on Rt; no change to symptoms with cervical extension      Straight Leg Raise   Findings  Negative                Objective measurements completed on examination: See above findings.      [place] Adult PT Treatment/Exercise - 08/10/19 1156      Self-Care   Self-Care  Other Self-Care Comments    Other Self-Care Comments   use of tennis ball for self STM to glutes/piriformis      Exercises   Exercises  Lumbar      Lumbar Exercises: Stretches   Standing Extension  5 reps;10 seconds    Other Lumbar Stretch Exercise  Rt lateral shift correction 5x10 sec             PT Education - 08/10/19 1247    Education Details  HEP, DN    Person(s) Educated  Patient    Methods  Explanation;Demonstration;Handout    Comprehension  Verbalized understanding;Returned demonstration;Need further instruction          PT Long Term Goals - 08/10/19 1251      PT LONG TERM GOAL #1   Title  independent with HEP    Status  New    Target Date  09/21/19      PT LONG TERM GOAL #2   Title  negotiate stairs reciprocially without increase in pain for improved function    Status  New    Target Date  09/21/19      PT LONG TERM GOAL #3   Title  perform lumbar ROM without increase in pain    Status  New    Target Date  09/21/19      PT LONG TERM GOAL #4    Title  report centralization of symptoms for improved function    Status  New    Target Date  09/21/19      PT LONG TERM GOAL #5   Title  demonstrate 4+/5 Rt hip strength for improved function    Status  New    Target Date  09/21/19             Plan - 08/10/19 1248    Clinical Impression Statement  Pt is a 44 y/o female who presents to OPPT for acute LBP x 6-8 weeks without known injury.  Pt demonstrates dereased strength and radicular pain with active trigger points, and postural abnormalities affecting functional mobilty. Pt will benefit from PT to address deficits listed.    Personal Factors and Comorbidities  Comorbidity 3+    Comorbidities  PTSD, anxiety, DVT of RUE, DM,  breast cx (2018), obesity, asthma    Examination-Activity Limitations  Bathing;Transfers;Sit;Stairs;Stand;Bend;Bed Mobility;Locomotion Level    Examination-Participation Restrictions  Meal Prep;Other   occupation   Stability/Clinical Decision Making  Evolving/Moderate complexity    Clinical Decision Making  Moderate    Rehab Potential  Good    PT Frequency  2x / week    PT Duration  6 weeks    PT Treatment/Interventions  ADLs/Self Care Home Management;Cryotherapy;Electrical Stimulation;Traction;Moist Heat;Gait training;Stair training;Functional mobility training;Therapeutic activities;Therapeutic exercise;Patient/family education;Manual techniques;Taping;Dry needling    PT Next Visit Plan  review HEP and see how symptoms are, progress to core/hip strengthening, manual/DN PRN, consider traction?    PT Home Exercise Plan  Access Code: [email or web address]    Consulted and Agree with Plan of Care  Patient       Patient will benefit from skilled therapeutic intervention in order to improve the following deficits and impairments:  Increased fascial restricitons, Increased muscle spasms, Postural dysfunction, Pain, Hypermobility, Decreased activity tolerance, Decreased strength  Visit Diagnosis: Acute right-sided low  back pain with right-sided sciatica - Plan: PT plan of care cert/re-cert  Muscle weakness (generalized) - Plan: PT plan of care cert/re-cert  Abnormal posture - Plan: PT plan of care cert/re-cert     Problem List Patient Active Problem List   Diagnosis Date Noted  . Hot flashes 08/13/2018  . Snoring 04/15/2018  . Post traumatic stress disorder (PTSD) 02/25/2018  . Anxiety   . Asymptomatic microscopic hematuria 09/29/2017  . Iron deficiency anemia 08/06/2017  . Adnexal mass, RIGHT 12/16/2016  . Menorrhagia 12/16/2016  . Genetic testing 12/15/2016  . Family history of breast cancer   . Family history of ovarian cancer   . Family history of neurofibromatosis   . Acute deep vein thrombosis (DVT) of axillary vein of right upper extremity ([place]) 11/26/2016  . Type 2 diabetes mellitus with diabetic polyneuropathy, without long-term current use of insulin ([place])   . Malignant neoplasm of upper-inner quadrant of right breast in female, estrogen receptor negative ([place]) 10/21/2016  . Low HDL (under 40) 09/16/2016  . ASCVD (arteriosclerotic cardiovascular disease) 09/16/2016  . Light cigarette smoker 09/15/2016  . Morbid obesity ([place]) 09/15/2016  . Mild intermittent asthma 09/15/2016        Laureen Abrahams, PT, DPT 08/10/19 12:56 PM     [facility] [address] [place], [name], [address] Phone: [phone]   Fax:  [phone]  Name: Jolonda Rowling MRN: [id number] Date of Birth: 1975/12/24

## 2019-08-11 ENCOUNTER — Other Ambulatory Visit: Payer: Self-pay

## 2019-08-11 ENCOUNTER — Inpatient Hospital Stay: Payer: 59

## 2019-08-11 ENCOUNTER — Inpatient Hospital Stay: Payer: 59 | Attending: Oncology | Admitting: Oncology

## 2019-08-11 VITALS — BP 130/98 | HR 111 | Temp 97.2°F | Resp 17 | Ht 74.0 in | Wt 339.2 lb

## 2019-08-11 DIAGNOSIS — F4310 Post-traumatic stress disorder, unspecified: Secondary | ICD-10-CM

## 2019-08-11 DIAGNOSIS — M4316 Spondylolisthesis, lumbar region: Secondary | ICD-10-CM | POA: Insufficient documentation

## 2019-08-11 DIAGNOSIS — Z9181 History of falling: Secondary | ICD-10-CM | POA: Diagnosis not present

## 2019-08-11 DIAGNOSIS — E1142 Type 2 diabetes mellitus with diabetic polyneuropathy: Secondary | ICD-10-CM

## 2019-08-11 DIAGNOSIS — R0683 Snoring: Secondary | ICD-10-CM

## 2019-08-11 DIAGNOSIS — C50211 Malignant neoplasm of upper-inner quadrant of right female breast: Secondary | ICD-10-CM | POA: Insufficient documentation

## 2019-08-11 DIAGNOSIS — Z171 Estrogen receptor negative status [ER-]: Secondary | ICD-10-CM

## 2019-08-11 DIAGNOSIS — J4520 Mild intermittent asthma, uncomplicated: Secondary | ICD-10-CM

## 2019-08-11 DIAGNOSIS — Z87891 Personal history of nicotine dependence: Secondary | ICD-10-CM | POA: Diagnosis not present

## 2019-08-11 DIAGNOSIS — Z86718 Personal history of other venous thrombosis and embolism: Secondary | ICD-10-CM | POA: Diagnosis not present

## 2019-08-11 DIAGNOSIS — D508 Other iron deficiency anemias: Secondary | ICD-10-CM

## 2019-08-11 LAB — CBC WITH DIFFERENTIAL/PLATELET
Abs Immature Granulocytes: 0.03 10*3/uL (ref 0.00–0.07)
Basophils Absolute: 0 10*3/uL (ref 0.0–0.1)
Basophils Relative: 1 %
Eosinophils Absolute: 0.1 10*3/uL (ref 0.0–0.5)
Eosinophils Relative: 1 %
HCT: 46.5 % — ABNORMAL HIGH (ref 36.0–46.0)
Hemoglobin: 14.6 g/dL (ref 12.0–15.0)
Immature Granulocytes: 0 %
Lymphocytes Relative: 19 %
Lymphs Abs: 1.6 10*3/uL (ref 0.7–4.0)
MCH: 26 pg (ref 26.0–34.0)
MCHC: 31.4 g/dL (ref 30.0–36.0)
MCV: 82.9 fL (ref 80.0–100.0)
Monocytes Absolute: 0.4 10*3/uL (ref 0.1–1.0)
Monocytes Relative: 5 %
Neutro Abs: 6 10*3/uL (ref 1.7–7.7)
Neutrophils Relative %: 74 %
Platelets: 323 10*3/uL (ref 150–400)
RBC: 5.61 MIL/uL — ABNORMAL HIGH (ref 3.87–5.11)
RDW: 14.3 % (ref 11.5–15.5)
WBC: 8.2 10*3/uL (ref 4.0–10.5)
nRBC: 0 % (ref 0.0–0.2)

## 2019-08-11 LAB — COMPREHENSIVE METABOLIC PANEL
ALT: 25 U/L (ref 0–44)
AST: 17 U/L (ref 15–41)
Albumin: 4.1 g/dL (ref 3.5–5.0)
Alkaline Phosphatase: 119 U/L (ref 38–126)
Anion gap: 13 (ref 5–15)
BUN: 13 mg/dL (ref 6–20)
CO2: 23 mmol/L (ref 22–32)
Calcium: 9.8 mg/dL (ref 8.9–10.3)
Chloride: 103 mmol/L (ref 98–111)
Creatinine, Ser: 0.91 mg/dL (ref 0.44–1.00)
GFR calc Af Amer: >60 mL/min (ref >60)
GFR calc non Af Amer: >60 mL/min (ref >60)
Glucose, Bld: 122 mg/dL — ABNORMAL HIGH (ref 70–99)
Potassium: 4.4 mmol/L (ref 3.5–5.1)
Sodium: 139 mmol/L (ref 135–145)
Total Bilirubin: 0.4 mg/dL (ref 0.3–1.2)
Total Protein: 7.6 g/dL (ref 6.5–8.1)

## 2019-08-12 ENCOUNTER — Telehealth: Payer: Self-pay | Admitting: Oncology

## 2019-08-12 NOTE — Telephone Encounter (Signed)
I talk with patient regarding schedule  

## 2019-08-15 ENCOUNTER — Other Ambulatory Visit: Payer: Self-pay | Admitting: Family Medicine

## 2019-08-19 ENCOUNTER — Other Ambulatory Visit: Payer: Self-pay

## 2019-08-19 ENCOUNTER — Ambulatory Visit (INDEPENDENT_AMBULATORY_CARE_PROVIDER_SITE_OTHER): Payer: 59 | Admitting: Internal Medicine

## 2019-08-19 ENCOUNTER — Encounter: Payer: Self-pay | Admitting: Internal Medicine

## 2019-08-19 VITALS — BP 136/86 | HR 97 | Temp 98.7°F | Ht 74.0 in | Wt 335.2 lb

## 2019-08-19 DIAGNOSIS — E1140 Type 2 diabetes mellitus with diabetic neuropathy, unspecified: Secondary | ICD-10-CM | POA: Insufficient documentation

## 2019-08-19 DIAGNOSIS — E1165 Type 2 diabetes mellitus with hyperglycemia: Secondary | ICD-10-CM

## 2019-08-19 DIAGNOSIS — E1142 Type 2 diabetes mellitus with diabetic polyneuropathy: Secondary | ICD-10-CM | POA: Diagnosis not present

## 2019-08-19 LAB — POCT GLYCOSYLATED HEMOGLOBIN (HGB A1C): Hemoglobin A1C: 7.5 % — AB (ref 4.0–5.6)

## 2019-08-19 LAB — GLUCOSE, POCT (MANUAL RESULT ENTRY): POC Glucose: 188 mg/dl — AB (ref 70–99)

## 2019-08-19 MED ORDER — TRULICITY 1.5 MG/0.5ML ~~LOC~~ SOAJ: 1.5000 mg | SUBCUTANEOUS | 3 refills | Status: DC

## 2019-08-19 MED ORDER — METFORMIN HCL ER 500 MG PO TB24: 1000.0000 mg | ORAL_TABLET | Freq: Two times a day (BID) | ORAL | 3 refills | Status: DC

## 2019-08-19 MED ORDER — FREESTYLE LIBRE 2 SENSOR MISC: 1.0000 | 11 refills | Status: DC

## 2019-08-19 NOTE — Progress Notes (Addendum)
Name: Kelly Mendez  Age/ Sex: 44 y.o., female   MRN/ DOB: [id number], [birth year]     PCP: [name], NP-C   Reason for Endocrinology Evaluation: Type 2 Diabetes Mellitus     Initial Endocrinology Clinic Visit: 09/03/2018    PATIENT IDENTIFIER: Kelly Mendez is a 44 y.o. female with a past medical history of Right breast ca (03/19/2017 S/P lumpectomy, chemo and radiation) and T2DM. The patient has followed with Endocrinology clinic since 09/03/2018 for consultative assistance with management of her diabetes.  DIABETIC HISTORY:  [name] was diagnosed with T2DM in 2018. She has been on metformin since diagnosis, trulicity added in [id number] , she has not been on insulin. Her hemoglobin A1c has ranged from 6.7% in 2019, peaking at 8.8% in 2018.    SUBJECTIVE:   During the last visit (12/02/2018): This was a virtual visit, we continued metformin and Trulicity     Today ([id number]): Kelly Mendez is here for a follow up on her diabetes management.She has not been seen at our office since 11/2018.  She has not been checking her sugar since her last visit due to lack of health insurance. She has not had trulicity since [id number]. The patient has not had hypoglycemic episodes since the last clinic visit. Otherwise, the patient has not required any recent emergency interventions for hypoglycemia and has not had recent hospitalizations secondary to hyper or hypoglycemic episodes.    She has been diagnosed with a pinched nerve in her back   ROS: As per HPI and as detailed below: Review of Systems  Constitutional: Negative for chills and fever.  HENT: Negative for congestion and sore throat.   Respiratory: Negative for cough and shortness of breath.   Cardiovascular: Negative for chest pain and palpitations.  Gastrointestinal: Negative for diarrhea and nausea.      HOME DIABETES REGIMEN:   Metformin 1000 mg twice daily  Trulicity to 1.5 mg weekly  (Saturday)- not taking since    Statin: yes ACE-I/ARB: no     DIABETIC COMPLICATIONS: Microvascular complications:   Neuropathy   Denies: CKD, retinopathy  Last eye exam: Completed 2018  Macrovascular complications:    Denies: CAD, PVD, CVA  PHYSICAL EXAM: VS: BP 136/86 (BP Location: Left Arm, Patient Position: Sitting, Cuff Size: Large)   Pulse 97   Temp 98.7 F (37.1 C)   Ht 6\' 2"  (1.88 m)   Wt (!) 335 lb 3.2 oz (152 kg)   LMP 01/10/2017   SpO2 98%   BMI 43.04 kg/m    EXAM: General: Pt appears well and is in NAD  Lungs: Clear with good BS bilat with no rales, rhonchi, or wheezes  Abdomen: Normoactive bowel sounds, soft, nontender, without masses or organomegaly palpable  Extremities:  BL LE: no pretibial edema normal   Skin: Hair: texture and amount normal with gender appropriate distribution Skin Inspection: no rashes   Mental Status: Judgment, insight: intact Orientation: oriented to time, place, and person Mood and affect: no depression, anxiety, or agitation   DM Foot exam 08/19/2019 The skin of the feet is intact without sores or ulcerations. The pedal pulses are 2+ on right and 2+ on left. The sensation is decreased to a screening 5.07, 10 gram monofilament bilaterally   HISTORY:  Past Medical History:  Past Medical History:  Diagnosis Date  . Abnormal Pap smear of cervix   . Anxiety   . Asthma   . Asymptomatic microscopic hematuria 09/29/2017  .  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 Concussion    around age 80  . Diabetes type 2, uncontrolled ([place])    new diagnosis in 08/2016  . DVT (deep venous thrombosis) ([place]) 11/24/2016   in rt arm  . Family history of breast cancer   . Family history of neurofibromatosis   . Family history of ovarian cancer   . History of radiation therapy 04/21/17- 05/20/17   Right Breast 40.05 Gy in 15 fractions followed by a 10 Gy boost in 5 fractions to yield a total dose of 50.05  Gy  . HPV in female   . Hyperlipemia   . Malignant neoplasm of upper-inner quadrant of right breast in female, estrogen receptor negative ([place]) 10/21/2016  . Neuromuscular disorder (HCC)    neuropathy  . Pelvic mass in female 06/16/2017  . Personal history of chemotherapy 2018  . Personal history of radiation therapy 2018  . Pneumonia    2014   . PTSD (post-traumatic stress disorder)    Past Surgical History:  Past Surgical History:  Procedure Laterality Date  . BREAST LUMPECTOMY Right 03/18/2017  . BREAST LUMPECTOMY WITH RADIOACTIVE SEED AND SENTINEL LYMPH NODE BIOPSY Right 03/18/2017   Procedure: RIGHT BREAST LUMPECTOMY WITH RADIOACTIVE SEED AND RIGHT SENTINEL LYMPH NODE BIOPSY;  Surgeon: [name], MD;  Location: [facility];  Service: General;  Laterality: Right;  . COLPOSCOPY    . left ovary removed  1978   was told her ovary was removed but Dr. Denman George found that was not the case. Suspect this was originally an ovarian cystectomy  . PORTACATH PLACEMENT Right 10/29/2016   Procedure: INSERTION PORT-A-CATH WITH [place];  Surgeon: [name], MD;  Location: [place];  Service: General;  Laterality: Right;  . portacath removal     oct. 5 2018  . ROBOTIC ASSISTED TOTAL HYSTERECTOMY Bilateral 06/16/2017   Procedure: XI ROBOTIC ASSISTED TOTAL HYSTERECTOMY WITH BILATERAL SALPINGECTOMY,  OOPHERECTOMY;  Surgeon: [name], MD;  [facility];  Service: Gynecology;  Laterality: Bilateral;    Social History:  reports that she quit smoking about 2 years ago. Her smoking use included cigarettes. She has a 3.75 pack-year smoking history. She has never used smokeless tobacco. She reports current alcohol use of about 1.0 standard drinks of alcohol per week. She reports that she does not use drugs. Family History:  Family History  Problem Relation Age of Onset  . Breast cancer Mother 19  . Hepatitis C Father   . Ovarian cancer Maternal Aunt        dx in her 38s  .  Neurofibromatosis Maternal Uncle   . Brain cancer Maternal Uncle 58  . Lung cancer Maternal Grandfather   . Kidney failure Paternal Grandmother   . Heart attack Paternal Grandfather   . Ovarian cancer Maternal Aunt        dx in her 20s  . Neurofibromatosis Maternal Aunt   . Ovarian cancer Maternal Aunt   . Neurofibromatosis Maternal Aunt   . Cervical cancer Maternal Aunt   . Neurofibromatosis Maternal Aunt   . Cancer Maternal Aunt        cancer on the bottom of her foot  . Breast cancer Other        MGF's sisters  . Neurofibromatosis Other        MGM's paternal aunt     HOME MEDICATIONS: Allergies as of 08/19/2019      Reactions  Lisinopril    Cough, itchy throat   Penicillins Other (See Comments)   As a child Has patient had a PCN reaction causing immediate rash, facial/tongue/throat swelling, SOB or lightheadedness with hypotension: unknown Has patient had a PCN reaction causing severe rash involving mucus membranes or skin necrosis: unknown Has patient had a PCN reaction that required hospitalization unknown Has patient had a PCN reaction occurring within the last 10 years: no If all of the above answers are "NO", then may proceed with Cephalosporin use.      Medication List       Accurate as of August 19, 2019  4:21 PM. If you have any questions, ask your nurse or doctor.        STOP taking these medications   metFORMIN 1000 MG tablet Commonly known as: GLUCOPHAGE Replaced by: metFORMIN 500 MG 24 hr tablet Stopped by: Dorita Sciara, MD   tizanidine 2 MG capsule Commonly known as: Zanaflex Stopped by: Dorita Sciara, MD     TAKE these medications   albuterol 108 (90 Base) MCG/ACT inhaler Commonly known as: ProAir HFA Inhale 2 puffs into the lungs every 6 (six) hours as needed for wheezing or shortness of breath.   atorvastatin 20 MG tablet Commonly known as: LIPITOR TAKE 1 TABLET (20 MG TOTAL) BY MOUTH DAILY.   baclofen 10 MG  tablet Commonly known as: LIORESAL TAKE 0.5-1 TABLETS (5-10 MG TOTAL) BY MOUTH 3 (THREE) TIMES DAILY AS NEEDED FOR MUSCLE SPASMS.   FreeStyle Libre 2 Sensor Misc 1 Device by Does not apply route as directed. Started by: Dorita Sciara, MD   gabapentin 300 MG capsule Commonly known as: NEURONTIN Take 1 capsule (300 mg total) by mouth at bedtime.   ibuprofen 200 MG tablet Commonly known as: ADVIL Take 400 mg by mouth daily as needed for headache or moderate pain.   meloxicam 15 MG tablet Commonly known as: MOBIC Take 0.5-1 tablets (7.5-15 mg total) by mouth daily as needed for pain.   metFORMIN 500 MG 24 hr tablet Commonly known as: GLUCOPHAGE-XR Take 2 tablets (1,000 mg total) by mouth 2 (two) times daily. Replaces: metFORMIN 1000 MG tablet Started by: Dorita Sciara, MD   traMADol 50 MG tablet Commonly known as: ULTRAM TAKE 1-2 TABLETS (50-100 MG TOTAL) BY MOUTH AT BEDTIME AS NEEDED.   Trulicity 1.5 0000000 Sopn Generic drug: Dulaglutide Inject 1.5 mg into the skin once a week.   venlafaxine XR 150 MG 24 hr capsule Commonly known as: EFFEXOR-XR TAKE 1 CAPSULE BY MOUTH DAILY WITH BREAKFAST         DATA REVIEWED:  Lab Results  Component Value Date   HGBA1C 7.5 (A) 08/19/2019   HGBA1C 7.0 (A) 08/13/2018   HGBA1C 6.7 (A) 01/12/2018   Lab Results  Component Value Date   MICROALBUR 4.5 09/15/2016   LDLCALC 47 08/13/2018   CREATININE 0.91 08/11/2019   Lab Results  Component Value Date   MICRALBCREAT 21 08/13/2018     Lab Results  Component Value Date   CHOL 91 (L) 08/13/2018   HDL 28 (L) 08/13/2018   LDLCALC 47 08/13/2018   TRIG 81 08/13/2018   CHOLHDL 3.3 08/13/2018         ASSESSMENT / PLAN / RECOMMENDATIONS:   1) Type 2 Diabetes Mellitus, Sub-optimally controlled, With neuropathic complications - Most recent A1c of 7.5 %. Goal A1c <7.0 %.  -The patient has lost health insurance for few months in 2020, and has been without  Trulicity, she has been taking metformin in the past few months -We will restart Trulicity as below -She was encouraged to continue glucose checks, a freestyle libre sensor has been sent to her pharmacy -Patient strongly encouraged to have an eye exam -She would like to switch her regular metformin to extended release   MEDICATIONS:  Metformin 123XX123 mg BID   Trulicity 1.5 mg weekly  EDUCATION / INSTRUCTIONS:  BG monitoring instructions: Patient is instructed to check her blood sugars 2 times a day, fasting and bedtime.  Call [facility] if: BG persistently < 70 or > 300. . I reviewed the Rule of 15 for the treatment of hypoglycemia in detail with the patient. Literature supplied.     2) Diabetic complications:   Eye: Does not have known diabetic retinopathy.  Patient encouraged to have an eye exam ASAP  Neuro/ Feet: Does have known diabetic peripheral neuropathy .  Renal: Patient does not have known baseline CKD. She   is not on an ACEI/ARB at present.  She is past due for BMP microalbumin urea testing and lipid check, patient stated she will see her PCP for a physical and labs will be drawn then    F/U in 4 months    Signed electronically by: Mack Guise, MD  [facility]  [address] [place], [place] [address] Phone: [phone] FAX: [phone]   CC: [name], NP-C [name] [address] Phone: [phone]  Fax: [phone]  Return to Endocrinology clinic as below: Future Appointments  Date Time Provider [place]  09/05/2019  9:30 AM Faustino Congress F, PT [facility] None  09/13/2019  9:30 AM Laureen Abrahams, PT [facility] None  09/16/2019 10:15 AM Elsie Ra R, PT [facility] None  12/19/2019  9:10 AM Agnes Brightbill, Melanie Crazier, MD [facility] None  08/13/2020  1:00 PM [facility] 2 [facility] None  08/13/2020  1:30 PM Magrinat, Virgie Dad, MD  [facility] None

## 2019-08-19 NOTE — Patient Instructions (Signed)
-   Increase trulicity to 1.5 mg weekly  - Will switch the Metformin to the extended release per your request, please make sure you check with the pharmacy about recalls - Metformin XR 500 mg 2 tablets twice daily  - Restart Trulicity 1.5 mg weekly     - Check sugar before breakfast and Bedtime    HOW TO TREAT LOW BLOOD SUGARS (Blood sugar LESS THAN 70 MG/DL)  Please follow the RULE OF 15 for the treatment of hypoglycemia treatment (when your (blood sugars are less than 70 mg/dL)    STEP 1: Take 15 grams of carbohydrates when your blood sugar is low, which includes:   3-4 GLUCOSE TABS  OR  3-4 OZ OF JUICE OR REGULAR SODA OR  ONE TUBE OF GLUCOSE GEL     STEP 2: RECHECK blood sugar in 15 MINUTES STEP 3: If your blood sugar is still low at the 15 minute recheck --> then, go back to STEP 1 and treat AGAIN with another 15 grams of carbohydrates.

## 2019-08-24 ENCOUNTER — Other Ambulatory Visit: Payer: Self-pay | Admitting: Family Medicine

## 2019-09-05 ENCOUNTER — Ambulatory Visit (INDEPENDENT_AMBULATORY_CARE_PROVIDER_SITE_OTHER): Payer: 59 | Admitting: Physical Therapy

## 2019-09-05 ENCOUNTER — Other Ambulatory Visit: Payer: Self-pay | Admitting: Family Medicine

## 2019-09-05 ENCOUNTER — Encounter: Payer: Self-pay | Admitting: Physical Therapy

## 2019-09-05 ENCOUNTER — Other Ambulatory Visit: Payer: Self-pay

## 2019-09-05 DIAGNOSIS — M6281 Muscle weakness (generalized): Secondary | ICD-10-CM | POA: Diagnosis not present

## 2019-09-05 DIAGNOSIS — R293 Abnormal posture: Secondary | ICD-10-CM | POA: Diagnosis not present

## 2019-09-05 DIAGNOSIS — M5441 Lumbago with sciatica, right side: Secondary | ICD-10-CM | POA: Diagnosis not present

## 2019-09-05 NOTE — Therapy (Signed)
[facility] [address] [place], [name], [address] Phone: [phone]   Fax:  [phone]  Physical Therapy Treatment  Patient Details  Name: Patches Mcdonnell MRN: [id number] Date of Birth: 10-25-1975 Referring Provider (PT): Eunice Blase, MD   Encounter Date: 09/05/2019  PT End of Session - 09/05/19 1119    Visit Number  2    Number of Visits  12    Date for PT Re-Evaluation  09/21/19    PT Start Time  0938    PT Stop Time  1012    PT Time Calculation (min)  34 min    Activity Tolerance  Patient tolerated treatment well    Behavior During Therapy  [facility] for tasks assessed/performed       Past Medical History:  Diagnosis Date  . Abnormal Pap smear of cervix   . Anxiety   . Asthma   . Asymptomatic microscopic hematuria 09/29/2017  . 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 Concussion    around age 60  . Diabetes type 2, uncontrolled ([place])    new diagnosis in 08/2016  . DVT (deep venous thrombosis) ([place]) 11/24/2016   in rt arm  . Family history of breast cancer   . Family history of neurofibromatosis   . Family history of ovarian cancer   . History of radiation therapy 04/21/17- 05/20/17   Right Breast 40.05 Gy in 15 fractions followed by a 10 Gy boost in 5 fractions to yield a total dose of 50.05 Gy  . HPV in female   . Hyperlipemia   . Malignant neoplasm of upper-inner quadrant of right breast in female, estrogen receptor negative ([place]) 10/21/2016  . Neuromuscular disorder (HCC)    neuropathy  . Pelvic mass in female 06/16/2017  . Personal history of chemotherapy 2018  . Personal history of radiation therapy 2018  . Pneumonia    2014   . PTSD (post-traumatic stress disorder)     Past Surgical History:  Procedure Laterality Date  . BREAST LUMPECTOMY Right 03/18/2017  . BREAST LUMPECTOMY WITH RADIOACTIVE SEED AND SENTINEL LYMPH NODE BIOPSY Right 03/18/2017   Procedure: RIGHT BREAST  LUMPECTOMY WITH RADIOACTIVE SEED AND RIGHT SENTINEL LYMPH NODE BIOPSY;  Surgeon: [name], MD;  Location: [place];  Service: General;  Laterality: Right;  . COLPOSCOPY    . left ovary removed  1978   was told her ovary was removed but Dr. Denman George found that was not the case. Suspect this was originally an ovarian cystectomy  . PORTACATH PLACEMENT Right 10/29/2016   Procedure: INSERTION PORT-A-CATH WITH [place];  Surgeon: [name], MD;  Location: [place];  Service: General;  Laterality: Right;  . portacath removal     oct. 5 2018  . ROBOTIC ASSISTED TOTAL HYSTERECTOMY Bilateral 06/16/2017   Procedure: XI ROBOTIC ASSISTED TOTAL HYSTERECTOMY WITH BILATERAL SALPINGECTOMY,  OOPHERECTOMY;  Surgeon: [name], MD;  Location: [facility];  Service: Gynecology;  Laterality: Bilateral;    There were no vitals filed for this visit.  Subjective Assessment - 09/05/19 0938    Subjective  "I'm not in as much pain as last time." still having some good days/bad days.  today is a good day so far.    Limitations  Standing;Walking    How long can you stand comfortably?  5 min    How long can you walk comfortably?  10 min    Diagnostic tests  Lumbar  spine spondylosis most notable at L5-S1 with a [place] centraldisc protrusion which contacts bilateral descending S1 nerve rootswithout impingement. There is moderate bilateral neural foraminal narrowing.    Patient Stated Goals  improve pain    Currently in Pain?  Yes    Pain Score  5     Pain Location  Back    Pain Orientation  Right;Lower    Pain Descriptors / Indicators  Cramping;Spasm    Pain Type  Acute pain    Pain Radiating Towards  pain to Rt mid calf    Pain Onset  More than a month ago    Pain Frequency  Constant    Aggravating Factors   sit to stand, standing, walking, rolling in bed    Pain Relieving Factors  medication, heat, patches                       [facility] Adult PT Treatment/Exercise - 09/05/19 0941       Lumbar Exercises: Aerobic   Nustep  L9 x 6 min      Manual Therapy   Manual Therapy  Soft tissue mobilization    Manual therapy comments  skilled palpation and monitoring of soft tissue during DN    Soft tissue mobilization  Rt hamstring STM and IASTM       Trigger Point Dry Needling - 09/05/19 1118    Consent Given?  Yes    Education Handout Provided  Previously provided    Muscles Treated Lower Quadrant  Hamstring    Hamstring Response  Twitch response elicited;Palpable increased muscle length                PT Long Term Goals - 08/10/19 1251      PT LONG TERM GOAL #1   Title  independent with HEP    Status  New    Target Date  09/21/19      PT LONG TERM GOAL #2   Title  negotiate stairs reciprocially without increase in pain for improved function    Status  New    Target Date  09/21/19      PT LONG TERM GOAL #3   Title  perform lumbar ROM without increase in pain    Status  New    Target Date  09/21/19      PT LONG TERM GOAL #4   Title  report centralization of symptoms for improved function    Status  New    Target Date  09/21/19      PT LONG TERM GOAL #5   Title  demonstrate 4+/5 Rt hip strength for improved function    Status  New    Target Date  09/21/19            Plan - 09/05/19 1120    Clinical Impression Statement  Pt returns to PT after approx 3 weeks due to scheduling conflicts.  She reports pain is still present but slightly improved.  Session today focused mainly on manual therapy and DN with positive response to hamstring today.  She reports reduction in pain and symptoms following session.  No goals met as only 2nd visit.    Personal Factors and Comorbidities  Comorbidity 3+    Comorbidities  PTSD, anxiety, DVT of RUE, DM, breast cx (2018), obesity, asthma    Examination-Activity Limitations  Bathing;Transfers;Sit;Stairs;Stand;Bend;Bed Mobility;Locomotion Level    Examination-Participation Restrictions  Meal Prep;Other   occupation    Stability/Clinical Decision Making  Evolving/Moderate  complexity    Rehab Potential  Good    PT Frequency  2x / week    PT Duration  6 weeks    PT Treatment/Interventions  ADLs/Self Care Home Management;Cryotherapy;Electrical Stimulation;Traction;Moist Heat;Gait training;Stair training;Functional mobility training;Therapeutic activities;Therapeutic exercise;Patient/family education;Manual techniques;Taping;Dry needling    PT Next Visit Plan  review HEP and see how symptoms are, progress to core/hip strengthening, manual/DN PRN, consider traction?    PT Home Exercise Plan  Access Code: [email or web address]    Consulted and Agree with Plan of Care  Patient       Patient will benefit from skilled therapeutic intervention in order to improve the following deficits and impairments:  Increased fascial restricitons, Increased muscle spasms, Postural dysfunction, Pain, Hypermobility, Decreased activity tolerance, Decreased strength  Visit Diagnosis: Acute right-sided low back pain with right-sided sciatica  Muscle weakness (generalized)  Abnormal posture     Problem List Patient Active Problem List   Diagnosis Date Noted  . Type 2 diabetes mellitus with hyperglycemia, without long-term current use of insulin ([place]) 08/19/2019  . Hot flashes 08/13/2018  . Snoring 04/15/2018  . Post traumatic stress disorder (PTSD) 02/25/2018  . Anxiety   . Asymptomatic microscopic hematuria 09/29/2017  . Iron deficiency anemia 08/06/2017  . Adnexal mass, RIGHT 12/16/2016  . Menorrhagia 12/16/2016  . Genetic testing 12/15/2016  . Family history of breast cancer   . Family history of ovarian cancer   . Family history of neurofibromatosis   . Acute deep vein thrombosis (DVT) of axillary vein of right upper extremity ([place]) 11/26/2016  . Type 2 diabetes mellitus with diabetic polyneuropathy, without long-term current use of insulin ([place])   . Malignant neoplasm of upper-inner quadrant of right breast in female,  estrogen receptor negative ([place]) 10/21/2016  . Low HDL (under 40) 09/16/2016  . ASCVD (arteriosclerotic cardiovascular disease) 09/16/2016  . Light cigarette smoker 09/15/2016  . Morbid obesity ([place]) 09/15/2016  . Mild intermittent asthma 09/15/2016      Laureen Abrahams, PT, DPT 09/05/19 11:22 AM     [facility] [address] [place], [name], [address] Phone: [phone]   Fax:  [phone]  Name: Damita Eppard MRN: [id number] Date of Birth: November 03, 1975

## 2019-09-06 ENCOUNTER — Telehealth: Payer: Self-pay | Admitting: Family Medicine

## 2019-09-06 NOTE — Telephone Encounter (Signed)
Pharmacy called and requesting a Rx fort the Tramadol.It was never sent. If so please resend  Please electronically. Waiting to deliver it and need all medication at one time.

## 2019-09-06 NOTE — Telephone Encounter (Signed)
Please advise 

## 2019-09-06 NOTE — Telephone Encounter (Signed)
Sent!

## 2019-09-09 ENCOUNTER — Telehealth: Payer: Self-pay | Admitting: Family Medicine

## 2019-09-09 MED ORDER — BACLOFEN 10 MG PO TABS: 5.0000 mg | ORAL_TABLET | Freq: Three times a day (TID) | ORAL | 3 refills | Status: DC | PRN

## 2019-09-09 NOTE — Telephone Encounter (Signed)
Patient aware called into her pharmacy

## 2019-09-09 NOTE — Telephone Encounter (Signed)
Please advise 

## 2019-09-09 NOTE — Telephone Encounter (Signed)
Patient called.   She said she lost her prescription for baclofen and needs it called in again. She also asked if it could be sent to the [facility] on [address] as a one time exception because [facility]ress[facility] won't fill it same day.   Call back number: [phone]

## 2019-09-09 NOTE — Telephone Encounter (Signed)
Sent to [facility]

## 2019-09-13 ENCOUNTER — Encounter: Payer: Self-pay | Admitting: Physical Therapy

## 2019-09-13 ENCOUNTER — Ambulatory Visit (INDEPENDENT_AMBULATORY_CARE_PROVIDER_SITE_OTHER): Payer: 59 | Admitting: Physical Therapy

## 2019-09-13 ENCOUNTER — Other Ambulatory Visit: Payer: Self-pay

## 2019-09-13 DIAGNOSIS — M5441 Lumbago with sciatica, right side: Secondary | ICD-10-CM | POA: Diagnosis not present

## 2019-09-13 DIAGNOSIS — M6281 Muscle weakness (generalized): Secondary | ICD-10-CM | POA: Diagnosis not present

## 2019-09-13 DIAGNOSIS — R293 Abnormal posture: Secondary | ICD-10-CM | POA: Diagnosis not present

## 2019-09-13 NOTE — Therapy (Signed)
[facility] [address] [place], [name], [address] Phone: [phone]   Fax:  [phone]  Physical Therapy Treatment  Patient Details  Name: Kelly Mendez MRN: [id number] Date of Birth: November 10, 1975 Referring Provider (PT): Eunice Blase, MD   Encounter Date: 09/13/2019  PT End of Session - 09/13/19 1009    Visit Number  3    Number of Visits  12    Date for PT Re-Evaluation  09/21/19    PT Start Time  0930    PT Stop Time  1008    PT Time Calculation (min)  38 min    Activity Tolerance  Patient tolerated treatment well    Behavior During Therapy  [facility] for tasks assessed/performed       Past Medical History:  Diagnosis Date  . Abnormal Pap smear of cervix   . Anxiety   . Asthma   . Asymptomatic microscopic hematuria 09/29/2017  . 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 Concussion    around age 50  . Diabetes type 2, uncontrolled ([place])    new diagnosis in 08/2016  . DVT (deep venous thrombosis) ([place]) 11/24/2016   in rt arm  . Family history of breast cancer   . Family history of neurofibromatosis   . Family history of ovarian cancer   . History of radiation therapy 04/21/17- 05/20/17   Right Breast 40.05 Gy in 15 fractions followed by a 10 Gy boost in 5 fractions to yield a total dose of 50.05 Gy  . HPV in female   . Hyperlipemia   . Malignant neoplasm of upper-inner quadrant of right breast in female, estrogen receptor negative ([place]) 10/21/2016  . Neuromuscular disorder (HCC)    neuropathy  . Pelvic mass in female 06/16/2017  . Personal history of chemotherapy 2018  . Personal history of radiation therapy 2018  . Pneumonia    2014   . PTSD (post-traumatic stress disorder)     Past Surgical History:  Procedure Laterality Date  . BREAST LUMPECTOMY Right 03/18/2017  . BREAST LUMPECTOMY WITH RADIOACTIVE SEED AND SENTINEL LYMPH NODE BIOPSY Right 03/18/2017   Procedure: RIGHT BREAST  LUMPECTOMY WITH RADIOACTIVE SEED AND RIGHT SENTINEL LYMPH NODE BIOPSY;  Surgeon: [name], MD;  Location: [place];  Service: General;  Laterality: Right;  . COLPOSCOPY    . left ovary removed  1978   was told her ovary was removed but Dr. Denman George found that was not the case. Suspect this was originally an ovarian cystectomy  . PORTACATH PLACEMENT Right 10/29/2016   Procedure: INSERTION PORT-A-CATH WITH [place];  Surgeon: [name], MD;  Location: [place];  Service: General;  Laterality: Right;  . portacath removal     oct. 5 2018  . ROBOTIC ASSISTED TOTAL HYSTERECTOMY Bilateral 06/16/2017   Procedure: XI ROBOTIC ASSISTED TOTAL HYSTERECTOMY WITH BILATERAL SALPINGECTOMY,  OOPHERECTOMY;  Surgeon: [name], MD;  [facility];  Service: Gynecology;  Laterality: Bilateral;    There were no vitals filed for this visit.  Subjective Assessment - 09/13/19 0929    Subjective  felt great after leaving here last visit  "and I think I overdid it."  still having some cramping into her hamstring and calf.  took meloxicam and muscle relaxer at 8:00 AM    Limitations  Standing;Walking    How long can you stand comfortably?  5 min    How long can you walk comfortably?  10 min    Diagnostic tests  Lumbar spine spondylosis most notable at L5-S1 with a [place] centraldisc protrusion which contacts bilateral descending S1 nerve rootswithout impingement. There is moderate bilateral neural foraminal narrowing.    Patient Stated Goals  improve pain    Pain Score  4     Pain Location  Leg    Pain Orientation  Right;Upper    Pain Descriptors / Indicators  Cramping;Spasm    Pain Type  Acute pain    Pain Onset  More than a month ago    Aggravating Factors   sit to stand, standing, walking, rolling in bed    Pain Relieving Factors  medication, heat, patches                       [facility] Adult PT Treatment/Exercise - 09/13/19 0933      Lumbar Exercises: Aerobic   Nustep  L9 x  6 min      Manual Therapy   Manual therapy comments  skilled palpation and monitoring of soft tissue during DN    Soft tissue mobilization  Rt hamstring STM and IASTM       Trigger Point Dry Needling - 09/13/19 0001    Consent Given?  Yes    Education Handout Provided  Previously provided    Muscles Treated Lower Quadrant  Hamstring    Electrical Stimulation Performed with Dry Needling  Yes    E-stim with Dry Needling Details  to tolerance x 5 min    Hamstring Response  Twitch response elicited;Palpable increased muscle length                PT Long Term Goals - 08/10/19 1251      PT LONG TERM GOAL #1   Title  independent with HEP    Status  New    Target Date  09/21/19      PT LONG TERM GOAL #2   Title  negotiate stairs reciprocially without increase in pain for improved function    Status  New    Target Date  09/21/19      PT LONG TERM GOAL #3   Title  perform lumbar ROM without increase in pain    Status  New    Target Date  09/21/19      PT LONG TERM GOAL #4   Title  report centralization of symptoms for improved function    Status  New    Target Date  09/21/19      PT LONG TERM GOAL #5   Title  demonstrate 4+/5 Rt hip strength for improved function    Status  New    Target Date  09/21/19            Plan - 09/13/19 1009    Clinical Impression Statement  Pt tolerated session well today, and overall had reduction in pain after last sesion.  Will continue to benefit from PT to maximize function.    Personal Factors and Comorbidities  Comorbidity 3+    Comorbidities  PTSD, anxiety, DVT of RUE, DM, breast cx (2018), obesity, asthma    Examination-Activity Limitations  Bathing;Transfers;Sit;Stairs;Stand;Bend;Bed Mobility;Locomotion Level    Examination-Participation Restrictions  Meal Prep;Other   occupation   Stability/Clinical Decision Making  Evolving/Moderate complexity    Rehab Potential  Good    PT Frequency  2x / week    PT Duration  6 weeks     PT Treatment/Interventions  ADLs/Self Care Home  Management;Cryotherapy;Electrical Stimulation;Traction;Moist Heat;Gait training;Stair training;Functional mobility training;Therapeutic activities;Therapeutic exercise;Patient/family education;Manual techniques;Taping;Dry needling    PT Next Visit Plan  review HEP and see how symptoms are, progress to core/hip strengthening, manual/DN PRN, consider traction?    PT Home Exercise Plan  Access Code: [email or web address]    Consulted and Agree with Plan of Care  Patient       Patient will benefit from skilled therapeutic intervention in order to improve the following deficits and impairments:  Increased fascial restricitons, Increased muscle spasms, Postural dysfunction, Pain, Hypermobility, Decreased activity tolerance, Decreased strength  Visit Diagnosis: Acute right-sided low back pain with right-sided sciatica  Muscle weakness (generalized)  Abnormal posture     Problem List Patient Active Problem List   Diagnosis Date Noted  . Type 2 diabetes mellitus with hyperglycemia, without long-term current use of insulin ([place]) 08/19/2019  . Hot flashes 08/13/2018  . Snoring 04/15/2018  . Post traumatic stress disorder (PTSD) 02/25/2018  . Anxiety   . Asymptomatic microscopic hematuria 09/29/2017  . Iron deficiency anemia 08/06/2017  . Adnexal mass, RIGHT 12/16/2016  . Menorrhagia 12/16/2016  . Genetic testing 12/15/2016  . Family history of breast cancer   . Family history of ovarian cancer   . Family history of neurofibromatosis   . Acute deep vein thrombosis (DVT) of axillary vein of right upper extremity ([place]) 11/26/2016  . Type 2 diabetes mellitus with diabetic polyneuropathy, without long-term current use of insulin ([place])   . 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 Low HDL (under 40) 09/16/2016  . ASCVD (arteriosclerotic cardiovascular disease) 09/16/2016  . Light  cigarette smoker 09/15/2016  . Morbid obesity ([place]) 09/15/2016  . Mild intermittent asthma 09/15/2016      Laureen Abrahams, PT, DPT 09/13/19 10:11 AM    [facility] [address] [place], [name], [address] Phone: [phone]   Fax:  [phone]  Name: Nkiruka Samaroo MRN: [id number] Date of Birth: 28-Aug-1975

## 2019-09-15 ENCOUNTER — Other Ambulatory Visit: Payer: Self-pay | Admitting: Family Medicine

## 2019-09-15 ENCOUNTER — Telehealth: Payer: Self-pay

## 2019-09-15 NOTE — Telephone Encounter (Signed)
[facility] called triage phone with refill request on behalf of patient for Ultram 50mg .  Please advise. [phone]

## 2019-09-16 ENCOUNTER — Encounter: Payer: Self-pay | Admitting: Rehabilitative and Restorative Service Providers"

## 2019-09-16 ENCOUNTER — Other Ambulatory Visit: Payer: Self-pay

## 2019-09-16 ENCOUNTER — Ambulatory Visit (INDEPENDENT_AMBULATORY_CARE_PROVIDER_SITE_OTHER): Payer: 59 | Admitting: Rehabilitative and Restorative Service Providers"

## 2019-09-16 DIAGNOSIS — R293 Abnormal posture: Secondary | ICD-10-CM | POA: Diagnosis not present

## 2019-09-16 DIAGNOSIS — M5441 Lumbago with sciatica, right side: Secondary | ICD-10-CM | POA: Diagnosis not present

## 2019-09-16 DIAGNOSIS — M6281 Muscle weakness (generalized): Secondary | ICD-10-CM | POA: Diagnosis not present

## 2019-09-16 NOTE — Therapy (Signed)
[facility] Physical Therapy [address] [place], [name], [address] Phone: [phone]   Fax:  [phone]  Physical Therapy Treatment  Patient Details  Name: Kelly Mendez MRN: [id number] Date of Birth: 1975-11-07 Referring Provider (PT): Eunice Blase, MD   Encounter Date: 09/16/2019  PT End of Session - 09/16/19 1043    Visit Number  4    Number of Visits  12    Date for PT Re-Evaluation  09/21/19    PT Start Time  1018    PT Stop Time  1100    PT Time Calculation (min)  42 min    Activity Tolerance  Patient tolerated treatment well    Behavior During Therapy  [facility] for tasks assessed/performed       Past Medical History:  Diagnosis Date  . Abnormal Pap smear of cervix   . Anxiety   . Asthma   . Asymptomatic microscopic hematuria 09/29/2017  . 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 Concussion    around age 78  . Diabetes type 2, uncontrolled ([place])    new diagnosis in 08/2016  . DVT (deep venous thrombosis) ([place]) 11/24/2016   in rt arm  . Family history of breast cancer   . Family history of neurofibromatosis   . Family history of ovarian cancer   . History of radiation therapy 04/21/17- 05/20/17   Right Breast 40.05 Gy in 15 fractions followed by a 10 Gy boost in 5 fractions to yield a total dose of 50.05 Gy  . HPV in female   . Hyperlipemia   . Malignant neoplasm of upper-inner quadrant of right breast in female, estrogen receptor negative ([place]) 10/21/2016  . Neuromuscular disorder (HCC)    neuropathy  . Pelvic mass in female 06/16/2017  . Personal history of chemotherapy 2018  . Personal history of radiation therapy 2018  . Pneumonia    2014   . PTSD (post-traumatic stress disorder)     Past Surgical History:  Procedure Laterality Date  . BREAST LUMPECTOMY Right 03/18/2017  . BREAST LUMPECTOMY WITH RADIOACTIVE SEED AND SENTINEL LYMPH NODE BIOPSY Right 03/18/2017   Procedure: RIGHT BREAST  LUMPECTOMY WITH RADIOACTIVE SEED AND RIGHT SENTINEL LYMPH NODE BIOPSY;  Surgeon: [name], MD;  Location: [place];  Service: General;  Laterality: Right;  . COLPOSCOPY    . left ovary removed  1978   was told her ovary was removed but Dr. Denman George found that was not the case. Suspect this was originally an ovarian cystectomy  . PORTACATH PLACEMENT Right 10/29/2016   Procedure: INSERTION PORT-A-CATH WITH [place];  Surgeon: [name], MD;  Location: [place];  Service: General;  Laterality: Right;  . portacath removal     oct. 5 2018  . ROBOTIC ASSISTED TOTAL HYSTERECTOMY Bilateral 06/16/2017   Procedure: XI ROBOTIC ASSISTED TOTAL HYSTERECTOMY WITH BILATERAL SALPINGECTOMY,  OOPHERECTOMY;  Surgeon: [name], MD;  [facility];  Service: Gynecology;  Laterality: Bilateral;    There were no vitals filed for this visit.  Subjective Assessment - 09/16/19 1021    Subjective  Pt. indicated feeling pain about 3/10 or so, feeling less than before.    Limitations  Standing;Walking    How long can you stand comfortably?  5 min    How long can you walk comfortably?  10 min    Diagnostic tests  Lumbar spine spondylosis most notable at L5-S1 with a [employer] centraldisc protrusion  which contacts bilateral descending S1 nerve rootswithout impingement. There is moderate bilateral neural foraminal narrowing.    Patient Stated Goals  improve pain    Pain Score  3     Pain Location  Leg    Pain Orientation  Right;Upper    Pain Onset  More than a month ago                       [facility] Adult PT Treatment/Exercise - 09/16/19 0001      Exercises   Exercises  Knee/Hip      Lumbar Exercises: Aerobic   Recumbent Bike  L2 10 mins      Knee/Hip Exercises: Stretches   Passive Hamstring Stretch  Right;3 reps;30 seconds    Gastroc Stretch  Both;5 reps;30 seconds   incline stretch     Knee/Hip Exercises: Standing   Other Standing Knee Exercises  DL RDL to knee 3 x 10       Manual Therapy   Manual therapy comments  skilled palpation and monitoring of soft tissue during DN    Soft tissue mobilization  Rt hamstring STM       Trigger Point Dry Needling - 09/16/19 0001    Consent Given?  Yes    Education Handout Provided  Previously provided    Muscles Treated Lower Quadrant  Hamstring    Hamstring Response  Twitch response elicited           PT Education - 09/16/19 1035    Education Details  Cues for intervention at times    Person(s) Educated  Patient    Methods  Explanation;Demonstration    Comprehension  Verbalized understanding;Returned demonstration          PT Long Term Goals - 08/10/19 1251      PT LONG TERM GOAL #1   Title  independent with HEP    Status  New    Target Date  09/21/19      PT LONG TERM GOAL #2   Title  negotiate stairs reciprocially without increase in pain for improved function    Status  New    Target Date  09/21/19      PT LONG TERM GOAL #3   Title  perform lumbar ROM without increase in pain    Status  New    Target Date  09/21/19      PT LONG TERM GOAL #4   Title  report centralization of symptoms for improved function    Status  New    Target Date  09/21/19      PT LONG TERM GOAL #5   Title  demonstrate 4+/5 Rt hip strength for improved function    Status  New    Target Date  09/21/19            Plan - 09/16/19 1035    Clinical Impression Statement  Pt. demonstrated signs of improvement from hamstring mobility and pain symptoms to this point, particularly c DN.  Additional hamstring strengthening, mobility provided in exercise intervention and HEP.    Personal Factors and Comorbidities  Comorbidity 3+    Comorbidities  PTSD, anxiety, DVT of RUE, DM, breast cx (2018), obesity, asthma    Examination-Activity Limitations  Bathing;Transfers;Sit;Stairs;Stand;Bend;Bed Mobility;Locomotion Level    Examination-Participation Restrictions  Meal Prep;Other   occupation   Stability/Clinical Decision  Making  Evolving/Moderate complexity    Rehab Potential  Good    PT Frequency  2x / week  PT Duration  6 weeks    PT Treatment/Interventions  ADLs/Self Care Home Management;Cryotherapy;Electrical Stimulation;Traction;Moist Heat;Gait training;Stair training;Functional mobility training;Therapeutic activities;Therapeutic exercise;Patient/family education;Manual techniques;Taping;Dry needling    PT Next Visit Plan  Continue DN prn, improve hamstring strength and mobility.    PT Home Exercise Plan  Access Code: [email or web address]    Consulted and Agree with Plan of Care  Patient       Patient will benefit from skilled therapeutic intervention in order to improve the following deficits and impairments:  Increased fascial restricitons, Increased muscle spasms, Postural dysfunction, Pain, Hypermobility, Decreased activity tolerance, Decreased strength  Visit Diagnosis: Acute right-sided low back pain with right-sided sciatica  Muscle weakness (generalized)  Abnormal posture     Problem List Patient Active Problem List   Diagnosis Date Noted  . Type 2 diabetes mellitus with hyperglycemia, without long-term current use of insulin ([place]) 08/19/2019  . Hot flashes 08/13/2018  . Snoring 04/15/2018  . Post traumatic stress disorder (PTSD) 02/25/2018  . Anxiety   . Asymptomatic microscopic hematuria 09/29/2017  . Iron deficiency anemia 08/06/2017  . Adnexal mass, RIGHT 12/16/2016  . Menorrhagia 12/16/2016  . Genetic testing 12/15/2016  . Family history of breast cancer   . Family history of ovarian cancer   . Family history of neurofibromatosis   . Acute deep vein thrombosis (DVT) of axillary vein of right upper extremity ([place]) 11/26/2016  . Type 2 diabetes mellitus with diabetic polyneuropathy, without long-term current use of insulin ([place])   . Malignant neoplasm of upper-inner quadrant of right breast in female, estrogen receptor negative ([place]) 10/21/2016  . Low HDL (under 40) 09/16/2016  .  ASCVD (arteriosclerotic cardiovascular disease) 09/16/2016  . Light cigarette smoker 09/15/2016  . Morbid obesity ([place]) 09/15/2016  . Mild intermittent asthma 09/15/2016    Scot Jun, PT, DPT, OCS, ATC 09/16/19  11:02 AM     [facility] Physical Therapy [address] [place], [name], [address] Phone: [phone]   Fax:  [phone]  Name: Joumana Beichner MRN: [id number] Date of Birth: 08/08/1975

## 2019-09-16 NOTE — Telephone Encounter (Signed)
Please advise 

## 2019-09-16 NOTE — Telephone Encounter (Signed)
Rx sent 

## 2019-09-23 ENCOUNTER — Encounter: Payer: Self-pay | Admitting: Physical Therapy

## 2019-09-23 ENCOUNTER — Other Ambulatory Visit: Payer: Self-pay

## 2019-09-23 ENCOUNTER — Ambulatory Visit (INDEPENDENT_AMBULATORY_CARE_PROVIDER_SITE_OTHER): Payer: 59 | Admitting: Physical Therapy

## 2019-09-23 DIAGNOSIS — M5441 Lumbago with sciatica, right side: Secondary | ICD-10-CM

## 2019-09-23 DIAGNOSIS — M6281 Muscle weakness (generalized): Secondary | ICD-10-CM | POA: Diagnosis not present

## 2019-09-23 DIAGNOSIS — R293 Abnormal posture: Secondary | ICD-10-CM | POA: Diagnosis not present

## 2019-09-23 NOTE — Therapy (Addendum)
[facility] Physical Therapy [address] [place], [name], [address] Phone: [phone]   Fax:  [phone]  Physical Therapy Treatment/Discharge Summary  Patient Details  Name: Sary Bogie MRN: [id number] Date of Birth: [birth year] Referring Provider (PT): Eunice Blase, MD   Encounter Date: 09/23/2019  PT End of Session - 09/23/19 0917    Visit Number  5    Number of Visits  12    Date for PT Re-Evaluation  10/12/19   extended due to missed weeks   PT Start Time  0845    PT Stop Time  0930    PT Time Calculation (min)  45 min    Activity Tolerance  Patient tolerated treatment well    Behavior During Therapy  [facility] for tasks assessed/performed       Past Medical History:  Diagnosis Date  . Abnormal Pap smear of cervix   . Anxiety   . Asthma   . Asymptomatic microscopic hematuria 09/29/2017  . 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 Concussion    around age 44  . Diabetes type 2, uncontrolled ([place])    new diagnosis in 08/2016  . DVT (deep venous thrombosis) ([place]) 11/24/2016   in rt arm  . Family history of breast cancer   . Family history of neurofibromatosis   . Family history of ovarian cancer   . History of radiation therapy 04/21/17- 05/20/17   Right Breast 40.05 Gy in 15 fractions followed by a 10 Gy boost in 5 fractions to yield a total dose of 50.05 Gy  . HPV in female   . Hyperlipemia   . Malignant neoplasm of upper-inner quadrant of right breast in female, estrogen receptor negative ([place]) 10/21/2016  . Neuromuscular disorder (HCC)    neuropathy  . Pelvic mass in female 06/16/2017  . Personal history of chemotherapy 2018  . Personal history of radiation therapy 2018  . Pneumonia    2014   . PTSD (post-traumatic stress disorder)     Past Surgical History:  Procedure Laterality Date  . BREAST LUMPECTOMY Right 03/18/2017  . BREAST LUMPECTOMY WITH RADIOACTIVE SEED AND SENTINEL LYMPH NODE  BIOPSY Right 03/18/2017   Procedure: RIGHT BREAST LUMPECTOMY WITH RADIOACTIVE SEED AND RIGHT SENTINEL LYMPH NODE BIOPSY;  Surgeon: [name], MD;  Location: [place];  Service: General;  Laterality: Right;  . COLPOSCOPY    . left ovary removed  1978   was told her ovary was removed but Dr. Denman George found that was not the case. Suspect this was originally an ovarian cystectomy  . PORTACATH PLACEMENT Right 10/29/2016   Procedure: INSERTION PORT-A-CATH WITH [place];  Surgeon: [name], MD;  Location: [place];  Service: General;  Laterality: Right;  . portacath removal     oct. 5 2018  . ROBOTIC ASSISTED TOTAL HYSTERECTOMY Bilateral 06/16/2017   Procedure: XI ROBOTIC ASSISTED TOTAL HYSTERECTOMY WITH BILATERAL SALPINGECTOMY,  OOPHERECTOMY;  Surgeon: [name], MD;  [facility];  Service: Gynecology;  Laterality: Bilateral;    There were no vitals filed for this visit.  Subjective Assessment - 09/23/19 0849    Subjective  doing okay, pain is around a 3/10.  has some pain deep in the Rt hip.  feels DN helps for a few days, exercises aren't doing anything    Limitations  Standing;Walking    How long can you stand comfortably?  5 min    How long can you  walk comfortably?  10 min    Diagnostic tests  Lumbar spine spondylosis most notable at L5-S1 with a [place] centraldisc protrusion which contacts bilateral descending S1 nerve rootswithout impingement. There is moderate bilateral neural foraminal narrowing.    Patient Stated Goals  improve pain    Currently in Pain?  Yes    Pain Score  3     Pain Location  Leg    Pain Orientation  Right    Pain Descriptors / Indicators  Spasm;Cramping    Pain Type  Acute pain    Pain Onset  More than a month ago    Pain Frequency  Constant    Aggravating Factors   sit to stand, walking, rolling in bed    Pain Relieving Factors  medication, heat, patches                       [facility] Adult PT Treatment/Exercise - 09/23/19  0852      Lumbar Exercises: Stretches   Standing Extension  10 reps;10 seconds   increased time between reps   Other Lumbar Stretch Exercise  Rt lateral shift correction 10x10 sec      Lumbar Exercises: Aerobic   Nustep  L7 x 8 min      Modalities   Modalities  Traction      Traction   Type of Traction  Lumbar    Min (lbs)  110    Max (lbs)  120    Hold Time  60    Rest Time  20    Time  15             PT Education - 09/23/19 0916    Education Details  traction and rationle    Person(s) Educated  Patient    Methods  Explanation    Comprehension  Verbalized understanding          PT Long Term Goals - 09/23/19 0918      PT LONG TERM GOAL #1   Title  independent with HEP    Status  New    Target Date  10/12/19      PT LONG TERM GOAL #2   Title  negotiate stairs reciprocially without increase in pain for improved function    Status  New    Target Date  10/12/19      PT LONG TERM GOAL #3   Title  perform lumbar ROM without increase in pain    Status  New    Target Date  10/12/19      PT LONG TERM GOAL #4   Title  report centralization of symptoms for improved function    Status  New    Target Date  10/12/19      PT LONG TERM GOAL #5   Title  demonstrate 4+/5 Rt hip strength for improved function    Status  New    Target Date  10/12/19            Plan - 09/23/19 0918    Clinical Impression Statement  Pt seen today with reports of short term benefit without long term improvements.  She feels exercises haven't changed symptoms and DN is only providing minimal short term relief.  Trial of traction today to see if this is beneficial, and discussed possible follow up with MD if no added benefit.  Pt verbalized understanding.    Personal Factors and Comorbidities  Comorbidity 3+    Comorbidities  PTSD,  anxiety, DVT of RUE, DM, breast cx (2018), obesity, asthma    Examination-Activity Limitations  Bathing;Transfers;Sit;Stairs;Stand;Bend;Bed  Mobility;Locomotion Level    Examination-Participation Restrictions  Meal Prep;Other   occupation   Stability/Clinical Decision Making  Evolving/Moderate complexity    Rehab Potential  Good    PT Frequency  2x / week    PT Duration  6 weeks    PT Treatment/Interventions  ADLs/Self Care Home Management;Cryotherapy;Electrical Stimulation;Traction;Moist Heat;Gait training;Stair training;Functional mobility training;Therapeutic activities;Therapeutic exercise;Patient/family education;Manual techniques;Taping;Dry needling    PT Next Visit Plan  Continue DN prn, improve hamstring strength and mobility.  Assess response to traction.    PT Home Exercise Plan  Access Code: [email or web address]    Consulted and Agree with Plan of Care  Patient       Patient will benefit from skilled therapeutic intervention in order to improve the following deficits and impairments:  Increased fascial restricitons, Increased muscle spasms, Postural dysfunction, Pain, Hypermobility, Decreased activity tolerance, Decreased strength  Visit Diagnosis: Acute right-sided low back pain with right-sided sciatica  Muscle weakness (generalized)  Abnormal posture     Problem List Patient Active Problem List   Diagnosis Date Noted  . Type 2 diabetes mellitus with hyperglycemia, without long-term current use of insulin ([place]) 08/19/2019  . Hot flashes 08/13/2018  . Snoring 04/15/2018  . Post traumatic stress disorder (PTSD) 02/25/2018  . Anxiety   . Asymptomatic microscopic hematuria 09/29/2017  . Iron deficiency anemia 08/06/2017  . Adnexal mass, RIGHT 12/16/2016  . Menorrhagia 12/16/2016  . Genetic testing 12/15/2016  . Family history of breast cancer   . Family history of ovarian cancer   . Family history of neurofibromatosis   . Acute deep vein thrombosis (DVT) of axillary vein of right upper extremity ([place]) 11/26/2016  . Type 2 diabetes mellitus with diabetic polyneuropathy, without long-term current use of insulin ([place])    . Malignant neoplasm of upper-inner quadrant of right breast in female, estrogen receptor negative ([place]) 10/21/2016  . Low HDL (under 40) 09/16/2016  . ASCVD (arteriosclerotic cardiovascular disease) 09/16/2016  . Light cigarette smoker 09/15/2016  . Morbid obesity ([place]) 09/15/2016  . Mild intermittent asthma 09/15/2016      Laureen Abrahams, PT, DPT 09/23/19 9:20 AM    [facility] Physical Therapy [address] [place], [name], [address] Phone: [phone]   Fax:  [phone]  Name: Arnesha Schiraldi MRN: [id number] Date of Birth: [birth year]     PHYSICAL THERAPY DISCHARGE SUMMARY  Visits from Start of Care: 5  Current functional level related to goals / functional outcomes: See above   Remaining deficits: unknown   Education / Equipment: HEP, DN  Plan: Patient agrees to discharge.  Patient goals were not met. Patient is being discharged due to not returning since the last visit.  ?????    Laureen Abrahams, PT, DPT 12/13/19 10:00 AM  [facility] Physical Therapy [address] [place], [name], [address] Phone: [phone]   Fax:  [phone]

## 2019-09-26 ENCOUNTER — Ambulatory Visit (INDEPENDENT_AMBULATORY_CARE_PROVIDER_SITE_OTHER): Payer: 59 | Admitting: Family Medicine

## 2019-09-26 ENCOUNTER — Encounter: Payer: Self-pay | Admitting: Family Medicine

## 2019-09-26 ENCOUNTER — Other Ambulatory Visit: Payer: Self-pay

## 2019-09-26 DIAGNOSIS — M5441 Lumbago with sciatica, right side: Secondary | ICD-10-CM

## 2019-09-26 MED ORDER — DICLOFENAC SODIUM 75 MG PO TBEC: 75.0000 mg | DELAYED_RELEASE_TABLET | Freq: Two times a day (BID) | ORAL | 3 refills | Status: DC | PRN

## 2019-09-26 MED ORDER — TRAMADOL HCL 50 MG PO TABS: 50.0000 mg | ORAL_TABLET | Freq: Three times a day (TID) | ORAL | 0 refills | Status: DC | PRN

## 2019-09-26 NOTE — Progress Notes (Signed)
Office Visit Note   Patient: Kelly Mendez           Date of Birth: 08/15/1975           MRN: [id number] Visit Date: 09/26/2019 Requested by: [name], NP-C [place],  [place] [address] PCP: [name], NP-C  Subjective: Chief Complaint  Patient presents with  . Lower Back - Follow-up    HPI: She is here for follow-up low back pain with disc bulges L3-4, L4 4 5 and L5-S1.  At L5-S1 on MRI scan the disc was making contact with the S1 nerve roots.  She has been through multiple sessions of physical therapy.  We had initially requested epidural injection but she never got a call for that, she presumed that insurance did not cover it.  She is not making much progress with physical therapy and would like to know if an injection would be an option for her.  She is having to take tramadol 3 times daily.              ROS: No bowel or bladder dysfunction.  All other systems were reviewed and are negative.  Objective: Vital Signs: LMP 01/10/2017   Physical Exam:  General:  Alert and oriented, in no acute distress. Pulm:  Breathing unlabored. Psy:  Normal mood, congruent affect.  Low back: She has tenderness in the right ischial tuberosity area.  She has negative straight leg raise.  She has weakness with ankle eversion on the right, 4+/5.  All other strength testing was normal and DTRs are 2+ and symmetric.  Imaging: None today  Assessment & Plan: 1.  Persistent low back pain with right-sided sciatica, slight weakness with ankle eversion. -Refilled tramadol.  Referral for epidural injection.  If symptoms persist, possible surgical consult.     Procedures: No procedures performed  No notes on file     PMFS History: Patient Active Problem List   Diagnosis Date Noted  . Type 2 diabetes mellitus with hyperglycemia, without long-term current use of insulin ([place]) 08/19/2019  . Hot flashes 08/13/2018  . Snoring 04/15/2018  . Post  traumatic stress disorder (PTSD) 02/25/2018  . Anxiety   . Asymptomatic microscopic hematuria 09/29/2017  . Iron deficiency anemia 08/06/2017  . Adnexal mass, RIGHT 12/16/2016  . Menorrhagia 12/16/2016  . Genetic testing 12/15/2016  . Family history of breast cancer   . Family history of ovarian cancer   . Family history of neurofibromatosis   . Acute deep vein thrombosis (DVT) of axillary vein of right upper extremity ([place]) 11/26/2016  . Type 2 diabetes mellitus with diabetic polyneuropathy, without long-term current use of insulin ([place])   . Malignant neoplasm of upper-inner quadrant of right breast in female, estrogen receptor negative ([place]) 10/21/2016  . Low HDL (under 40) 09/16/2016  . ASCVD (arteriosclerotic cardiovascular disease) 09/16/2016  . Light cigarette smoker 09/15/2016  . Morbid obesity ([place]) 09/15/2016  . Mild intermittent asthma 09/15/2016   Past Medical History:  Diagnosis Date  . Abnormal Pap smear of cervix   . Anxiety   . Asthma   . Asymptomatic microscopic hematuria 09/29/2017  . 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 Concussion    around age 25  . Diabetes type 2, uncontrolled ([place])    new diagnosis in 08/2016  . DVT (deep venous thrombosis) ([place]) 11/24/2016   in rt arm  . Family history of breast  cancer   . Family history of neurofibromatosis   . Family history of ovarian cancer   . History of radiation therapy 04/21/17- 05/20/17   Right Breast 40.05 Gy in 15 fractions followed by a 10 Gy boost in 5 fractions to yield a total dose of 50.05 Gy  . HPV in female   . Hyperlipemia   . Malignant neoplasm of upper-inner quadrant of right breast in female, estrogen receptor negative ([place]) 10/21/2016  . Neuromuscular disorder (HCC)    neuropathy  . Pelvic mass in female 06/16/2017  . Personal history of chemotherapy 2018  . Personal history of radiation therapy 2018  . Pneumonia    2014   . PTSD (post-traumatic stress disorder)       Family History  Problem Relation Age of Onset  . Breast cancer Mother 98  . Hepatitis C Father   . Ovarian cancer Maternal Aunt        dx in her 71s  . Neurofibromatosis Maternal Uncle   . Brain cancer Maternal Uncle 58  . Lung cancer Maternal Grandfather   . Kidney failure Paternal Grandmother   . Heart attack Paternal Grandfather   . Ovarian cancer Maternal Aunt        dx in her 69s  . Neurofibromatosis Maternal Aunt   . Ovarian cancer Maternal Aunt   . Neurofibromatosis Maternal Aunt   . Cervical cancer Maternal Aunt   . Neurofibromatosis Maternal Aunt   . Cancer Maternal Aunt        cancer on the bottom of her foot  . Breast cancer Other        MGF's sisters  . Neurofibromatosis Other        MGM's paternal aunt    Past Surgical History:  Procedure Laterality Date  . BREAST LUMPECTOMY Right 03/18/2017  . BREAST LUMPECTOMY WITH RADIOACTIVE SEED AND SENTINEL LYMPH NODE BIOPSY Right 03/18/2017   Procedure: RIGHT BREAST LUMPECTOMY WITH RADIOACTIVE SEED AND RIGHT SENTINEL LYMPH NODE BIOPSY;  Surgeon: [name], MD;  Location: [place];  Service: General;  Laterality: Right;  . COLPOSCOPY    . left ovary removed  1978   was told her ovary was removed but Dr. Denman George found that was not the case. Suspect this was originally an ovarian cystectomy  . PORTACATH PLACEMENT Right 10/29/2016   Procedure: INSERTION PORT-A-CATH WITH [place];  Surgeon: [name], MD;  Location: [place];  Service: General;  Laterality: Right;  . portacath removal     oct. 5 2018  . ROBOTIC ASSISTED TOTAL HYSTERECTOMY Bilateral 06/16/2017   Procedure: XI ROBOTIC ASSISTED TOTAL HYSTERECTOMY WITH BILATERAL SALPINGECTOMY,  OOPHERECTOMY;  Surgeon: [name], MD;  [facility];  Service: Gynecology;  Laterality: Bilateral;   Social History   Occupational History  . Not on file  Tobacco Use  . Smoking status: Former Smoker    Packs/day: 0.15    Years: 25.00    Pack years: 3.75     Types: Cigarettes    Quit date: 11/25/2016    Years since quitting: 2.8  . Smokeless tobacco: Never Used  Substance and Sexual Activity  . Alcohol use: Yes    Alcohol/week: 1.0 standard drinks    Types: 1 Glasses of wine per week    Comment: rarely   . Drug use: No  . Sexual activity: Not Currently    Birth control/protection: Surgical    Comment: husband vasectomy

## 2019-09-27 ENCOUNTER — Telehealth: Payer: Self-pay | Admitting: *Deleted

## 2019-09-27 ENCOUNTER — Encounter: Payer: 59 | Admitting: Physical Therapy

## 2019-09-27 NOTE — Telephone Encounter (Signed)
Spoke with Atlee Abide and resubmitted for prior authorization. Case is pending. Called pt and advised.

## 2019-09-29 ENCOUNTER — Encounter: Payer: Self-pay | Admitting: Family Medicine

## 2019-09-30 ENCOUNTER — Encounter: Payer: 59 | Admitting: Physical Therapy

## 2019-10-04 ENCOUNTER — Encounter: Payer: 59 | Admitting: Physical Therapy

## 2019-10-05 ENCOUNTER — Other Ambulatory Visit: Payer: Self-pay | Admitting: Family Medicine

## 2019-10-06 ENCOUNTER — Encounter: Payer: 59 | Admitting: Physical Therapy

## 2019-10-18 ENCOUNTER — Other Ambulatory Visit: Payer: Self-pay | Admitting: Family Medicine

## 2019-10-23 NOTE — Patient Instructions (Addendum)
Remember to call and schedule your mammogram and bone density.   Keep an eye on your blood pressure at home and let me know if your readings are consistently higher than 130/80.   Continue with a healthy diet and try to get at least 150 minutes of exercise each week.   Call and schedule a diabetic eye exam.   Follow up in 6 months or sooner if needed.     Preventive Care 9-44 Years Old, Female Preventive care refers to visits with your health care provider and lifestyle choices that can promote health and wellness. This includes:  A yearly physical exam. This may also be called an annual well check.  Regular dental visits and eye exams.  Immunizations.  Screening for certain conditions.  Healthy lifestyle choices, such as eating a healthy diet, getting regular exercise, not using drugs or products that contain nicotine and tobacco, and limiting alcohol use. What can I expect for my preventive care visit? Physical exam Your health care provider will check your:  Height and weight. This may be used to calculate body mass index (BMI), which tells if you are at a healthy weight.  Heart rate and blood pressure.  Skin for abnormal spots. Counseling Your health care provider may ask you questions about your:  Alcohol, tobacco, and drug use.  Emotional well-being.  Home and relationship well-being.  Sexual activity.  Eating habits.  Work and work Statistician.  Method of birth control.  Menstrual cycle.  Pregnancy history. What immunizations do I need?  Influenza (flu) vaccine  This is recommended every year. Tetanus, diphtheria, and pertussis (Tdap) vaccine  You may need a Td booster every 10 years. Varicella (chickenpox) vaccine  You may need this if you have not been vaccinated. Zoster (shingles) vaccine  You may need this after age 58. Measles, mumps, and rubella (MMR) vaccine  You may need at least one dose of MMR if you were born in 1957 or later.  You may also need a second dose. Pneumococcal conjugate (PCV13) vaccine  You may need this if you have certain conditions and were not previously vaccinated. Pneumococcal polysaccharide (PPSV23) vaccine  You may need one or two doses if you smoke cigarettes or if you have certain conditions. Meningococcal conjugate (MenACWY) vaccine  You may need this if you have certain conditions. Hepatitis A vaccine  You may need this if you have certain conditions or if you travel or work in places where you may be exposed to hepatitis A. Hepatitis B vaccine  You may need this if you have certain conditions or if you travel or work in places where you may be exposed to hepatitis B. Haemophilus influenzae type b (Hib) vaccine  You may need this if you have certain conditions. Human papillomavirus (HPV) vaccine  If recommended by your health care provider, you may need three doses over 6 months. You may receive vaccines as individual doses or as more than one vaccine together in one shot (combination vaccines). Talk with your health care provider about the risks and benefits of combination vaccines. What tests do I need? Blood tests  Lipid and cholesterol levels. These may be checked every 5 years, or more frequently if you are over 69 years old.  Hepatitis C test.  Hepatitis B test. Screening  Lung cancer screening. You may have this screening every year starting at age 11 if you have a 30-pack-year history of smoking and currently smoke or have quit within the past 15 years.  Colorectal cancer screening. All adults should have this screening starting at age 46 and continuing until age 71. Your health care provider may recommend screening at age 65 if you are at increased risk. You will have tests every 1-10 years, depending on your results and the type of screening test.  Diabetes screening. This is done by checking your blood sugar (glucose) after you have not eaten for a while (fasting).  You may have this done every 1-3 years.  Mammogram. This may be done every 1-2 years. Talk with your health care provider about when you should start having regular mammograms. This may depend on whether you have a family history of breast cancer.  BRCA-related cancer screening. This may be done if you have a family history of breast, ovarian, tubal, or peritoneal cancers.  Pelvic exam and Pap test. This may be done every 3 years starting at age 42. Starting at age 57, this may be done every 5 years if you have a Pap test in combination with an HPV test. Other tests  Sexually transmitted disease (STD) testing.  Bone density scan. This is done to screen for osteoporosis. You may have this scan if you are at high risk for osteoporosis. Follow these instructions at home: Eating and drinking  Eat a diet that includes fresh fruits and vegetables, whole grains, lean protein, and low-fat dairy.  Take vitamin and mineral supplements as recommended by your health care provider.  Do not drink alcohol if: ? Your health care provider tells you not to drink. ? You are pregnant, may be pregnant, or are planning to become pregnant.  If you drink alcohol: ? Limit how much you have to 0-1 drink a day. ? Be aware of how much alcohol is in your drink. In the [place] one drink equals one 12 oz bottle of beer (355 mL), one 5 oz glass of wine (148 mL), or one 1 oz glass of hard liquor (44 mL). Lifestyle  Take daily care of your teeth and gums.  Stay active. Exercise for at least 30 minutes on 5 or more days each week.  Do not use any products that contain nicotine or tobacco, such as cigarettes, e-cigarettes, and chewing tobacco. If you need help quitting, ask your health care provider.  If you are sexually active, practice safe sex. Use a condom or other form of birth control (contraception) in order to prevent pregnancy and STIs (sexually transmitted infections).  If told by your health care provider,  take low-dose aspirin daily starting at age 103. What's next?  Visit your health care provider once a year for a well check visit.  Ask your health care provider how often you should have your eyes and teeth checked.  Stay up to date on all vaccines. This information is not intended to replace advice given to you by your health care provider. Make sure you discuss any questions you have with your health care provider. Document Revised: 03/11/2018 Document Reviewed: 03/11/2018 Elsevier Patient Education  2020 [employer].

## 2019-10-23 NOTE — Progress Notes (Signed)
Subjective:    Patient ID: Kelly Mendez, female    DOB: 14-Aug-1975, 44 y.o.   MRN: [id number]  HPI Chief Complaint  Patient presents with  . fasting cpe    fasting cpe, doesn't have a obgyn. had hysterectomy dec 2018.    She is here for a complete physical exam and to follow up on chronic health conditions.  Last CPE: 09/2017  Other providers: Endocrinologist- Dr. Kelton Pillar Oncologist- Dr. Jana Hakim Gyn-Onc- Dr. [name]- cleared her 2019 Orthopedist- Dr. Junius Roads and Dr. Ernestina Patches  Pulmonologist- Dr. Lamonte Sakai   Diabetes- managed by Dr. Verta Ellen Freestyle. BS now is 118  Asthma- states she has not needed her albuterol inhaler in over a year.   HL and ASCVD- taking atorvastatin daily and no issues   Hot flashes and mood- taking Effexor- XR 150 mg States this medication also helps with diffuse pain and plans to continue on it.    Reports having night sweats  Does not sleep well and thinks this may be causing fatigue.   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 Sees Dr. Junius Roads and Dr. Ernestina Patches   Morbid obesity- has lost approximately 15 lbs   No longer smoking.  Drinks alcohol occasionally.   Sees Dr. 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  Denies smoking or drug use  Drinks alcohol occasionally.  Diet: since last week of January she has been doing Weight Watchers Excerise: nothing regular   Immunizations: UTD  Health maintenance:  Mammogram: 09/2018  Colonoscopy: never  Last Gynecological Exam: 2019 Last Menstrual cycle: total hysterectomy  DEXA: never. Oncologist ordered one Last Dental Exam: years  Last Eye Exam: years   Wears seatbelt always, smoke detectors in home and functioning, does not text while driving and feels safe in home environment.   Reviewed allergies, medications, past medical, surgical, family, and social history.    Review of Systems Review of  Systems Constitutional: -fever, -chills, +sweats, -unexpected weight change,-fatigue ENT: -runny nose, -ear pain, -sore throat Cardiology:  -chest pain, -palpitations, -edema Respiratory: -cough, -shortness of breath, -wheezing Gastroenterology: -abdominal pain, -nausea, -vomiting, + intermittent diarrhea, -constipation  Hematology: -bleeding or bruising problems Musculoskeletal: -arthralgias, -myalgias, -joint swelling, -back pain Ophthalmology: -vision changes Urology: -dysuria, -difficulty urinating, -hematuria, -urinary frequency, -urgency Neurology: -headache, -weakness, -tingling, -numbness       Objective:   Physical Exam BP 130/90   Pulse 97   Ht 6' 1.75" (1.873 m)   Wt (!) 325 lb 9.6 oz (147.7 kg)   LMP 01/10/2017   BMI 42.09 kg/m   General Appearance:    Alert, cooperative, no distress, appears stated age  Head:    Normocephalic, without obvious abnormality, atraumatic  Eyes:    PERRL, conjunctiva/corneas clear, EOM's intact  Ears:    Normal TM's and external ear canals  Nose:   Mask in place   Throat:   Mask in place   Neck:   Supple, no lymphadenopathy;  thyroid:  no   enlargement/tenderness/nodules; no JVD  Back:    Spine nontender, no curvature, ROM normal, no CVA     tenderness  Lungs:     Clear to auscultation bilaterally without wheezes, rales or     ronchi; respirations unlabored  Chest Wall:    No tenderness or deformity   Heart:    Regular rate and rhythm, S1 and S2 normal, no murmur, rub   or gallop  Breast Exam:    declines  Abdomen:     Soft, non-tender, nondistended, normoactive bowel sounds,    no masses, no hepatosplenomegaly  Genitalia:    Declines      Extremities:   No clubbing, cyanosis or edema  Pulses:   2+ and symmetric all extremities  Skin:   Skin color, texture, turgor normal, no rashes or lesions  Lymph nodes:   Cervical, supraclavicular, and axillary nodes normal  Neurologic:   CNII-XII intact, normal strength, sensation and gait           Psych:   Normal mood, affect, hygiene and grooming.        Assessment & Plan:  Routine general medical examination at a health care facility - Plan: CBC with Differential/Platelet, Comprehensive metabolic panel, TSH, T4, free, T3 -here today for a fasting CPE. Reviewed preventive health care. She is up to date. Will call and schedule mammogram and her first DEXA. Discussed immunizations, safety and healthy lifestyle.   Type 2 diabetes mellitus with diabetic polyneuropathy, without long-term current use of insulin ([place]) - Plan: Comprehensive metabolic panel, Lipid panel -managed by endocrinologist  Recommend scheduling eye exam.   Morbid obesity ([place]) - Plan: TSH, T4, free, T3, Lipid panel She is losing weight with a healthy diet and exercise. Encouraged her to keep up the good work.   Mild intermittent asthma without complication -has not been an issue. Well controlled.   ASCVD (arteriosclerotic cardiovascular disease) - Plan: Lipid panel -continue statin therapy and follow up pending lipid panel  Night sweats - Plan: HIV Antibody (routine testing w rflx) -suspect menopause. Check labs and follow up  Chronic right-sided low back pain with right-sided sciatica -seeing Dr. Junius Roads and Dr. Ernestina Patches for this  Fatigue, unspecified type - Plan: VITAMIN D 25 Hydroxy (Vit-D Deficiency, Fractures), Vitamin B12 -discussed multiple etiologies for fatigue. Check labs and follow up.

## 2019-10-24 ENCOUNTER — Encounter: Payer: Self-pay | Admitting: Physical Medicine and Rehabilitation

## 2019-10-24 ENCOUNTER — Ambulatory Visit (INDEPENDENT_AMBULATORY_CARE_PROVIDER_SITE_OTHER): Payer: 59 | Admitting: Family Medicine

## 2019-10-24 ENCOUNTER — Ambulatory Visit: Payer: Self-pay

## 2019-10-24 ENCOUNTER — Encounter: Payer: Self-pay | Admitting: Family Medicine

## 2019-10-24 ENCOUNTER — Other Ambulatory Visit: Payer: Self-pay

## 2019-10-24 ENCOUNTER — Ambulatory Visit (INDEPENDENT_AMBULATORY_CARE_PROVIDER_SITE_OTHER): Payer: 59 | Admitting: Physical Medicine and Rehabilitation

## 2019-10-24 VITALS — BP 130/90 | HR 97 | Ht 73.75 in | Wt 325.6 lb

## 2019-10-24 VITALS — BP 161/104 | HR 105

## 2019-10-24 DIAGNOSIS — Z0000 Encounter for general adult medical examination without abnormal findings: Secondary | ICD-10-CM | POA: Diagnosis not present

## 2019-10-24 DIAGNOSIS — M5441 Lumbago with sciatica, right side: Secondary | ICD-10-CM

## 2019-10-24 DIAGNOSIS — M5416 Radiculopathy, lumbar region: Secondary | ICD-10-CM

## 2019-10-24 DIAGNOSIS — I2510 Atherosclerotic heart disease of native coronary artery without angina pectoris: Secondary | ICD-10-CM

## 2019-10-24 DIAGNOSIS — R61 Generalized hyperhidrosis: Secondary | ICD-10-CM | POA: Insufficient documentation

## 2019-10-24 DIAGNOSIS — J4520 Mild intermittent asthma, uncomplicated: Secondary | ICD-10-CM

## 2019-10-24 DIAGNOSIS — E1142 Type 2 diabetes mellitus with diabetic polyneuropathy: Secondary | ICD-10-CM

## 2019-10-24 DIAGNOSIS — R5383 Other fatigue: Secondary | ICD-10-CM | POA: Insufficient documentation

## 2019-10-24 DIAGNOSIS — G8929 Other chronic pain: Secondary | ICD-10-CM | POA: Insufficient documentation

## 2019-10-24 MED ORDER — METHYLPREDNISOLONE ACETATE 80 MG/ML IJ SUSP
40.0000 mg | Freq: Once | INTRAMUSCULAR | Status: AC
  Administered 2019-10-24: 40 mg

## 2019-10-24 NOTE — Progress Notes (Signed)
 .  Numeric Pain Rating Scale and Functional Assessment Average Pain 5   In the last MONTH (on 0-10 scale) has pain interfered with the following?  1. General activity like being  able to carry out your everyday physical activities such as walking, climbing stairs, carrying groceries, or moving a chair?  Rating(6)   +Driver, -BT, -Dye Allergies.  

## 2019-10-26 ENCOUNTER — Encounter: Payer: Self-pay | Admitting: Family Medicine

## 2019-10-28 ENCOUNTER — Other Ambulatory Visit: Payer: Self-pay | Admitting: Family Medicine

## 2019-10-28 ENCOUNTER — Encounter: Payer: Self-pay | Admitting: Family Medicine

## 2019-10-28 ENCOUNTER — Telehealth: Payer: Self-pay | Admitting: Family Medicine

## 2019-10-28 DIAGNOSIS — E559 Vitamin D deficiency, unspecified: Secondary | ICD-10-CM

## 2019-10-28 LAB — COMPREHENSIVE METABOLIC PANEL
ALT: 38 IU/L — ABNORMAL HIGH (ref 0–32)
AST: 22 IU/L (ref 0–40)
Albumin/Globulin Ratio: 1.8 (ref 1.2–2.2)
Albumin: 4.4 g/dL (ref 3.8–4.8)
Alkaline Phosphatase: 132 IU/L — ABNORMAL HIGH (ref 39–117)
BUN/Creatinine Ratio: 19 (ref 9–23)
BUN: 13 mg/dL (ref 6–24)
Bilirubin Total: 0.2 mg/dL (ref 0.0–1.2)
CO2: 21 mmol/L (ref 20–29)
Calcium: 9.9 mg/dL (ref 8.7–10.2)
Chloride: 104 mmol/L (ref 96–106)
Creatinine, Ser: 0.68 mg/dL (ref 0.57–1.00)
GFR calc Af Amer: 124 mL/min/{1.73_m2} (ref >59)
GFR calc non Af Amer: 107 mL/min/{1.73_m2} (ref >59)
Globulin, Total: 2.4 g/dL (ref 1.5–4.5)
Glucose: 121 mg/dL — ABNORMAL HIGH (ref 65–99)
Potassium: 4.8 mmol/L (ref 3.5–5.2)
Sodium: 140 mmol/L (ref 134–144)
Total Protein: 6.8 g/dL (ref 6.0–8.5)

## 2019-10-28 LAB — CBC WITH DIFFERENTIAL/PLATELET
Basophils Absolute: 0 10*3/uL (ref 0.0–0.2)
Basos: 1 %
EOS (ABSOLUTE): 0.1 10*3/uL (ref 0.0–0.4)
Eos: 2 %
Hematocrit: 40.3 % (ref 34.0–46.6)
Hemoglobin: 13.3 g/dL (ref 11.1–15.9)
Immature Grans (Abs): 0 10*3/uL (ref 0.0–0.1)
Immature Granulocytes: 0 %
Lymphocytes Absolute: 1.5 10*3/uL (ref 0.7–3.1)
Lymphs: 23 %
MCH: 27.1 pg (ref 26.6–33.0)
MCHC: 33 g/dL (ref 31.5–35.7)
MCV: 82 fL (ref 79–97)
Monocytes Absolute: 0.4 10*3/uL (ref 0.1–0.9)
Monocytes: 6 %
Neutrophils Absolute: 4.4 10*3/uL (ref 1.4–7.0)
Neutrophils: 68 %
Platelets: 303 10*3/uL (ref 150–450)
RBC: 4.9 x10E6/uL (ref 3.77–5.28)
RDW: 14.2 % (ref 11.7–15.4)
WBC: 6.5 10*3/uL (ref 3.4–10.8)

## 2019-10-28 LAB — HIV ANTIBODY (ROUTINE TESTING W REFLEX): HIV Screen 4th Generation wRfx: NONREACTIVE

## 2019-10-28 LAB — VITAMIN B12: Vitamin B-12: 455 pg/mL (ref 232–1245)

## 2019-10-28 LAB — LIPID PANEL
Chol/HDL Ratio: 2.7 ratio (ref 0.0–4.4)
Cholesterol, Total: 88 mg/dL — ABNORMAL LOW (ref 100–199)
HDL: 33 mg/dL — ABNORMAL LOW (ref >39)
LDL Chol Calc (NIH): 40 mg/dL (ref 0–99)
Triglycerides: 68 mg/dL (ref 0–149)
VLDL Cholesterol Cal: 15 mg/dL (ref 5–40)

## 2019-10-28 LAB — T4, FREE: Free T4: 0.9 ng/dL (ref 0.82–1.77)

## 2019-10-28 LAB — T3: T3, Total: 106 ng/dL (ref 71–180)

## 2019-10-28 LAB — VITAMIN D 25 HYDROXY (VIT D DEFICIENCY, FRACTURES): Vit D, 25-Hydroxy: 9.5 ng/mL — ABNORMAL LOW (ref 30.0–100.0)

## 2019-10-28 LAB — TSH: TSH: 3.06 u[IU]/mL (ref 0.450–4.500)

## 2019-10-28 MED ORDER — VITAMIN D (ERGOCALCIFEROL) 1.25 MG (50000 UNIT) PO CAPS: 50000.0000 [IU] | ORAL_CAPSULE | ORAL | 0 refills | Status: DC

## 2019-10-28 NOTE — Telephone Encounter (Signed)
Received call from Sherwood with [facility] asking if Rx can be refilled for the patient (Tramadol)   The number to contact Marnette Burgess is [phone]

## 2019-10-28 NOTE — Telephone Encounter (Signed)
Please advise 

## 2019-10-28 NOTE — Telephone Encounter (Signed)
Sent!

## 2019-10-30 NOTE — Procedures (Signed)
Lumbar Epidural Steroid Injection - Interlaminar Approach with Fluoroscopic Guidance  Patient: Kelly Mendez      Date of Birth: [birth year] MRN: [id number] PCP: [name], NP-C      Visit Date: 10/24/2019   Universal Protocol:     Consent Given By: the patient  Position: PRONE  Additional Comments: Vital signs were monitored before and after the procedure. Patient was prepped and draped in the usual sterile fashion. The correct patient, procedure, and site was verified.   Injection Procedure Details:  Procedure Site One Meds Administered:  Meds ordered this encounter  Medications  . methylPREDNISolone acetate (DEPO-MEDROL) injection 40 mg     Laterality: Right  Location/Site:  L5-S1  Needle size: 20 G  Needle type: Tuohy  Needle Placement: Paramedian epidural  Findings:   -Comments: Excellent flow of contrast into the epidural space.  Procedure Details: Using a paramedian approach from the side mentioned above, the region overlying the inferior lamina was localized under fluoroscopic visualization and the soft tissues overlying this structure were infiltrated with 4 ml. of 1% Lidocaine without Epinephrine. The Tuohy needle was inserted into the epidural space using a paramedian approach.   The epidural space was localized using loss of resistance along with lateral and bi-planar fluoroscopic views.  After negative aspirate for air, blood, and CSF, a 2 ml. volume of Isovue-250 was injected into the epidural space and the flow of contrast was observed. Radiographs were obtained for documentation purposes.    The injectate was administered into the level noted above.   Additional Comments:  The patient tolerated the procedure well Dressing: 2 x 2 sterile gauze and Band-Aid    Post-procedure details: Patient was observed during the procedure. Post-procedure instructions were reviewed.  Patient left the clinic in stable condition.

## 2019-10-30 NOTE — Progress Notes (Signed)
Kelly Mendez - 44 y.o. female MRN [id number]  Date of birth: 1976/04/20  Office Visit Note: Visit Date: 10/24/2019 PCP: [name], NP-C Referred by: [name], NP-C  Subjective: Chief Complaint  Patient presents with  . Right Thigh - Pain  . Right Lower Leg - Pain   HPI:  Kelly Mendez is a 44 y.o. female who comes in today At the request of Dr. Legrand Como hilts for L5-S1 interlaminar epidural steroid injection.  Patient's case is quite complicated with morbid obesity and type 2 diabetes with history of polyneuropathy.  She also has a history of anxiety and PTSD which I think is really contributing to the overall aspect of her pain.  She does have MRI evidence of multifactorial issues at L5-S1 with broad disc bulging which by itself is usually not an issue but with a facet arthropathy she is getting lateral recess narrowing and likely impacting the S1 nerve root.  No real pinching at this point we had explained that to her but she probably does get irritation of this.  Hopefully injection will help her.  In the future would look at probably preprocedure Valium potentially.  She will follow up with Dr. Junius Roads as needed.  She is currently taking tramadol as well as baclofen and diclofenac without much relief.  ROS Otherwise per HPI.  Assessment & Plan: Visit Diagnoses:  1. Lumbar radiculopathy     Plan: No additional findings.   Meds & Orders:  Meds ordered this encounter  Medications  . methylPREDNISolone acetate (DEPO-MEDROL) injection 40 mg    Orders Placed This Encounter  Procedures  . XR C-ARM NO REPORT  . Epidural Steroid injection    Follow-up: Return for visit to requesting physician as needed.   Procedures: No procedures performed  Lumbar Epidural Steroid Injection - Interlaminar Approach with Fluoroscopic Guidance  Patient: Kelly Mendez      Date of Birth: [birth year] MRN: [id number] PCP: [name],  NP-C      Visit Date: 10/24/2019   Universal Protocol:     Consent Given By: the patient  Position: PRONE  Additional Comments: Vital signs were monitored before and after the procedure. Patient was prepped and draped in the usual sterile fashion. The correct patient, procedure, and site was verified.   Injection Procedure Details:  Procedure Site One Meds Administered:  Meds ordered this encounter  Medications  . methylPREDNISolone acetate (DEPO-MEDROL) injection 40 mg     Laterality: Right  Location/Site:  L5-S1  Needle size: 20 G  Needle type: Tuohy  Needle Placement: Paramedian epidural  Findings:   -Comments: Excellent flow of contrast into the epidural space.  Procedure Details: Using a paramedian approach from the side mentioned above, the region overlying the inferior lamina was localized under fluoroscopic visualization and the soft tissues overlying this structure were infiltrated with 4 ml. of 1% Lidocaine without Epinephrine. The Tuohy needle was inserted into the epidural space using a paramedian approach.   The epidural space was localized using loss of resistance along with lateral and bi-planar fluoroscopic views.  After negative aspirate for air, blood, and CSF, a 2 ml. volume of Isovue-250 was injected into the epidural space and the flow of contrast was observed. Radiographs were obtained for documentation purposes.    The injectate was administered into the level noted above.   Additional Comments:  The patient tolerated the procedure well Dressing: 2 x 2 sterile gauze and Band-Aid  Post-procedure details: Patient was observed during the procedure. Post-procedure instructions were reviewed.  Patient left the clinic in stable condition.    Clinical History: MRI LUMBAR SPINE WITHOUT CONTRAST    TECHNIQUE:  Multiplanar, multisequence MR imaging of the lumbar spine was  performed. No intravenous contrast was administered.     COMPARISON: None.    FINDINGS:  Segmentation: There are 5 non-rib bearing lumbar type vertebral  bodies with the last intervertebral disc space labeled as L5-S1.    Alignment: There is a minimal retrolisthesis of L5 on S1.    Vertebrae: The vertebral body heights are well maintained. No  fracture, marrow edema,or pathologic marrow infiltration.    Conus medullaris and cauda equina: Conus extends to the L1 level.  Conus and cauda equina appear normal.    Paraspinal and other soft tissues: The paraspinal soft tissues and  visualized retroperitoneal structures are unremarkable. The  sacroiliac joints are intact. There is a T2 bright lesion seen  within the upper pole the right kidney.    Disc levels:    T12-L1: No significant canal or neural foraminal narrowing.    L1-L2:  No significant canal or neural foraminal narrowing.    L2-L3:  No significant canal or neural foraminal narrowing.    L3-L4: There is a broad-based disc bulge with ligamentum flavum  hypertrophy which causes mild bilateral neural foraminal narrowing.    L4-L5: There is a broad-based disc bulge with ligamentum flavum  hypertrophy which causes mild bilateral neural foraminal narrowing  and mild central canal stenosis.    L5-S1: There is a broad-based disc bulge with a central disc  protrusion which contacts the bilateral descending S1 nerve roots  without impingement. There is ligamentum flavum hypertrophy and  facet arthrosis which causes moderate bilateral neural foraminal  narrowing.    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 There is moderate bilateral neural foraminal  narrowing.      Electronically Signed  By: Prudencio Pair M.D.  On: 07/24/2019 02:31     Objective:  VS:  HT:    WT:   BMI:     BP:(!) 161/104  HR:(!) 105bpm  TEMP: ( )   RESP:  Physical Exam  Ortho Exam Imaging: No results found.

## 2019-11-03 ENCOUNTER — Encounter: Payer: Self-pay | Admitting: Family Medicine

## 2019-11-15 ENCOUNTER — Other Ambulatory Visit: Payer: Self-pay | Admitting: Family Medicine

## 2019-11-23 ENCOUNTER — Encounter: Payer: Self-pay | Admitting: Family Medicine

## 2019-11-23 DIAGNOSIS — M5441 Lumbago with sciatica, right side: Secondary | ICD-10-CM

## 2019-11-23 NOTE — Addendum Note (Signed)
Addended by: Hortencia Pilar on: 11/23/2019 03:17 PM   Modules accepted: Orders

## 2019-11-25 ENCOUNTER — Other Ambulatory Visit: Payer: Self-pay | Admitting: Oncology

## 2019-11-25 ENCOUNTER — Ambulatory Visit
Admission: RE | Admit: 2019-11-25 | Discharge: 2019-11-25 | Disposition: A | Discharge status: Home / Self Care | Payer: 59 | Source: Ambulatory Visit | Attending: Oncology | Admitting: Oncology

## 2019-11-25 ENCOUNTER — Other Ambulatory Visit: Payer: Self-pay

## 2019-11-25 DIAGNOSIS — R0683 Snoring: Secondary | ICD-10-CM

## 2019-11-25 DIAGNOSIS — N6489 Other specified disorders of breast: Secondary | ICD-10-CM

## 2019-11-25 DIAGNOSIS — F4310 Post-traumatic stress disorder, unspecified: Secondary | ICD-10-CM

## 2019-11-25 DIAGNOSIS — C50211 Malignant neoplasm of upper-inner quadrant of right female breast: Secondary | ICD-10-CM

## 2019-11-25 DIAGNOSIS — E1142 Type 2 diabetes mellitus with diabetic polyneuropathy: Secondary | ICD-10-CM

## 2019-11-25 DIAGNOSIS — J4520 Mild intermittent asthma, uncomplicated: Secondary | ICD-10-CM

## 2019-11-25 DIAGNOSIS — D508 Other iron deficiency anemias: Secondary | ICD-10-CM

## 2019-11-25 DIAGNOSIS — Z171 Estrogen receptor negative status [ER-]: Secondary | ICD-10-CM

## 2019-11-29 ENCOUNTER — Other Ambulatory Visit: Payer: Self-pay | Admitting: Family Medicine

## 2019-12-13 ENCOUNTER — Telehealth: Payer: Self-pay | Admitting: Family Medicine

## 2019-12-13 ENCOUNTER — Other Ambulatory Visit: Payer: Self-pay

## 2019-12-13 ENCOUNTER — Ambulatory Visit
Admission: RE | Admit: 2019-12-13 | Discharge: 2019-12-13 | Disposition: A | Discharge status: Home / Self Care | Payer: 59 | Source: Ambulatory Visit | Attending: Oncology | Admitting: Oncology

## 2019-12-13 ENCOUNTER — Other Ambulatory Visit: Payer: Self-pay | Admitting: Oncology

## 2019-12-13 DIAGNOSIS — N6489 Other specified disorders of breast: Secondary | ICD-10-CM

## 2019-12-13 DIAGNOSIS — C50211 Malignant neoplasm of upper-inner quadrant of right female breast: Secondary | ICD-10-CM

## 2019-12-13 DIAGNOSIS — Z171 Estrogen receptor negative status [ER-]: Secondary | ICD-10-CM

## 2019-12-13 HISTORY — PX: BREAST BIOPSY: SHX20

## 2019-12-13 NOTE — Telephone Encounter (Signed)
Have you seen a fax? I do not recall seeing this today, as of yet.

## 2019-12-13 NOTE — Telephone Encounter (Signed)
Marnette Burgess with [facility] called wanting to confirm that we received a fax. Marnette Burgess would like a call back when the Fax gets to [place].   CB# [phone]

## 2019-12-14 ENCOUNTER — Telehealth: Payer: Self-pay

## 2019-12-14 MED ORDER — TRAMADOL HCL 50 MG PO TABS: 50.0000 mg | ORAL_TABLET | Freq: Three times a day (TID) | ORAL | 0 refills | Status: DC | PRN

## 2019-12-14 NOTE — Addendum Note (Signed)
Addended by: Hortencia Pilar on: 12/14/2019 02:19 PM   Modules accepted: Orders

## 2019-12-14 NOTE — Telephone Encounter (Signed)
Sent!

## 2019-12-14 NOTE — Telephone Encounter (Signed)
Please advise on Tramadol refill.

## 2019-12-14 NOTE — Telephone Encounter (Signed)
[facility] called concerning a Rx refill for Tramadol.  Cb# [phone].  Please advise.  Thank you.

## 2019-12-15 ENCOUNTER — Other Ambulatory Visit: Payer: Self-pay

## 2019-12-15 NOTE — Telephone Encounter (Signed)
Was taken care of yesterday.

## 2019-12-15 NOTE — Telephone Encounter (Signed)
No, I do not recall seeing anything thus far.

## 2019-12-16 ENCOUNTER — Other Ambulatory Visit: Payer: Self-pay | Admitting: Family Medicine

## 2019-12-16 ENCOUNTER — Other Ambulatory Visit: Payer: Self-pay | Admitting: Endocrinology

## 2019-12-16 NOTE — Progress Notes (Signed)
Name: Kelly Mendez  Age/ Sex: 44 y.o., female   MRN/ DOB: [id number], 15-Feb-1976     PCP: [name], NP-C   Reason for Endocrinology Evaluation: Type 2 Diabetes Mellitus     Initial Endocrinology Clinic Visit: 09/03/2018    PATIENT IDENTIFIER: Kelly Mendez is a 44 y.o. female with a past medical history of Right breast ca (03/19/2017 S/P lumpectomy, chemo and radiation) and T2DM. The patient has followed with Endocrinology clinic since 09/03/2018 for consultative assistance with management of her diabetes.  DIABETIC HISTORY:  Kelly Mendez was diagnosed with T2DM in 2018. She has been on metformin since diagnosis, trulicity added in 0569 , she has not been on insulin. Her hemoglobin A1c has ranged from 6.7% in 2019, peaking at 8.8% in 2018.    SUBJECTIVE:   During the last visit (08/19/2019): A1c 7.5%. Restarted Metformin and Trulicity    Today (01/20/4800): Kelly Mendez is here for a follow up on her diabetes management.  She has been checking her sugar multiple x/ day. She patient has  had hypoglycemic episodes since the last clinic visit, occurring sporadically but has noted CGM discrepency with her numbers.   Has a pinched nerve in the back, had a steroid injection last month.  Has been using weight watches    HOME DIABETES REGIMEN:   Metformin 1000 mg twice daily  Trulicity 1.5 mg weekly   Statin: yes ACE-I/ARB: no    CONTINUOUS GLUCOSE MONITORING RECORD INTERPRETATION    Dates of Recording: 5/25-12/19/2019  Sensor description: [employer]   Results statistics:   CGM use % of time 32  Average and SD 114  Time in range     98   %  % Time Above 180 1  % Time above 250 0  % Time Below target 1     Glycemic patterns summary: Optimaly control through the day and night, occasional hyperglycemia late evening   Hyperglycemic episodes  rare  Hypoglycemic episodes occurred post-prandial   Overnight periods:  stable      DIABETIC COMPLICATIONS: Microvascular complications:   Neuropathy   Denies: CKD, retinopathy  Last eye exam: Completed 2018  Macrovascular complications:    Denies: CAD, PVD, CVA     PHYSICAL EXAM: VS: LMP 01/10/2017    EXAM: General: Pt appears well and is in NAD  Lungs: Clear with good BS bilat with no rales, rhonchi, or wheezes  Abdomen: Normoactive bowel sounds, soft, nontender, without masses or organomegaly palpable  Extremities:  BL LE: no pretibial edema normal   Skin: Hair: texture and amount normal with gender appropriate distribution Skin Inspection: no rashes   Mental Status: Judgment, insight: intact Orientation: oriented to time, place, and person Mood and affect: no depression, anxiety, or agitation   DM Foot exam 08/19/2019 The skin of the feet is intact without sores or ulcerations. The pedal pulses are 2+ on right and 2+ on left. The sensation is decreased to a screening 5.07, 10 gram monofilament bilaterally   HISTORY:  Past Medical History:  Past Medical History:  Diagnosis Date  . Abnormal Pap smear of cervix   . Anxiety   . Asthma   . Asymptomatic microscopic hematuria 09/29/2017  . 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 Concussion    around age 62  . Diabetes type 2, uncontrolled ([place])    new diagnosis in 08/2016  . DVT (deep  venous thrombosis) ([place]) 11/24/2016   in rt arm  . Family history of breast cancer   . Family history of neurofibromatosis   . Family history of ovarian cancer   . History of radiation therapy 04/21/17- 05/20/17   Right Breast 40.05 Gy in 15 fractions followed by a 10 Gy boost in 5 fractions to yield a total dose of 50.05 Gy  . HPV in female   . Hyperlipemia   . Malignant neoplasm of upper-inner quadrant of right breast in female, estrogen receptor negative ([place]) 10/21/2016  . Neuromuscular disorder (HCC)    neuropathy  . Pelvic mass in female 06/16/2017  . Personal  history of chemotherapy 2018  . Personal history of radiation therapy 2018  . Pneumonia    2014   . PTSD (post-traumatic stress disorder)    Past Surgical History:  Past Surgical History:  Procedure Laterality Date  . BREAST LUMPECTOMY Right 03/18/2017  . BREAST LUMPECTOMY WITH RADIOACTIVE SEED AND SENTINEL LYMPH NODE BIOPSY Right 03/18/2017   Procedure: RIGHT BREAST LUMPECTOMY WITH RADIOACTIVE SEED AND RIGHT SENTINEL LYMPH NODE BIOPSY;  Surgeon: [name], MD;  Location: [facility];  Service: General;  Laterality: Right;  . COLPOSCOPY    . left ovary removed  1978   was told her ovary was removed but Dr. Denman George found that was not the case. Suspect this was originally an ovarian cystectomy  . PORTACATH PLACEMENT Right 10/29/2016   Procedure: INSERTION PORT-A-CATH WITH [place];  Surgeon: [name], MD;  Location: [facility];  Service: General;  Laterality: Right;  . portacath removal     oct. 5 2018  . ROBOTIC ASSISTED TOTAL HYSTERECTOMY Bilateral 06/16/2017   Procedure: XI ROBOTIC ASSISTED TOTAL HYSTERECTOMY WITH BILATERAL SALPINGECTOMY,  OOPHERECTOMY;  Surgeon: [name], MD;  Location: [facility];  Service: Gynecology;  Laterality: Bilateral;    Social History:  reports that she quit smoking about 3 years ago. Her smoking use included cigarettes. She has a 3.75 pack-year smoking history. She has never used smokeless tobacco. She reports current alcohol use of about 1.0 standard drinks of alcohol per week. She reports that she does not use drugs. Family History:  Family History  Problem Relation Age of Onset  . Breast cancer Mother 28  . Hepatitis C Father   . Ovarian cancer Maternal Aunt        dx in her 14s  . Neurofibromatosis Maternal Uncle   . Brain cancer Maternal Uncle 58  . Lung cancer Maternal Grandfather   . Kidney failure Paternal Grandmother   . Heart attack Paternal Grandfather   . Ovarian cancer Maternal Aunt        dx in her 39s  . Neurofibromatosis  Maternal Aunt   . Ovarian cancer Maternal Aunt   . Neurofibromatosis Maternal Aunt   . Cervical cancer Maternal Aunt   . Neurofibromatosis Maternal Aunt   . Cancer Maternal Aunt        cancer on the bottom of her foot  . Breast cancer Other        MGF's sisters  . Neurofibromatosis Other        MGM's paternal aunt     HOME MEDICATIONS: Allergies as of 12/19/2019      Reactions   Lisinopril    Cough, itchy throat   Penicillins Other (See Comments)   As a child Has patient had a PCN reaction causing immediate rash, facial/tongue/throat swelling, SOB or lightheadedness with hypotension: unknown Has patient had a PCN  reaction causing severe rash involving mucus membranes or skin necrosis: unknown Has patient had a PCN reaction that required hospitalization unknown Has patient had a PCN reaction occurring within the last 10 years: no If all of the above answers are "NO", then may proceed with Cephalosporin use.      Medication List       Accurate as of December 19, 2019  9:12 AM. If you have any questions, ask your nurse or doctor.        albuterol 108 (90 Base) MCG/ACT inhaler Commonly known as: ProAir HFA Inhale 2 puffs into the lungs every 6 (six) hours as needed for wheezing or shortness of breath.   atorvastatin 20 MG tablet Commonly known as: LIPITOR TAKE 1 TABLET (20 MG TOTAL) BY MOUTH DAILY.   baclofen 10 MG tablet Commonly known as: LIORESAL TAKE 0.5-1 TABLETS (5-10 MG TOTAL) BY MOUTH 3 (THREE) TIMES DAILY AS NEEDED FOR MUSCLE SPASMS.   diclofenac 75 MG EC tablet Commonly known as: VOLTAREN Take 1 tablet (75 mg total) by mouth 2 (two) times daily as needed.   FreeStyle Libre 2 Sensor Misc 1 Device by Does not apply route as directed.   ibuprofen 200 MG tablet Commonly known as: ADVIL Take 400 mg by mouth daily as needed for headache or moderate pain.   metFORMIN 500 MG 24 hr tablet Commonly known as: GLUCOPHAGE-XR Take 2 tablets (1,000 mg total) by mouth 2  (two) times daily.   traMADol 50 MG tablet Commonly known as: ULTRAM Take 1 tablet (50 mg total) by mouth 3 (three) times daily as needed.   Trulicity 1.5 IW/9.7LG Sopn Generic drug: Dulaglutide Inject 1.5 mg into the skin once a week.   venlafaxine XR 150 MG 24 hr capsule Commonly known as: EFFEXOR-XR TAKE 1 CAPSULE BY MOUTH DAILY WITH BREAKFAST   Vitamin D (Ergocalciferol) 1.25 MG (50000 UNIT) Caps capsule Commonly known as: DRISDOL Take 1 capsule (50,000 Units total) by mouth every 7 (seven) days.         DATA REVIEWED:  Lab Results  Component Value Date   HGBA1C 7.5 (A) 08/19/2019   HGBA1C 7.0 (A) 08/13/2018   HGBA1C 6.7 (A) 01/12/2018   Lab Results  Component Value Date   MICROALBUR 4.5 09/15/2016   LDLCALC 40 10/24/2019   CREATININE 0.68 10/24/2019   Lab Results  Component Value Date   MICRALBCREAT 21 08/13/2018     Lab Results  Component Value Date   CHOL 88 (L) 10/24/2019   HDL 33 (L) 10/24/2019   LDLCALC 40 10/24/2019   TRIG 68 10/24/2019   CHOLHDL 2.7 10/24/2019         ASSESSMENT / PLAN / RECOMMENDATIONS:   1) Type 2 Diabetes Mellitus, Optimally controlled, With neuropathic complications - Most recent A1c of 5.9 %. Goal A1c <7.0 %.  - Praised the pt on weight loss and encouraged her to continue with lifestyle changes  - In review of CGM download 98 % of her readings are within goal , she has recorded hypolycemia BG's on CGM, but pt reports discrepancy in BG readings between CGM and finger stick - Will reduce metformin as below   MEDICATIONS:  Decrease Metformin 500 mg, 2 tabs QAM   Continue Trulicity 1.5 mg weekly  EDUCATION / INSTRUCTIONS:  BG monitoring instructions: Patient is instructed to check her blood sugars 2 times a day, fasting and bedtime.  Call [facility] if: BG persistently < 70 or > 300. . I reviewed the  Rule of 15 for the treatment of hypoglycemia in detail with the patient. Literature  supplied.     2) Diabetic complications:   Eye: Does not have known diabetic retinopathy.  Patient encouraged to have an eye exam ASAP  Neuro/ Feet: Does have known diabetic peripheral neuropathy .   Renal: Patient does not have known baseline CKD. She   is not on an ACEI/ARB at present.    F/U in 6 months    Signed electronically by: Mack Guise, MD  [facility]  [facility] [address] [place], [place] [address] Phone: [phone] FAX: [phone]   CC: [name], NP-C [address] [name] [address] Phone: [phone]  Fax: [phone]  Return to Endocrinology clinic as below: Future Appointments  Date Time Provider [place]  12/26/2019  9:30 AM Magnus Sinning, MD [facility] None  04/27/2020  8:30 AM [name], NP-C [facility]  08/13/2020  1:00 PM [facility] LAB 2 [facility] None  08/13/2020  1:30 PM Magrinat, Virgie Dad, MD [facility] None

## 2019-12-16 NOTE — Telephone Encounter (Signed)
Called pharmacy and spoke with Marnette Burgess and let her know that the Metformin 1000 mg regular is not on current medication list and the patient currently has Metformin XR 500 mg on file. Marnette Burgess stated for me to ignore the request and they have refills on file of the XR 500 mg.

## 2019-12-16 NOTE — Telephone Encounter (Signed)
This pt is now being treated by Dr. Kelton Pillar.

## 2019-12-19 ENCOUNTER — Ambulatory Visit (INDEPENDENT_AMBULATORY_CARE_PROVIDER_SITE_OTHER): Payer: 59 | Admitting: Internal Medicine

## 2019-12-19 ENCOUNTER — Encounter: Payer: Self-pay | Admitting: Internal Medicine

## 2019-12-19 VITALS — BP 112/70 | HR 91 | Temp 98.2°F | Ht 74.0 in | Wt 319.8 lb

## 2019-12-19 DIAGNOSIS — E1142 Type 2 diabetes mellitus with diabetic polyneuropathy: Secondary | ICD-10-CM | POA: Diagnosis not present

## 2019-12-19 DIAGNOSIS — E1165 Type 2 diabetes mellitus with hyperglycemia: Secondary | ICD-10-CM | POA: Diagnosis not present

## 2019-12-19 LAB — POCT GLYCOSYLATED HEMOGLOBIN (HGB A1C): Hemoglobin A1C: 5.9 % — AB (ref 4.0–5.6)

## 2019-12-19 MED ORDER — METFORMIN HCL ER 500 MG PO TB24: 1000.0000 mg | ORAL_TABLET | Freq: Every day | ORAL | 3 refills | Status: DC

## 2019-12-19 MED ORDER — TRULICITY 1.5 MG/0.5ML ~~LOC~~ SOAJ: 1.5000 mg | SUBCUTANEOUS | 3 refills | Status: DC

## 2019-12-19 NOTE — Patient Instructions (Addendum)
-   You have done an AMAZING work. Keep up the good work! - Decrease Metformin 500 mg XR, to 2 tablet with Breakfast ( skip evening dose) - Continue  trulicity to 1.5 mg weekly     HOW TO TREAT LOW BLOOD SUGARS (Blood sugar LESS THAN 70 MG/DL)  Please follow the RULE OF 15 for the treatment of hypoglycemia treatment (when your (blood sugars are less than 70 mg/dL)    STEP 1: Take 15 grams of carbohydrates when your blood sugar is low, which includes:   3-4 GLUCOSE TABS  OR  3-4 OZ OF JUICE OR REGULAR SODA OR  ONE TUBE OF GLUCOSE GEL     STEP 2: RECHECK blood sugar in 15 MINUTES STEP 3: If your blood sugar is still low at the 15 minute recheck --> then, go back to STEP 1 and treat AGAIN with another 15 grams of carbohydrates.

## 2019-12-21 ENCOUNTER — Encounter (INDEPENDENT_AMBULATORY_CARE_PROVIDER_SITE_OTHER): Payer: Self-pay

## 2019-12-26 ENCOUNTER — Telehealth: Payer: Self-pay | Admitting: Family Medicine

## 2019-12-26 ENCOUNTER — Ambulatory Visit (INDEPENDENT_AMBULATORY_CARE_PROVIDER_SITE_OTHER): Payer: 59 | Admitting: Physical Medicine and Rehabilitation

## 2019-12-26 ENCOUNTER — Telehealth: Payer: Self-pay

## 2019-12-26 ENCOUNTER — Telehealth: Payer: Self-pay | Admitting: Physical Medicine and Rehabilitation

## 2019-12-26 ENCOUNTER — Encounter: Payer: Self-pay | Admitting: Physical Medicine and Rehabilitation

## 2019-12-26 ENCOUNTER — Ambulatory Visit: Payer: 59

## 2019-12-26 ENCOUNTER — Other Ambulatory Visit: Payer: Self-pay

## 2019-12-26 VITALS — BP 147/100 | HR 91

## 2019-12-26 DIAGNOSIS — M5416 Radiculopathy, lumbar region: Secondary | ICD-10-CM

## 2019-12-26 MED ORDER — TRAMADOL HCL 50 MG PO TABS: 50.0000 mg | ORAL_TABLET | Freq: Two times a day (BID) | ORAL | 0 refills | Status: DC | PRN

## 2019-12-26 MED ORDER — METHYLPREDNISOLONE ACETATE 80 MG/ML IJ SUSP
80.0000 mg | Freq: Once | INTRAMUSCULAR | Status: AC
  Administered 2019-12-26: 80 mg

## 2019-12-26 MED ORDER — BACLOFEN 10 MG PO TABS: 5.0000 mg | ORAL_TABLET | Freq: Three times a day (TID) | ORAL | 3 refills | Status: DC | PRN

## 2019-12-26 MED ORDER — TRAMADOL HCL 50 MG PO TABS: 50.0000 mg | ORAL_TABLET | Freq: Two times a day (BID) | ORAL | 0 refills | Status: AC | PRN

## 2019-12-26 NOTE — Procedures (Signed)
S1 Lumbosacral Transforaminal Epidural Steroid Injection - Sub-Pedicular Approach with Fluoroscopic Guidance   Patient: Kelly Mendez      Date of Birth: 08/16/1975 MRN: [id number] PCP: [name], NP-C      Visit Date: 12/26/2019   Universal Protocol:    Date/Time: 06/14/211:13 PM  Consent Given By: the patient  Position:  PRONE  Additional Comments: Vital signs were monitored before and after the procedure. Patient was prepped and draped in the usual sterile fashion. The correct patient, procedure, and site was verified.   Injection Procedure Details:  Procedure Site One Meds Administered:  Meds ordered this encounter  Medications  . methylPREDNISolone acetate (DEPO-MEDROL) injection 80 mg    Laterality: Bilateral  Location/Site:  S1 Foramen   Needle size: 22 ga. 5 in.  Needle type: Spinal  Needle Placement: Transforaminal  Findings:   -Comments: Excellent flow of contrast along the nerve and into the epidural space.  Epidurogram: Contrast epidurogram showed no nerve root cut off or restricted flow pattern.  Procedure Details: After squaring off the sacral end-plate to get a true AP view, the C-arm was positioned so that the best possible view of the S1 foramen was visualized. The soft tissues overlying this structure were infiltrated with 2-3 ml. of 1% Lidocaine without Epinephrine.    The spinal needle was inserted toward the target using a "trajectory" view along the fluoroscope beam.  Under AP and lateral visualization, the needle was advanced so it did not puncture dura. Biplanar projections were used to confirm position. Aspiration was confirmed to be negative for CSF and/or blood. A 1-2 ml. volume of Isovue-250 was injected and flow of contrast was noted at each level. Radiographs were obtained for documentation purposes.   After attaining the desired flow of contrast documented above, a 0.5 to 1.0 ml test dose of 0.25% Marcaine was  injected into each respective transforaminal space.  The patient was observed for 90 seconds post injection.  After no sensory deficits were reported, and normal lower extremity motor function was noted,   the above injectate was administered so that equal amounts of the injectate were placed at each foramen (level) into the transforaminal epidural space.   Additional Comments:  The patient tolerated the procedure well Dressing: Band-Aid with 2 x 2 sterile gauze    Post-procedure details: Patient was observed during the procedure. Post-procedure instructions were reviewed.  Patient left the clinic in stable condition.

## 2019-12-26 NOTE — Telephone Encounter (Signed)
For Dr. Junius Roads see message below.

## 2019-12-26 NOTE — Telephone Encounter (Signed)
Precious Bard, RMA  Precious Bard, RMA [facility] she need to call us to make appt.  Per Ander Purpura F work her in if spots are available in the AM 12/27/19 (As of right now he has 2 slots available.)       Previous Messages   ----- Message -----  From: Eunice Blase, MD  Sent: 12/26/2019 11:51 AM EDT  To: Precious Bard, RMA  Subject: Meds                       Rx's sent. Also requested referral to Dr. Louanne Skye to discuss surgical options for her back.

## 2019-12-26 NOTE — Telephone Encounter (Signed)
Holding for you to add to schedule.

## 2019-12-26 NOTE — Telephone Encounter (Signed)
Patient was checking out and wanted next refill to be 30 day supply instead of 10 day .   1.Baclofen 2. Tramadol

## 2019-12-26 NOTE — Telephone Encounter (Signed)
Please advise 

## 2019-12-26 NOTE — Progress Notes (Signed)
Pt states pain in the center of the lower back that radiates into both buttocks, the back of the right leg all the way down, and the back of the left thigh. Pt states last injection 10/24/19 helped some, but did not get rid of all the pain. Sitting and standing makes pain worse. Pain medication helps with pain.   .Numeric Pain Rating Scale and Functional Assessment Average Pain 6   In the last MONTH (on 0-10 scale) has pain interfered with the following?  1. General activity like being  able to carry out your everyday physical activities such as walking, climbing stairs, carrying groceries, or moving a chair?  Rating(7)   +Driver, -BT, -Dye Allergies.

## 2019-12-26 NOTE — Telephone Encounter (Signed)
noted 

## 2019-12-26 NOTE — Telephone Encounter (Signed)
[facility] called asking that we change the pts Tramadol supply from 10 to 30 days and once that's been changed they would like for [place] to approve the order so they can fill it.   [phone]

## 2019-12-26 NOTE — Telephone Encounter (Signed)
Error

## 2019-12-26 NOTE — Progress Notes (Signed)
Kelly Mendez - 44 y.o. female MRN [id number]  Date of birth: [birth year]  Office Visit Note: Visit Date: 12/26/2019 PCP: [name], NP-C Referred by: [name], NP-C  Subjective: Chief Complaint  Patient presents with  . Lower Back - Pain  . Left Thigh - Pain  . Right Leg - Pain   HPI: Kelly Mendez is a 44 y.o. female who comes in today At the request of Dr. Eunice Blase for planned Bilateral S1-2 lumbar transforaminal epidural steroid injection with fluoroscopic guidance.  The patient has failed conservative care including home exercise, medications, time and activity modification.  This injection will be diagnostic and hopefully therapeutic.  Please see requesting physician notes for further details and justification.   Prior L5-S1 interlaminar approach did give her about 40% relief for a few weeks.  MRI reviewed with images and spine model.  MRI reviewed in the note below.   ROS Otherwise per HPI.  Assessment & Plan: Visit Diagnoses:  1. Lumbar radiculopathy     Plan: No additional findings.   Meds & Orders:  Meds ordered this encounter  Medications  . methylPREDNISolone acetate (DEPO-MEDROL) injection 80 mg    Orders Placed This Encounter  Procedures  . XR C-ARM NO REPORT  . Epidural Steroid injection    Follow-up: Return if symptoms worsen or fail to improve.   Procedures: No procedures performed  S1 Lumbosacral Transforaminal Epidural Steroid Injection - Sub-Pedicular Approach with Fluoroscopic Guidance   Patient: Kelly Mendez      Date of Birth: 01/18/1976 MRN: [id number] PCP: [name], NP-C      Visit Date: 12/26/2019   Universal Protocol:    Date/Time: 06/14/211:13 PM  Consent Given By: the patient  Position:  PRONE  Additional Comments: Vital signs were monitored before and after the procedure. Patient was prepped and draped in the usual sterile fashion. The correct patient,  procedure, and site was verified.   Injection Procedure Details:  Procedure Site One Meds Administered:  Meds ordered this encounter  Medications  . methylPREDNISolone acetate (DEPO-MEDROL) injection 80 mg    Laterality: Bilateral  Location/Site:  S1 Foramen   Needle size: 22 ga. 5 in.  Needle type: Spinal  Needle Placement: Transforaminal  Findings:   -Comments: Excellent flow of contrast along the nerve and into the epidural space.  Epidurogram: Contrast epidurogram showed no nerve root cut off or restricted flow pattern.  Procedure Details: After squaring off the sacral end-plate to get a true AP view, the C-arm was positioned so that the best possible view of the S1 foramen was visualized. The soft tissues overlying this structure were infiltrated with 2-3 ml. of 1% Lidocaine without Epinephrine.    The spinal needle was inserted toward the target using a "trajectory" view along the fluoroscope beam.  Under AP and lateral visualization, the needle was advanced so it did not puncture dura. Biplanar projections were used to confirm position. Aspiration was confirmed to be negative for CSF and/or blood. A 1-2 ml. volume of Isovue-250 was injected and flow of contrast was noted at each level. Radiographs were obtained for documentation purposes.   After attaining the desired flow of contrast documented above, a 0.5 to 1.0 ml test dose of 0.25% Marcaine was injected into each respective transforaminal space.  The patient was observed for 90 seconds post injection.  After no sensory deficits were reported, and normal lower extremity motor function was noted,   the above  injectate was administered so that equal amounts of the injectate were placed at each foramen (level) into the transforaminal epidural space.   Additional Comments:  The patient tolerated the procedure well Dressing: Band-Aid with 2 x 2 sterile gauze    Post-procedure details: Patient was observed during the  procedure. Post-procedure instructions were reviewed.  Patient left the clinic in stable condition.     Clinical History: MRI LUMBAR SPINE WITHOUT CONTRAST    TECHNIQUE:  Multiplanar, multisequence MR imaging of the lumbar spine was  performed. No intravenous contrast was administered.    COMPARISON: None.    FINDINGS:  Segmentation: There are 5 non-rib bearing lumbar type vertebral  bodies with the last intervertebral disc space labeled as L5-S1.    Alignment: There is a minimal retrolisthesis of L5 on S1.    Vertebrae: The vertebral body heights are well maintained. No  fracture, marrow edema,or pathologic marrow infiltration.    Conus medullaris and cauda equina: Conus extends to the L1 level.  Conus and cauda equina appear normal.    Paraspinal and other soft tissues: The paraspinal soft tissues and  visualized retroperitoneal structures are unremarkable. The  sacroiliac joints are intact. There is a T2 bright lesion seen  within the upper pole the right kidney.    Disc levels:    T12-L1: No significant canal or neural foraminal narrowing.    L1-L2:  No significant canal or neural foraminal narrowing.    L2-L3:  No significant canal or neural foraminal narrowing.    L3-L4: There is a broad-based disc bulge with ligamentum flavum  hypertrophy which causes mild bilateral neural foraminal narrowing.    L4-L5: There is a broad-based disc bulge with ligamentum flavum  hypertrophy which causes mild bilateral neural foraminal narrowing  and mild central canal stenosis.    L5-S1: There is a broad-based disc bulge with a central disc  protrusion which contacts the bilateral descending S1 nerve roots  without impingement. There is ligamentum flavum hypertrophy and  facet arthrosis which causes moderate bilateral neural foraminal  narrowing.    IMPRESSION:  Lumbar spine spondylosis most notable at L5-S1 with a Sciandra central  disc protrusion which  contacts bilateral descending S1 nerve roots  without impingement. There is moderate bilateral neural foraminal  narrowing.      Electronically Signed  By: Prudencio Pair M.D.  On: 07/24/2019 02:31   She reports that she quit smoking about 3 years ago. Her smoking use included cigarettes. She has a 3.75 pack-year smoking history. She has never used smokeless tobacco.  Recent Labs    08/19/19 1323 12/19/19 0915  HGBA1C 7.5* 5.9*    Objective:  VS:  HT:    WT:   BMI:     BP:(!) 147/100  HR:91bpm  TEMP: ( )  RESP:  Physical Exam Constitutional:      General: She is not in acute distress.    Appearance: Normal appearance. She is obese. She is not ill-appearing.  HENT:     Head: Normocephalic and atraumatic.     Right Ear: External ear normal.     Left Ear: External ear normal.  Eyes:     Extraocular Movements: Extraocular movements intact.  Cardiovascular:     Rate and Rhythm: Normal rate.     Pulses: Normal pulses.  Musculoskeletal:     Right lower leg: No edema.     Left lower leg: No edema.     Comments: Patient has good distal strength with no pain over  the greater trochanters.  No clonus or focal weakness.  Skin:    Findings: No erythema, lesion or rash.  Neurological:     General: No focal deficit present.     Mental Status: She is alert and oriented to person, place, and time.     Sensory: No sensory deficit.     Motor: No weakness or abnormal muscle tone.     Coordination: Coordination normal.  Psychiatric:        Mood and Affect: Mood normal.        Behavior: Behavior normal.     Ortho Exam  Imaging: XR C-ARM NO REPORT  Result Date: 12/26/2019 Please see Notes tab for imaging impression.   Past Medical/Family/Surgical/Social History: Medications & Allergies reviewed per EMR, new medications updated. Patient Active Problem List   Diagnosis Date Noted  . Night sweats 10/24/2019  . Chronic right-sided low back pain with right-sided sciatica  10/24/2019  . Fatigue 10/24/2019  . Type 2 diabetes mellitus with hyperglycemia, without long-term current use of insulin ([place]) 08/19/2019  . Hot flashes 08/13/2018  . Snoring 04/15/2018  . Post traumatic stress disorder (PTSD) 02/25/2018  . Anxiety   . Asymptomatic microscopic hematuria 09/29/2017  . Iron deficiency anemia 08/06/2017  . Adnexal mass, RIGHT 12/16/2016  . Menorrhagia 12/16/2016  . Genetic testing 12/15/2016  . Family history of breast cancer   . Family history of ovarian cancer   . Family history of neurofibromatosis   . Acute deep vein thrombosis (DVT) of axillary vein of right upper extremity ([place]) 11/26/2016  . Type 2 diabetes mellitus with diabetic polyneuropathy, without long-term current use of insulin ([place])   . Malignant neoplasm of upper-inner quadrant of right breast in female, estrogen receptor negative ([place]) 10/21/2016  . Low HDL (under 40) 09/16/2016  . ASCVD (arteriosclerotic cardiovascular disease) 09/16/2016  . Light cigarette smoker 09/15/2016  . Morbid obesity ([place]) 09/15/2016  . Mild intermittent asthma 09/15/2016   Past Medical History:  Diagnosis Date  . Abnormal Pap smear of cervix   . Anxiety   . Asthma   . Asymptomatic microscopic hematuria 09/29/2017  . 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 Concussion    around age 49  . Diabetes type 2, uncontrolled ([place])    new diagnosis in 08/2016  . DVT (deep venous thrombosis) ([place]) 11/24/2016   in rt arm  . Family history of breast cancer   . Family history of neurofibromatosis   . Family history of ovarian cancer   . History of radiation therapy 04/21/17- 05/20/17   Right Breast 40.05 Gy in 15 fractions followed by a 10 Gy boost in 5 fractions to yield a total dose of 50.05 Gy  . HPV in female   . Hyperlipemia   . Malignant neoplasm of upper-inner quadrant of right breast in female, estrogen receptor negative ([place]) 10/21/2016  . Neuromuscular disorder (HCC)     neuropathy  . Pelvic mass in female 06/16/2017  . Personal history of chemotherapy 2018  . Personal history of radiation therapy 2018  . Pneumonia    2014   . PTSD (post-traumatic stress disorder)    Family History  Problem Relation Age of Onset  . Breast cancer Mother 67  . Hepatitis C Father   . Ovarian cancer Maternal Aunt        dx in her 58s  . Neurofibromatosis Maternal Uncle   . Brain cancer Maternal Uncle 58  .  Lung cancer Maternal Grandfather   . Kidney failure Paternal Grandmother   . Heart attack Paternal Grandfather   . Ovarian cancer Maternal Aunt        dx in her 57s  . Neurofibromatosis Maternal Aunt   . Ovarian cancer Maternal Aunt   . Neurofibromatosis Maternal Aunt   . Cervical cancer Maternal Aunt   . Neurofibromatosis Maternal Aunt   . Cancer Maternal Aunt        cancer on the bottom of her foot  . Breast cancer Other        MGF's sisters  . Neurofibromatosis Other        MGM's paternal aunt   Past Surgical History:  Procedure Laterality Date  . BREAST LUMPECTOMY Right 03/18/2017  . BREAST LUMPECTOMY WITH RADIOACTIVE SEED AND SENTINEL LYMPH NODE BIOPSY Right 03/18/2017   Procedure: RIGHT BREAST LUMPECTOMY WITH RADIOACTIVE SEED AND RIGHT SENTINEL LYMPH NODE BIOPSY;  Surgeon: [name], MD;  Location: [facility];  Service: General;  Laterality: Right;  . COLPOSCOPY    . left ovary removed  1978   was told her ovary was removed but Dr. Denman George found that was not the case. Suspect this was originally an ovarian cystectomy  . PORTACATH PLACEMENT Right 10/29/2016   Procedure: INSERTION PORT-A-CATH WITH [place];  Surgeon: [name], MD;  Location: [facility];  Service: General;  Laterality: Right;  . portacath removal     oct. 5 2018  . ROBOTIC ASSISTED TOTAL HYSTERECTOMY Bilateral 06/16/2017   Procedure: XI ROBOTIC ASSISTED TOTAL HYSTERECTOMY WITH BILATERAL SALPINGECTOMY,  OOPHERECTOMY;  Surgeon: [name], MD;  Location: [facility];  Service:  Gynecology;  Laterality: Bilateral;   Social History   Occupational History  . Not on file  Tobacco Use  . Smoking status: Former Smoker    Packs/day: 0.15    Years: 25.00    Pack years: 3.75    Types: Cigarettes    Quit date: 11/25/2016    Years since quitting: 3.0  . Smokeless tobacco: Never Used  Vaping Use  . Vaping Use: Never used  Substance and Sexual Activity  . Alcohol use: Yes    Alcohol/week: 1.0 standard drink    Types: 1 Glasses of wine per week    Comment: rarely   . Drug use: No  . Sexual activity: Not Currently    Birth control/protection: Surgical    Comment: husband vasectomy

## 2019-12-26 NOTE — Telephone Encounter (Signed)
Rx sent 

## 2019-12-27 NOTE — Telephone Encounter (Signed)
Sched for 02/02/20 @ 1030

## 2020-01-12 ENCOUNTER — Encounter: Payer: Self-pay | Admitting: Oncology

## 2020-01-27 ENCOUNTER — Ambulatory Visit: Payer: 59 | Admitting: Specialist

## 2020-01-30 ENCOUNTER — Encounter: Payer: Self-pay | Admitting: Family Medicine

## 2020-01-30 ENCOUNTER — Other Ambulatory Visit: Payer: Self-pay | Admitting: Oncology

## 2020-01-30 MED ORDER — GABAPENTIN 100 MG PO CAPS: ORAL_CAPSULE | ORAL | 3 refills | Status: DC

## 2020-02-02 ENCOUNTER — Ambulatory Visit: Payer: 59 | Admitting: Specialist

## 2020-02-09 ENCOUNTER — Ambulatory Visit (INDEPENDENT_AMBULATORY_CARE_PROVIDER_SITE_OTHER): Payer: 59 | Admitting: Specialist

## 2020-02-09 ENCOUNTER — Other Ambulatory Visit: Payer: Self-pay

## 2020-02-09 ENCOUNTER — Ambulatory Visit (INDEPENDENT_AMBULATORY_CARE_PROVIDER_SITE_OTHER): Payer: 59

## 2020-02-09 ENCOUNTER — Encounter: Payer: Self-pay | Admitting: Specialist

## 2020-02-09 VITALS — BP 134/83 | HR 89 | Ht 74.0 in | Wt 320.0 lb

## 2020-02-09 DIAGNOSIS — M5441 Lumbago with sciatica, right side: Secondary | ICD-10-CM

## 2020-02-09 DIAGNOSIS — M5136 Other intervertebral disc degeneration, lumbar region: Secondary | ICD-10-CM

## 2020-02-09 DIAGNOSIS — M4807 Spinal stenosis, lumbosacral region: Secondary | ICD-10-CM | POA: Diagnosis not present

## 2020-02-09 DIAGNOSIS — M4726 Other spondylosis with radiculopathy, lumbar region: Secondary | ICD-10-CM | POA: Diagnosis not present

## 2020-02-09 NOTE — Progress Notes (Signed)
Office Visit Note   Patient: Kelly Mendez           Date of Birth: 01-12-1976           MRN: [id number] Visit Date: 02/09/2020              Requested by: [name], NP-C [place],  [place] [address] PCP: [name], NP-C   Assessment & Plan: Visit Diagnoses:  1. Acute right-sided low back pain with right-sided sciatica   2. Spinal stenosis of lumbosacral region   3. Other spondylosis with radiculopathy, lumbar region   4. Degenerative disc disease, lumbar     Plan: Avoid bending, stooping and avoid lifting weights greater than 10 lbs. Avoid prolong standing and walking. Avoid frequent bending and stooping  No lifting greater than 10 lbs. May use ice or moist heat for pain. Weight loss is of benefit. Handicap license is approved. Dr. Romona Curls secretary/Assistant will call to arrange for epidural steroid injection  Follow-Up Instructions: No follow-ups on file.   Orders:  Orders Placed This Encounter  Procedures  . XR Lumbar Spine 2-3 Views  . Ambulatory referral to Physical Medicine Rehab   No orders of the defined types were placed in this encounter.     Procedures: No procedures performed   Clinical Data: No additional findings.   Subjective: Chief Complaint  Patient presents with  . Lower Back - Pain    She had Bilateral S1 TF injection on 12/26/19 with Dr. Ernestina Patches, she states that she does get some relief with injections, enough that the meds become effective, then it where's off.    44 year old female with about 7 month history of back pain started 06/20/2019. She awoke with pain and difficulty standing and walking. Had to crawl to the bathroom, push up of bed to get out of bed. She had pain >10 of 10 and now it is about 4 of ten. She has been seeing Dr. Junius Roads and Dr. Debria Garret an MRI of the lumbar spine in January 2021 and has ESIs from Dr. Ernestina Patches. Has been to PT and had dry needling. Helped the muscle  spasms but not he pain shooting into the leg. Originally right hip in the posterior right leg down to the right heel and since it has moved into the back of the left thigh not below the back of the left knee. No bowel incontinence but urgency of the bladder, again not incontinent. Good sensation in the lower groin. History of neuropathy in the legs, Told that that was due to chemoRx and some diabetes. She was treated for breast cancer in 2018, and is now cancer free. Had a lumpectomy and a sentinel node eval, all nodes were negative. Then Had TAH and BSO. Now with pain is worse with some things but not always with bending and stooping. Leaning forward and lying on the side with a pillow between knees usually helps. Picking up off counter may hurt but leaning and picking up of floor may not. No meds this AM but last night. She is taking diclofenac BID, baclofen TID and tramadol at night. Gabapentin 100mg  TID and at night and does put her to sleep. The gabapentin does help. She smokes CBD, or hemp CBD and this does help some with the pain. Works at [employer], takes meds and has a standing desk. Getting up she is stiff. She is working with a standing desk, about 40 hours per week 8  hr per day but it is slow with this time of the year. She would be unable to walk a mile. Has difficulty walking short distances, a 5-7K step pain is greater, 2-3 K is better. Tends to lean on the cart to get around the store and sons help as well. Drags the right foot when walking and has weakness into the right leg. Paresthesias right leg. The right hip feels like it doesn't sit right. The whole right leg feels like it is dragging behind.    Review of Systems  Constitutional: Positive for activity change. Negative for appetite change, chills, diaphoresis, fatigue and fever.  HENT: Negative.  Negative for congestion, dental problem, drooling, ear discharge, ear pain, facial swelling, hearing loss, mouth sores, nosebleeds, postnasal  drip, rhinorrhea, sinus pressure, sinus pain and sneezing.   Eyes: Negative.  Negative for photophobia, pain, discharge, redness, itching and visual disturbance.  Respiratory: Negative.  Negative for apnea, cough, choking, chest tightness, shortness of breath, wheezing and stridor.   Cardiovascular: Negative.  Negative for chest pain, palpitations and leg swelling.  Gastrointestinal: Positive for constipation. Negative for abdominal distention, abdominal pain, anal bleeding, blood in stool, diarrhea, nausea, rectal pain and vomiting.  Endocrine: Negative for cold intolerance, heat intolerance, polydipsia, polyphagia and polyuria.  Genitourinary: Positive for frequency and urgency. Negative for difficulty urinating, dyspareunia, dysuria, enuresis, flank pain, genital sores, hematuria, menstrual problem and pelvic pain.  Musculoskeletal: Positive for back pain and gait problem. Negative for arthralgias, joint swelling, myalgias, neck pain and neck stiffness.  Skin: Negative.  Negative for color change, pallor, rash and wound.  Allergic/Immunologic: Negative for environmental allergies, food allergies and immunocompromised state.  Neurological: Positive for weakness. Negative for dizziness, tremors, seizures, syncope, facial asymmetry, speech difficulty, light-headedness, numbness and headaches.     Objective: Vital Signs: BP (!) 134/83 (BP Location: Left Arm, Patient Position: Sitting)   Pulse 89   Ht 6\' 2"  (1.88 m)   Wt (!) 320 lb (145.2 kg)   LMP 01/10/2017   BMI 41.09 kg/m   Physical Exam Constitutional:      Appearance: She is well-developed.  HENT:     Head: Normocephalic and atraumatic.  Eyes:     Pupils: Pupils are equal, round, and reactive to light.  Pulmonary:     Effort: Pulmonary effort is normal.     Breath sounds: Normal breath sounds.  Abdominal:     General: Bowel sounds are normal.     Palpations: Abdomen is soft.  Musculoskeletal:        General: Normal range of  motion.     Cervical back: Normal range of motion and neck supple.  Skin:    General: Skin is warm and dry.  Neurological:     Mental Status: She is alert and oriented to person, place, and time.  Psychiatric:        Behavior: Behavior normal.        Thought Content: Thought content normal.        Judgment: Judgment normal.     Ortho Exam  Specialty Comments:  No specialty comments available.  Imaging: XR Lumbar Spine 2-3 Views  Result Date: 02/09/2020 AP and lateral flexion and extension radiographs with minimal anterolisthesis L4-5 with posterior disc widening, overall the disc Height is good, there is retrolisthesis of L5 on S1. MRI with right L5-S1 lateral recess stenosis affecting right S1 root and there is narrowing of the right L5 neuroforamen due to spondylosis and disc bulge.  PMFS History: Patient Active Problem List   Diagnosis Date Noted  . Night sweats 10/24/2019  . Chronic right-sided low back pain with right-sided sciatica 10/24/2019  . Fatigue 10/24/2019  . Type 2 diabetes mellitus with hyperglycemia, without long-term current use of insulin ([place] 08/19/2019  . Hot flashes 08/13/2018  . Snoring 04/15/2018  . Post traumatic stress disorder (PTSD) 02/25/2018  . Anxiety   . Asymptomatic microscopic hematuria 09/29/2017  . Iron deficiency anemia 08/06/2017  . Adnexal mass, RIGHT 12/16/2016  . Menorrhagia 12/16/2016  . Genetic testing 12/15/2016  . Family history of breast cancer   . Family history of ovarian cancer   . Family history of neurofibromatosis   . Acute deep vein thrombosis (DVT) of axillary vein of right upper extremity ([place]) 11/26/2016  . Type 2 diabetes mellitus with diabetic polyneuropathy, without long-term current use of insulin ([place])   . Malignant neoplasm of upper-inner quadrant of right breast in female, estrogen receptor negative ([place]) 10/21/2016  . Low HDL (under 40) 09/16/2016  . ASCVD (arteriosclerotic cardiovascular disease)  09/16/2016  . Light cigarette smoker 09/15/2016  . Morbid obesity ([place]) 09/15/2016  . Mild intermittent asthma 09/15/2016   Past Medical History:  Diagnosis Date  . Abnormal Pap smear of cervix   . Anxiety   . Asthma   . Asymptomatic microscopic hematuria 09/29/2017  . 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 Concussion    around age 51  . Diabetes type 2, uncontrolled ([place])    new diagnosis in 08/2016  . DVT (deep venous thrombosis) ([place]) 11/24/2016   in rt arm  . Family history of breast cancer   . Family history of neurofibromatosis   . Family history of ovarian cancer   . History of radiation therapy 04/21/17- 05/20/17   Right Breast 40.05 Gy in 15 fractions followed by a 10 Gy boost in 5 fractions to yield a total dose of 50.05 Gy  . HPV in female   . Hyperlipemia   . Malignant neoplasm of upper-inner quadrant of right breast in female, estrogen receptor negative ([place]) 10/21/2016  . Neuromuscular disorder (HCC)    neuropathy  . Pelvic mass in female 06/16/2017  . Personal history of chemotherapy 2018  . Personal history of radiation therapy 2018  . Pneumonia    2014   . PTSD (post-traumatic stress disorder)     Family History  Problem Relation Age of Onset  . Breast cancer Mother 3  . Hepatitis C Father   . Ovarian cancer Maternal Aunt        dx in her 80s  . Neurofibromatosis Maternal Uncle   . Brain cancer Maternal Uncle 58  . Lung cancer Maternal Grandfather   . Kidney failure Paternal Grandmother   . Heart attack Paternal Grandfather   . Ovarian cancer Maternal Aunt        dx in her 53s  . Neurofibromatosis Maternal Aunt   . Ovarian cancer Maternal Aunt   . Neurofibromatosis Maternal Aunt   . Cervical cancer Maternal Aunt   . Neurofibromatosis Maternal Aunt   . Cancer Maternal Aunt        cancer on the bottom of her foot  . Breast cancer Other        MGF's sisters  . Neurofibromatosis Other        MGM's paternal aunt    Past  Surgical History:  Procedure Laterality Date  . BREAST LUMPECTOMY Right  03/18/2017  . BREAST LUMPECTOMY WITH RADIOACTIVE SEED AND SENTINEL LYMPH NODE BIOPSY Right 03/18/2017   Procedure: RIGHT BREAST LUMPECTOMY WITH RADIOACTIVE SEED AND RIGHT SENTINEL LYMPH NODE BIOPSY;  Surgeon: [name], MD;  Location: [facility];  Service: General;  Laterality: Right;  . COLPOSCOPY    . left ovary removed  1978   was told her ovary was removed but Dr. Denman George found that was not the case. Suspect this was originally an ovarian cystectomy  . PORTACATH PLACEMENT Right 10/29/2016   Procedure: INSERTION PORT-A-CATH WITH [place];  Surgeon: [name], MD;  Location: [facility];  Service: General;  Laterality: Right;  . portacath removal     oct. 5 2018  . ROBOTIC ASSISTED TOTAL HYSTERECTOMY Bilateral 06/16/2017   Procedure: XI ROBOTIC ASSISTED TOTAL HYSTERECTOMY WITH BILATERAL SALPINGECTOMY,  OOPHERECTOMY;  Surgeon: [name], MD;  [facility];  Service: Gynecology;  Laterality: Bilateral;   Social History   Occupational History  . Not on file  Tobacco Use  . Smoking status: Former Smoker    Packs/day: 0.15    Years: 25.00    Pack years: 3.75    Types: Cigarettes    Quit date: 11/25/2016    Years since quitting: 3.2  . Smokeless tobacco: Never Used  Vaping Use  . Vaping Use: Never used  Substance and Sexual Activity  . Alcohol use: Yes    Alcohol/week: 1.0 standard drink    Types: 1 Glasses of wine per week    Comment: rarely   . Drug use: No  . Sexual activity: Not Currently    Birth control/protection: Surgical    Comment: husband vasectomy

## 2020-02-09 NOTE — Patient Instructions (Signed)
Avoid bending, stooping and avoid lifting weights greater than 10 lbs. Avoid prolong standing and walking. Avoid frequent bending and stooping  No lifting greater than 10 lbs. May use ice or moist heat for pain. Weight loss is of benefit. Handicap license is approved. Dr. Newton's secretary/Assistant will call to arrange for epidural steroid injection  

## 2020-02-14 ENCOUNTER — Encounter: Payer: Self-pay | Admitting: Specialist

## 2020-02-20 ENCOUNTER — Other Ambulatory Visit: Payer: Self-pay | Admitting: Specialist

## 2020-02-20 MED ORDER — TRAMADOL HCL 50 MG PO TABS: 100.0000 mg | ORAL_TABLET | Freq: Four times a day (QID) | ORAL | 0 refills | Status: DC | PRN

## 2020-02-28 ENCOUNTER — Other Ambulatory Visit: Payer: Self-pay | Admitting: Specialist

## 2020-03-02 ENCOUNTER — Telehealth: Payer: Self-pay

## 2020-03-02 ENCOUNTER — Other Ambulatory Visit: Payer: Self-pay | Admitting: Specialist

## 2020-03-02 NOTE — Telephone Encounter (Signed)
Please advise 

## 2020-03-02 NOTE — Telephone Encounter (Signed)
Marnette Burgess from pharmacy called in to  Check & see if dr Louanne Skye has received the order for tramadol.  Says she sent it in this morning

## 2020-03-05 ENCOUNTER — Other Ambulatory Visit: Payer: Self-pay | Admitting: Specialist

## 2020-03-05 ENCOUNTER — Other Ambulatory Visit: Payer: Self-pay

## 2020-03-05 ENCOUNTER — Encounter: Payer: Self-pay | Admitting: Specialist

## 2020-03-05 ENCOUNTER — Ambulatory Visit (INDEPENDENT_AMBULATORY_CARE_PROVIDER_SITE_OTHER): Payer: 59 | Admitting: Specialist

## 2020-03-05 VITALS — BP 139/94 | HR 101 | Temp 98.5°F | Ht 74.0 in | Wt 320.0 lb

## 2020-03-05 DIAGNOSIS — M5136 Other intervertebral disc degeneration, lumbar region: Secondary | ICD-10-CM | POA: Diagnosis not present

## 2020-03-05 DIAGNOSIS — G8929 Other chronic pain: Secondary | ICD-10-CM

## 2020-03-05 DIAGNOSIS — M4807 Spinal stenosis, lumbosacral region: Secondary | ICD-10-CM | POA: Diagnosis not present

## 2020-03-05 DIAGNOSIS — M5441 Lumbago with sciatica, right side: Secondary | ICD-10-CM

## 2020-03-05 MED ORDER — PREGABALIN 50 MG PO CAPS: 50.0000 mg | ORAL_CAPSULE | Freq: Three times a day (TID) | ORAL | 0 refills | Status: DC

## 2020-03-05 MED ORDER — DICLOFENAC POTASSIUM 50 MG PO TABS: 50.0000 mg | ORAL_TABLET | Freq: Three times a day (TID) | ORAL | 3 refills | Status: DC

## 2020-03-05 MED ORDER — PREGABALIN 50 MG PO CAPS
50.0000 mg | ORAL_CAPSULE | Freq: Three times a day (TID) | ORAL | 0 refills | Status: DC
Start: 2020-03-05 — End: 2020-04-11

## 2020-03-05 NOTE — Patient Instructions (Signed)
Avoid frequent bending and stooping  No lifting greater than 10 lbs. May use ice or moist heat for pain. Weight loss is of benefit. Best medication for lumbar disc disease is arthritis medications like motrin, celebrex and naprosyn. Exercise is important to improve your indurance and does allow people to function better inspite of back pain.  MRI of the lumbar spine to evaluate for worsening sciatica and in ability to word due to pain with sitting and standing  Relieved only with lying down.

## 2020-03-05 NOTE — Progress Notes (Signed)
Office Visit Note   Patient: Kelly Mendez           Date of Birth: [birth year]           MRN: [id number] Visit Date: 03/05/2020              Requested by: [name], NP-C [place],  [place] [id number] PCP: [name], NP-C   Assessment & Plan: Visit Diagnoses:  1. Degenerative disc disease, lumbar   2. Spinal stenosis of lumbosacral region   3. Chronic right-sided low back pain with right-sided sciatica   44 year old female with worsening lumbago with bilateral right greater than left sciatica and difficulty with sitting and  Standing and walking. 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 She is concerned about tramadol and possible withdrawal symptoms having  Stopped this medication. As she was previously working she was not a great candidate for surgical intervention for Single level disc pathology that is not compressive on MRI. As pain and sciatica is worsened and she has neural tension Signs I would recommend repeating the MRI to assess for possible evolving dis herniation versus worsening mechanical  Disc pain with bilateral subarticular stenosis. I am considering intervention as she has seen worsening of her functional Level to where she is unable to work. Prior to her inability to work the risks of intervention were concerning as to whether She would be able to return to gainful employment. Out of work the benefit of intervention outweights the risks associated With intervention. Will try lyrica and cataflam. Discontinue tramadol.   Plan: Avoid frequent bending and stooping  No lifting greater than 10 lbs. May use ice or moist heat for pain. Weight loss is of benefit. Best medication for lumbar disc disease is arthritis medications like motrin, celebrex and naprosyn. Exercise is important to improve your indurance and does allow people to function  better inspite of back pain.  MRI of the lumbar spine to assess for worsening disc degeneration and subarticular stenosis L5-S1 assessing for  Bone changes on either side of the disc at L5-S1.    Follow-Up Instructions: No follow-ups on file.   Orders:  No orders of the defined types were placed in this encounter.  No orders of the defined types were placed in this encounter.     Procedures: No procedures performed   Clinical Data: Findings:  Narrative & Impression CLINICAL DATA:  Lower back pain, history of breast cancer  EXAM: MRI LUMBAR SPINE WITHOUT CONTRAST  TECHNIQUE: Multiplanar, multisequence MR imaging of the lumbar spine was performed. No intravenous contrast was administered.  COMPARISON:  None.  FINDINGS: Segmentation: There are 5 non-rib bearing lumbar type vertebral bodies with the last intervertebral disc space labeled as L5-S1.  Alignment:  There is a minimal retrolisthesis of L5 on S1.  Vertebrae: The vertebral body heights are well maintained. No fracture, marrow edema,or pathologic marrow infiltration.  Conus medullaris and cauda equina: Conus extends to the L1 level. Conus and cauda equina appear normal.  Paraspinal and other soft tissues: The paraspinal soft tissues and visualized retroperitoneal structures are unremarkable. The sacroiliac joints are intact. There is a T2 bright lesion seen within the upper pole the right kidney.  Disc levels:  T12-L1:  No significant canal or neural foraminal narrowing.  L1-L2:   No significant canal or neural foraminal narrowing.  L2-L3:  No significant canal or neural foraminal narrowing.  L3-L4: There is a broad-based disc bulge with ligamentum flavum hypertrophy which causes mild bilateral neural foraminal narrowing.  L4-L5: There is a broad-based disc bulge with ligamentum flavum hypertrophy which causes mild bilateral neural foraminal narrowing and mild central canal  stenosis.  L5-S1: There is a broad-based disc bulge with a central disc protrusion which contacts the bilateral descending S1 nerve roots without impingement. There is ligamentum flavum hypertrophy and facet arthrosis which causes moderate bilateral neural foraminal narrowing.  IMPRESSION: Lumbar spine spondylosis most notable at L5-S1 with a Barradas central disc protrusion which contacts bilateral descending S1 nerve roots without impingement. There is moderate bilateral neural foraminal narrowing.   Electronically Signed   By: Prudencio Pair M.D.   On: 07/24/2019 02:31       Subjective: Chief Complaint  Patient presents with  . Lower Back - Follow-up    44 year old female with history of back pain and sciatica, last seen July. She has been able to work and maintain on medications of tramadol and diclofenac 75 mg BID. Diclofenac was not renewed and tramadol was not helping. I have indicated to Kelly Mendez that I am Concerned about her need for tramadol and that this medication is considered a controlled substance and may lead to developing a  Tolerance of the medication or addiction. She is here today with her mother who has had previous lumbar condition and a long period of Pain before surgery. Kelly Mendez has been able to work up until last Tuesday 02/28/2020 when her back pain became severe and she reports that she is having to lie in bed to relieve the pain that is central and lumbosacral with radiation right posterior thigh and calf into the right heel and left posterior thigh and calf.. Right sided pain is greater than the left. She has with her disability paperwork she needs filled out today. No bowel or bladder dysfunction. Pain is worsened with prolong greater than 5-10 minutes of sitting and with bending and stooping. She Has been to physical therapy and it was unable to relieve her pain. Her MRI shows primarily L5-S1 degenerative disc changes with central disc bulge that  narrows the subarticular zone bilaterally without definite nerve conpression. She is frustrated that the pain is now to where is Not able to work but also she is concerned that stopping her medication that she may be having symptoms of withdrawal with headaches and and nausea. I explained that I do not treat patients for addiction and if that is a concern going to behavioral health to be seena dn started On a methadone program would be indicated. Her mother indicates that we should wean her off the mediction. I don't think that is necessary She has been unable to work since last Tuesday. Her pain is worsened with worsening sciatica into the right leg and thigh greater than the left side.        Review of Systems  Constitutional: Positive for activity change and unexpected weight change. Negative for appetite change, chills, diaphoresis, fatigue and fever.  HENT: Negative.  Negative for congestion, dental problem, drooling, ear discharge, ear pain, facial swelling, hearing loss, mouth sores, nosebleeds, postnasal drip, rhinorrhea, sinus pressure, sinus pain, sneezing, sore throat, tinnitus, trouble swallowing and voice change.   Eyes: Negative.   Respiratory: Negative.   Cardiovascular: Negative.   Endocrine: Negative.   Genitourinary: Negative.   Musculoskeletal: Positive for back pain and gait problem. Negative for arthralgias,  joint swelling, myalgias, neck pain and neck stiffness.  Skin: Negative.   Allergic/Immunologic: Negative for environmental allergies, food allergies and immunocompromised state.  Neurological: Positive for weakness and numbness. Negative for dizziness, tremors, seizures, syncope, facial asymmetry, speech difficulty, light-headedness and headaches.  Hematological: Negative for adenopathy. Does not bruise/bleed easily.  Psychiatric/Behavioral: Positive for sleep disturbance. Negative for agitation, behavioral problems, confusion, decreased concentration, dysphoric mood,  hallucinations, self-injury and suicidal ideas. The patient is not nervous/anxious and is not hyperactive.      Objective: Vital Signs: Ht 6\' 2"  (1.88 m)   Wt (!) 320 lb (145.2 kg)   LMP 01/10/2017   BMI 41.09 kg/m   Physical Exam Constitutional:      General: She is not in acute distress.    Appearance: She is obese. She is not ill-appearing, toxic-appearing or diaphoretic.  HENT:     Head: Normocephalic and atraumatic.     Nose: No congestion or rhinorrhea.     Mouth/Throat:     Mouth: Mucous membranes are moist.     Pharynx: Oropharynx is clear.  Eyes:     General:        Right eye: No discharge.        Left eye: No discharge.     Extraocular Movements: Extraocular movements intact.  Pulmonary:     Effort: Pulmonary effort is normal.     Breath sounds: Normal breath sounds.  Abdominal:     Palpations: Abdomen is soft.  Musculoskeletal:        General: Tenderness present.     Cervical back: No rigidity.     Lumbar back: Positive right straight leg raise test and positive left straight leg raise test.  Lymphadenopathy:     Cervical: No cervical adenopathy.  Skin:    Coloration: Skin is not jaundiced or pale.     Findings: No bruising, lesion or rash.  Neurological:     Mental Status: She is alert.  Psychiatric:        Thought Content: Thought content normal.        Judgment: Judgment normal.     Back Exam   Tenderness  The patient is experiencing tenderness in the lumbar.  Range of Motion  Extension:  20 abnormal  Lateral bend right: 30  Lateral bend left: 30  Rotation right: 40  Rotation left: 40   Muscle Strength  Right Quadriceps:  5/5  Left Quadriceps:  5/5  Right Hamstrings:  5/5  Left Hamstrings:  5/5   Tests  Straight leg raise right: positive Straight leg raise left: positive  Reflexes  Patellar: 0/4 Achilles: 0/4 Babinski's sign: normal   Other  Toe walk: abnormal Heel walk: abnormal Sensation: decreased Gait: abnormal    Erythema: no back redness Scars: absent  Comments:  Lying on left side with right leg held extended. SLR positive right leg Pain with extension of left leg at the knee.  Requesting paper work and able to roll onto chest to do paper work  On clip board. Standing she is stooped and has to push off table to extend to upright position.       Specialty Comments:  No specialty comments available.  Imaging: No results found.   PMFS History: Patient Active Problem List   Diagnosis Date Noted  . Night sweats 10/24/2019  . Chronic right-sided low back pain with right-sided sciatica 10/24/2019  . Fatigue 10/24/2019  . Type 2 diabetes mellitus with hyperglycemia, without long-term current use of insulin ([place]) 08/19/2019  .  Hot flashes 08/13/2018  . Snoring 04/15/2018  . Post traumatic stress disorder (PTSD) 02/25/2018  . Anxiety   . Asymptomatic microscopic hematuria 09/29/2017  . Iron deficiency anemia 08/06/2017  . Adnexal mass, RIGHT 12/16/2016  . Menorrhagia 12/16/2016  . Genetic testing 12/15/2016  . Family history of breast cancer   . Family history of ovarian cancer   . Family history of neurofibromatosis   . Acute deep vein thrombosis (DVT) of axillary vein of right upper extremity ([place]) 11/26/2016  . Type 2 diabetes mellitus with diabetic polyneuropathy, without long-term current use of insulin ([place])   . Malignant neoplasm of upper-inner quadrant of right breast in female, estrogen receptor negative ([place]) 10/21/2016  . Low HDL (under 40) 09/16/2016  . ASCVD (arteriosclerotic cardiovascular disease) 09/16/2016  . Light cigarette smoker 09/15/2016  . Morbid obesity ([place]) 09/15/2016  . Mild intermittent asthma 09/15/2016   Past Medical History:  Diagnosis Date  . Abnormal Pap smear of cervix   . Anxiety   . Asthma   . Asymptomatic microscopic hematuria 09/29/2017  . 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 Concussion    around age 66  .  Diabetes type 2, uncontrolled ([place])    new diagnosis in 08/2016  . DVT (deep venous thrombosis) ([place]) 11/24/2016   in rt arm  . Family history of breast cancer   . Family history of neurofibromatosis   . Family history of ovarian cancer   . History of radiation therapy 04/21/17- 05/20/17   Right Breast 40.05 Gy in 15 fractions followed by a 10 Gy boost in 5 fractions to yield a total dose of 50.05 Gy  . HPV in female   . Hyperlipemia   . Malignant neoplasm of upper-inner quadrant of right breast in female, estrogen receptor negative ([place]) 10/21/2016  . Neuromuscular disorder (HCC)    neuropathy  . Pelvic mass in female 06/16/2017  . Personal history of chemotherapy 2018  . Personal history of radiation therapy 2018  . Pneumonia    2014   . PTSD (post-traumatic stress disorder)     Family History  Problem Relation Age of Onset  . Breast cancer Mother 30  . Hepatitis C Father   . Ovarian cancer Maternal Aunt        dx in her 60s  . Neurofibromatosis Maternal Uncle   . Brain cancer Maternal Uncle 58  . Lung cancer Maternal Grandfather   . Kidney failure Paternal Grandmother   . Heart attack Paternal Grandfather   . Ovarian cancer Maternal Aunt        dx in her 49s  . Neurofibromatosis Maternal Aunt   . Ovarian cancer Maternal Aunt   . Neurofibromatosis Maternal Aunt   . Cervical cancer Maternal Aunt   . Neurofibromatosis Maternal Aunt   . Cancer Maternal Aunt        cancer on the bottom of her foot  . Breast cancer Other        MGF's sisters  . Neurofibromatosis Other        MGM's paternal aunt    Past Surgical History:  Procedure Laterality Date  . BREAST LUMPECTOMY Right 03/18/2017  . BREAST LUMPECTOMY WITH RADIOACTIVE SEED AND SENTINEL LYMPH NODE BIOPSY Right 03/18/2017   Procedure: RIGHT BREAST LUMPECTOMY WITH RADIOACTIVE SEED AND RIGHT SENTINEL LYMPH NODE BIOPSY;  Surgeon: [name], MD;  Location: [facility];  Service: General;  Laterality: Right;    .  COLPOSCOPY    . left ovary removed  1978   was told her ovary was removed but Dr. Denman George found that was not the case. Suspect this was originally an ovarian cystectomy  . PORTACATH PLACEMENT Right 10/29/2016   Procedure: INSERTION PORT-A-CATH WITH [place];  Surgeon: [name], MD;  Location: [facility];  Service: General;  Laterality: Right;  . portacath removal     oct. 5 2018  . ROBOTIC ASSISTED TOTAL HYSTERECTOMY Bilateral 06/16/2017   Procedure: XI ROBOTIC ASSISTED TOTAL HYSTERECTOMY WITH BILATERAL SALPINGECTOMY,  OOPHERECTOMY;  Surgeon: [name], MD;  Location: [facility];  Service: Gynecology;  Laterality: Bilateral;   Social History   Occupational History  . Not on file  Tobacco Use  . Smoking status: Former Smoker    Packs/day: 0.15    Years: 25.00    Pack years: 3.75    Types: Cigarettes    Quit date: 11/25/2016    Years since quitting: 3.2  . Smokeless tobacco: Never Used  Vaping Use  . Vaping Use: Never used  Substance and Sexual Activity  . Alcohol use: Yes    Alcohol/week: 1.0 standard drink    Types: 1 Glasses of wine per week    Comment: rarely   . Drug use: No  . Sexual activity: Not Currently    Birth control/protection: Surgical    Comment: husband vasectomy

## 2020-03-06 ENCOUNTER — Telehealth: Payer: Self-pay | Admitting: Physical Medicine and Rehabilitation

## 2020-03-06 NOTE — Telephone Encounter (Signed)
Received call with [facility] needing a call back for per auth for injection procedure code 62323  Ref# [id number]    The fax # is [phone]  The ph# is [phone]

## 2020-03-08 ENCOUNTER — Ambulatory Visit (HOSPITAL_COMMUNITY)
Admission: EM | Admit: 2020-03-08 | Discharge: 2020-03-08 | Disposition: A | Discharge status: Home / Self Care | Payer: 59 | Attending: Psychiatry | Admitting: Psychiatry

## 2020-03-08 ENCOUNTER — Telehealth: Payer: Self-pay | Admitting: Physical Medicine and Rehabilitation

## 2020-03-08 ENCOUNTER — Other Ambulatory Visit: Payer: Self-pay

## 2020-03-08 DIAGNOSIS — F411 Generalized anxiety disorder: Secondary | ICD-10-CM | POA: Diagnosis not present

## 2020-03-08 DIAGNOSIS — F4310 Post-traumatic stress disorder, unspecified: Secondary | ICD-10-CM

## 2020-03-08 DIAGNOSIS — F332 Major depressive disorder, recurrent severe without psychotic features: Secondary | ICD-10-CM

## 2020-03-08 MED ORDER — GABAPENTIN 100 MG PO CAPS: 200.0000 mg | ORAL_CAPSULE | Freq: Three times a day (TID) | ORAL | 0 refills | Status: DC

## 2020-03-08 MED ORDER — VENLAFAXINE HCL ER 37.5 MG PO CP24
37.5000 mg | ORAL_CAPSULE | Freq: Every day | ORAL | 0 refills | Status: DC
Start: 2020-03-08 — End: 2020-04-11

## 2020-03-08 MED ORDER — HYDROXYZINE PAMOATE 25 MG PO CAPS: 25.0000 mg | ORAL_CAPSULE | Freq: Three times a day (TID) | ORAL | 0 refills | Status: DC | PRN

## 2020-03-08 MED ORDER — DULOXETINE HCL 30 MG PO CPEP: 30.0000 mg | ORAL_CAPSULE | Freq: Every day | ORAL | 0 refills | Status: DC

## 2020-03-08 NOTE — Telephone Encounter (Signed)
Documented in referral and called patient and left message #1.

## 2020-03-08 NOTE — ED Provider Notes (Signed)
Behavioral Health Medical Screening Exam  Kelly Mendez is a 44 y.o. female.  Patient presents voluntarily to the [facility] as a walk-in reporting increased anxiety and depression.  Patient denies having any suicidal or homicidal ideations and denies any hallucinations.  Patient reports that she is going to a pain management and her doctor was concerned about her being addicted to tramadol and it was discontinued.  She reports that she was taking 2 pills four times a day.  She reports that she has degenerative disc disease.  She states that she has not had any of her tramadol since Friday of last week.  She reports that she felt she did go through some withdrawal symptoms but feels that they have pretty much passed by now.  She reports that she does continue to go to an orthopedic doctor as well as a pain management.  She reports that she is on metformin 1000 mg twice daily, Lipitor 20 mg daily, Effexor XR 150 mg daily, diclofenac twice a day unknown dose Trulicity once a week and gabapentin 100 mg p.o. 3 times daily.  She states that the Effexor was started on her for hot flashes but does not feel that is been doing anything for her.  She stated that she had tried a taper off of it in the past on her own but started having withdrawals and went back on it. Consulted with Dr. Robina Ade about patient's medications to assist with her pain, depression, and anxiety.  Discussed starting patient on Cymbalta and came up with the plan to have patient to taper off of her Effexor of 75 mg daily x 14 days, then go to 37.5 mg daily then stop.  When she starts to 37.5 mg daily the patient can start Cymbalta 30 mg p.o. daily.  Also agreed to increase her Neurontin to 200 mg p.o. 3 times daily to assist with anxiety as well as some of her pain.  Patient continued to deny any suicidal homicidal ideations and denies any hallucinations.  Patient stated that she feels safe at home.  Patient had prescriptions E prescribed to  pharmacy of choice.  At this time the patient does not meet inpatient psychiatric treatment criteria and is psychiatric cleared.  Total Time spent with patient: 1 hour  Psychiatric Specialty Exam  Presentation  General Appearance:Appropriate for Environment;Casual  Eye Contact:Good  Speech:Clear and Coherent;Normal Rate  Speech Volume:Normal  Handedness:Right   Mood and Affect  Mood:Anxious;Depressed  Affect:Congruent   Thought Process  Thought Processes:Coherent  Descriptions of Associations:Intact  Orientation:Full (Time, Place and Person)  Thought Content:WDL  Hallucinations:None  Ideas of Reference:None  Suicidal Thoughts:No  Homicidal Thoughts:No   Sensorium  Memory:Immediate Good;Recent Good;Remote Good  Judgment:Good  Insight:Good   Executive Functions  Concentration:Good  Attention Span:Good  Recall:Good  Fund of Knowledge:Good  Language:Good   Psychomotor Activity  Psychomotor Activity:Normal   Assets  Assets:Communication Skills;Desire for Improvement;Financial Resources/Insurance;Housing;Social Support;Transportation   Sleep  Sleep:Fair  Number of hours: No data recorded  Physical Exam: Physical Exam Vitals and nursing note reviewed.  Constitutional:      Appearance: She is well-developed.  Cardiovascular:     Rate and Rhythm: Normal rate.  Pulmonary:     Effort: Pulmonary effort is normal.  Musculoskeletal:        General: Normal range of motion.  Skin:    General: Skin is warm.  Neurological:     Mental Status: She is alert and oriented to person, place, and time.  Psychiatric:        Mood and Affect: Mood is anxious and depressed.    Review of Systems  Constitutional: Negative.   HENT: Negative.   Eyes: Negative.   Respiratory: Negative.   Cardiovascular: Negative.   Gastrointestinal: Negative.   Genitourinary: Negative.   Musculoskeletal: Negative.   Skin: Negative.   Neurological: Negative.    Endo/Heme/Allergies: Negative.   Psychiatric/Behavioral: Positive for depression. Negative for suicidal ideas. The patient is nervous/anxious.    Blood pressure (!) 151/101, pulse 95, temperature 98.3 F (36.8 C), temperature source Oral, resp. rate 18, height 6\' 2"  (1.88 m), weight (!) 311 lb (141.1 kg), last menstrual period 01/10/2017, SpO2 100 %. Body mass index is 39.93 kg/m.  Musculoskeletal: Strength & Muscle Tone: within normal limits Gait & Station: unsteady, uses a cane due to back pain Patient leans: N/A   Recommendations:  Based on my evaluation the patient does not appear to have an emergency medical condition.  Rodriguez Camp, FNP 03/08/2020, 10:34 AM

## 2020-03-08 NOTE — Discharge Instructions (Signed)
Take medications as prescribed Keep scheduled appointments
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
## 2020-03-08 NOTE — ED Notes (Signed)
Belongings in locker 31

## 2020-03-08 NOTE — Telephone Encounter (Signed)
Customer service rep. Almyra Free from [facility] called for approve of injection of patient. Injection code is [phone]. Reference number for patient approval is [id number]. Approval dates for injection is 03/05/20 to 05/2020. If need to contact Almyra Free at [facility] phone number is [phone] enter ref # [id number].

## 2020-03-09 ENCOUNTER — Other Ambulatory Visit: Payer: Self-pay | Admitting: Specialist

## 2020-03-13 ENCOUNTER — Other Ambulatory Visit: Payer: Self-pay | Admitting: Family Medicine

## 2020-03-14 ENCOUNTER — Ambulatory Visit
Admission: RE | Admit: 2020-03-14 | Discharge: 2020-03-14 | Disposition: A | Discharge status: Home / Self Care | Payer: 59 | Source: Ambulatory Visit | Attending: Specialist | Admitting: Specialist

## 2020-03-14 ENCOUNTER — Other Ambulatory Visit: Payer: Self-pay

## 2020-03-14 DIAGNOSIS — M5136 Other intervertebral disc degeneration, lumbar region: Secondary | ICD-10-CM

## 2020-03-14 DIAGNOSIS — M4807 Spinal stenosis, lumbosacral region: Secondary | ICD-10-CM

## 2020-03-14 DIAGNOSIS — G8929 Other chronic pain: Secondary | ICD-10-CM

## 2020-03-21 ENCOUNTER — Telehealth: Payer: Self-pay | Admitting: Physical Medicine and Rehabilitation

## 2020-03-21 ENCOUNTER — Ambulatory Visit: Payer: 59 | Admitting: Specialist

## 2020-03-21 NOTE — Telephone Encounter (Signed)
Pt called stating she missed a call and would still like to get scheduled  [phone]

## 2020-03-23 ENCOUNTER — Other Ambulatory Visit: Payer: Self-pay | Admitting: Specialist

## 2020-03-23 NOTE — Telephone Encounter (Signed)
Called pt and sch her for 04/09/20.

## 2020-03-23 NOTE — Telephone Encounter (Signed)
JN 

## 2020-03-28 ENCOUNTER — Ambulatory Visit (INDEPENDENT_AMBULATORY_CARE_PROVIDER_SITE_OTHER): Payer: 59 | Admitting: Specialist

## 2020-03-28 ENCOUNTER — Encounter: Payer: Self-pay | Admitting: Specialist

## 2020-03-28 ENCOUNTER — Telehealth: Payer: Self-pay | Admitting: Specialist

## 2020-03-28 VITALS — BP 132/94 | HR 106 | Ht 74.0 in | Wt 320.0 lb

## 2020-03-28 DIAGNOSIS — M5116 Intervertebral disc disorders with radiculopathy, lumbar region: Secondary | ICD-10-CM

## 2020-03-28 DIAGNOSIS — M5136 Other intervertebral disc degeneration, lumbar region: Secondary | ICD-10-CM | POA: Diagnosis not present

## 2020-03-28 NOTE — Telephone Encounter (Signed)
I called and spoke with patient she states that she saw on her AVS to take hydrocodone, she don't have an rx for it, she was asking if one should be sent in for her or was this an accident? Also, her insurance has let her know that she can get help at home with ADL's but a pre-auth has to be sent to her insurance company and that would be done by the [facility] if this is order. Can we do this or should her PCP do it? She says can call them if she needs too.-----Please advise

## 2020-03-28 NOTE — Telephone Encounter (Signed)
Pt called stating she had a question about something Dr.Nitka put in her chart and she would like a CB  [phone]

## 2020-03-28 NOTE — Progress Notes (Signed)
Office Visit Note   Patient: Kelly Mendez           Date of Birth: 1975-08-23           MRN: [id number] Visit Date: 03/28/2020              Requested by: [name], NP-C [place],  [place] [id number] PCP: [name], NP-C   Assessment & Plan: Visit Diagnoses:  1. Lumbar disc herniation with radiculopathy   2. Degenerative disc disease, lumbar   44 year old female with progressive disc herniation with worsening functional levels. She has diabetes that is undercontrol but is not able to work or sit with periods greater that one hour. She is taking gabapentin, baclofen and diclofenac but pain is severe and she has to go to bed rest after even a short period on her feet. MRI with progressive disc herniation with bilateral S1 nerve compression. She is failing conservative management of over 8 months and at this point I recommend microdiscectomy. In the future she may require lumbar fusion but most of her pain Is sciatica and related to nerve compression. Risk of recurrence is 5-10 percent with discectomy.   Plan: Avoid bending, stooping and avoid lifting weights greater than 10 lbs. Avoid prolong standing and walking. Order for a new walker with wheels. Surgery scheduling secretary Kandice Hams, will call you in the next week to schedule for surgery.  Surgery recommended is a one level L5-S35microdiscectomy this would be done with microscope. Take hydrocodone for for pain. Risk of surgery includes risk of infection 1 in 200 patients, bleeding 1/2% chance you would need a transfusion.   Risk to the nerves is one in 10,000.  Expect improved walking and standing tolerance. Expect relief of leg pain but numbness may persist depending on the length and degree of pressure that has been present.     Follow-Up Instructions: Return in about 4 weeks (around 04/25/2020).   Orders:  No orders of the defined types were placed in this encounter.  No  orders of the defined types were placed in this encounter.     Procedures: No procedures performed   Clinical Data: Findings:  Narrative & Impression CLINICAL DATA:  Low back pain radiating into the buttocks and posterior thighs, right greater than left.  EXAM: MRI LUMBAR SPINE WITHOUT CONTRAST  TECHNIQUE: Multiplanar, multisequence MR imaging of the lumbar spine was performed. No intravenous contrast was administered.  COMPARISON:  07/23/2019  FINDINGS: Segmentation:  Standard.  Alignment:  Unchanged trace retrolisthesis of L5 on S1.  Vertebrae: No fracture, suspicious osseous lesion, or significant marrow edema.  Conus medullaris and cauda equina: Conus extends to the T12-L1 level. Conus and cauda equina appear normal.  Paraspinal and other soft tissues: Unchanged 1.8 cm T2 hyperintense lesion in the upper pole of the right kidney compatible with a cyst.  Disc levels:  T12-L1 through L3-4: Negative.  L4-5: Minimal disc bulging without stenosis, unchanged.  L5-S1: A broad posterior disc protrusion has enlarged and together with mild facet hypertrophy result in increased moderate bilateral neural foraminal stenosis potentially affecting the S1 nerve roots. Mild disc bulging and endplate spurring contribute to unchanged mild left neural foraminal stenosis. No significant generalized spinal stenosis.  IMPRESSION: Enlargement of a disc protrusion at L5-S1 with increased moderate bilateral neural foraminal stenosis.   Electronically Signed   By: Logan Bores M.D.   On: 03/15/2020 09:13       Subjective: Chief  Complaint  Patient presents with  . Lower Back - Follow-up    MRI Review --Lumbar    44 year old female with history of disc degeneration L5-S1 with protrusion that was mild. She had Initial onset in December 2020, awoke with pain. Persistent pain in the back and sciatica right posterior buttock to right posterior thigh calf and  heel right foot. Also pain in the left leg to the level of the knee. She has tried lying on the left side but winds up on front or back with pain. 2 days of consecutive activity made her go to bed. More of the pain is in the back, the shooting pain into the leg is more consistent and is severe.    Review of Systems  Constitutional: Negative.   HENT: Negative.   Eyes: Negative.   Respiratory: Negative.   Cardiovascular: Negative.   Gastrointestinal: Negative.   Endocrine: Negative.   Genitourinary: Negative.   Musculoskeletal: Negative.   Skin: Negative.   Allergic/Immunologic: Negative.   Neurological: Negative.   Hematological: Negative.   Psychiatric/Behavioral: Negative.      Objective: Vital Signs: BP (!) 132/94 (BP Location: Left Arm, Patient Position: Sitting)   Pulse (!) 106   Ht 6\' 2"  (1.88 m)   Wt (!) 320 lb (145.2 kg)   LMP 01/10/2017   BMI 41.09 kg/m   Physical Exam Constitutional:      Appearance: She is well-developed.  HENT:     Head: Normocephalic and atraumatic.  Eyes:     Pupils: Pupils are equal, round, and reactive to light.  Pulmonary:     Effort: Pulmonary effort is normal.     Breath sounds: Normal breath sounds.  Abdominal:     General: Bowel sounds are normal.     Palpations: Abdomen is soft.  Musculoskeletal:     Cervical back: Normal range of motion and neck supple.  Skin:    General: Skin is warm and dry.  Neurological:     Mental Status: She is alert and oriented to person, place, and time.  Psychiatric:        Behavior: Behavior normal.        Thought Content: Thought content normal.        Judgment: Judgment normal.     Back Exam   Tenderness  The patient is experiencing tenderness in the lumbar.  Range of Motion  Extension: abnormal  Flexion: abnormal  Lateral bend right: abnormal  Lateral bend left: abnormal  Rotation right: abnormal  Rotation left: abnormal   Muscle Strength  Right Quadriceps:  5/5  Left  Quadriceps:  5/5  Right Hamstrings:  5/5  Left Hamstrings:  5/5   Reflexes  Patellar: 2/4 Achilles: 0/4  Other  Toe walk: abnormal Heel walk: abnormal Gait: antalgic   Comments:  SLR is painful right greater than left.       Specialty Comments:  No specialty comments available.  Imaging: No results found.   PMFS History: Patient Active Problem List   Diagnosis Date Noted  . Generalized anxiety disorder   . Night sweats 10/24/2019  . Chronic right-sided low back pain with right-sided sciatica 10/24/2019  . Fatigue 10/24/2019  . Type 2 diabetes mellitus with hyperglycemia, without long-term current use of insulin ([place]) 08/19/2019  . Hot flashes 08/13/2018  . Snoring 04/15/2018  . Post traumatic stress disorder (PTSD) 02/25/2018  . Anxiety   . Asymptomatic microscopic hematuria 09/29/2017  . Iron deficiency anemia 08/06/2017  . Adnexal mass, RIGHT  12/16/2016  . Menorrhagia 12/16/2016  . Genetic testing 12/15/2016  . Family history of breast cancer   . Family history of ovarian cancer   . Family history of neurofibromatosis   . Acute deep vein thrombosis (DVT) of axillary vein of right upper extremity ([place]) 11/26/2016  . Type 2 diabetes mellitus with diabetic polyneuropathy, without long-term current use of insulin ([place])   . Malignant neoplasm of upper-inner quadrant of right breast in female, estrogen receptor negative ([place]) 10/21/2016  . Low HDL (under 40) 09/16/2016  . ASCVD (arteriosclerotic cardiovascular disease) 09/16/2016  . Light cigarette smoker 09/15/2016  . Morbid obesity ([place]) 09/15/2016  . Mild intermittent asthma 09/15/2016   Past Medical History:  Diagnosis Date  . Abnormal Pap smear of cervix   . Anxiety   . Asthma   . Asymptomatic microscopic hematuria 09/29/2017  . 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 Concussion    around age 19  . Diabetes type 2, uncontrolled ([place])    new diagnosis in 08/2016  . DVT  (deep venous thrombosis) ([place]) 11/24/2016   in rt arm  . Family history of breast cancer   . Family history of neurofibromatosis   . Family history of ovarian cancer   . History of radiation therapy 04/21/17- 05/20/17   Right Breast 40.05 Gy in 15 fractions followed by a 10 Gy boost in 5 fractions to yield a total dose of 50.05 Gy  . HPV in female   . Hyperlipemia   . Malignant neoplasm of upper-inner quadrant of right breast in female, estrogen receptor negative ([place]) 10/21/2016  . Neuromuscular disorder (HCC)    neuropathy  . Pelvic mass in female 06/16/2017  . Personal history of chemotherapy 2018  . Personal history of radiation therapy 2018  . Pneumonia    2014   . PTSD (post-traumatic stress disorder)     Family History  Problem Relation Age of Onset  . Breast cancer Mother 91  . Hepatitis C Father   . Ovarian cancer Maternal Aunt        dx in her 75s  . Neurofibromatosis Maternal Uncle   . Brain cancer Maternal Uncle 58  . Lung cancer Maternal Grandfather   . Kidney failure Paternal Grandmother   . Heart attack Paternal Grandfather   . Ovarian cancer Maternal Aunt        dx in her 53s  . Neurofibromatosis Maternal Aunt   . Ovarian cancer Maternal Aunt   . Neurofibromatosis Maternal Aunt   . Cervical cancer Maternal Aunt   . Neurofibromatosis Maternal Aunt   . Cancer Maternal Aunt        cancer on the bottom of her foot  . Breast cancer Other        MGF's sisters  . Neurofibromatosis Other        MGM's paternal aunt    Past Surgical History:  Procedure Laterality Date  . BREAST LUMPECTOMY Right 03/18/2017  . BREAST LUMPECTOMY WITH RADIOACTIVE SEED AND SENTINEL LYMPH NODE BIOPSY Right 03/18/2017   Procedure: RIGHT BREAST LUMPECTOMY WITH RADIOACTIVE SEED AND RIGHT SENTINEL LYMPH NODE BIOPSY;  Surgeon: [name], MD;  Location: [facility];  Service: General;  Laterality: Right;  . COLPOSCOPY    . left ovary removed  1978   was told her ovary was  removed but Dr. Denman George found that was not the case. Suspect this was originally an ovarian cystectomy  .  PORTACATH PLACEMENT Right 10/29/2016   Procedure: INSERTION PORT-A-CATH WITH [place];  Surgeon: [name], MD;  Location: [facility];  Service: General;  Laterality: Right;  . portacath removal     oct. 5 2018  . ROBOTIC ASSISTED TOTAL HYSTERECTOMY Bilateral 06/16/2017   Procedure: XI ROBOTIC ASSISTED TOTAL HYSTERECTOMY WITH BILATERAL SALPINGECTOMY,  OOPHERECTOMY;  Surgeon: [name], MD;  Location: [facility];  Service: Gynecology;  Laterality: Bilateral;   Social History   Occupational History  . Not on file  Tobacco Use  . Smoking status: Former Smoker    Packs/day: 0.15    Years: 25.00    Pack years: 3.75    Types: Cigarettes    Quit date: 11/25/2016    Years since quitting: 3.3  . Smokeless tobacco: Never Used  Vaping Use  . Vaping Use: Never used  Substance and Sexual Activity  . Alcohol use: Yes    Alcohol/week: 1.0 standard drink    Types: 1 Glasses of wine per week    Comment: rarely   . Drug use: No  . Sexual activity: Not Currently    Birth control/protection: Surgical    Comment: husband vasectomy

## 2020-03-28 NOTE — Patient Instructions (Signed)
Avoid bending, stooping and avoid lifting weights greater than 10 lbs. Avoid prolong standing and walking. Order for a new walker with wheels. Surgery scheduling secretary Kandice Hams, will call you in the next week to schedule for surgery.  Surgery recommended is a one level L5-S29microdiscectomy this would be done with microscope. Take hydrocodone for for pain. Risk of surgery includes risk of infection 1 in 200 patients, bleeding 1/2% chance you would need a transfusion.   Risk to the nerves is one in 10,000.  Expect improved walking and standing tolerance. Expect relief of leg pain but numbness may persist depending on the length and degree of pressure that has been present.

## 2020-03-30 NOTE — Telephone Encounter (Signed)
Lmom advising patient of message from Dr. Louanne Skye

## 2020-03-30 NOTE — Telephone Encounter (Signed)
That is a part of the preprinted instructions to patients with acute spine injury. I am recommending that she stay off narcotics if she can as they are addictive and don not change the long term outcome of your back condition.

## 2020-04-09 ENCOUNTER — Encounter: Payer: Self-pay | Admitting: Specialist

## 2020-04-09 ENCOUNTER — Ambulatory Visit: Payer: 59 | Admitting: Physical Medicine and Rehabilitation

## 2020-04-11 ENCOUNTER — Other Ambulatory Visit: Payer: Self-pay

## 2020-04-11 ENCOUNTER — Encounter (HOSPITAL_COMMUNITY): Payer: Self-pay | Admitting: Psychiatry

## 2020-04-11 ENCOUNTER — Ambulatory Visit (INDEPENDENT_AMBULATORY_CARE_PROVIDER_SITE_OTHER): Payer: 59 | Admitting: Psychiatry

## 2020-04-11 DIAGNOSIS — F900 Attention-deficit hyperactivity disorder, predominantly inattentive type: Secondary | ICD-10-CM | POA: Insufficient documentation

## 2020-04-11 DIAGNOSIS — F411 Generalized anxiety disorder: Secondary | ICD-10-CM | POA: Diagnosis not present

## 2020-04-11 DIAGNOSIS — F331 Major depressive disorder, recurrent, moderate: Secondary | ICD-10-CM

## 2020-04-11 MED ORDER — DULOXETINE HCL 60 MG PO CPEP: 60.0000 mg | ORAL_CAPSULE | Freq: Every day | ORAL | 0 refills | Status: DC

## 2020-04-11 MED ORDER — ATOMOXETINE HCL 40 MG PO CAPS: 40.0000 mg | ORAL_CAPSULE | Freq: Every day | ORAL | 2 refills | Status: DC

## 2020-04-11 MED ORDER — HYDROXYZINE PAMOATE 25 MG PO CAPS: 25.0000 mg | ORAL_CAPSULE | Freq: Three times a day (TID) | ORAL | 0 refills | Status: DC | PRN

## 2020-04-11 MED ORDER — TRAZODONE HCL 50 MG PO TABS: 50.0000 mg | ORAL_TABLET | Freq: Every day | ORAL | 2 refills | Status: DC

## 2020-04-11 MED ORDER — GABAPENTIN 100 MG PO CAPS: 200.0000 mg | ORAL_CAPSULE | Freq: Three times a day (TID) | ORAL | 2 refills | Status: DC
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
## 2020-04-26 ENCOUNTER — Encounter: Payer: Self-pay | Admitting: Surgery

## 2020-04-26 ENCOUNTER — Ambulatory Visit (INDEPENDENT_AMBULATORY_CARE_PROVIDER_SITE_OTHER): Payer: 59 | Admitting: Surgery

## 2020-04-26 VITALS — BP 131/88 | HR 97

## 2020-04-26 DIAGNOSIS — M5116 Intervertebral disc disorders with radiculopathy, lumbar region: Secondary | ICD-10-CM

## 2020-04-26 NOTE — Progress Notes (Signed)
44 year old female with history of bilateral L5-S1 HNP comes in for preop evaluation.  Symptoms unchanged from previous visit.  She is wanting to proceed with bilateral L5-S1 microdiscectomy as scheduled.  Today history and physical performed.  Review of systems negative.  I have reviewed patient's history.  No clearances were requested.  Surgical procedure discussed along with potential hospital stay.  All questions answered and she wishes to proceed.

## 2020-04-27 ENCOUNTER — Other Ambulatory Visit: Payer: Self-pay

## 2020-04-27 ENCOUNTER — Ambulatory Visit (INDEPENDENT_AMBULATORY_CARE_PROVIDER_SITE_OTHER): Payer: 59 | Admitting: Family Medicine

## 2020-04-27 ENCOUNTER — Encounter: Payer: Self-pay | Admitting: Family Medicine

## 2020-04-27 VITALS — BP 134/88 | HR 95 | Wt 336.0 lb

## 2020-04-27 DIAGNOSIS — I2510 Atherosclerotic heart disease of native coronary artery without angina pectoris: Secondary | ICD-10-CM | POA: Diagnosis not present

## 2020-04-27 DIAGNOSIS — E118 Type 2 diabetes mellitus with unspecified complications: Secondary | ICD-10-CM | POA: Diagnosis not present

## 2020-04-27 DIAGNOSIS — Z9189 Other specified personal risk factors, not elsewhere classified: Secondary | ICD-10-CM

## 2020-04-27 DIAGNOSIS — Z79899 Other long term (current) drug therapy: Secondary | ICD-10-CM

## 2020-04-27 DIAGNOSIS — I10 Essential (primary) hypertension: Secondary | ICD-10-CM | POA: Diagnosis not present

## 2020-04-27 DIAGNOSIS — E559 Vitamin D deficiency, unspecified: Secondary | ICD-10-CM

## 2020-04-27 DIAGNOSIS — R0681 Apnea, not elsewhere classified: Secondary | ICD-10-CM

## 2020-04-27 DIAGNOSIS — R0683 Snoring: Secondary | ICD-10-CM

## 2020-04-27 LAB — LIPID PANEL

## 2020-04-27 MED ORDER — LOSARTAN POTASSIUM 50 MG PO TABS: 50.0000 mg | ORAL_TABLET | Freq: Every day | ORAL | 1 refills | Status: DC

## 2020-04-27 NOTE — Patient Instructions (Signed)
You can start on the losartan once daily and keep a close eye on your blood pressure.  This medication protect your kidneys because of the diabetes.  Let me know if you have any side effects or questions about it.  I have referred you for a home sleep study.  You will receive a call about this.  I also referred you to [facility] health weight management and they will call you to schedule a visit.  We will be in touch with your lab results.  Good luck with your upcoming surgery.    DASH Eating Plan DASH stands for "Dietary Approaches to Stop Hypertension." The DASH eating plan is a healthy eating plan that has been shown to reduce high blood pressure (hypertension). It may also reduce your risk for type 2 diabetes, heart disease, and stroke. The DASH eating plan may also help with weight loss. What are tips for following this plan?  General guidelines  Avoid eating more than 2,300 mg (milligrams) of salt (sodium) a day. If you have hypertension, you may need to reduce your sodium intake to 1,500 mg a day.  Limit alcohol intake to no more than 1 drink a day for nonpregnant women and 2 drinks a day for men. One drink equals 12 oz of beer, 5 oz of wine, or 1 oz of hard liquor.  Work with your health care provider to maintain a healthy body weight or to lose weight. Ask what an ideal weight is for you.  Get at least 30 minutes of exercise that causes your heart to beat faster (aerobic exercise) most days of the week. Activities may include walking, swimming, or biking.  Work with your health care provider or diet and nutrition specialist (dietitian) to adjust your eating plan to your individual calorie needs. Reading food labels   Check food labels for the amount of sodium per serving. Choose foods with less than 5 percent of the Daily Value of sodium. Generally, foods with less than 300 mg of sodium per serving fit into this eating plan.  To find whole grains, look for the word "whole" as the  first word in the ingredient list. Shopping  Buy products labeled as "low-sodium" or "no salt added."  Buy fresh foods. Avoid canned foods and premade or frozen meals. Cooking  Avoid adding salt when cooking. Use salt-free seasonings or herbs instead of table salt or sea salt. Check with your health care provider or pharmacist before using salt substitutes.  Do not fry foods. Cook foods using healthy methods such as baking, boiling, grilling, and broiling instead.  Cook with heart-healthy oils, such as olive, canola, soybean, or sunflower oil. Meal planning  Eat a balanced diet that includes: ? 5 or more servings of fruits and vegetables each day. At each meal, try to fill half of your plate with fruits and vegetables. ? Up to 6-8 servings of whole grains each day. ? Less than 6 oz of lean meat, poultry, or fish each day. A 3-oz serving of meat is about the same size as a deck of cards. One egg equals 1 oz. ? 2 servings of low-fat dairy each day. ? A serving of nuts, seeds, or beans 5 times each week. ? Heart-healthy fats. Healthy fats called Omega-3 fatty acids are found in foods such as flaxseeds and coldwater fish, like sardines, salmon, and mackerel.  Limit how much you eat of the following: ? Canned or prepackaged foods. ? Food that is high in trans fat, such  as fried foods. ? Food that is high in saturated fat, such as fatty meat. ? Sweets, desserts, sugary drinks, and other foods with added sugar. ? Full-fat dairy products.  Do not salt foods before eating.  Try to eat at least 2 vegetarian meals each week.  Eat more home-cooked food and less restaurant, buffet, and fast food.  When eating at a restaurant, ask that your food be prepared with less salt or no salt, if possible. What foods are recommended? The items listed may not be a complete list. Talk with your dietitian about what dietary choices are best for you. Grains Whole-grain or whole-wheat bread. Whole-grain  or whole-wheat pasta. Brown rice. [place]. Bulgur. Whole-grain and low-sodium cereals. Pita bread. Low-fat, low-sodium crackers. Whole-wheat flour tortillas. Vegetables Fresh or frozen vegetables (raw, steamed, roasted, or grilled). Low-sodium or reduced-sodium tomato and vegetable juice. Low-sodium or reduced-sodium tomato sauce and tomato paste. Low-sodium or reduced-sodium canned vegetables. Fruits All fresh, dried, or frozen fruit. Canned fruit in natural juice (without added sugar). Meat and other protein foods Skinless chicken or [place]. Ground chicken or [place]. Pork with fat trimmed off. Fish and seafood. Egg whites. Dried beans, peas, or lentils. Unsalted nuts, nut butters, and seeds. Unsalted canned beans. Lean cuts of beef with fat trimmed off. Low-sodium, lean deli meat. Dairy Low-fat (1%) or fat-free (skim) milk. Fat-free, low-fat, or reduced-fat cheeses. Nonfat, low-sodium ricotta or cottage cheese. Low-fat or nonfat yogurt. Low-fat, low-sodium cheese. Fats and oils Soft margarine without trans fats. Vegetable oil. Low-fat, reduced-fat, or light mayonnaise and salad dressings (reduced-sodium). Canola, safflower, olive, soybean, and sunflower oils. Avocado. Seasoning and other foods Herbs. Spices. Seasoning mixes without salt. Unsalted popcorn and pretzels. Fat-free sweets. What foods are not recommended? The items listed may not be a complete list. Talk with your dietitian about what dietary choices are best for you. Grains Baked goods made with fat, such as croissants, muffins, or some breads. Dry pasta or rice meal packs. Vegetables Creamed or fried vegetables. Vegetables in a cheese sauce. Regular canned vegetables (not low-sodium or reduced-sodium). Regular canned tomato sauce and paste (not low-sodium or reduced-sodium). Regular tomato and vegetable juice (not low-sodium or reduced-sodium). Angie Fava. Olives. Fruits Canned fruit in a light or heavy syrup. Fried fruit.  Fruit in cream or butter sauce. Meat and other protein foods Fatty cuts of meat. Ribs. Fried meat. Berniece Salines. Sausage. Bologna and other processed lunch meats. Salami. Fatback. Hotdogs. Bratwurst. Salted nuts and seeds. Canned beans with added salt. Canned or smoked fish. Whole eggs or egg yolks. Chicken or [place] with skin. Dairy Whole or 2% milk, cream, and half-and-half. Whole or full-fat cream cheese. Whole-fat or sweetened yogurt. Full-fat cheese. Nondairy creamers. Whipped toppings. Processed cheese and cheese spreads. Fats and oils Butter. Stick margarine. Lard. Shortening. Ghee. Bacon fat. Tropical oils, such as coconut, palm kernel, or palm oil. Seasoning and other foods Salted popcorn and pretzels. Onion salt, garlic salt, seasoned salt, table salt, and sea salt. Worcestershire sauce. Tartar sauce. Barbecue sauce. Teriyaki sauce. Soy sauce, including reduced-sodium. Steak sauce. Canned and packaged gravies. Fish sauce. Oyster sauce. Cocktail sauce. Horseradish that you find on the shelf. Ketchup. Mustard. Meat flavorings and tenderizers. Bouillon cubes. Hot sauce and Tabasco sauce. Premade or packaged marinades. Premade or packaged taco seasonings. Relishes. Regular salad dressings. Where to find more information:  National Heart, Lung, and [place]: https://wilson-eaton.com/  American Heart Association: www.heart.org Summary  The DASH eating plan is a healthy eating plan that has been shown  to reduce high blood pressure (hypertension). It may also reduce your risk for type 2 diabetes, heart disease, and stroke.  With the DASH eating plan, you should limit salt (sodium) intake to 2,300 mg a day. If you have hypertension, you may need to reduce your sodium intake to 1,500 mg a day.  When on the DASH eating plan, aim to eat more fresh fruits and vegetables, whole grains, lean proteins, low-fat dairy, and heart-healthy fats.  Work with your health care provider or diet and nutrition  specialist (dietitian) to adjust your eating plan to your individual calorie needs. This information is not intended to replace advice given to you by your health care provider. Make sure you discuss any questions you have with your health care provider. Document Revised: 06/12/2017 Document Reviewed: 06/23/2016 Elsevier Patient Education  2020 [employer].

## 2020-04-27 NOTE — Progress Notes (Signed)
Printed and gave to patient

## 2020-04-27 NOTE — Progress Notes (Signed)
Established patient visit   Patient: Kelly Mendez   DOB: 1976/06/19   44 y.o. Female  MRN: [id number] Visit Date: 04/27/2020  Today's healthcare provider: Harland Dingwall, NP-C   Chief Complaint  Patient presents with  . Hyperlipidemia   Subjective    HPI  Lipid/Cholesterol, Follow-up  Last lipid panel Other pertinent labs  Lab Results  Component Value Date   CHOL 109 04/27/2020   HDL 30 (L) 04/27/2020   LDLCALC 62 04/27/2020   TRIG 85 04/27/2020   CHOLHDL 3.6 04/27/2020   Lab Results  Component Value Date   ALT 24 04/27/2020   AST 17 04/27/2020   PLT 261 04/27/2020   TSH 3.480 04/27/2020     She was last seen for this 6 months ago.  Management since that visit includes continue current medication.  She reports good compliance with treatment. She is not having side effects.   Symptoms: No chest pain No chest pressure/discomfort  No dyspnea No lower extremity edema  Yes numbness or tingling of extremity No orthopnea  No palpitations No paroxysmal nocturnal dyspnea  No speech difficulty No syncope   Current diet: well balanced Current exercise: walking  The ASCVD Risk score Mikey Bussing DC Jr., et al., 2013) failed to calculate for the following reasons:   The valid total cholesterol range is 130 to 320 mg/dL  --------------------------------------------------------------------------------------------------- Follow up for Vitamin D deficiency  The patient was last seen for this 6 months ago. Changes made at last visit include started on Vit D 50000.  She reports good compliance with treatment.  She is not having side effects.   Vitamin D was 9.5 in 10/2019 -----------------------------------------------------------------------------------------  Patient reports back surgery next Friday,05/04/2020.  States her husband observed her stopping breathing for several seconds while sleeping. States she is snoring more lately. Her weight gain and  these findings increase her risk of having sleep apnea.  She had a HST 2 years ago and it was negative.   She sees endocrinology for her diabetes.   Would like help with weight loss. She was referred to [facility] weight management prior to Covid pandemic and did not go. She would like to be referred again.   Medications: Outpatient Medications Prior to Visit  Medication Sig  . atomoxetine (STRATTERA) 40 MG capsule Take 1 capsule (40 mg total) by mouth daily.  Marland Kitchen atorvastatin (LIPITOR) 20 MG tablet TAKE 1 TABLET (20 MG TOTAL) BY MOUTH DAILY.  . baclofen (LIORESAL) 10 MG tablet Take 0.5-1 tablets (5-10 mg total) by mouth 3 (three) times daily as needed for muscle spasms. (Patient taking differently: Take 10 mg by mouth 2 (two) times daily as needed for muscle spasms. )  . bismuth subsalicylate (PEPTO BISMOL) 262 MG/15ML suspension Take 30 mLs by mouth every 6 (six) hours as needed for indigestion or diarrhea or loose stools.  . Continuous Blood Gluc Sensor (FREESTYLE LIBRE 2 SENSOR) MISC 1 Device by Does not apply route as directed.  . diclofenac (CATAFLAM) 50 MG tablet Take 1 tablet (50 mg total) by mouth 3 (three) times daily.  . Dulaglutide (TRULICITY) 1.5 YC/1.4GY SOPN Inject 0.5 mLs (1.5 mg total) into the skin once a week. (Patient taking differently: Inject 1.5 mg into the skin every Sunday. )  . DULoxetine (CYMBALTA) 60 MG capsule Take 1 capsule (60 mg total) by mouth daily. Start 1 tab daily in 14 days when Effexor is at 1 tab daily  . gabapentin (NEURONTIN) 100 MG capsule Take 2  capsules (200 mg total) by mouth 3 (three) times daily.  . hydrOXYzine (VISTARIL) 25 MG capsule Take 1 capsule (25 mg total) by mouth 3 (three) times daily as needed. (Patient taking differently: Take 25 mg by mouth See admin instructions. Take 25 mg daily, may take another 25 mg dose up to 2 more times as needed for anxiety)  . metFORMIN (GLUCOPHAGE-XR) 500 MG 24 hr tablet Take 2 tablets (1,000 mg total) by mouth daily  with breakfast.  . traZODone (DESYREL) 50 MG tablet Take 1 tablet (50 mg total) by mouth at bedtime. (Patient taking differently: Take 50 mg by mouth at bedtime as needed for sleep. )   No facility-administered medications prior to visit.    Review of Systems Pertinent positives and negatives in the history of present illness.    Objective    BP 134/88 (BP Location: Left Arm, Patient Position: Sitting, Cuff Size: Large)   Pulse 95   Wt (!) 336 lb (152.4 kg)   LMP 01/10/2017   SpO2 98%   BMI 43.14 kg/m    Physical Exam Constitutional:      Appearance: Normal appearance. She is obese.  Skin:    General: Skin is warm and dry.  Neurological:     General: No focal deficit present.     Mental Status: She is alert and oriented to person, place, and time.  Psychiatric:        Mood and Affect: Mood normal.        Behavior: Behavior normal.       Assessment & Plan    Controlled type 2 diabetes mellitus with complication, without long-term current use of insulin (HCC) - Plan: Amb Ref to Medical Weight Management, losartan (COZAAR) 50 MG tablet, CBC with Differential/Platelet, Comprehensive metabolic panel, TSH, T4, free, DISCONTINUED: losartan (COZAAR) 50 MG tablet -managed by endocrinology   Morbid obesity ([place]) - Plan: Amb Ref to Medical Weight Management, TSH, T4, free, Lipid panel Refer to [facility] weight management. She seems motivated to lose weight.   ASCVD (arteriosclerotic cardiovascular disease) - Plan: Amb Ref to Medical Weight Management, Lipid panel Continue statin therapy. Adjust does as appropriate   Elevated blood pressure reading in office with diagnosis of hypertension - Plan: losartan (COZAAR) 50 MG tablet, DISCONTINUED: losartan (COZAAR) 50 MG tablet -continue losartan. She is taking this for DM  At risk for sleep apnea - Plan: Home sleep test -HST ordered. Negative test in 2019 but she had recent witnessed apneic episode.   Snoring - Plan: Home sleep  test  Witnessed episode of apnea - Plan: Home sleep test  Vitamin D deficiency - Plan: VITAMIN D 25 Hydroxy (Vit-D Deficiency, Fractures) -continue supplement. Adjust does as needed   Medication management - Plan: VITAMIN D 25 Hydroxy (Vit-D Deficiency, Fractures), Lipid panel       Harland Dingwall, NP-C  Agency [phone] (phone) [phone] (fax)  [place]

## 2020-04-30 ENCOUNTER — Other Ambulatory Visit: Payer: Self-pay

## 2020-04-30 ENCOUNTER — Ambulatory Visit (INDEPENDENT_AMBULATORY_CARE_PROVIDER_SITE_OTHER): Payer: 59 | Admitting: Licensed Clinical Social Worker

## 2020-04-30 DIAGNOSIS — F411 Generalized anxiety disorder: Secondary | ICD-10-CM | POA: Diagnosis not present

## 2020-04-30 NOTE — Progress Notes (Signed)
Her labs are fine overall but I am still waiting on her vitamin D level which is unusual. Please ask Verdis Frederickson to check on this.

## 2020-05-01 ENCOUNTER — Other Ambulatory Visit (HOSPITAL_COMMUNITY): Payer: 59

## 2020-05-01 ENCOUNTER — Encounter (HOSPITAL_COMMUNITY)
Admission: RE | Admit: 2020-05-01 | Discharge: 2020-05-01 | Disposition: A | Discharge status: Home / Self Care | Payer: 59 | Source: Ambulatory Visit | Attending: Specialist | Admitting: Specialist

## 2020-05-01 ENCOUNTER — Other Ambulatory Visit: Payer: Self-pay

## 2020-05-01 ENCOUNTER — Ambulatory Visit (HOSPITAL_COMMUNITY)
Admission: RE | Admit: 2020-05-01 | Discharge: 2020-05-01 | Disposition: A | Discharge status: Home / Self Care | Payer: 59 | Source: Ambulatory Visit | Attending: Surgery | Admitting: Surgery

## 2020-05-01 ENCOUNTER — Encounter (HOSPITAL_COMMUNITY): Payer: Self-pay

## 2020-05-01 DIAGNOSIS — Z01818 Encounter for other preprocedural examination: Secondary | ICD-10-CM | POA: Diagnosis not present

## 2020-05-01 HISTORY — DX: Essential (primary) hypertension: I10

## 2020-05-01 LAB — CBC
HCT: 43.4 % (ref 36.0–46.0)
Hemoglobin: 13.8 g/dL (ref 12.0–15.0)
MCH: 27 pg (ref 26.0–34.0)
MCHC: 31.8 g/dL (ref 30.0–36.0)
MCV: 84.8 fL (ref 80.0–100.0)
Platelets: 325 10*3/uL (ref 150–400)
RBC: 5.12 MIL/uL — ABNORMAL HIGH (ref 3.87–5.11)
RDW: 13.3 % (ref 11.5–15.5)
WBC: 7.4 10*3/uL (ref 4.0–10.5)
nRBC: 0 % (ref 0.0–0.2)

## 2020-05-01 LAB — COMPREHENSIVE METABOLIC PANEL
ALT: 24 IU/L (ref 0–32)
ALT: 24 U/L (ref 0–44)
AST: 17 IU/L (ref 0–40)
AST: 17 U/L (ref 15–41)
Albumin/Globulin Ratio: 1.9 (ref 1.2–2.2)
Albumin: 4.4 g/dL (ref 3.8–4.8)
Albumin: 3.9 g/dL (ref 3.5–5.0)
Alkaline Phosphatase: 146 IU/L — ABNORMAL HIGH (ref 44–121)
Alkaline Phosphatase: 115 U/L (ref 38–126)
Anion gap: 11 (ref 5–15)
BUN/Creatinine Ratio: 19 (ref 9–23)
BUN: 13 mg/dL (ref 6–24)
BUN: 10 mg/dL (ref 6–20)
Bilirubin Total: 0.3 mg/dL (ref 0.0–1.2)
CO2: 21 mmol/L (ref 20–29)
CO2: 25 mmol/L (ref 22–32)
Calcium: 9.6 mg/dL (ref 8.7–10.2)
Calcium: 9.8 mg/dL (ref 8.9–10.3)
Chloride: 102 mmol/L (ref 96–106)
Chloride: 104 mmol/L (ref 98–111)
Creatinine, Ser: 0.68 mg/dL (ref 0.57–1.00)
Creatinine, Ser: 0.7 mg/dL (ref 0.44–1.00)
GFR calc Af Amer: 124 mL/min/{1.73_m2} (ref >59)
GFR calc non Af Amer: 107 mL/min/{1.73_m2} (ref >59)
GFR, Estimated: >60 mL/min (ref >60)
Globulin, Total: 2.3 g/dL (ref 1.5–4.5)
Glucose, Bld: 122 mg/dL — ABNORMAL HIGH (ref 70–99)
Glucose: 147 mg/dL — ABNORMAL HIGH (ref 65–99)
Potassium: 4.9 mmol/L (ref 3.5–5.2)
Potassium: 4.2 mmol/L (ref 3.5–5.1)
Sodium: 139 mmol/L (ref 134–144)
Sodium: 140 mmol/L (ref 135–145)
Total Bilirubin: 0.5 mg/dL (ref 0.3–1.2)
Total Protein: 6.7 g/dL (ref 6.0–8.5)
Total Protein: 6.9 g/dL (ref 6.5–8.1)

## 2020-05-01 LAB — SURGICAL PCR SCREEN
MRSA, PCR: NEGATIVE
Staphylococcus aureus: NEGATIVE

## 2020-05-01 LAB — CBC WITH DIFFERENTIAL/PLATELET
Basophils Absolute: 0 10*3/uL (ref 0.0–0.2)
Basos: 1 %
EOS (ABSOLUTE): 0.2 10*3/uL (ref 0.0–0.4)
Eos: 3 %
Hematocrit: 39.6 % (ref 34.0–46.6)
Hemoglobin: 13 g/dL (ref 11.1–15.9)
Immature Grans (Abs): 0 10*3/uL (ref 0.0–0.1)
Immature Granulocytes: 0 %
Lymphocytes Absolute: 1.7 10*3/uL (ref 0.7–3.1)
Lymphs: 25 %
MCH: 27.3 pg (ref 26.6–33.0)
MCHC: 32.8 g/dL (ref 31.5–35.7)
MCV: 83 fL (ref 79–97)
Monocytes Absolute: 0.3 10*3/uL (ref 0.1–0.9)
Monocytes: 4 %
Neutrophils Absolute: 4.6 10*3/uL (ref 1.4–7.0)
Neutrophils: 67 %
Platelets: 261 10*3/uL (ref 150–450)
RBC: 4.76 x10E6/uL (ref 3.77–5.28)
RDW: 12.6 % (ref 11.7–15.4)
WBC: 6.8 10*3/uL (ref 3.4–10.8)

## 2020-05-01 LAB — LIPID PANEL
Chol/HDL Ratio: 3.6 ratio (ref 0.0–4.4)
Cholesterol, Total: 109 mg/dL (ref 100–199)
HDL: 30 mg/dL — ABNORMAL LOW (ref >39)
LDL Chol Calc (NIH): 62 mg/dL (ref 0–99)
Triglycerides: 85 mg/dL (ref 0–149)
VLDL Cholesterol Cal: 17 mg/dL (ref 5–40)

## 2020-05-01 LAB — URINALYSIS, ROUTINE W REFLEX MICROSCOPIC
Bilirubin Urine: NEGATIVE
Glucose, UA: 50 mg/dL — AB
Ketones, ur: NEGATIVE mg/dL
Leukocytes,Ua: NEGATIVE
Nitrite: NEGATIVE
Protein, ur: 30 mg/dL — AB
Specific Gravity, Urine: 1.021 (ref 1.005–1.030)
pH: 5 (ref 5.0–8.0)

## 2020-05-01 LAB — VITAMIN D 25 HYDROXY (VIT D DEFICIENCY, FRACTURES): Vit D, 25-Hydroxy: 25.3 ng/mL — ABNORMAL LOW (ref 30.0–100.0)

## 2020-05-01 LAB — TSH: TSH: 3.48 u[IU]/mL (ref 0.450–4.500)

## 2020-05-01 LAB — T4, FREE: Free T4: 0.93 ng/dL (ref 0.82–1.77)

## 2020-05-01 LAB — GLUCOSE, CAPILLARY: Glucose-Capillary: 153 mg/dL — ABNORMAL HIGH (ref 70–99)

## 2020-05-01 NOTE — Progress Notes (Signed)
PCP Redmond School, MD Cardiologist - pt denies  Chest x-ray - 05/01/20 EKG - 05/01/20 Stress Test - pt denies  ECHO - 12/17/16 Cardiac Cath - pt denies   Fasting Blood Sugar - pt is not sure  Checks Blood Sugar pt checks sugar about 2x a day if she feels her sensor is off   Blood Thinner Instructions: n/a Aspirin Instructions: n/a  ERAS Protcol - yes  PRE-SURGERY Ensure or G2- water  COVID TEST- 05/01/20  Coronavirus Screening  Have you experienced the following symptoms:  Cough yes/no: No Fever (>100.2F)  yes/no: No Runny nose yes/no: No Sore throat yes/no: No Difficulty breathing/shortness of breath  yes/no: No  Have you or a family member traveled in the last 14 days and where? yes/no: No   If the patient indicates "YES" to the above questions, their PAT will be rescheduled to limit the exposure to others and, the surgeon will be notified. THE PATIENT WILL NEED TO BE ASYMPTOMATIC FOR 14 DAYS.   If the patient is not experiencing any of these symptoms, the PAT nurse will instruct them to NOT bring anyone with them to their appointment since they may have these symptoms or traveled as well.   Please remind your patients and families that hospital visitation restrictions are in effect and the importance of the restrictions.     Anesthesia review: n/a  Patient denies shortness of breath, fever, cough and chest pain at PAT appointment   All instructions explained to the patient, with a verbal understanding of the material. Patient agrees to go over the instructions while at home for a better understanding. Patient also instructed to self quarantine after being tested for COVID-19. The opportunity to ask questions was provided.

## 2020-05-01 NOTE — Progress Notes (Signed)
Your procedure is scheduled on Friday October 22.  Report to [facility] Main Entrance "A" at 11:00 A.M., and check in at the Admitting office.  Call this number if you have problems the morning of surgery: [phone]  Call [phone] if you have any questions prior to your surgery date Monday-Friday 8am-4pm   Remember: Do not eat after midnight the night before your surgery  You may drink clear liquids until 10:00 A.M. the morning of your surgery.   Clear liquids allowed are: Water, Non-Citrus Juices (without pulp), Carbonated Beverages, Clear Tea, Black Coffee Only, and Gatorade  Please complete your PRE-SURGERY WATER that was provided to you by 10:00 A.M. the morning of your surgery.   Please, if able, drink it in one setting. DO NOT SIP.    Take these medicines the morning of surgery with A SIP OF WATER: atomoxetine (STRATTERA)  atorvastatin (LIPITOR) baclofen (LIORESAL) gabapentin (NEURONTIN)  hydrOXYzine (VISTARIL)  As of today, STOP taking any diclofenac (CATAFLAM), Aspirin (unless otherwise instructed by your surgeon), Aleve, Naproxen, Ibuprofen, Motrin, Advil, Goody's, BC's, all herbal medications, fish oil, and all vitamins.    WHAT DO I DO ABOUT MY DIABETES MEDICATION?  Dulaglutide (TRULICITY)   Day BEFORE surgery - take as usual  Morning of surgery - NONE  metFORMIN (GLUCOPHAGE-XR)  Day BEFORE surgery - take as usual  Morning of surgery - NONE   HOW TO MANAGE YOUR DIABETES BEFORE AND AFTER SURGERY  Why is it important to control my blood sugar before and after surgery? . Improving blood sugar levels before and after surgery helps healing and can limit problems. . A way of improving blood sugar control is eating a healthy diet by: o  Eating less sugar and carbohydrates o  Increasing activity/exercise o  Talking with your doctor about reaching your blood sugar goals . High blood sugars (greater than 180 mg/dL) can raise your risk of infections and slow  your recovery, so you will need to focus on controlling your diabetes during the weeks before surgery. . Make sure that the doctor who takes care of your diabetes knows about your planned surgery including the date and location.  How do I manage my blood sugar before surgery? . Check your blood sugar at least 4 times a day, starting 2 days before surgery, to make sure that the level is not too high or low. . Check your blood sugar the morning of your surgery when you wake up and every 2 hours until you get to the Short Stay unit. o If your blood sugar is less than 70 mg/dL, you will need to treat for low blood sugar: - Do not take insulin. - Treat a low blood sugar (less than 70 mg/dL) with  cup of clear juice (cranberry or apple), 4 glucose tablets, OR glucose gel. - Recheck blood sugar in 15 minutes after treatment (to make sure it is greater than 70 mg/dL). If your blood sugar is not greater than 70 mg/dL on recheck, call [phone] for further instructions. . Report your blood sugar to the short stay nurse when you get to Short Stay.  . If you are admitted to the hospital after surgery: o Your blood sugar will be checked by the staff and you will probably be given insulin after surgery (instead of oral diabetes medicines) to make sure you have good blood sugar levels. o The goal for blood sugar control after surgery is 80-180 mg/dL.     The Morning of Surgery  Do not wear jewelry, make-up or nail polish.  Do not wear lotions, powders, or perfumes, or deodorant  Do not shave 48 hours prior to surgery.  Do not bring valuables to the hospital.  [facility] is not responsible for any belongings or valuables.  If you are a smoker, DO NOT Smoke 24 hours prior to surgery  If you wear a CPAP at night please bring your mask the morning of surgery   Remember that you must have someone to transport you home after your surgery, and remain with you for 24 hours if you are discharged the same  day.   Please bring cases for contacts, glasses, hearing aids, dentures or bridgework because it cannot be worn into surgery.    Leave your suitcase in the car.  After surgery it may be brought to your room.  For patients admitted to the hospital, discharge time will be determined by your treatment team.  Patients discharged the day of surgery will not be allowed to drive home.    Special instructions:   [place]- Preparing For Surgery  Before surgery, you can play an important role. Because skin is not sterile, your skin needs to be as free of germs as possible. You can reduce the number of germs on your skin by washing with CHG (chlorahexidine gluconate) Soap before surgery.  CHG is an antiseptic cleaner which kills germs and bonds with the skin to continue killing germs even after washing.    Oral Hygiene is also important to reduce your risk of infection.  Remember - BRUSH YOUR TEETH THE MORNING OF SURGERY WITH YOUR REGULAR TOOTHPASTE  Please do not use if you have an allergy to CHG or antibacterial soaps. If your skin becomes reddened/irritated stop using the CHG.  Do not shave (including legs and underarms) for at least 48 hours prior to first CHG shower. It is OK to shave your face.  Please follow these instructions carefully.   1. Shower the NIGHT BEFORE SURGERY and the MORNING OF SURGERY with CHG Soap.   2. If you chose to wash your hair and body, wash as usual with your normal shampoo and body-wash/soap.  3. Rinse your hair and body thoroughly to remove the shampoo and soap.  4. Apply CHG directly to the skin (ONLY FROM THE NECK DOWN) and wash gently with a scrungie or a clean washcloth.   5. Do not use on open wounds or open sores. Avoid contact with your eyes, ears, mouth and genitals (private parts). Wash Face and genitals (private parts)  with your normal soap.   6. Wash thoroughly, paying special attention to the area where your surgery will be  performed.  7. Thoroughly rinse your body with warm water from the neck down.  8. DO NOT shower/wash with your normal soap after using and rinsing off the CHG Soap.  9. Pat yourself dry with a CLEAN TOWEL.  10. Wear CLEAN PAJAMAS to bed the night before surgery  11. Place CLEAN SHEETS on your bed the night of your first shower and DO NOT SLEEP WITH PETS.  12. Wear comfortable clothes the morning of surgery.     Day of Surgery:  Please shower the morning of surgery with the CHG soap Do not apply any deodorants/lotions. Please wear clean clothes to the hospital/surgery center.   Remember to brush your teeth WITH YOUR REGULAR TOOTHPASTE.   Please read over the following fact sheets that you were given.

## 2020-05-02 ENCOUNTER — Other Ambulatory Visit (HOSPITAL_COMMUNITY)
Admission: RE | Admit: 2020-05-02 | Discharge: 2020-05-02 | Disposition: A | Discharge status: Home / Self Care | Payer: 59 | Source: Ambulatory Visit | Attending: Specialist | Admitting: Specialist

## 2020-05-02 DIAGNOSIS — Z01812 Encounter for preprocedural laboratory examination: Secondary | ICD-10-CM | POA: Insufficient documentation

## 2020-05-02 DIAGNOSIS — Z20822 Contact with and (suspected) exposure to covid-19: Secondary | ICD-10-CM | POA: Insufficient documentation

## 2020-05-02 LAB — SARS CORONAVIRUS 2 (TAT 6-24 HRS): SARS Coronavirus 2: NEGATIVE

## 2020-05-02 LAB — HEMOGLOBIN A1C
Hgb A1c MFr Bld: 7 % — ABNORMAL HIGH (ref 4.8–5.6)
Mean Plasma Glucose: 154 mg/dL
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
## 2020-05-03 ENCOUNTER — Ambulatory Visit: Payer: 59 | Admitting: Specialist

## 2020-05-03 ENCOUNTER — Ambulatory Visit: Payer: 59 | Admitting: Physical Medicine and Rehabilitation

## 2020-05-03 ENCOUNTER — Telehealth: Payer: Self-pay | Admitting: Specialist

## 2020-05-03 MED ORDER — VANCOMYCIN HCL 1500 MG/300ML IV SOLN
1500.0000 mg | INTRAVENOUS | Status: AC
  Administered 2020-05-04: 1500 mg via INTRAVENOUS
  Filled 2020-05-03: qty 300

## 2020-05-03 MED ORDER — BUPIVACAINE LIPOSOME 1.3 % IJ SUSP
10.0000 mL | Freq: Once | INTRAMUSCULAR | Status: AC
  Administered 2020-05-04: 20 mL
  Filled 2020-05-03: qty 10

## 2020-05-03 NOTE — Telephone Encounter (Signed)
[employer] forms received. Sent to [employer]. 

## 2020-05-04 ENCOUNTER — Encounter (HOSPITAL_COMMUNITY): Payer: Self-pay | Admitting: Specialist

## 2020-05-04 ENCOUNTER — Ambulatory Visit (HOSPITAL_COMMUNITY)
Admission: RE | Disposition: A | Discharge status: Home / Self Care | Payer: Self-pay | Source: Home / Self Care | Attending: Specialist

## 2020-05-04 ENCOUNTER — Other Ambulatory Visit: Payer: Self-pay

## 2020-05-04 ENCOUNTER — Ambulatory Visit (HOSPITAL_COMMUNITY): Payer: 59

## 2020-05-04 ENCOUNTER — Ambulatory Visit (HOSPITAL_COMMUNITY): Payer: 59 | Admitting: Certified Registered"

## 2020-05-04 ENCOUNTER — Observation Stay (HOSPITAL_COMMUNITY)
Admission: RE | Admit: 2020-05-04 | Discharge: 2020-05-05 | Disposition: A | Discharge status: Home / Self Care | Payer: 59 | Attending: Specialist | Admitting: Specialist

## 2020-05-04 DIAGNOSIS — I10 Essential (primary) hypertension: Secondary | ICD-10-CM | POA: Diagnosis not present

## 2020-05-04 DIAGNOSIS — M5116 Intervertebral disc disorders with radiculopathy, lumbar region: Secondary | ICD-10-CM | POA: Diagnosis not present

## 2020-05-04 DIAGNOSIS — Z853 Personal history of malignant neoplasm of breast: Secondary | ICD-10-CM | POA: Insufficient documentation

## 2020-05-04 DIAGNOSIS — Z87891 Personal history of nicotine dependence: Secondary | ICD-10-CM | POA: Diagnosis not present

## 2020-05-04 DIAGNOSIS — J45909 Unspecified asthma, uncomplicated: Secondary | ICD-10-CM | POA: Diagnosis not present

## 2020-05-04 DIAGNOSIS — E119 Type 2 diabetes mellitus without complications: Secondary | ICD-10-CM | POA: Diagnosis not present

## 2020-05-04 DIAGNOSIS — M549 Dorsalgia, unspecified: Secondary | ICD-10-CM | POA: Diagnosis present

## 2020-05-04 DIAGNOSIS — M48061 Spinal stenosis, lumbar region without neurogenic claudication: Secondary | ICD-10-CM | POA: Diagnosis not present

## 2020-05-04 DIAGNOSIS — Z419 Encounter for procedure for purposes other than remedying health state, unspecified: Secondary | ICD-10-CM

## 2020-05-04 HISTORY — PX: LUMBAR LAMINECTOMY/DECOMPRESSION MICRODISCECTOMY: SHX5026

## 2020-05-04 LAB — GLUCOSE, CAPILLARY
Glucose-Capillary: 124 mg/dL — ABNORMAL HIGH (ref 70–99)
Glucose-Capillary: 107 mg/dL — ABNORMAL HIGH (ref 70–99)
Glucose-Capillary: 114 mg/dL — ABNORMAL HIGH (ref 70–99)
Glucose-Capillary: 157 mg/dL — ABNORMAL HIGH (ref 70–99)

## 2020-05-04 SURGERY — LUMBAR LAMINECTOMY/DECOMPRESSION MICRODISCECTOMY
Anesthesia: General | Site: Spine Lumbar

## 2020-05-04 MED ORDER — ROCURONIUM BROMIDE 100 MG/10ML IV SOLN
INTRAVENOUS | Status: DC | PRN
  Administered 2020-05-04: 20 mg via INTRAVENOUS

## 2020-05-04 MED ORDER — HYDROXYZINE HCL 10 MG PO TABS
25.0000 mg | ORAL_TABLET | Freq: Four times a day (QID) | ORAL | Status: DC | PRN
  Filled 2020-05-04: qty 3

## 2020-05-04 MED ORDER — MENTHOL 3 MG MT LOZG: 1.0000 | LOZENGE | OROMUCOSAL | Status: DC | PRN

## 2020-05-04 MED ORDER — PROPOFOL 10 MG/ML IV BOLUS
INTRAVENOUS | Status: DC | PRN
  Administered 2020-05-04: 200 mg via INTRAVENOUS

## 2020-05-04 MED ORDER — FENTANYL CITRATE (PF) 100 MCG/2ML IJ SOLN
INTRAMUSCULAR | Status: DC | PRN
  Administered 2020-05-04 (×2): 50 ug via INTRAVENOUS

## 2020-05-04 MED ORDER — GABAPENTIN 100 MG PO CAPS
200.0000 mg | ORAL_CAPSULE | Freq: Three times a day (TID) | ORAL | Status: DC
  Administered 2020-05-04: 200 mg via ORAL
  Filled 2020-05-04: qty 2

## 2020-05-04 MED ORDER — OXYCODONE HCL 5 MG/5ML PO SOLN: 5.0000 mg | Freq: Once | ORAL | Status: DC | PRN

## 2020-05-04 MED ORDER — HYDROCODONE-ACETAMINOPHEN 7.5-325 MG PO TABS: 2.0000 | ORAL_TABLET | ORAL | Status: DC | PRN

## 2020-05-04 MED ORDER — GLYCOPYRROLATE PF 0.2 MG/ML IJ SOSY
PREFILLED_SYRINGE | INTRAMUSCULAR | Status: AC
  Filled 2020-05-04: qty 1

## 2020-05-04 MED ORDER — BACLOFEN 10 MG PO TABS: 10.0000 mg | ORAL_TABLET | Freq: Two times a day (BID) | ORAL | Status: DC | PRN

## 2020-05-04 MED ORDER — ATOMOXETINE HCL 40 MG PO CAPS
40.0000 mg | ORAL_CAPSULE | Freq: Every day | ORAL | Status: DC
  Filled 2020-05-04: qty 1

## 2020-05-04 MED ORDER — HYDROCODONE-ACETAMINOPHEN 7.5-325 MG PO TABS
1.0000 | ORAL_TABLET | Freq: Four times a day (QID) | ORAL | Status: DC
  Administered 2020-05-04 – 2020-05-05 (×2): 1 via ORAL
  Filled 2020-05-04 (×2): qty 1

## 2020-05-04 MED ORDER — AMISULPRIDE (ANTIEMETIC) 5 MG/2ML IV SOLN: 10.0000 mg | Freq: Once | INTRAVENOUS | Status: DC | PRN

## 2020-05-04 MED ORDER — BUPIVACAINE HCL 0.5 % IJ SOLN
INTRAMUSCULAR | Status: DC | PRN
  Administered 2020-05-04: 20 mL

## 2020-05-04 MED ORDER — PHENYLEPHRINE 40 MCG/ML (10ML) SYRINGE FOR IV PUSH (FOR BLOOD PRESSURE SUPPORT)
PREFILLED_SYRINGE | INTRAVENOUS | Status: AC
  Filled 2020-05-04: qty 20

## 2020-05-04 MED ORDER — PHENOL 1.4 % MT LIQD: 1.0000 | OROMUCOSAL | Status: DC | PRN

## 2020-05-04 MED ORDER — PROMETHAZINE HCL 25 MG/ML IJ SOLN: 6.2500 mg | INTRAMUSCULAR | Status: DC | PRN

## 2020-05-04 MED ORDER — SCOPOLAMINE 1 MG/3DAYS TD PT72
MEDICATED_PATCH | TRANSDERMAL | Status: AC
  Filled 2020-05-04: qty 1

## 2020-05-04 MED ORDER — SUGAMMADEX SODIUM 200 MG/2ML IV SOLN
INTRAVENOUS | Status: DC | PRN
  Administered 2020-05-04: 300 mg via INTRAVENOUS

## 2020-05-04 MED ORDER — ONDANSETRON HCL 4 MG PO TABS: 4.0000 mg | ORAL_TABLET | Freq: Four times a day (QID) | ORAL | Status: DC | PRN

## 2020-05-04 MED ORDER — METHOCARBAMOL 500 MG PO TABS
ORAL_TABLET | ORAL | Status: AC
  Filled 2020-05-04: qty 1

## 2020-05-04 MED ORDER — CHLORHEXIDINE GLUCONATE 0.12 % MT SOLN
15.0000 mL | Freq: Once | OROMUCOSAL | Status: AC
  Administered 2020-05-04: 15 mL via OROMUCOSAL
  Filled 2020-05-04: qty 15

## 2020-05-04 MED ORDER — INSULIN ASPART 100 UNIT/ML ~~LOC~~ SOLN
4.0000 [IU] | Freq: Three times a day (TID) | SUBCUTANEOUS | Status: DC
  Administered 2020-05-05: 4 [IU] via SUBCUTANEOUS

## 2020-05-04 MED ORDER — DEXAMETHASONE SODIUM PHOSPHATE 10 MG/ML IJ SOLN
INTRAMUSCULAR | Status: AC
  Filled 2020-05-04: qty 3

## 2020-05-04 MED ORDER — KETAMINE HCL 10 MG/ML IJ SOLN
INTRAMUSCULAR | Status: DC | PRN
  Administered 2020-05-04: 5 mg via INTRAVENOUS
  Administered 2020-05-04: 10 mg via INTRAVENOUS
  Administered 2020-05-04 (×3): 5 mg via INTRAVENOUS

## 2020-05-04 MED ORDER — ALUM & MAG HYDROXIDE-SIMETH 200-200-20 MG/5ML PO SUSP: 30.0000 mL | Freq: Four times a day (QID) | ORAL | Status: DC | PRN

## 2020-05-04 MED ORDER — FENTANYL CITRATE (PF) 250 MCG/5ML IJ SOLN
INTRAMUSCULAR | Status: DC | PRN
Start: 2020-05-04 — End: 2020-05-04
  Administered 2020-05-04 (×2): 50 ug via INTRAVENOUS

## 2020-05-04 MED ORDER — FENTANYL CITRATE (PF) 250 MCG/5ML IJ SOLN
INTRAMUSCULAR | Status: AC
  Filled 2020-05-04: qty 5

## 2020-05-04 MED ORDER — PHENYLEPHRINE HCL-NACL 10-0.9 MG/250ML-% IV SOLN
INTRAVENOUS | Status: DC | PRN
  Administered 2020-05-04: 20 ug/min via INTRAVENOUS

## 2020-05-04 MED ORDER — BUPIVACAINE HCL (PF) 0.5 % IJ SOLN
INTRAMUSCULAR | Status: AC
  Filled 2020-05-04: qty 20

## 2020-05-04 MED ORDER — LACTATED RINGERS IV SOLN: INTRAVENOUS | Status: DC | PRN

## 2020-05-04 MED ORDER — METHOCARBAMOL 1000 MG/10ML IJ SOLN
500.0000 mg | Freq: Four times a day (QID) | INTRAVENOUS | Status: DC | PRN
  Filled 2020-05-04: qty 5

## 2020-05-04 MED ORDER — METHOCARBAMOL 500 MG PO TABS
500.0000 mg | ORAL_TABLET | Freq: Four times a day (QID) | ORAL | Status: DC | PRN
  Administered 2020-05-04 – 2020-05-05 (×2): 500 mg via ORAL
  Filled 2020-05-04: qty 1

## 2020-05-04 MED ORDER — POLYETHYLENE GLYCOL 3350 17 G PO PACK: 17.0000 g | PACK | Freq: Every day | ORAL | Status: DC | PRN

## 2020-05-04 MED ORDER — VANCOMYCIN HCL 1500 MG/300ML IV SOLN
1500.0000 mg | Freq: Once | INTRAVENOUS | Status: AC
  Administered 2020-05-05: 1500 mg via INTRAVENOUS
  Filled 2020-05-04: qty 300

## 2020-05-04 MED ORDER — OXYCODONE HCL 5 MG PO TABS: 5.0000 mg | ORAL_TABLET | Freq: Once | ORAL | Status: DC | PRN

## 2020-05-04 MED ORDER — MIDAZOLAM HCL 2 MG/2ML IJ SOLN
INTRAMUSCULAR | Status: DC | PRN
  Administered 2020-05-04: 2 mg via INTRAVENOUS

## 2020-05-04 MED ORDER — GLYCOPYRROLATE 0.2 MG/ML IJ SOLN
INTRAMUSCULAR | Status: DC | PRN
  Administered 2020-05-04: .2 mg via INTRAVENOUS

## 2020-05-04 MED ORDER — INSULIN ASPART 100 UNIT/ML ~~LOC~~ SOLN
0.0000 [IU] | Freq: Three times a day (TID) | SUBCUTANEOUS | Status: DC
  Administered 2020-05-05: 2 [IU] via SUBCUTANEOUS

## 2020-05-04 MED ORDER — KETOROLAC TROMETHAMINE 15 MG/ML IJ SOLN
15.0000 mg | Freq: Four times a day (QID) | INTRAMUSCULAR | Status: DC
  Administered 2020-05-04 – 2020-05-05 (×3): 15 mg via INTRAVENOUS
  Filled 2020-05-04 (×2): qty 1

## 2020-05-04 MED ORDER — ATORVASTATIN CALCIUM 10 MG PO TABS
20.0000 mg | ORAL_TABLET | Freq: Every day | ORAL | Status: DC
  Administered 2020-05-04: 20 mg via ORAL
  Filled 2020-05-04: qty 2

## 2020-05-04 MED ORDER — THROMBIN (RECOMBINANT) 20000 UNITS EX SOLR
CUTANEOUS | Status: AC
  Filled 2020-05-04: qty 20000

## 2020-05-04 MED ORDER — FENTANYL CITRATE (PF) 100 MCG/2ML IJ SOLN: 50.0000 ug | Freq: Once | INTRAMUSCULAR | Status: AC

## 2020-05-04 MED ORDER — BISACODYL 5 MG PO TBEC: 5.0000 mg | DELAYED_RELEASE_TABLET | Freq: Every day | ORAL | Status: DC | PRN

## 2020-05-04 MED ORDER — ACETAMINOPHEN 650 MG RE SUPP: 650.0000 mg | RECTAL | Status: DC | PRN

## 2020-05-04 MED ORDER — SODIUM CHLORIDE 0.9% FLUSH: 3.0000 mL | INTRAVENOUS | Status: DC | PRN

## 2020-05-04 MED ORDER — ROCURONIUM 10MG/ML (10ML) SYRINGE FOR MEDFUSION PUMP - OPTIME
INTRAVENOUS | Status: DC | PRN
  Administered 2020-05-04: 20 mg via INTRAVENOUS
  Administered 2020-05-04: 80 mg via INTRAVENOUS

## 2020-05-04 MED ORDER — KETOROLAC TROMETHAMINE 15 MG/ML IJ SOLN
INTRAMUSCULAR | Status: AC
  Filled 2020-05-04: qty 1

## 2020-05-04 MED ORDER — LIDOCAINE HCL (CARDIAC) PF 100 MG/5ML IV SOSY
PREFILLED_SYRINGE | INTRAVENOUS | Status: DC | PRN
  Administered 2020-05-04: 100 mg via INTRATRACHEAL

## 2020-05-04 MED ORDER — MIDAZOLAM HCL 2 MG/2ML IJ SOLN
INTRAMUSCULAR | Status: AC
  Filled 2020-05-04: qty 2

## 2020-05-04 MED ORDER — THROMBIN 20000 UNITS EX SOLR
CUTANEOUS | Status: DC | PRN
  Administered 2020-05-04: 20 mL via TOPICAL

## 2020-05-04 MED ORDER — MORPHINE SULFATE (PF) 2 MG/ML IV SOLN: 1.0000 mg | INTRAVENOUS | Status: DC | PRN

## 2020-05-04 MED ORDER — ONDANSETRON HCL 4 MG/2ML IJ SOLN
INTRAMUSCULAR | Status: AC
  Filled 2020-05-04: qty 2

## 2020-05-04 MED ORDER — BISMUTH SUBSALICYLATE 262 MG/15ML PO SUSP
30.0000 mL | Freq: Four times a day (QID) | ORAL | Status: DC | PRN
  Filled 2020-05-04: qty 236

## 2020-05-04 MED ORDER — MEPERIDINE HCL 25 MG/ML IJ SOLN: 6.2500 mg | INTRAMUSCULAR | Status: DC | PRN

## 2020-05-04 MED ORDER — TRAZODONE HCL 50 MG PO TABS
50.0000 mg | ORAL_TABLET | Freq: Every evening | ORAL | Status: DC | PRN
  Filled 2020-05-04: qty 1

## 2020-05-04 MED ORDER — FENTANYL CITRATE (PF) 100 MCG/2ML IJ SOLN
INTRAMUSCULAR | Status: AC
Start: 2020-05-04 — End: 2020-05-04
  Administered 2020-05-04: 50 ug via INTRAVENOUS
  Filled 2020-05-04: qty 2

## 2020-05-04 MED ORDER — ORAL CARE MOUTH RINSE: 15.0000 mL | Freq: Once | OROMUCOSAL | Status: AC

## 2020-05-04 MED ORDER — 0.9 % SODIUM CHLORIDE (POUR BTL) OPTIME
TOPICAL | Status: DC | PRN
  Administered 2020-05-04: 1000 mL

## 2020-05-04 MED ORDER — HYDROMORPHONE HCL 1 MG/ML IJ SOLN
INTRAMUSCULAR | Status: AC
  Filled 2020-05-04: qty 1

## 2020-05-04 MED ORDER — HYDROCODONE-ACETAMINOPHEN 10-325 MG PO TABS
1.0000 | ORAL_TABLET | ORAL | Status: DC | PRN
  Administered 2020-05-04 – 2020-05-05 (×2): 1 via ORAL
  Filled 2020-05-04 (×2): qty 1

## 2020-05-04 MED ORDER — DOCUSATE SODIUM 100 MG PO CAPS
100.0000 mg | ORAL_CAPSULE | Freq: Two times a day (BID) | ORAL | Status: DC
  Administered 2020-05-04: 100 mg via ORAL
  Filled 2020-05-04: qty 1

## 2020-05-04 MED ORDER — SODIUM CHLORIDE 0.9 % IV SOLN: INTRAVENOUS | Status: DC

## 2020-05-04 MED ORDER — ONDANSETRON HCL 4 MG/2ML IJ SOLN: 4.0000 mg | Freq: Four times a day (QID) | INTRAMUSCULAR | Status: DC | PRN

## 2020-05-04 MED ORDER — ROCURONIUM BROMIDE 10 MG/ML (PF) SYRINGE
PREFILLED_SYRINGE | INTRAVENOUS | Status: AC
  Filled 2020-05-04: qty 10

## 2020-05-04 MED ORDER — KETAMINE HCL 50 MG/5ML IJ SOSY
PREFILLED_SYRINGE | INTRAMUSCULAR | Status: AC
  Filled 2020-05-04: qty 5

## 2020-05-04 MED ORDER — ACETAMINOPHEN 325 MG PO TABS: 650.0000 mg | ORAL_TABLET | ORAL | Status: DC | PRN

## 2020-05-04 MED ORDER — DEXAMETHASONE SODIUM PHOSPHATE 10 MG/ML IJ SOLN
INTRAMUSCULAR | Status: AC
  Filled 2020-05-04: qty 1

## 2020-05-04 MED ORDER — LOSARTAN POTASSIUM 50 MG PO TABS
50.0000 mg | ORAL_TABLET | Freq: Every day | ORAL | Status: DC
  Administered 2020-05-04: 50 mg via ORAL
  Filled 2020-05-04: qty 1

## 2020-05-04 MED ORDER — SODIUM CHLORIDE 0.9% FLUSH: 3.0000 mL | Freq: Two times a day (BID) | INTRAVENOUS | Status: DC

## 2020-05-04 MED ORDER — DULOXETINE HCL 30 MG PO CPEP
60.0000 mg | ORAL_CAPSULE | Freq: Every day | ORAL | Status: DC
  Administered 2020-05-04: 60 mg via ORAL
  Filled 2020-05-04: qty 2

## 2020-05-04 MED ORDER — FLEET ENEMA 7-19 GM/118ML RE ENEM: 1.0000 | ENEMA | Freq: Once | RECTAL | Status: DC | PRN

## 2020-05-04 MED ORDER — SCOPOLAMINE 1 MG/3DAYS TD PT72
MEDICATED_PATCH | TRANSDERMAL | Status: DC | PRN
  Administered 2020-05-04: 1 via TRANSDERMAL

## 2020-05-04 MED ORDER — PHENYLEPHRINE 40 MCG/ML (10ML) SYRINGE FOR IV PUSH (FOR BLOOD PRESSURE SUPPORT)
PREFILLED_SYRINGE | INTRAVENOUS | Status: DC | PRN
  Administered 2020-05-04: 40 ug via INTRAVENOUS
  Administered 2020-05-04: 80 ug via INTRAVENOUS
  Administered 2020-05-04: 40 ug via INTRAVENOUS
  Administered 2020-05-04 (×2): 80 ug via INTRAVENOUS

## 2020-05-04 MED ORDER — HYDROMORPHONE HCL 1 MG/ML IJ SOLN
0.2500 mg | INTRAMUSCULAR | Status: DC | PRN
  Administered 2020-05-04 (×2): 0.5 mg via INTRAVENOUS

## 2020-05-04 MED ORDER — ONDANSETRON HCL 4 MG/2ML IJ SOLN
INTRAMUSCULAR | Status: DC | PRN
  Administered 2020-05-04: 4 mg via INTRAVENOUS

## 2020-05-04 MED ORDER — LACTATED RINGERS IV SOLN: INTRAVENOUS | Status: DC

## 2020-05-04 SURGICAL SUPPLY — 53 items
ADH SKN CLS APL DERMABOND .7 (GAUZE/BANDAGES/DRESSINGS) ×1
BUR SABER RD CUTTING 3.0 (BURR) ×1 IMPLANT
BUR SABER RD CUTTING 3.0MM (BURR) ×1
CANISTER SUCT 3000ML PPV (MISCELLANEOUS) ×3 IMPLANT
COVER SURGICAL LIGHT HANDLE (MISCELLANEOUS) ×3 IMPLANT
COVER WAND RF STERILE (DRAPES) ×1 IMPLANT
DERMABOND ADVANCED (GAUZE/BANDAGES/DRESSINGS) ×2
DERMABOND ADVANCED .7 DNX12 (GAUZE/BANDAGES/DRESSINGS) ×1 IMPLANT
DRAPE HALF SHEET 40X57 (DRAPES) ×4 IMPLANT
DRAPE INCISE IOBAN 66X45 STRL (DRAPES) IMPLANT
DRAPE MICROSCOPE LEICA (MISCELLANEOUS) ×3 IMPLANT
DRAPE SURG 17X23 STRL (DRAPES) ×12 IMPLANT
DRSG MEPILEX BORDER 4X4 (GAUZE/BANDAGES/DRESSINGS) ×2 IMPLANT
DRSG MEPILEX BORDER 4X8 (GAUZE/BANDAGES/DRESSINGS) IMPLANT
DURAPREP 26ML APPLICATOR (WOUND CARE) ×3 IMPLANT
ELECT REM PT RETURN 9FT ADLT (ELECTROSURGICAL) ×3
ELECTRODE REM PT RTRN 9FT ADLT (ELECTROSURGICAL) ×1 IMPLANT
EVACUATOR 1/8 PVC DRAIN (DRAIN) IMPLANT
GLOVE BIOGEL PI IND STRL 8 (GLOVE) ×1 IMPLANT
GLOVE BIOGEL PI INDICATOR 8 (GLOVE) ×2
GLOVE ECLIPSE 9.0 STRL (GLOVE) ×3 IMPLANT
GLOVE ORTHO TXT STRL SZ7.5 (GLOVE) ×3 IMPLANT
GLOVE SURG 8.5 LATEX PF (GLOVE) ×3 IMPLANT
GOWN STRL REUS W/ TWL LRG LVL3 (GOWN DISPOSABLE) ×1 IMPLANT
GOWN STRL REUS W/TWL 2XL LVL3 (GOWN DISPOSABLE) ×6 IMPLANT
GOWN STRL REUS W/TWL LRG LVL3 (GOWN DISPOSABLE) ×3
KIT BASIN OR (CUSTOM PROCEDURE TRAY) ×3 IMPLANT
KIT TURNOVER KIT B (KITS) ×3 IMPLANT
NDL SPNL 18GX3.5 QUINCKE PK (NEEDLE) ×2 IMPLANT
NEEDLE SPNL 18GX3.5 QUINCKE PK (NEEDLE) ×9 IMPLANT
NS IRRIG 1000ML POUR BTL (IV SOLUTION) ×3 IMPLANT
PACK LAMINECTOMY ORTHO (CUSTOM PROCEDURE TRAY) ×3 IMPLANT
PAD ARMBOARD 7.5X6 YLW CONV (MISCELLANEOUS) ×6 IMPLANT
PATTIES SURGICAL .5 X.5 (GAUZE/BANDAGES/DRESSINGS) ×2 IMPLANT
PATTIES SURGICAL .75X.75 (GAUZE/BANDAGES/DRESSINGS) ×2 IMPLANT
PATTIES SURGICAL 1X1 (DISPOSABLE) IMPLANT
SPONGE LAP 4X18 RFD (DISPOSABLE) IMPLANT
SPONGE SURGIFOAM ABS GEL 100 (HEMOSTASIS) ×2 IMPLANT
STAPLER VISISTAT 35W (STAPLE) ×2 IMPLANT
SUT VIC AB 0 CT1 27 (SUTURE) ×3
SUT VIC AB 0 CT1 27XBRD ANBCTR (SUTURE) IMPLANT
SUT VIC AB 1 CT1 27 (SUTURE) ×3
SUT VIC AB 1 CT1 27XBRD ANBCTR (SUTURE) IMPLANT
SUT VIC AB 1 CTX 36 (SUTURE) ×3
SUT VIC AB 1 CTX36XBRD ANBCTR (SUTURE) IMPLANT
SUT VIC AB 2-0 CT1 27 (SUTURE)
SUT VIC AB 2-0 CT1 TAPERPNT 27 (SUTURE) IMPLANT
SUT VIC AB 3-0 X1 27 (SUTURE) ×3 IMPLANT
SUT VICRYL 0 UR6 27IN ABS (SUTURE) ×3 IMPLANT
TOWEL GREEN STERILE (TOWEL DISPOSABLE) ×3 IMPLANT
TOWEL GREEN STERILE FF (TOWEL DISPOSABLE) ×3 IMPLANT
TRAY FOLEY MTR SLVR 16FR STAT (SET/KITS/TRAYS/PACK) ×2 IMPLANT
WATER STERILE IRR 1000ML POUR (IV SOLUTION) ×1 IMPLANT

## 2020-05-04 NOTE — Anesthesia Procedure Notes (Signed)
Procedure Name: Intubation Date/Time: 05/04/2020 2:30 PM Performed by: Gwyndolyn Saxon, CRNA Pre-anesthesia Checklist: Patient identified, Emergency Drugs available, Suction available and Patient being monitored Patient Re-evaluated:Patient Re-evaluated prior to induction Oxygen Delivery Method: Circle system utilized Preoxygenation: Pre-oxygenation with 100% oxygen Induction Type: IV induction Ventilation: Mask ventilation without difficulty Laryngoscope Size: Miller and 3 Grade View: Grade II Tube type: Oral Tube size: 7.5 mm Number of attempts: 1 Airway Equipment and Method: Patient positioned with wedge pillow and Stylet Placement Confirmation: ETT inserted through vocal cords under direct vision,  positive ETCO2 and breath sounds checked- equal and bilateral Secured at: 23 cm Tube secured with: Tape Dental Injury: Teeth and Oropharynx as per pre-operative assessment

## 2020-05-04 NOTE — Anesthesia Postprocedure Evaluation (Signed)
Anesthesia Post Note  Patient: Kelly Mendez  Procedure(s) Performed: BILATERAL LUMBAR FIVE THROUGH SACRAL ONE MICRODISCECTOMY (N/A Spine Lumbar)     Patient location during evaluation: PACU Anesthesia Type: General Level of consciousness: awake and alert Pain management: pain level controlled Vital Signs Assessment: post-procedure vital signs reviewed and stable Respiratory status: spontaneous breathing, nonlabored ventilation, respiratory function stable and patient connected to nasal cannula oxygen Cardiovascular status: blood pressure returned to baseline and stable Postop Assessment: no apparent nausea or vomiting Anesthetic complications: no   No complications documented.  Last Vitals:  Vitals:   05/04/20 1713 05/04/20 1728  BP: 134/83 (!) 146/93  Pulse: 98   Resp: 16 19  Temp: 36.7 C   SpO2: 95% 100%    Last Pain:  Vitals:   05/04/20 1734  TempSrc:   PainSc: Asleep                 [name]

## 2020-05-04 NOTE — H&P (Signed)
Kelly Mendez is an 44 y.o. female.   Chief Complaint: back pain and LE radiculopathy  HPI:44 year old female with history of bilateral L5-S1 HNP comes in for preop evaluation.  Symptoms unchanged from previous visit.  She is wanting to proceed with bilateral L5-S1 microdiscectomy as scheduled   Past Medical History:  Diagnosis Date  . Abnormal Pap smear of cervix   . Anxiety   . Asthma   . Asymptomatic microscopic hematuria 09/29/2017  . 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 Concussion    around age 4  . Diabetes type 2, uncontrolled ([place])    new diagnosis in 08/2016  . DVT (deep venous thrombosis) ([place]) 11/24/2016   in rt arm  . Family history of breast cancer   . Family history of neurofibromatosis   . Family history of ovarian cancer   . History of radiation therapy 04/21/17- 05/20/17   Right Breast 40.05 Gy in 15 fractions followed by a 10 Gy boost in 5 fractions to yield a total dose of 50.05 Gy  . HPV in female   . Hyperlipemia   . Hypertension   . Malignant neoplasm of upper-inner quadrant of right breast in female, estrogen receptor negative ([place]) 10/21/2016  . Neuromuscular disorder (HCC)    neuropathy  . Pelvic mass in female 06/16/2017  . Personal history of chemotherapy 2018  . Personal history of radiation therapy 2018  . Pneumonia    2014   . PTSD (post-traumatic stress disorder)     Past Surgical History:  Procedure Laterality Date  . BREAST LUMPECTOMY Right 03/18/2017  . BREAST LUMPECTOMY WITH RADIOACTIVE SEED AND SENTINEL LYMPH NODE BIOPSY Right 03/18/2017   Procedure: RIGHT BREAST LUMPECTOMY WITH RADIOACTIVE SEED AND RIGHT SENTINEL LYMPH NODE BIOPSY;  Surgeon: [name], MD;  Location: [facility];  Service: General;  Laterality: Right;  . COLPOSCOPY    . left ovary removed  1978   was told her ovary was removed but Dr. Denman George found that was not the case. Suspect this was originally an ovarian  cystectomy  . PORTACATH PLACEMENT Right 10/29/2016   Procedure: INSERTION PORT-A-CATH WITH [place];  Surgeon: [name], MD;  Location: [facility];  Service: General;  Laterality: Right;  . portacath removal     oct. 5 2018  . ROBOTIC ASSISTED TOTAL HYSTERECTOMY Bilateral 06/16/2017   Procedure: XI ROBOTIC ASSISTED TOTAL HYSTERECTOMY WITH BILATERAL SALPINGECTOMY,  OOPHERECTOMY;  Surgeon: [name], MD;  Location: [facility];  Service: Gynecology;  Laterality: Bilateral;    Family History  Problem Relation Age of Onset  . Breast cancer Mother 11  . Hepatitis C Father   . Ovarian cancer Maternal Aunt        dx in her 22s  . Neurofibromatosis Maternal Uncle   . Brain cancer Maternal Uncle 58  . Lung cancer Maternal Grandfather   . Kidney failure Paternal Grandmother   . Heart attack Paternal Grandfather   . Ovarian cancer Maternal Aunt        dx in her 17s  . Neurofibromatosis Maternal Aunt   . Ovarian cancer Maternal Aunt   . Neurofibromatosis Maternal Aunt   . Cervical cancer Maternal Aunt   . Neurofibromatosis Maternal Aunt   . Cancer Maternal Aunt        cancer on the bottom of her foot  . Breast cancer Other        MGF's sisters  . Neurofibromatosis  Other        MGM's paternal aunt   Social History:  reports that she quit smoking about 3 years ago. Her smoking use included cigarettes. She has a 3.75 pack-year smoking history. She has never used smokeless tobacco. She reports current alcohol use of about 1.0 standard drink of alcohol per week. She reports that she does not use drugs.  Allergies:  Allergies  Allergen Reactions  . Lisinopril     Cough, itchy throat  . Penicillins Other (See Comments)    As a child Has patient had a PCN reaction causing immediate rash, facial/tongue/throat swelling, SOB or lightheadedness with hypotension: unknown Has patient had a PCN reaction causing severe rash involving mucus membranes or skin necrosis: unknown Has patient had a PCN reaction  that required hospitalization unknown Has patient had a PCN reaction occurring within the last 10 years: no If all of the above answers are "NO", then may proceed with Cephalosporin use.     No medications prior to admission.    Results for orders placed or performed during the hospital encounter of 05/02/20 (from the past 48 hour(s))  SARS CORONAVIRUS 2 (TAT 6-24 HRS) Nasopharyngeal Nasopharyngeal Swab     Status: None   Collection Time: 05/02/20 12:19 PM   Specimen: Nasopharyngeal Swab  Result Value Ref Range   SARS Coronavirus 2 NEGATIVE NEGATIVE    Comment: (NOTE) SARS-CoV-2 target nucleic acids are NOT DETECTED.  The SARS-CoV-2 RNA is generally detectable in upper and lower respiratory specimens during the acute phase of infection. Negative results do not preclude SARS-CoV-2 infection, do not rule out co-infections with other pathogens, and should not be used as the sole basis for treatment or other patient management decisions. Negative results must be combined with clinical observations, patient history, and epidemiological information. The expected result is Negative.  Fact Sheet for Patients: SugarRoll.be  Fact Sheet for Healthcare Providers: https://www.woods-mathews.com/  This test is not yet approved or cleared by the [place] FDA and  has been authorized for detection and/or diagnosis of SARS-CoV-2 by FDA under an Emergency Use Authorization (EUA). This EUA will remain  in effect (meaning this test can be used) for the duration of the COVID-19 declaration under Se ction 564(b)(1) of the Act, 21 U.S.C. section 360bbb-3(b)(1), unless the authorization is terminated or revoked sooner.  Performed at [facility], [place] [address] [place], [place] [address]    No results found.  Review of Systems  Constitutional: Positive for activity change.  HENT: Negative.   Respiratory: Negative.   Cardiovascular:  Negative.   Gastrointestinal: Negative.   Genitourinary: Negative.   Musculoskeletal: Positive for back pain and gait problem.  Skin: Negative.   Neurological: Positive for numbness.  Psychiatric/Behavioral: Negative.     Last menstrual period 01/10/2017. Physical Exam HENT:     Head: Normocephalic and atraumatic.  Eyes:     Extraocular Movements: Extraocular movements intact.     Pupils: Pupils are equal, round, and reactive to light.  Cardiovascular:     Rate and Rhythm: Regular rhythm.  Pulmonary:     Effort: Pulmonary effort is normal. No respiratory distress.     Breath sounds: Normal breath sounds.  Abdominal:     General: Bowel sounds are normal. There is no distension.  Musculoskeletal:        General: Tenderness present.     Cervical back: Normal range of motion.  Skin:    General: Skin is warm and dry.  Neurological:  General: No focal deficit present.     Mental Status: She is alert and oriented to person, place, and time.  Psychiatric:        Mood and Affect: Mood normal.      Assessment/Plan bilat L5-S1 HNP  Will proceed with L5-S1 microdiscectomy as scheduled.  Surgical procedure discussed and all questions answered.    Benjiman Core, PA-C 05/04/2020, 10:16 AM

## 2020-05-04 NOTE — Transfer of Care (Signed)
Immediate Anesthesia Transfer of Care Note  Patient: Khaleah Duer Oberry  Procedure(s) Performed: BILATERAL LUMBAR FIVE THROUGH SACRAL ONE MICRODISCECTOMY (N/A Spine Lumbar)  Patient Location: PACU  Anesthesia Type:General  Level of Consciousness: awake, alert , oriented and patient cooperative  Airway & Oxygen Therapy: Patient Spontanous Breathing and Patient connected to nasal cannula oxygen  Post-op Assessment: Report given to RN and Post -op Vital signs reviewed and stable  Post vital signs: Reviewed and stable  Last Vitals:  Vitals Value Taken Time  BP    Temp    Pulse 98 05/04/20 1712  Resp 20 05/04/20 1713  SpO2 95 % 05/04/20 1712  Vitals shown include unvalidated device data.  Last Pain:  Vitals:   05/04/20 1412  TempSrc:   PainSc: 3       Patients Stated Pain Goal: 3 (47/18/55 0158)  Complications: No complications documented.

## 2020-05-04 NOTE — Op Note (Signed)
05/04/2020  5:23 PM  PATIENT:  Kelly Mendez  44 y.o. female  MRN: [id number]  OPERATIVE REPORT  PRE-OPERATIVE DIAGNOSIS:  L5-S1 worsening disc herniation  POST-OPERATIVE DIAGNOSIS:  L5-S1 worsening disc herniation  PROCEDURE:  Procedure(s): BILATERAL LUMBAR FIVE THROUGH SACRAL ONE MICRODISCECTOMY    SURGEON:  Jessy Oto, MD     ASSISTANT: Kelly Core, PA-C  (Present throughout the entire procedure and necessary for completion of procedure in a timely manner)     ANESTHESIA:  General, supplemented with local marcaine 0.5% 1:1 exparel 1.3% total 20cc, Dr. Linna Caprice.  EBL: 50CC  DRAINS: None.    COMPLICATIONS:  None.   PROCEDURE:The patient was met in the holding area, and the appropriate lumbar level L5-S1 identified and marked with "x" and my initials.The patient was then transported to OR and was placed under general anesthesia without difficulty. The patient received appropriate preoperative antibiotic prophylaxis ancef 3 gm. The patient after intubation atraumatically was transferred to the operating room table, prone position, Jackson spine table. All pressure points were well padded. The arms in 90-90 well-padded at the elbows. Standard prep with DuraPrep solution lower dorsal spine to the mid sacral segment. Draped in the usual manner iodine Vi-Drape was used. Time-out procedure was called and correct. 2x 18-gauge spinal needle was then inserted at the expected L5-S1 level. Cross table lateral radiograph then used to identify the spinal needles positions. The  Lower needle was at the lower aspect of the lamina of L4. Skin inferior to this was then infiltrated with MARCAINE1/2% 1:1 EXPAREL 1.3% total of 20 cc used. An incision approximately an inch inch and a half in length was then made through skin and subcutaneous layers in line with the midline just superior to the spinal needle entry point. An incision made into the right and left lumbosacral fascia  approximately 3 inches in length .  Cobb elevator was then introduced into the incision site and used to carefully form subperiosteal movement of the bilateral paralumbar muscles off of the posterior lamina of the expected L5-S1 level.The depth measured off of the elavators at about 100 mm and 100 mm retractors and placed on the Veratrac equipment and guided down to and docking on the posterior aspect of the lamina at the expected L5-S1 level. Cross table lateral was used to identified a Kocher clamp placed at the appropriate level L5-S1 interspinous process area. Using the sterilely draped OR microscope the L5-S1 interspace carefully debrided the Enrique amount of muscle attachment here and high-speed bur used to drill the medial aspect of the inferior articular process of L5 approximately 10%.  3 mm Kerrison then used to enter the spinal canal over the superior aspect of the S1 lamina carefully using the Kerrison to debris the attachment as a curet. Foraminotomy was then performed over theS1 nerve root. The medial 10% superior articular process of S1 and then resected using a 80m and 2 mm Kerrison. This allowed for identification of the thecal sac.Then Using the OR microscope the right L5-S1 interspace carefully debrided the Maenza amount of muscle attachment here and high-speed bur used to drill the medial aspect of the inferior articular process of L5 approximately 10%. 2 mm Kerrison then used to enter the spinal canal over the superior aspect of the S1 lamina carefully using the Kerrison to debris the attachment as a curet. Foraminotomy was then performed over the S1 nerve root. The medial 10% superior articular process of S1 and then resected using 2 mm Kerrison.  This allowed for identification of the thecal sac.The operating room microscope sterilely draped brought into the field. The S1 nerve root was then carefully retracted medially using first a derricho retractor which did not have enough depth so that  a right angle MIS retractor was used. Bleeding from epidural veins required bipolar electrocautery and Gelfoam with cottonoids. The protruded herniated disc on the right side at L5-S1 was identified and incised with 15 blade a long handled scalpel. With this disc material extruded and the MIS pituitary rongeurs were used to debride the disc of degenerated and herniated disc material centrally. Care was taken to retract the right S1 nerve root lateral thecal sac allowing for the disc to be excised across the midline to the left side as well as right side. Up-biting pituitary rongeurs were used and the disc space debrided until no further loose fragments were found to be present. A Woodson retractor was used to examine the spinal canal and the area posterior to the L5-S1 disc space showed no significant remaining disc protrusion however a central median osteophyte appeared to be present which was left in place. The S1 and L5 neuroforamen probe freely with no sign remaining nerve compression.  Retractors were then reinserted on the left and left L5-S1 disc explored. The left L5-S1 this showed no further residual central or left-sided disc protrusion. A median osteophyte observed to be present from this left L5 S1 laminotomy. L5-S1 appeared to have been decompressed from the right side adequately. The L5 and S1 neuroforamen appeared well decompressed when probed with a Woodson and ball-tipped nerve hooks. No significant active bleeding present at the time of removal. All instruments sponge counts were correct traction system was then carefully removed carefully rotating retractors with this withdrawal and only bipolar electrocautery of any Doublin bleeders.Gelfoam foam was removed from the right and left sides and irrigation was performed. No active bleeding was present. This patient has a history of low platelet count. Subcutaneous tissue and fascia further infiltrated with MARCAINE1/2% 1:1 EXPAREL 1.3%, total amount:  20 ml. A medium Hemovac drain placed exiting out the right lower lumbar area. Lumbodorsal fascia was then carefully approximated with interrupted 0 Vicryl sutures, UR 6 needle deep subcutaneous layers were approximated with interrupted 0 Vicryl sutures on UR 6 the appear subcutaneous layers approximated with interrupted 2-0 Vicryl sutures and the skin closed with a running subcutaneous stitch of 4-0 Vicryl. Dermabond was applied allowed to dry and then Mepilex bandage applied. Patient was then carefully returned to supine position on a stretcher, reactivated and extubated. He was then returned to recovery room in satisfactory condition.  Kelly Core PA-C perform the duties of assistant surgeon during this case. He was present from the beginning of the case to the end of the case assisting in transfer the patient from his stretcher to the OR table and back to the stretcher at the end of the case. Assisted in careful retraction and suction of the laminectomy site delicate neural structures operating under the operating room microscope. He performed closure of the incision from the fascia to the skin applying the dressing.    Kelly Mendez  05/04/2020, 5:23 PM

## 2020-05-04 NOTE — Interval H&P Note (Signed)
History and Physical Interval Note:  05/04/2020 5:19 PM  Kelly Mendez  has presented today for surgery, with the diagnosis of L5-S1 worsening disc herniation.  The various methods of treatment have been discussed with the patient and family. After consideration of risks, benefits and other options for treatment, the patient has consented to  Procedure(s): BILATERAL LUMBAR FIVE THROUGH SACRAL ONE MICRODISCECTOMY (N/A) as a surgical intervention.  The patient's history has been reviewed, patient examined, no change in status, stable for surgery.  I have reviewed the patient's chart and labs.  Questions were answered to the patient's satisfaction.     Basil Dess

## 2020-05-04 NOTE — Brief Op Note (Signed)
05/04/2020  5:21 PM  PATIENT:  Kelly Mendez  44 y.o. female  PRE-OPERATIVE DIAGNOSIS:  L5-S1 worsening disc herniation  POST-OPERATIVE DIAGNOSIS:  L5-S1 worsening disc herniation  PROCEDURE:  Procedure(s): BILATERAL LUMBAR FIVE THROUGH SACRAL ONE MICRODISCECTOMY (N/A)  SURGEON:  Surgeon(s) and Role:    * Jessy Oto, MD - Primary  PHYSICIAN ASSISTANT: Benjiman Core, PA-C  ANESTHESIA:   local and general , Dr. Linna Caprice EBL:  50 mL   BLOOD ADMINISTERED:none  DRAINS: none   LOCAL MEDICATIONS USED:  MARCAINE 0.5% 1:1 EXPAREL 1.3%Amount: 20 ml  SPECIMEN:  No Specimen  DISPOSITION OF SPECIMEN:  N/A  COUNTS:  YES  TOURNIQUET:  * No tourniquets in log *  DICTATION: .Dragon Dictation  PLAN OF CARE: Admit for overnight observation  PATIENT DISPOSITION:  PACU - hemodynamically stable.   Delay start of Pharmacological VTE agent (>24hrs) due to surgical blood loss or risk of bleeding: yes

## 2020-05-04 NOTE — Anesthesia Preprocedure Evaluation (Signed)
Anesthesia Evaluation  Patient identified by MRN, date of birth, ID band Patient awake    Reviewed: Allergy & Precautions, NPO status , Patient's Chart, lab work & pertinent test results  Airway Mallampati: II  TM Distance: >3 FB Neck ROM: Full    Dental  (+) Teeth Intact, Dental Advisory Given, Loose,    Pulmonary asthma , Patient abstained from smoking., former smoker,    Pulmonary exam normal breath sounds clear to auscultation       Cardiovascular hypertension, Pt. on medications + DVT  Normal cardiovascular exam Rhythm:Regular Rate:Normal     Neuro/Psych PSYCHIATRIC DISORDERS Anxiety Depression Family history of neurofibromatosis  Neuromuscular disease (Neuropathy)    GI/Hepatic negative GI ROS, Neg liver ROS,   Endo/Other  diabetes, Type 2, Oral Hypoglycemic AgentsMorbid obesity  Renal/GU negative Renal ROS     Musculoskeletal negative musculoskeletal ROS (+)   Abdominal (+) + obese,   Peds  Hematology  (+) Blood dyscrasia (Xarelto), ,   Anesthesia Other Findings Day of surgery medications reviewed with the patient.  Right breast cancer  Reproductive/Obstetrics right ovarian cyst  abnormal uterine bleeding                             Anesthesia Physical  Anesthesia Plan  ASA: III  Anesthesia Plan: General   Post-op Pain Management:    Induction: Intravenous  PONV Risk Score and Plan: 3 and Midazolam, Dexamethasone, Ondansetron and Treatment may vary due to age or medical condition  Airway Management Planned: Oral ETT  Additional Equipment:   Intra-op Plan:   Post-operative Plan: Extubation in OR  Informed Consent: I have reviewed the patients History and Physical, chart, labs and discussed the procedure including the risks, benefits and alternatives for the proposed anesthesia with the patient or authorized representative who has indicated his/her understanding and  acceptance.     Dental advisory given  Plan Discussed with: CRNA  Anesthesia Plan Comments: (Risks/benefits of general anesthesia discussed with patient including risk of damage to teeth, lips, gum, and tongue, nausea/vomiting, allergic reactions to medications, and the possibility of heart attack, stroke and death.  All patient questions answered.  Patient wishes to proceed.)        Anesthesia Quick Evaluation

## 2020-05-05 ENCOUNTER — Encounter (HOSPITAL_COMMUNITY): Payer: Self-pay | Admitting: Specialist

## 2020-05-05 DIAGNOSIS — M5116 Intervertebral disc disorders with radiculopathy, lumbar region: Secondary | ICD-10-CM | POA: Diagnosis not present

## 2020-05-05 LAB — GLUCOSE, CAPILLARY: Glucose-Capillary: 129 mg/dL — ABNORMAL HIGH (ref 70–99)

## 2020-05-05 MED ORDER — METHOCARBAMOL 500 MG PO TABS: 500.0000 mg | ORAL_TABLET | Freq: Four times a day (QID) | ORAL | 1 refills | Status: DC | PRN

## 2020-05-05 MED ORDER — HYDROCODONE-ACETAMINOPHEN 7.5-325 MG PO TABS
1.0000 | ORAL_TABLET | Freq: Four times a day (QID) | ORAL | 0 refills | Status: AC | PRN
Start: 2020-05-05 — End: 2020-05-12

## 2020-05-05 NOTE — Evaluation (Signed)
Physical Therapy Evaluation Patient Details Name: Kelly Mendez MRN: [id number] DOB: 1976/05/28 Today's Date: 05/05/2020   History of Present Illness  Pt is a 44 y.o. F with significant PMH of asthma, right breast CA, DM 2, DVT, who presents with L5-S1 worsening disc herniation now s/p bilateral L5-S1 microdiscectomy.  Clinical Impression  Patient evaluated by Physical Therapy with no further acute PT needs identified. Pt reports improvement in pain and muscle spasms post op. Continues with numbness in LLE radiating to foot, however, intact to light touch upon assessment. Pt ambulating 400 feet with no assistive device and with cane without physical difficulty. Negotiated 10 steps with left railing and cane to simulate home set up. Education provided regarding spinal precautions for comfort and generalized walking program. All education has been completed and the patient has no further questions. No follow-up Physical Therapy or equipment needs. PT is signing off. Thank you for this referral.     Follow Up Recommendations No PT follow up    Equipment Recommendations  None recommended by PT    Recommendations for Other Services       Precautions / Restrictions Precautions Precautions: Back Precaution Booklet Issued: Yes (comment) Precaution Comments: Verbally reviewed, provided written handout Restrictions Weight Bearing Restrictions: No      Mobility  Bed Mobility Overal bed mobility: Modified Independent                  Transfers Overall transfer level: Modified independent Equipment used: None                Ambulation/Gait Ambulation/Gait assistance: Modified independent (Device/Increase time) Gait Distance (Feet): 400 Feet Assistive device: Straight cane;None Gait Pattern/deviations: Step-through pattern Gait velocity: decreased   General Gait Details: Pt with increased right foot external rotation, demonstrates good posture, and steady  pace. Used cane last ~200 ft for further stability/comfort. No gross imbalance noted.  Stairs Stairs: Yes Stairs assistance: Supervision Stair Management: One rail Left;With cane Number of Stairs: 10 General stair comments: Cues for step by step pattern and technique using cane  Wheelchair Mobility    Modified Rankin (Stroke Patients Only)       Balance Overall balance assessment: No apparent balance deficits (not formally assessed)                                           Pertinent Vitals/Pain Pain Assessment: Faces Faces Pain Scale: Hurts a little bit Pain Location: back, LLE Pain Descriptors / Indicators: Numbness;Operative site guarding Pain Intervention(s): Monitored during session    Home Living Family/patient expects to be discharged to:: Private residence Living Arrangements: Spouse/significant other;Children (73, 49 y.o. sons) Available Help at Discharge: Family Type of Home: House   Entrance Stairs-Rails: Left   Home Layout: Two level Home Equipment: Cane - single point;Shower seat      Prior Function Level of Independence: Independent with assistive device(s)         Comments: Uses cane intermittently for community ambulation, works from home, takes a class 1x/wk at [employer]        Extremity/Trunk Assessment   Upper Extremity Assessment Upper Extremity Assessment: Overall WFL for tasks assessed    Lower Extremity Assessment Lower Extremity Assessment: RLE deficits/detail;LLE deficits/detail RLE Deficits / Details: Strength 5/5 LLE Deficits / Details: Strength 5/5    Cervical / Trunk Assessment Cervical /  Trunk Assessment: Other exceptions Cervical / Trunk Exceptions: s/p bilateral L5-S1 microdiscectomy  Communication   Communication: No difficulties  Cognition Arousal/Alertness: Awake/alert Behavior During Therapy: WFL for tasks assessed/performed Overall Cognitive Status: Within Functional Limits for  tasks assessed                                        General Comments      Exercises     Assessment/Plan    PT Assessment Patent does not need any further PT services  PT Problem List         PT Treatment Interventions      PT Goals (Current goals can be found in the Care Plan section)  Acute Rehab PT Goals Patient Stated Goal: improved pain control PT Goal Formulation: All assessment and education complete, DC therapy    Frequency     Barriers to discharge        Co-evaluation               AM-PAC PT "6 Clicks" Mobility  Outcome Measure Help needed turning from your back to your side while in a flat bed without using bedrails?: None Help needed moving from lying on your back to sitting on the side of a flat bed without using bedrails?: None Help needed moving to and from a bed to a chair (including a wheelchair)?: None Help needed standing up from a chair using your arms (e.g., wheelchair or bedside chair)?: None Help needed to walk in hospital room?: None Help needed climbing 3-5 steps with a railing? : None 6 Click Score: 24    End of Session   Activity Tolerance: Patient tolerated treatment well Patient left: in bed;with call bell/phone within reach Nurse Communication: Mobility status PT Visit Diagnosis: Pain Pain - part of body:  (back)    Time: 4970-2637 PT Time Calculation (min) (ACUTE ONLY): 14 min   Charges:   PT Evaluation $PT Eval Low Complexity: [place], PT, DPT Acute Rehabilitation Services Pager [phone] Office [phone]   Deno Etienne 05/05/2020, 8:24 AM

## 2020-05-05 NOTE — Discharge Instructions (Signed)
Avoid lifting , bending , stooping. Walk daily slowly increasing distance as tolerated. OK to shower your dressing is water proof. If it comes off you can put a dry guaze dressing and tape. See Dr. Louanne Mendez in about one week. Avoid sitting except for meals and bathroom. OK to lay down or recliner position.   Call Your Doctor If Any of These Occur  Redness, drainage, or swelling at the wound.  Temperature greater than 101 degrees. Severe pain not relieved by pain medication. Incision starts to come apart.

## 2020-05-05 NOTE — Progress Notes (Signed)
° °  Subjective: 1 Day Post-Op Procedure(s) (LRB): BILATERAL LUMBAR FIVE THROUGH SACRAL ONE MICRODISCECTOMY (N/A) Patient reports pain as mild.  Leg pain and numbness, spasms gone.   Objective: Vital signs in last 24 hours: Temp:  [97.9 F (36.6 C)-98.8 F (37.1 C)] 98.8 F (37.1 C) (10/23 0524) Pulse Rate:  [84-98] 98 (10/23 0524) Resp:  [16-20] 20 (10/23 0524) BP: (134-157)/(83-102) 144/92 (10/23 0524) SpO2:  [94 %-100 %] 97 % (10/23 0524)  Intake/Output from previous day: 10/22 0701 - 10/23 0700 In: 1156.8 [I.V.:1056.8] Out: 500 [Urine:450; Blood:50] Intake/Output this shift: No intake/output data recorded.  No results for input(s): HGB in the last 72 hours. No results for input(s): WBC, RBC, HCT, PLT in the last 72 hours. No results for input(s): NA, K, CL, CO2, BUN, CREATININE, GLUCOSE, CALCIUM in the last 72 hours. No results for input(s): LABPT, INR in the last 72 hours.  Neurologically intact DG Lumbar Spine 2-3 Views  Result Date: 05/04/2020 CLINICAL DATA:  Opening films for micro discectomy EXAM: LUMBAR SPINE - 2-3 VIEW COMPARISON:  02/09/2020 FINDINGS: Three lateral views were obtained intraoperatively for location for micro discectomy. Initial film shows needles in the posterior soft tissues at the L3-4 and L4-5 interspace. Two subsequent lateral films were obtained with decreased penetration due the patient's body habitus. Instruments are noted on both films at the L5-S1 interspace. IMPRESSION: Intraoperative localization for L5-S1 discectomy as described. Critical Value/emergent results were called by telephone at the time of interpretation on 05/04/2020 at 3:42 pm to [facility] in the [facility] cone operating room, who verbally acknowledged these results and will report to Dr. Louanne Skye. Electronically Signed   By: Inez Catalina M.D.   On: 05/04/2020 15:43    Assessment/Plan: 1 Day Post-Op Procedure(s) (LRB): BILATERAL LUMBAR FIVE THROUGH SACRAL ONE MICRODISCECTOMY (N/A) Up  with therapy, ambulate this AM then discharge home.   Marybelle Killings 05/05/2020, 7:24 AM

## 2020-05-05 NOTE — Care Management (Signed)
PT recommends no PT follow up. HH orders placed prior to PT eval.

## 2020-05-05 NOTE — Evaluation (Signed)
Occupational Therapy Evaluation Patient Details Name: Kelly Mendez MRN: [id number] DOB: 1975/10/09 Today's Date: 05/05/2020    History of Present Illness Pt is a 44 y.o. F with significant PMH of asthma, right breast CA, DM 2, DVT, who presents with L5-S1 worsening disc herniation now s/p bilateral L5-S1 microdiscectomy.   Clinical Impression   This 44 y/o female presents with the above. PTA pt reports being mod independent with ADL and functional mobility. Pt currently performing ADL and mobility tasks at supervision - mod independent level. Educated pt and further reviewed back precautions, safety and compensatory techniques for completing ADL and functional transfers with pt verbalizing and/or return demonstrating understanding, requiring intermittent cues for carryover during functional tasks. Pt reports plans to return home with family assist PRN for ADL/iADL after discharge. All questions answered; pt anticipating d/c home today.    Follow Up Recommendations  No OT follow up;Supervision - Intermittent    Equipment Recommendations  None recommended by OT           Precautions / Restrictions Precautions Precautions: Back Precaution Booklet Issued: Yes (comment) Precaution Comments: pt able to recall 3/3 precautions Restrictions Weight Bearing Restrictions: No      Mobility Bed Mobility Overal bed mobility: Modified Independent             General bed mobility comments: VCs to utilize log roll technique     Transfers Overall transfer level: Modified independent Equipment used: None                  Balance Overall balance assessment: No apparent balance deficits (not formally assessed)                                         ADL either performed or assessed with clinical judgement   ADL Overall ADL's : Needs assistance/impaired Eating/Feeding: Modified independent;Sitting   Grooming:  Supervision/safety;Standing;Sitting   Upper Body Bathing: Modified independent;Sitting   Lower Body Bathing: Supervison/ safety;Sitting/lateral leans;Sit to/from stand;Cueing for compensatory techniques   Upper Body Dressing : Modified independent;Sitting Upper Body Dressing Details (indicate cue type and reason): donning overhead shirt  Lower Body Dressing: Supervision/safety;Sit to/from stand Lower Body Dressing Details (indicate cue type and reason): using figure 4 to don underwear/pants; supervision for standing balance  Toilet Transfer: Supervision/safety;Ambulation   Toileting- Clothing Manipulation and Hygiene: Supervision/safety;Sitting/lateral lean;Sit to/from Nurse, children's Details (indicate cue type and reason): educated in safe transfer techniques, pt plans to use shower seat  Functional mobility during ADLs: Supervision/safety                           Pertinent Vitals/Pain Pain Assessment: 0-10 Pain Score: 2  Faces Pain Scale: Hurts a little bit Pain Location: incisional Pain Descriptors / Indicators: Operative site guarding;Sore Pain Intervention(s): Limited activity within patient's tolerance;Monitored during session;Repositioned     Hand Dominance     Extremity/Trunk Assessment Upper Extremity Assessment Upper Extremity Assessment: Overall WFL for tasks assessed   Lower Extremity Assessment Lower Extremity Assessment: Defer to PT evaluation RLE Deficits / Details: Strength 5/5 LLE Deficits / Details: Strength 5/5   Cervical / Trunk Assessment Cervical / Trunk Assessment: Other exceptions Cervical / Trunk Exceptions: s/p bilateral L5-S1 microdiscectomy   Communication Communication Communication: No difficulties   Cognition Arousal/Alertness: Awake/alert Behavior During Therapy: WFL for tasks assessed/performed Overall  Cognitive Status: Within Functional Limits for tasks assessed                                      General Comments       Exercises     Shoulder Instructions      Home Living Family/patient expects to be discharged to:: Private residence Living Arrangements: Spouse/significant other;Children (39, 67 y.o. sons) Available Help at Discharge: Family Type of Home: House     Entrance Stairs-Rails: Left Home Layout: Two level Alternate Level Stairs-Number of Steps: 10 Alternate Level Stairs-Rails: Left Bathroom Shower/Tub: Occupational psychologist: Standard     Home Equipment: Cane - single point;Shower seat          Prior Functioning/Environment Level of Independence: Independent with assistive device(s)        Comments: Uses cane intermittently for community ambulation, works from home, takes a class 1x/wk at [employer]        OT Problem List: Decreased activity tolerance;Decreased knowledge of use of DME or AE;Decreased knowledge of precautions;Pain      OT Treatment/Interventions:      OT Goals(Current goals can be found in the care plan section) Acute Rehab OT Goals Patient Stated Goal: improved pain control OT Goal Formulation: All assessment and education complete, DC therapy  OT Frequency:     Barriers to D/C:            Co-evaluation              AM-PAC OT "6 Clicks" Daily Activity     Outcome Measure Help from another person eating meals?: None Help from another person taking care of personal grooming?: None Help from another person toileting, which includes using toliet, bedpan, or urinal?: A Little Help from another person bathing (including washing, rinsing, drying)?: A Little Help from another person to put on and taking off regular upper body clothing?: None Help from another person to put on and taking off regular lower body clothing?: A Little 6 Click Score: 21   End of Session Nurse Communication: Mobility status  Activity Tolerance: Patient tolerated treatment well Patient left: in bed;with call bell/phone within  reach  OT Visit Diagnosis: Other abnormalities of gait and mobility (R26.89);Pain Pain - part of body:  (back)                Time: 0811-0826 OT Time Calculation (min): 15 min Charges:  OT General Charges $OT Visit: 1 Visit OT Evaluation $OT Eval Low Complexity: [place], OT Acute Rehabilitation Services Pager [phone] Office [phone]   Raymondo Band 05/05/2020, 9:14 AM

## 2020-05-05 NOTE — Progress Notes (Addendum)
Patient alert and oriented, with c/o soreness at discharge. Patient stated understanding of discharge instructions given. Patient discharged with all her belongings at her side. Patient has an appointment with Dr. Louanne Skye

## 2020-05-07 ENCOUNTER — Telehealth: Payer: Self-pay | Admitting: Internal Medicine

## 2020-05-07 ENCOUNTER — Other Ambulatory Visit: Payer: Self-pay | Admitting: Internal Medicine

## 2020-05-07 ENCOUNTER — Encounter: Payer: Self-pay | Admitting: Internal Medicine

## 2020-05-07 ENCOUNTER — Encounter: Payer: Self-pay | Admitting: Family Medicine

## 2020-05-07 DIAGNOSIS — E118 Type 2 diabetes mellitus with unspecified complications: Secondary | ICD-10-CM

## 2020-05-07 DIAGNOSIS — I10 Essential (primary) hypertension: Secondary | ICD-10-CM

## 2020-05-07 MED ORDER — LOSARTAN POTASSIUM 50 MG PO TABS: 50.0000 mg | ORAL_TABLET | Freq: Every day | ORAL | 1 refills | Status: DC

## 2020-05-07 MED ORDER — DEXCOM G6 TRANSMITTER MISC: 1.0000 | 3 refills | Status: DC

## 2020-05-07 MED ORDER — DEXCOM G6 SENSOR MISC: 1.0000 | 3 refills | Status: DC

## 2020-05-07 MED FILL — Thrombin (Recombinant) For Soln 20000 Unit: CUTANEOUS | Qty: 1 | Status: AC

## 2020-05-07 NOTE — Telephone Encounter (Signed)
Pt just got out of hospital as a planned surgery. Following ortho

## 2020-05-07 NOTE — Telephone Encounter (Signed)
Spoken to patient and notified Dr Quin Hoop comments. Patient stated that she did check with the insurance prior to sending the message and ask if it okay with Dr Kelton Pillar to send the Dexcom.

## 2020-05-10 ENCOUNTER — Ambulatory Visit (INDEPENDENT_AMBULATORY_CARE_PROVIDER_SITE_OTHER): Payer: 59 | Admitting: Surgery

## 2020-05-10 ENCOUNTER — Encounter: Payer: Self-pay | Admitting: Surgery

## 2020-05-10 ENCOUNTER — Other Ambulatory Visit: Payer: Self-pay

## 2020-05-10 VITALS — BP 148/92 | HR 106 | Ht 74.0 in | Wt 332.8 lb

## 2020-05-10 DIAGNOSIS — Z9889 Other specified postprocedural states: Secondary | ICD-10-CM

## 2020-05-10 NOTE — Discharge Summary (Signed)
Patient ID: Kelly Mendez MRN: [id number] DOB/AGE: 11/03/1975 44 y.o.  Admit date: 05/04/2020 Discharge date: 05/10/2020  Admission Diagnoses:  Principal Problem:   Herniation of lumbar intervertebral disc with radiculopathy Active Problems:   Spinal stenosis of lumbar region   Lumbar disc herniation with radiculopathy   Discharge Diagnoses:  Principal Problem:   Herniation of lumbar intervertebral disc with radiculopathy Active Problems:   Spinal stenosis of lumbar region   Lumbar disc herniation with radiculopathy  status post Procedure(s): BILATERAL LUMBAR FIVE THROUGH SACRAL ONE MICRODISCECTOMY  Past Medical History:  Diagnosis Date  . Abnormal Pap smear of cervix   . Anxiety   . Asthma   . Asymptomatic microscopic hematuria 09/29/2017  . 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 Concussion    around age 73  . Diabetes type 2, uncontrolled ([place])    new diagnosis in 08/2016  . DVT (deep venous thrombosis) ([place]) 11/24/2016   in rt arm  . Family history of breast cancer   . Family history of neurofibromatosis   . Family history of ovarian cancer   . History of radiation therapy 04/21/17- 05/20/17   Right Breast 40.05 Gy in 15 fractions followed by a 10 Gy boost in 5 fractions to yield a total dose of 50.05 Gy  . HPV in female   . Hyperlipemia   . Hypertension   . Malignant neoplasm of upper-inner quadrant of right breast in female, estrogen receptor negative ([place]) 10/21/2016  . Neuromuscular disorder (HCC)    neuropathy  . Pelvic mass in female 06/16/2017  . Personal history of chemotherapy 2018  . Personal history of radiation therapy 2018  . Pneumonia    2014   . PTSD (post-traumatic stress disorder)     Surgeries: Procedure(s): BILATERAL LUMBAR FIVE THROUGH SACRAL ONE MICRODISCECTOMY on 05/04/2020   Consultants:   Discharged Condition: Improved  Hospital Course: Kelly Mendez is an 44 y.o.  female who was admitted 05/04/2020 for operative treatment of Herniation of lumbar intervertebral disc with radiculopathy. Patient failed conservative treatments (please see the history and physical for the specifics) and had severe unremitting pain that affects sleep, daily activities and work/hobbies. After pre-op clearance, the patient was taken to the operating room on 05/04/2020 and underwent  Procedure(s): BILATERAL LUMBAR FIVE THROUGH SACRAL ONE MICRODISCECTOMY.    Patient was given perioperative antibiotics:  Anti-infectives (From admission, onward)   Start     Dose/Rate Route Frequency Ordered Stop   05/05/20 0300  vancomycin (VANCOREADY) IVPB 1500 mg/300 mL        1,500 mg 150 mL/hr over 120 Minutes Intravenous  Once 05/04/20 1838 05/05/20 0359   05/04/20 0600  vancomycin (VANCOREADY) IVPB 1500 mg/300 mL        1,500 mg 150 mL/hr over 120 Minutes Intravenous On call to O.R. 05/03/20 2536 05/04/20 1458       Patient was given sequential compression devices and early ambulation to prevent DVT.   Patient benefited maximally from hospital stay and there were no complications. At the time of discharge, the patient was urinating/moving their bowels without difficulty, tolerating a regular diet, pain is controlled with oral pain medications and they have been cleared by PT/OT.   Recent vital signs: No data found.   Recent laboratory studies: No results for input(s): WBC, HGB, HCT, PLT, NA, K, CL, CO2, BUN, CREATININE, GLUCOSE, INR, CALCIUM in the last 72 hours.  Invalid input(s):  PT, 2   Discharge Medications:   Allergies as of 05/05/2020      Reactions   Lisinopril    Cough, itchy throat   Penicillins Other (See Comments)   As a child Has patient had a PCN reaction causing immediate rash, facial/tongue/throat swelling, SOB or lightheadedness with hypotension: unknown Has patient had a PCN reaction causing severe rash involving mucus membranes or skin necrosis: unknown Has  patient had a PCN reaction that required hospitalization unknown Has patient had a PCN reaction occurring within the last 10 years: no If all of the above answers are "NO", then may proceed with Cephalosporin use.      Medication List    TAKE these medications   atomoxetine 40 MG capsule Commonly known as: Strattera Take 1 capsule (40 mg total) by mouth daily.   atorvastatin 20 MG tablet Commonly known as: LIPITOR TAKE 1 TABLET (20 MG TOTAL) BY MOUTH DAILY.   baclofen 10 MG tablet Commonly known as: LIORESAL Take 0.5-1 tablets (5-10 mg total) by mouth 3 (three) times daily as needed for muscle spasms. What changed:   how much to take  when to take this   bismuth subsalicylate 629 UT/65YY suspension Commonly known as: PEPTO BISMOL Take 30 mLs by mouth every 6 (six) hours as needed for indigestion or diarrhea or loose stools.   diclofenac 50 MG tablet Commonly known as: CATAFLAM Take 1 tablet (50 mg total) by mouth 3 (three) times daily.   DULoxetine 60 MG capsule Commonly known as: Cymbalta Take 1 capsule (60 mg total) by mouth daily. Start 1 tab daily in 14 days when Effexor is at 1 tab daily   gabapentin 100 MG capsule Commonly known as: Neurontin Take 2 capsules (200 mg total) by mouth 3 (three) times daily.   HYDROcodone-acetaminophen 7.5-325 MG tablet Commonly known as: NORCO Take 1-2 tablets by mouth every 6 (six) hours as needed for up to 7 days for moderate pain.   hydrOXYzine 25 MG capsule Commonly known as: Vistaril Take 1 capsule (25 mg total) by mouth 3 (three) times daily as needed. What changed:   when to take this  additional instructions   metFORMIN 500 MG 24 hr tablet Commonly known as: GLUCOPHAGE-XR Take 2 tablets (1,000 mg total) by mouth daily with breakfast.   methocarbamol 500 MG tablet Commonly known as: ROBAXIN Take 1 tablet (500 mg total) by mouth every 6 (six) hours as needed for muscle spasms.   traZODone 50 MG tablet Commonly  known as: DESYREL Take 1 tablet (50 mg total) by mouth at bedtime. What changed:   when to take this  reasons to take this   Trulicity 1.5 TK/3.5WS Sopn Generic drug: Dulaglutide Inject 0.5 mLs (1.5 mg total) into the skin once a week. What changed: when to take this       Diagnostic Studies: DG Chest 2 View  Result Date: 05/02/2020 CLINICAL DATA:  Preoperative lumbar surgery.  Cough. EXAM: CHEST - 2 VIEW COMPARISON:  October 14, 2018 FINDINGS: Lungs are clear. The heart size and pulmonary vascularity are normal. No adenopathy. No bone lesions. IMPRESSION: Lungs clear.  Cardiac silhouette normal. Electronically Signed   By: Lowella Grip III M.D.   On: 05/02/2020 11:18   DG Lumbar Spine 2-3 Views  Result Date: 05/04/2020 CLINICAL DATA:  Opening films for micro discectomy EXAM: LUMBAR SPINE - 2-3 VIEW COMPARISON:  02/09/2020 FINDINGS: Three lateral views were obtained intraoperatively for location for micro discectomy. Initial film shows needles in  the posterior soft tissues at the L3-4 and L4-5 interspace. Two subsequent lateral films were obtained with decreased penetration due the patient's body habitus. Instruments are noted on both films at the L5-S1 interspace. IMPRESSION: Intraoperative localization for L5-S1 discectomy as described. Critical Value/emergent results were called by telephone at the time of interpretation on 05/04/2020 at 3:42 pm to [facility] in the [facility] cone operating room, who verbally acknowledged these results and will report to Dr. Louanne Skye. Electronically Signed   By: Inez Catalina M.D.   On: 05/04/2020 15:43       Follow-up Information    Jessy Oto, MD Follow up in 1 week(s).   Specialty: Orthopedic Surgery Contact information: [address] [place] [address] [phone]               Discharge Plan:  discharge to home  Disposition:     Signed: Benjiman Core  05/10/2020, 11:59 AM

## 2020-05-10 NOTE — Progress Notes (Signed)
44 year old female who is 1 week status post L5-S1 microdiscectomy returns.  States that she is doing well.  Preop symptoms greatly improved.  Currently using Tylenol, diclofenac and muscle relaxer as needed.  States that the [facility] prescription she thought she was supposed to get never made it to the pharmacy.  Exam Very pleasant female alert and oriented in no acute stress.  Wound looks good.  Staples intact.  No drainage or signs of infection.  She is neurologically intact.   Plan Patient will follow up in 1 week for wound check and possible staple removal.  She will let me know if she needs Norco sent to her pharmacy.

## 2020-05-15 ENCOUNTER — Telehealth (HOSPITAL_COMMUNITY): Payer: 59

## 2020-05-21 ENCOUNTER — Other Ambulatory Visit (HOSPITAL_COMMUNITY): Payer: Self-pay | Admitting: Psychiatry

## 2020-05-21 ENCOUNTER — Other Ambulatory Visit: Payer: Self-pay | Admitting: Radiology

## 2020-05-21 ENCOUNTER — Encounter: Payer: Self-pay | Admitting: Specialist

## 2020-05-21 DIAGNOSIS — F331 Major depressive disorder, recurrent, moderate: Secondary | ICD-10-CM

## 2020-05-21 DIAGNOSIS — F411 Generalized anxiety disorder: Secondary | ICD-10-CM

## 2020-05-21 MED ORDER — DICLOFENAC POTASSIUM 50 MG PO TABS
50.0000 mg | ORAL_TABLET | Freq: Three times a day (TID) | ORAL | 3 refills | Status: DC
Start: 2020-05-21 — End: 2021-07-05

## 2020-05-30 ENCOUNTER — Ambulatory Visit (INDEPENDENT_AMBULATORY_CARE_PROVIDER_SITE_OTHER): Payer: 59 | Admitting: Specialist

## 2020-05-30 ENCOUNTER — Other Ambulatory Visit: Payer: Self-pay

## 2020-05-30 ENCOUNTER — Encounter: Payer: Self-pay | Admitting: Specialist

## 2020-05-30 VITALS — BP 127/87 | HR 109 | Ht 74.0 in | Wt 333.0 lb

## 2020-05-30 DIAGNOSIS — Z9889 Other specified postprocedural states: Secondary | ICD-10-CM

## 2020-05-30 NOTE — Progress Notes (Signed)
Post-Op Visit Note   Patient: Kelly Mendez           Date of Birth: [birth year]           MRN: [id number] Visit Date: 05/30/2020 PCP: Denita Lung, MD   Assessment & Plan:2.5 weeks post op lumbar laminectomy L5-S1 bilateral for HNP.  Chief Complaint:  Chief Complaint  Patient presents with  . Lower Back - Routine Post Op   Visit Diagnoses:  1. Status post lumbar laminectomy    Incision with a lump like a golf ball. She is walking and has spasm when up for 1-2 hours. Taking diclofenac. Taking some tylenol.  Plan: Avoid frequent bending and stooping  No lifting greater than 10 lbs. May use ice or moist heat for pain. Weight loss is of benefit. Best medication for lumbar disc disease is arthritis medications like motrin, celebrex and naprosyn. Exercise is important to improve your indurance and does allow people to function better inspite of back pain.    Follow-Up Instructions: No follow-ups on file.   Orders:  Orders Placed This Encounter  Procedures  . Ambulatory referral to Physical Therapy   No orders of the defined types were placed in this encounter.   Imaging: No results found.  PMFS History: Patient Active Problem List   Diagnosis Date Noted  . Herniation of lumbar intervertebral disc with radiculopathy 05/04/2020    Priority: High    Class: Chronic  . Spinal stenosis of lumbar region 05/04/2020    Priority: High  . Lumbar disc herniation with radiculopathy 05/04/2020  . Vitamin D deficiency 04/27/2020  . Witnessed episode of apnea 04/27/2020  . At risk for sleep apnea 04/27/2020  . Elevated blood pressure reading in office with diagnosis of hypertension 04/27/2020  . Attention deficit hyperactivity disorder (ADHD), predominantly inattentive type 04/11/2020  . Moderate episode of recurrent major depressive disorder ([place]) 04/11/2020  . Generalized anxiety disorder   . Night sweats 10/24/2019  . Chronic right-sided low back pain  with right-sided sciatica 10/24/2019  . Fatigue 10/24/2019  . Type 2 diabetes mellitus with hyperglycemia, without long-term current use of insulin ([place]) 08/19/2019  . Hot flashes 08/13/2018  . Snoring 04/15/2018  . Post traumatic stress disorder (PTSD) 02/25/2018  . Anxiety   . Asymptomatic microscopic hematuria 09/29/2017  . Iron deficiency anemia 08/06/2017  . Adnexal mass, RIGHT 12/16/2016  . Menorrhagia 12/16/2016  . Genetic testing 12/15/2016  . Family history of breast cancer   . Family history of ovarian cancer   . Family history of neurofibromatosis   . Acute deep vein thrombosis (DVT) of axillary vein of right upper extremity ([place]) 11/26/2016  . Type 2 diabetes mellitus with diabetic polyneuropathy, without long-term current use of insulin ([place])   . Malignant neoplasm of upper-inner quadrant of right breast in female, estrogen receptor negative ([place]) 10/21/2016  . Low HDL (under 40) 09/16/2016  . ASCVD (arteriosclerotic cardiovascular disease) 09/16/2016  . Light cigarette smoker 09/15/2016  . Morbid obesity ([place]) 09/15/2016  . Mild intermittent asthma 09/15/2016   Past Medical History:  Diagnosis Date  . Abnormal Pap smear of cervix   . Anxiety   . Asthma   . Asymptomatic microscopic hematuria 09/29/2017  . 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 Concussion    around age 50  . Diabetes type 2, uncontrolled ([place])    new diagnosis in 08/2016  . DVT (deep venous thrombosis) ([place])  11/24/2016   in rt arm  . Family history of breast cancer   . Family history of neurofibromatosis   . Family history of ovarian cancer   . History of radiation therapy 04/21/17- 05/20/17   Right Breast 40.05 Gy in 15 fractions followed by a 10 Gy boost in 5 fractions to yield a total dose of 50.05 Gy  . HPV in female   . Hyperlipemia   . Hypertension   . Malignant neoplasm of upper-inner quadrant of right breast in female, estrogen receptor negative ([place])  10/21/2016  . Neuromuscular disorder (HCC)    neuropathy  . Pelvic mass in female 06/16/2017  . Personal history of chemotherapy 2018  . Personal history of radiation therapy 2018  . Pneumonia    2014   . PTSD (post-traumatic stress disorder)     Family History  Problem Relation Age of Onset  . Breast cancer Mother 40  . Hepatitis C Father   . Ovarian cancer Maternal Aunt        dx in her 37s  . Neurofibromatosis Maternal Uncle   . Brain cancer Maternal Uncle 58  . Lung cancer Maternal Grandfather   . Kidney failure Paternal Grandmother   . Heart attack Paternal Grandfather   . Ovarian cancer Maternal Aunt        dx in her 69s  . Neurofibromatosis Maternal Aunt   . Ovarian cancer Maternal Aunt   . Neurofibromatosis Maternal Aunt   . Cervical cancer Maternal Aunt   . Neurofibromatosis Maternal Aunt   . Cancer Maternal Aunt        cancer on the bottom of her foot  . Breast cancer Other        MGF's sisters  . Neurofibromatosis Other        MGM's paternal aunt    Past Surgical History:  Procedure Laterality Date  . BREAST LUMPECTOMY Right 03/18/2017  . BREAST LUMPECTOMY WITH RADIOACTIVE SEED AND SENTINEL LYMPH NODE BIOPSY Right 03/18/2017   Procedure: RIGHT BREAST LUMPECTOMY WITH RADIOACTIVE SEED AND RIGHT SENTINEL LYMPH NODE BIOPSY;  Surgeon: [name], MD;  Location: [facility];  Service: General;  Laterality: Right;  . COLPOSCOPY    . left ovary removed  1978   was told her ovary was removed but Dr. Denman George found that was not the case. Suspect this was originally an ovarian cystectomy  . LUMBAR LAMINECTOMY/DECOMPRESSION MICRODISCECTOMY N/A 05/04/2020   Procedure: BILATERAL LUMBAR FIVE THROUGH SACRAL ONE MICRODISCECTOMY;  Surgeon: Jessy Oto, MD;  Location: [facility];  Service: Orthopedics;  Laterality: N/A;  . PORTACATH PLACEMENT Right 10/29/2016   Procedure: INSERTION PORT-A-CATH WITH [place];  Surgeon: [name], MD;  Location: [facility];  Service: General;   Laterality: Right;  . portacath removal     oct. 5 2018  . ROBOTIC ASSISTED TOTAL HYSTERECTOMY Bilateral 06/16/2017   Procedure: XI ROBOTIC ASSISTED TOTAL HYSTERECTOMY WITH BILATERAL SALPINGECTOMY,  OOPHERECTOMY;  Surgeon: [name], MD;  Location: [facility];  Service: Gynecology;  Laterality: Bilateral;   Social History   Occupational History  . Not on file  Tobacco Use  . Smoking status: Former Smoker    Packs/day: 0.15    Years: 25.00    Pack years: 3.75    Types: Cigarettes    Quit date: 11/25/2016    Years since quitting: 3.5  . Smokeless tobacco: Never Used  Vaping Use  . Vaping Use: Never used  Substance and Sexual Activity  . Alcohol use: Yes  Alcohol/week: 1.0 standard drink    Types: 1 Glasses of wine per week    Comment: rarely   . Drug use: No  . Sexual activity: Not Currently    Birth control/protection: Surgical    Comment: husband vasectomy

## 2020-06-01 ENCOUNTER — Telehealth: Payer: Self-pay | Admitting: Specialist

## 2020-06-01 NOTE — Telephone Encounter (Signed)
Received $25.00 cash- medical records release form and disability paperwork from patient forwarding to [facility]

## 2020-06-04 LAB — HM DIABETES EYE EXAM

## 2020-06-11 ENCOUNTER — Ambulatory Visit (HOSPITAL_BASED_OUTPATIENT_CLINIC_OR_DEPARTMENT_OTHER): Payer: 59 | Attending: Family Medicine | Admitting: Internal Medicine

## 2020-06-11 ENCOUNTER — Encounter: Payer: Self-pay | Admitting: Family Medicine

## 2020-06-11 ENCOUNTER — Other Ambulatory Visit: Payer: Self-pay

## 2020-06-11 DIAGNOSIS — R0681 Apnea, not elsewhere classified: Secondary | ICD-10-CM | POA: Diagnosis present

## 2020-06-11 DIAGNOSIS — Z6841 Body Mass Index (BMI) 40.0 and over, adult: Secondary | ICD-10-CM | POA: Diagnosis not present

## 2020-06-11 DIAGNOSIS — Z9189 Other specified personal risk factors, not elsewhere classified: Secondary | ICD-10-CM | POA: Diagnosis not present

## 2020-06-11 DIAGNOSIS — E119 Type 2 diabetes mellitus without complications: Secondary | ICD-10-CM | POA: Insufficient documentation

## 2020-06-11 DIAGNOSIS — G4733 Obstructive sleep apnea (adult) (pediatric): Secondary | ICD-10-CM | POA: Insufficient documentation

## 2020-06-11 DIAGNOSIS — E669 Obesity, unspecified: Secondary | ICD-10-CM | POA: Insufficient documentation

## 2020-06-11 DIAGNOSIS — R0683 Snoring: Secondary | ICD-10-CM | POA: Insufficient documentation

## 2020-06-15 ENCOUNTER — Other Ambulatory Visit: Payer: Self-pay

## 2020-06-15 ENCOUNTER — Ambulatory Visit (HOSPITAL_COMMUNITY): Payer: 59 | Admitting: Licensed Clinical Social Worker

## 2020-06-16 DIAGNOSIS — Z9189 Other specified personal risk factors, not elsewhere classified: Secondary | ICD-10-CM | POA: Diagnosis not present

## 2020-06-16 NOTE — Procedures (Signed)
    Patient Name: Kelly Mendez, Kelly Mendez Date: 06/14/2020 Gender: Female D.O.B: July 11, 1976 Age (years): 34 Referring Provider: [name] NP Height (inches): 2 Interpreting Physician: Baird Lyons MD, ABSM Weight (lbs): 323 RPSGT: Gerhard Perches BMI: 43 MRN: [id number] Neck Size: 16.00  CLINICAL INFORMATION Sleep Study Type: HST Indication for sleep study: Diabetes, Fatigue, Morning Headaches, Obesity, Snoring, Witnessed Apneas Epworth Sleepiness Score: 10  Most recent polysomnogram dated 05/19/2018 revealed an AHI of 2.6/h and RDI of 8.8/h.  SLEEP STUDY TECHNIQUE A multi-channel overnight portable sleep study was performed. The channels recorded were: nasal airflow, thoracic respiratory movement, and oxygen saturation with a pulse oximetry. Snoring was also monitored.  MEDICATIONS Patient self administered medications include: none reported.  SLEEP ARCHITECTURE Patient was studied for 360.2 minutes. The sleep efficiency was 99.9 % and the patient was supine for 81.1%. The arousal index was 0.0 per hour.  RESPIRATORY PARAMETERS The overall AHI was 5.5 per hour, with a central apnea index of 0.0 per hour. The oxygen nadir was 88% during sleep.  CARDIAC DATA Mean heart rate during sleep was 99.5 bpm.  IMPRESSIONS - Mild obstructive sleep apnea occurred during this study (AHI = 5.5/h). - No significant central sleep apnea occurred during this study (CAI = 0.0/h). - Mild oxygen desaturation was noted during this study (Min O2 = 88%). Mean O2 sat 94%. - Patient snored . DIAGNOSIS - Obstructive Sleep Apnea (G47.33)  RECOMMENDATIONS - Treatment for minimal OSA is directed at symptoms. Conservative measures may include observation, weight loss and sleep position off back. Other options, including CPAP, a fitted oral appliance, or ENT evaluation, would be based on clinical judgment. - Be careful  with alcohol, sedatives and other CNS depressants that may worsen sleep  apnea and disrupt normal sleep architecture. - Sleep hygiene should be reviewed to assess factors that may improve sleep quality. - Weight management and regular exercise should be initiated or continued.  [Electronically signed] 06/16/2020 11:16 AM  Baird Lyons MD, ABSM Diplomate, American Board of Sleep Medicine   NPI: [id number]                         [place] [place]  ELECTRONICALLY SIGNED ON:  06/16/2020, 11:11 AM [place] PH: [phone]   FX: [phone] [place]

## 2020-06-17 NOTE — Progress Notes (Signed)
Please schedule a virtual to discuss her sleep study results and treatment options.

## 2020-06-18 ENCOUNTER — Other Ambulatory Visit (HOSPITAL_COMMUNITY): Payer: Self-pay | Admitting: Psychiatry

## 2020-06-18 ENCOUNTER — Other Ambulatory Visit: Payer: Self-pay | Admitting: Family Medicine

## 2020-06-18 DIAGNOSIS — F411 Generalized anxiety disorder: Secondary | ICD-10-CM

## 2020-06-18 DIAGNOSIS — F331 Major depressive disorder, recurrent, moderate: Secondary | ICD-10-CM

## 2020-06-19 ENCOUNTER — Other Ambulatory Visit: Payer: Self-pay

## 2020-06-19 ENCOUNTER — Telehealth (INDEPENDENT_AMBULATORY_CARE_PROVIDER_SITE_OTHER): Payer: 59 | Admitting: Family Medicine

## 2020-06-19 ENCOUNTER — Encounter: Payer: Self-pay | Admitting: Family Medicine

## 2020-06-19 VITALS — Wt 320.0 lb

## 2020-06-19 DIAGNOSIS — G4700 Insomnia, unspecified: Secondary | ICD-10-CM | POA: Insufficient documentation

## 2020-06-19 DIAGNOSIS — Z6841 Body Mass Index (BMI) 40.0 and over, adult: Secondary | ICD-10-CM

## 2020-06-19 DIAGNOSIS — G4733 Obstructive sleep apnea (adult) (pediatric): Secondary | ICD-10-CM

## 2020-06-19 NOTE — Progress Notes (Signed)
   Subjective:  Documentation for virtual audio and video telecommunications through [place] encounter:  The patient was located at home. 2 patient identifiers used.  The provider was located in the office. The patient did consent to this visit and is aware of possible charges through their insurance for this visit.  The other persons participating in this telemedicine service were none. Time spent on call was 8 minutes and in review of previous records and ordering modalities 15 minutes total.  This virtual service is not related to other E/M service within previous 7 days.   Patient ID: Kelly Mendez, female    DOB: 09-Mar-1976, 44 y.o.   MRN: [id number]  HPI Chief Complaint  Patient presents with  . discuss sleep study    discuss sleep study and treatment options   This is a virtual visit to discuss recent sleep study results.  Discussed good sleep hygiene and recommended conservative measures for mild OSA including sleep position off back, avoiding anything sedating before sleep and counseling on weight loss to help with sleep apnea. We also discussed the option for CPAP and she prefers to try this.  States she occasionally takes trazodone because she has been dealing with insomnia.  States she did not take it the night of the study.  Recent back surgery and reports much improvement.  Sleep study report below  IMPRESSIONS - Mild obstructive sleep apnea occurred during this study (AHI = 5.5/h). - No significant central sleep apnea occurred during this study (CAI = 0.0/h). - Mild oxygen desaturation was noted during this study (Min O2 = 88%). Mean O2 sat 94%. - Patient snored . DIAGNOSIS - Obstructive Sleep Apnea (G47.33)  RECOMMENDATIONS - Treatment for minimal OSA is directed at symptoms. Conservative measures may include observation, weight loss and sleep position off back. Other options, including CPAP, a fitted oral appliance, or ENT evaluation, would  be based on clinical judgment. - Be careful  with alcohol, sedatives and other CNS depressants that may worsen sleep apnea and disrupt normal sleep architecture. - Sleep hygiene should be reviewed to assess factors that may improve sleep quality. - Weight management and regular exercise should be initiated or continued.  [Electronically signed] 06/16/2020 11:16 AM  Baird Lyons MD, ABSM Diplomate, American Board of Sleep Medicine   Review of Systems Pertinent positives and negatives in the history of present illness.     Objective:   Physical Exam Wt (!) 320 lb (145.2 kg)   LMP 01/10/2017   BMI 41.09 kg/m   Alert and oriented and in no acute distress.  Respirations unlabored.  Normal speech, mood and thought process.      Assessment & Plan:  OSA (obstructive sleep apnea)  Insomnia, unspecified type  Morbid obesity ([place])  Reviewed sleep study results with patient.  Counseling done all conservative measures to help but she would like to try the CPAP.  I am fine with this and think she would benefit from it.  I will order a CPAP for her and she will follow-up with me. She has been prescribed trazodone for recent insomnia and takes this as needed.  Discussed limiting trazodone until she has her CPAP. Counseling on weight loss to help with her sleep apnea.

## 2020-06-19 NOTE — Addendum Note (Signed)
Addended by: Minette Headland A on: 06/19/2020 11:03 AM   Modules accepted: Orders

## 2020-06-20 ENCOUNTER — Ambulatory Visit: Payer: 59 | Admitting: Physical Therapy

## 2020-06-21 ENCOUNTER — Other Ambulatory Visit: Payer: Self-pay

## 2020-06-21 ENCOUNTER — Ambulatory Visit: Payer: 59 | Attending: Specialist | Admitting: Physical Therapy

## 2020-06-21 ENCOUNTER — Encounter: Payer: Self-pay | Admitting: Physical Therapy

## 2020-06-21 DIAGNOSIS — R252 Cramp and spasm: Secondary | ICD-10-CM | POA: Insufficient documentation

## 2020-06-21 DIAGNOSIS — M6281 Muscle weakness (generalized): Secondary | ICD-10-CM | POA: Insufficient documentation

## 2020-06-21 DIAGNOSIS — R262 Difficulty in walking, not elsewhere classified: Secondary | ICD-10-CM | POA: Insufficient documentation

## 2020-06-21 DIAGNOSIS — M5441 Lumbago with sciatica, right side: Secondary | ICD-10-CM | POA: Diagnosis present

## 2020-06-21 NOTE — Patient Instructions (Signed)
Access Code: [email or web address] URL: [email or web address] Date: 06/21/2020 Prepared by: Amador Cunas  Exercises Supine Posterior Pelvic Tilt - 1 x daily - 7 x weekly - 3 sets - 10 reps - 3 sec hold Supine Figure 4 Piriformis Stretch - 1 x daily - 7 x weekly - 3 sets - 10 reps Active Straight Leg Raise with Quad Set - 1 x daily - 7 x weekly - 3 sets - 10 reps Standing Shoulder Row with Anchored Resistance - 1 x daily - 7 x weekly - 3 sets - 10 reps - 3 sec hold Shoulder Extension with Resistance - 1 x daily - 7 x weekly - 3 sets - 10 reps Shoulder External Rotation and Scapular Retraction with Resistance - 1 x daily - 7 x weekly - 3 sets - 10 reps

## 2020-06-21 NOTE — Therapy (Signed)
[facility]. [place], [name], [address] Phone: [phone]   Fax:  [phone]  Physical Therapy Evaluation  Patient Details  Name: Kelly Mendez MRN: [id number] Date of Birth: 02-05-1976 Referring Provider (PT): Louanne Skye   Encounter Date: 06/21/2020   PT End of Session - 06/21/20 1523    Visit Number 1    Date for PT Re-Evaluation 08/22/20    PT Start Time 1444    PT Stop Time 1522    PT Time Calculation (min) 38 min    Activity Tolerance Patient tolerated treatment well    Behavior During Therapy [facility] for tasks assessed/performed           Past Medical History:  Diagnosis Date  . Abnormal Pap smear of cervix   . Anxiety   . Asthma   . Asymptomatic microscopic hematuria 09/29/2017  . 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 Concussion    around age 73  . Diabetes type 2, uncontrolled ([place])    new diagnosis in 08/2016  . DVT (deep venous thrombosis) ([place]) 11/24/2016   in rt arm  . Family history of breast cancer   . Family history of neurofibromatosis   . Family history of ovarian cancer   . History of radiation therapy 04/21/17- 05/20/17   Right Breast 40.05 Gy in 15 fractions followed by a 10 Gy boost in 5 fractions to yield a total dose of 50.05 Gy  . HPV in female   . Hyperlipemia   . Hypertension   . Malignant neoplasm of upper-inner quadrant of right breast in female, estrogen receptor negative ([place]) 10/21/2016  . Neuromuscular disorder (HCC)    neuropathy  . Pelvic mass in female 06/16/2017  . Personal history of chemotherapy 2018  . Personal history of radiation therapy 2018  . Pneumonia    2014   . PTSD (post-traumatic stress disorder)     Past Surgical History:  Procedure Laterality Date  . BREAST LUMPECTOMY Right 03/18/2017  . BREAST LUMPECTOMY WITH RADIOACTIVE SEED AND SENTINEL LYMPH NODE BIOPSY Right 03/18/2017   Procedure: RIGHT BREAST  LUMPECTOMY WITH RADIOACTIVE SEED AND RIGHT SENTINEL LYMPH NODE BIOPSY;  Surgeon: [name], MD;  Location: [place];  Service: General;  Laterality: Right;  . COLPOSCOPY    . left ovary removed  1978   was told her ovary was removed but Dr. Denman George found that was not the case. Suspect this was originally an ovarian cystectomy  . LUMBAR LAMINECTOMY/DECOMPRESSION MICRODISCECTOMY N/A 05/04/2020   Procedure: BILATERAL LUMBAR FIVE THROUGH SACRAL ONE MICRODISCECTOMY;  Surgeon: Jessy Oto, MD;  Location: [place];  Service: Orthopedics;  Laterality: N/A;  . PORTACATH PLACEMENT Right 10/29/2016   Procedure: INSERTION PORT-A-CATH WITH [place];  Surgeon: [name], MD;  Location: [place];  Service: General;  Laterality: Right;  . portacath removal     oct. 5 2018  . ROBOTIC ASSISTED TOTAL HYSTERECTOMY Bilateral 06/16/2017   Procedure: XI ROBOTIC ASSISTED TOTAL HYSTERECTOMY WITH BILATERAL SALPINGECTOMY,  OOPHERECTOMY;  Surgeon: [name], MD;  [facility];  Service: Gynecology;  Laterality: Bilateral;    There were no vitals filed for this visit.    Subjective Assessment - 06/21/20 1444    Subjective Pt presents to clinic s/p L5-S1 lumbar laminectomy 05/09/20. Pt reports that not painfree but pain levels are much better than before the surgery. Pt reports that reports that RLE  radiating pain is much better; only experiences intermittently. Pt experiencing occasional muscle spasms in LB. Pt primary complaints are fatigue from walking/standing; reports she can walk longer than she can stand. Ambulates into clinic with [facility]; states that she needs the cane to support upright posture. Pt would like to return to walking without SPC. Pt reports she is on modified back precautions; no excessive bending, lifting, or twisting.    Pertinent History HTN    Limitations Standing;Walking;House hold activities    How long can you stand comfortably? 3 minutes    How long can you walk comfortably?  5-10 minutes    Diagnostic tests xrays    Patient Stated Goals walk without SPC, strengthen LB and posture muscles    Currently in Pain? Yes    Pain Score 2     Pain Location Back    Pain Orientation Lower    Pain Descriptors / Indicators Aching;Dull    Pain Type Surgical pain;Chronic pain    Pain Radiating Towards RLE    Pain Onset More than a month ago    Pain Frequency Intermittent    Aggravating Factors  prolonged standing, walking    Pain Relieving Factors rest, heat              [facility] PT Assessment - 06/21/20 0001      Assessment   Medical Diagnosis L5-S1 lumbar laminectomy    Referring Provider (PT) Louanne Skye    Onset Date/Surgical Date 05/09/20    Next MD Visit 07/02/20    Prior Therapy PT for LBP      Precautions   Precautions Back    Precaution Comments no excessive bending/twisting; no lifting >10 lbs      Restrictions   Weight Bearing Restrictions No      Balance Screen   Has the patient fallen in the past 6 months Yes    How many times? 1    Has the patient had a decrease in activity level because of a fear of falling?  No    Is the patient reluctant to leave their home because of a fear of falling?  No      Home Environment   Additional Comments stairs to get to bedroom; reports still doing step to pattern      Prior Function   Level of Independence Independent    Vocation Full time employment    Vocation Requirements remote customer service work    Leisure Occupational hygienist Intact      Functional Tests   Functional tests Sit to Stand      Sit to Stand   Comments leaning to LLE; use of UEs to control descent      Posture/Postural Control   Posture/Postural Control Postural limitations    Postural Limitations Rounded Shoulders;Forward head    Posture Comments difficulty maintaining upright posture for extended periods      ROM / Strength   AROM / PROM / Strength AROM;Strength      AROM   Overall AROM Comments  lumbar AROM 50% limited and painful; R hip IR limited and painful      Strength   Overall Strength Comments LLE 5/5, RLE 4+/5      Flexibility   Soft Tissue Assessment /Muscle Length yes    Hamstrings WFL    Piriformis tight on R      Ambulation/Gait   Gait Comments walks with SPC, antalgic gait with leaning  to LLE                      Objective measurements completed on examination: See above findings.       [facility] Adult PT Treatment/Exercise - 06/21/20 0001      Exercises   Exercises Lumbar      Lumbar Exercises: Stretches   Pelvic Tilt 10 reps;5 seconds    Piriformis Stretch Right;Left;10 seconds;1 rep;30 seconds      Lumbar Exercises: Standing   Scapular Retraction Both    Theraband Level (Scapular Retraction) Level 3 (Green)    Scapular Retraction Limitations with ER    Row Both;10 reps    Theraband Level (Row) Level 3 (Green)    Shoulder Extension Both;10 reps    Theraband Level (Shoulder Extension) Level 3 (Green)      Lumbar Exercises: Supine   Straight Leg Raise 10 reps;3 seconds                  PT Education - 06/21/20 1523    Education Details Pt educated on POC and HEP    Person(s) Educated Patient    Methods Explanation;Demonstration;Handout    Comprehension Verbalized understanding;Returned demonstration            PT Short Term Goals - 06/21/20 1528      PT SHORT TERM GOAL #1   Title Pt will be I with initial HEP    Time 2    Period Weeks    Status New    Target Date 07/05/20             PT Long Term Goals - 06/21/20 1528      PT LONG TERM GOAL #1   Title independent with HEP    Time 6    Period Weeks    Status New    Target Date 08/02/20      PT LONG TERM GOAL #2   Title negotiate stairs reciprocially without increase in pain for improved function    Time 6    Period Weeks    Status New    Target Date 08/02/20      PT LONG TERM GOAL #3   Title Pt will demo lumbar ROM WFL with </= 1/10 LBP    Time 6     Period Weeks    Status New    Target Date 08/02/20      PT LONG TERM GOAL #4   Title Pt will report able to stand/walk >30 min with </= 1/10 LBP    Time 6    Period Weeks    Status New    Target Date 08/02/20                  Plan - 06/21/20 1524    Clinical Impression Statement Pt presents to clinic s/p L5-S1 lumbar laminectomy on 05/09/20. Pt precautions no excessive bending, twisting, and no lifting >10 lbs. Pt has follow up with MD Monday, 06/25/20. Pt demos decreased RLE strength, limitations in R hip IR ROM, and postural/core strength deficits. Pt ambulates with SPC; reports this is d/t difficulty maintaining upright posture without it. Pt would like to return to PLOF of ambulation without SPC, able to sit at desk without LBP to work from home, and walking. Pt would benefit from skilled PT to address the above impairments.    Personal Factors and Comorbidities Comorbidity 1    Comorbidities HTN    Examination-Activity Limitations Locomotion Level;Stairs;Stand;Bend  Examination-Participation Restrictions Community Activity;Interpersonal Relationship;Occupation    Stability/Clinical Decision Making Stable/Uncomplicated    Clinical Decision Making Low    Rehab Potential Good    PT Frequency 2x / week    PT Duration 6 weeks    PT Treatment/Interventions ADLs/Self Care Home Management;Electrical Stimulation;Iontophoresis 4mg /ml Dexamethasone;Moist Heat;Neuromuscular re-education;Therapeutic exercise;Therapeutic activities;Stair training;Gait training;Patient/family education;Manual techniques    PT Next Visit Plan get pt moving, slowly introduce gym activities, core stab, postural ex's, gait training no AD    PT Home Exercise Plan see pt instructions    Consulted and Agree with Plan of Care Patient           Patient will benefit from skilled therapeutic intervention in order to improve the following deficits and impairments:  Abnormal gait,Decreased range of  motion,Difficulty walking,Decreased endurance,Increased muscle spasms,Decreased activity tolerance,Pain,Hypomobility,Impaired flexibility,Decreased strength,Postural dysfunction  Visit Diagnosis: Acute right-sided low back pain with right-sided sciatica  Muscle weakness (generalized)  Cramp and spasm  Difficulty in walking, not elsewhere classified     Problem List Patient Active Problem List   Diagnosis Date Noted  . OSA (obstructive sleep apnea) 06/19/2020  . Insomnia 06/19/2020  . Herniation of lumbar intervertebral disc with radiculopathy 05/04/2020    Class: Chronic  . Spinal stenosis of lumbar region 05/04/2020  . Lumbar disc herniation with radiculopathy 05/04/2020  . Vitamin D deficiency 04/27/2020  . Witnessed episode of apnea 04/27/2020  . At risk for sleep apnea 04/27/2020  . Elevated blood pressure reading in office with diagnosis of hypertension 04/27/2020  . Attention deficit hyperactivity disorder (ADHD), predominantly inattentive type 04/11/2020  . Moderate episode of recurrent major depressive disorder ([place]) 04/11/2020  . Generalized anxiety disorder   . Night sweats 10/24/2019  . Chronic right-sided low back pain with right-sided sciatica 10/24/2019  . Fatigue 10/24/2019  . Type 2 diabetes mellitus with hyperglycemia, without long-term current use of insulin ([place]) 08/19/2019  . Hot flashes 08/13/2018  . Snoring 04/15/2018  . Post traumatic stress disorder (PTSD) 02/25/2018  . Anxiety   . Asymptomatic microscopic hematuria 09/29/2017  . Iron deficiency anemia 08/06/2017  . Adnexal mass, RIGHT 12/16/2016  . Menorrhagia 12/16/2016  . Genetic testing 12/15/2016  . Family history of breast cancer   . Family history of ovarian cancer   . Family history of neurofibromatosis   . Acute deep vein thrombosis (DVT) of axillary vein of right upper extremity ([place]) 11/26/2016  . Type 2 diabetes mellitus with diabetic polyneuropathy, without long-term current use of  insulin ([place])   . Malignant neoplasm of upper-inner quadrant of right breast in female, estrogen receptor negative ([place]) 10/21/2016  . Low HDL (under 40) 09/16/2016  . ASCVD (arteriosclerotic cardiovascular disease) 09/16/2016  . Light cigarette smoker 09/15/2016  . Morbid obesity ([place]) 09/15/2016  . Mild intermittent asthma 09/15/2016   Amador Cunas, PT, DPT Donald Prose Jamice Carreno 06/21/2020, 3:30 PM  [place]. [place], [name], [address] Phone: [phone]   Fax:  [phone]  Name: Kelly Mendez MRN: [id number] Date of Birth: [birth year]

## 2020-06-25 ENCOUNTER — Ambulatory Visit (INDEPENDENT_AMBULATORY_CARE_PROVIDER_SITE_OTHER): Payer: 59 | Admitting: Internal Medicine

## 2020-06-25 ENCOUNTER — Other Ambulatory Visit: Payer: Self-pay

## 2020-06-25 VITALS — BP 134/84 | HR 95 | Ht 74.0 in | Wt 333.2 lb

## 2020-06-25 DIAGNOSIS — E1142 Type 2 diabetes mellitus with diabetic polyneuropathy: Secondary | ICD-10-CM | POA: Diagnosis not present

## 2020-06-25 LAB — GLUCOSE, POCT (MANUAL RESULT ENTRY): POC Glucose: 136 mg/dl — AB (ref 70–99)

## 2020-06-25 MED ORDER — METFORMIN HCL ER 500 MG PO TB24
1000.0000 mg | ORAL_TABLET | Freq: Two times a day (BID) | ORAL | 3 refills | Status: DC
Start: 2020-06-25 — End: 2020-07-12

## 2020-06-25 NOTE — Patient Instructions (Signed)
°-   Increase  Metformin 500 mg XR, 2 tablet with Breakfast and 2 tablet with supper  - Continue  trulicity to 1.5 mg weekly     HOW TO TREAT LOW BLOOD SUGARS (Blood sugar LESS THAN 70 MG/DL)  Please follow the RULE OF 15 for the treatment of hypoglycemia treatment (when your (blood sugars are less than 70 mg/dL)    STEP 1: Take 15 grams of carbohydrates when your blood sugar is low, which includes:   3-4 GLUCOSE TABS  OR  3-4 OZ OF JUICE OR REGULAR SODA OR  ONE TUBE OF GLUCOSE GEL     STEP 2: RECHECK blood sugar in 15 MINUTES STEP 3: If your blood sugar is still low at the 15 minute recheck --> then, go back to STEP 1 and treat AGAIN with another 15 grams of carbohydrates.

## 2020-06-25 NOTE — Progress Notes (Signed)
Name: Kelly Mendez  Age/ Sex: 44 y.o., female   MRN/ DOB: [id number], July 25, 1975     PCP: Denita Lung, MD   Reason for Endocrinology Evaluation: Type 2 Diabetes Mellitus     Initial Endocrinology Clinic Visit: 09/03/2018    PATIENT IDENTIFIER: Kelly Mendez is a 44 y.o. female with a past medical history of Right breast ca (03/19/2017 S/P lumpectomy, chemo and radiation) and T2DM. The patient has followed with Endocrinology clinic since 09/03/2018 for consultative assistance with management of her diabetes.  DIABETIC HISTORY:  [name] was diagnosed with T2DM in 2018. She has been on metformin since diagnosis, trulicity added in 5597 , she has not been on insulin. Her hemoglobin A1c has ranged from 6.7% in 2019, peaking at 8.8% in 2018.    SUBJECTIVE:   During the last visit (12/19/2019): A1c 5.9 %. Decreased  Metformin and continued Trulicity    Today (41/63/8453): [name] is here for a follow up on her diabetes management.  She has been checking her sugar multiple x/ day. She patient has  had hypoglycemic episodes since the last clinic visit, occurring sporadically but has noted CGM discrepency with her numbers.    She had back sx 04/2020. She took steroids for ~ 1 weeks  Stopped doing weight watchers ~ 3 months ago    HOME DIABETES REGIMEN:   Metformin 500 mg 2 tabs  BID    Trulicity 1.5 mg weekly   Statin: yes ACE-I/ARB: no    CONTINUOUS GLUCOSE MONITORING RECORD INTERPRETATION : Unable to download today         DIABETIC COMPLICATIONS: Microvascular complications:   Neuropathy   Denies: CKD, retinopathy  Last eye exam: Completed 2018  Macrovascular complications:    Denies: CAD, PVD, CVA     PHYSICAL EXAM: VS: BP 134/84   Pulse 95   Ht 6\' 2"  (1.88 m)   Wt (!) 333 lb 4 oz (151.2 kg)   LMP 01/10/2017   SpO2 98%   BMI 42.79 kg/m    EXAM: General: Pt appears well and is in NAD  Lungs: Clear with  good BS bilat with no rales, rhonchi, or wheezes  Abdomen: Normoactive bowel sounds, soft, nontender, without masses or organomegaly palpable  Extremities:  BL LE: no pretibial edema normal   Mental Status: Judgment, insight: intact Mood and affect: no depression, anxiety, or agitation   DM Foot exam 06/25/2020 The skin of the feet is without sores or ulcerations, right 1st MTH callous formation  The pedal pulses are 2+ on right and 2+ on left. The sensation is decreased to a screening 5.07, 10 gram monofilament on the right       HISTORY:  Past Medical History:  Past Medical History:  Diagnosis Date  . Abnormal Pap smear of cervix   . Anxiety   . Asthma   . Asymptomatic microscopic hematuria 09/29/2017  . 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 Concussion    around age 57  . Diabetes type 2, uncontrolled ([place])    new diagnosis in 08/2016  . DVT (deep venous thrombosis) ([place]) 11/24/2016   in rt arm  . Family history of breast cancer   . Family history of neurofibromatosis   . Family history of ovarian cancer   . History of radiation therapy 04/21/17- 05/20/17   Right Breast 40.05 Gy in 15 fractions followed by a 10  Gy boost in 5 fractions to yield a total dose of 50.05 Gy  . HPV in female   . Hyperlipemia   . Hypertension   . Malignant neoplasm of upper-inner quadrant of right breast in female, estrogen receptor negative ([place]) 10/21/2016  . Neuromuscular disorder (HCC)    neuropathy  . Pelvic mass in female 06/16/2017  . Personal history of chemotherapy 2018  . Personal history of radiation therapy 2018  . Pneumonia    2014   . PTSD (post-traumatic stress disorder)    Past Surgical History:  Past Surgical History:  Procedure Laterality Date  . BREAST LUMPECTOMY Right 03/18/2017  . BREAST LUMPECTOMY WITH RADIOACTIVE SEED AND SENTINEL LYMPH NODE BIOPSY Right 03/18/2017   Procedure: RIGHT BREAST LUMPECTOMY WITH RADIOACTIVE SEED AND RIGHT  SENTINEL LYMPH NODE BIOPSY;  Surgeon: [name], MD;  Location: [place];  Service: General;  Laterality: Right;  . COLPOSCOPY    . left ovary removed  1978   was told her ovary was removed but Dr. Denman George found that was not the case. Suspect this was originally an ovarian cystectomy  . LUMBAR LAMINECTOMY/DECOMPRESSION MICRODISCECTOMY N/A 05/04/2020   Procedure: BILATERAL LUMBAR FIVE THROUGH SACRAL ONE MICRODISCECTOMY;  Surgeon: Jessy Oto, MD;  Location: [place];  Service: Orthopedics;  Laterality: N/A;  . PORTACATH PLACEMENT Right 10/29/2016   Procedure: INSERTION PORT-A-CATH WITH [place];  Surgeon: [name], MD;  Location: [place];  Service: General;  Laterality: Right;  . portacath removal     oct. 5 2018  . ROBOTIC ASSISTED TOTAL HYSTERECTOMY Bilateral 06/16/2017   Procedure: XI ROBOTIC ASSISTED TOTAL HYSTERECTOMY WITH BILATERAL SALPINGECTOMY,  OOPHERECTOMY;  Surgeon: [name], MD;  [facility];  Service: Gynecology;  Laterality: Bilateral;    Social History:  reports that she quit smoking about 3 years ago. Her smoking use included cigarettes. She has a 3.75 pack-year smoking history. She has never used smokeless tobacco. She reports current alcohol use of about 1.0 standard drink of alcohol per week. She reports that she does not use drugs. Family History:  Family History  Problem Relation Age of Onset  . Breast cancer Mother 1  . Hepatitis C Father   . Ovarian cancer Maternal Aunt        dx in her 65s  . Neurofibromatosis Maternal Uncle   . Brain cancer Maternal Uncle 58  . Lung cancer Maternal Grandfather   . Kidney failure Paternal Grandmother   . Heart attack Paternal Grandfather   . Ovarian cancer Maternal Aunt        dx in her 8s  . Neurofibromatosis Maternal Aunt   . Ovarian cancer Maternal Aunt   . Neurofibromatosis Maternal Aunt   . Cervical cancer Maternal Aunt   . Neurofibromatosis Maternal Aunt   . Cancer Maternal Aunt        cancer  on the bottom of her foot  . Breast cancer Other        MGF's sisters  . Neurofibromatosis Other        MGM's paternal aunt     HOME MEDICATIONS: Allergies as of 06/25/2020      Reactions   Lisinopril    Cough, itchy throat   Penicillins Other (See Comments)   As a child Has patient had a PCN reaction causing immediate rash, facial/tongue/throat swelling, SOB or lightheadedness with hypotension: unknown Has patient had a PCN reaction causing severe rash involving mucus membranes or skin necrosis: unknown Has patient had a PCN  reaction that required hospitalization unknown Has patient had a PCN reaction occurring within the last 10 years: no If all of the above answers are "NO", then may proceed with Cephalosporin use.      Medication List       Accurate as of June 25, 2020  9:29 AM. If you have any questions, ask your nurse or doctor.        atomoxetine 40 MG capsule Commonly known as: Strattera Take 1 capsule (40 mg total) by mouth daily.   atorvastatin 20 MG tablet Commonly known as: LIPITOR TAKE 1 TABLET (20 MG TOTAL) BY MOUTH DAILY.   Dexcom G6 Sensor Misc 1 Device by Does not apply route as directed.   Dexcom G6 Transmitter Misc 1 Device by Does not apply route as directed.   diclofenac 50 MG tablet Commonly known as: CATAFLAM Take 1 tablet (50 mg total) by mouth 3 (three) times daily.   DULoxetine 60 MG capsule Commonly known as: CYMBALTA TAKE 1 CAPSULE (60 MG TOTAL) BY MOUTH DAILY. START 1 CAPSULE DAILY IN 14 DAYS WHEN EFFEXOR IS AT 1 TAB DAILY   gabapentin 100 MG capsule Commonly known as: Neurontin Take 2 capsules (200 mg total) by mouth 3 (three) times daily.   hydrOXYzine 25 MG capsule Commonly known as: VISTARIL TAKE 1 CAPSULE (25 MG TOTAL) BY MOUTH 3 (THREE) TIMES DAILY AS NEEDED.   losartan 50 MG tablet Commonly known as: COZAAR Take 1 tablet (50 mg total) by mouth daily.   metFORMIN 500 MG 24 hr tablet Commonly known as:  GLUCOPHAGE-XR Take 2 tablets (1,000 mg total) by mouth daily with breakfast.   methocarbamol 500 MG tablet Commonly known as: ROBAXIN Take 1 tablet (500 mg total) by mouth every 6 (six) hours as needed for muscle spasms.   traZODone 50 MG tablet Commonly known as: DESYREL Take 1 tablet (50 mg total) by mouth at bedtime. What changed:   when to take this  reasons to take this   Trulicity 1.5 FU/9.3AT Sopn Generic drug: Dulaglutide Inject 0.5 mLs (1.5 mg total) into the skin once a week. What changed: when to take this         DATA REVIEWED:  Lab Results  Component Value Date   HGBA1C 7.0 (H) 05/01/2020   HGBA1C 5.9 (A) 12/19/2019   HGBA1C 7.5 (A) 08/19/2019   Lab Results  Component Value Date   MICROALBUR 4.5 09/15/2016   LDLCALC 62 04/27/2020   CREATININE 0.70 05/01/2020   Lab Results  Component Value Date   MICRALBCREAT 21 08/13/2018     Lab Results  Component Value Date   CHOL 109 04/27/2020   HDL 30 (L) 04/27/2020   LDLCALC 62 04/27/2020   TRIG 85 04/27/2020   CHOLHDL 3.6 04/27/2020         ASSESSMENT / PLAN / RECOMMENDATIONS:   1) Type 2 Diabetes Mellitus, Optimally controlled, With neuropathic complications - Most recent A1c of 7.0 %. Goal A1c <7.0 %.  - A1c up from 5.9 % . She had back sx and was more sedentary , weight watchers was cost prohibitive  - She had already increased Metformin , will continue   MEDICATIONS:  Continue Metformin 500 mg, 2 tabs BID   Continue Trulicity 1.5 mg weekly  EDUCATION / INSTRUCTIONS:  BG monitoring instructions: Patient is instructed to check her blood sugars 2 times a day, fasting and bedtime.  Call [facility] if: BG persistently < 70  . I reviewed the Rule  of 15 for the treatment of hypoglycemia in detail with the patient. Literature supplied.     2) Diabetic complications:   Eye: Does not have known diabetic retinopathy.  Patient encouraged to have an eye exam ASAP  Neuro/  Feet: Does have known diabetic peripheral neuropathy .   Renal: Patient does not have known baseline CKD. She   is not on an ACEI/ARB at present.    F/U in 4 months    Signed electronically by: Mack Guise, MD  [facility]  [facility] [address] [place], [place] [address] Phone: [phone] FAX: [phone]   CC: Denita Lung, [name] [address] Phone: [phone]  Fax: [phone]  Return to Endocrinology clinic as below: Future Appointments  Date Time Provider [place]  06/26/2020 11:45 AM Payseur, Dionne Bucy, PTA [facility]  06/28/2020 11:00 AM Dawayne Patricia, PT [facility]  07/02/2020  3:00 PM Jessy Oto, MD [facility] None  07/03/2020 11:00 AM Payseur, Dionne Bucy, PTA [facility]  07/05/2020 11:00 AM Dawayne Patricia, PT [facility]  07/05/2020  1:30 PM Salley Slaughter, NP [facility] None  07/05/2020  2:00 PM Hermine Messick, LCSW [facility] None  07/25/2020  8:40 AM Laqueta Linden, MD [facility] None  08/08/2020  2:00 PM Laqueta Linden, MD [facility] None  08/13/2020  1:00 PM [facility] None  08/13/2020  1:30 PM Magrinat, Virgie Dad, MD [facility] None

## 2020-06-26 ENCOUNTER — Ambulatory Visit: Payer: 59 | Admitting: Physical Therapy

## 2020-06-26 DIAGNOSIS — R262 Difficulty in walking, not elsewhere classified: Secondary | ICD-10-CM

## 2020-06-26 DIAGNOSIS — M5441 Lumbago with sciatica, right side: Secondary | ICD-10-CM | POA: Diagnosis not present

## 2020-06-26 DIAGNOSIS — M6281 Muscle weakness (generalized): Secondary | ICD-10-CM

## 2020-06-26 NOTE — Therapy (Signed)
[facility]. [place], [name], [address] Phone: [phone]   Fax:  [phone]  Physical Therapy Treatment  Patient Details  Name: Kelly Mendez MRN: [id number] Date of Birth: [birth year] Referring Provider (PT): Louanne Skye   Encounter Date: 06/26/2020   PT End of Session - 06/26/20 1213    Visit Number 2    Date for PT Re-Evaluation 08/22/20    PT Start Time 0488    PT Stop Time 1215    PT Time Calculation (min) 40 min           Past Medical History:  Diagnosis Date  . Abnormal Pap smear of cervix   . Anxiety   . Asthma   . Asymptomatic microscopic hematuria 09/29/2017  . 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 Concussion    around age 89  . Diabetes type 2, uncontrolled ([place])    new diagnosis in 08/2016  . DVT (deep venous thrombosis) ([place]) 11/24/2016   in rt arm  . Family history of breast cancer   . Family history of neurofibromatosis   . Family history of ovarian cancer   . History of radiation therapy 04/21/17- 05/20/17   Right Breast 40.05 Gy in 15 fractions followed by a 10 Gy boost in 5 fractions to yield a total dose of 50.05 Gy  . HPV in female   . Hyperlipemia   . Hypertension   . Malignant neoplasm of upper-inner quadrant of right breast in female, estrogen receptor negative ([place]) 10/21/2016  . Neuromuscular disorder (HCC)    neuropathy  . Pelvic mass in female 06/16/2017  . Personal history of chemotherapy 2018  . Personal history of radiation therapy 2018  . Pneumonia    2014   . PTSD (post-traumatic stress disorder)     Past Surgical History:  Procedure Laterality Date  . BREAST LUMPECTOMY Right 03/18/2017  . BREAST LUMPECTOMY WITH RADIOACTIVE SEED AND SENTINEL LYMPH NODE BIOPSY Right 03/18/2017   Procedure: RIGHT BREAST LUMPECTOMY WITH RADIOACTIVE SEED AND RIGHT SENTINEL LYMPH NODE BIOPSY;  Surgeon: [name], MD;  Location: [facility];  Service: General;  Laterality: Right;  . COLPOSCOPY    . left ovary removed  1978   was told her ovary was removed but Dr. Denman George found that was not the case. Suspect this was originally an ovarian cystectomy  . LUMBAR LAMINECTOMY/DECOMPRESSION MICRODISCECTOMY N/A 05/04/2020   Procedure: BILATERAL LUMBAR FIVE THROUGH SACRAL ONE MICRODISCECTOMY;  Surgeon: Jessy Oto, MD;  Location: [facility];  Service: Orthopedics;  Laterality: N/A;  . PORTACATH PLACEMENT Right 10/29/2016   Procedure: INSERTION PORT-A-CATH WITH [place];  Surgeon: [name], MD;  Location: [facility];  Service: General;  Laterality: Right;  . portacath removal     oct. 5 2018  . ROBOTIC ASSISTED TOTAL HYSTERECTOMY Bilateral 06/16/2017   Procedure: XI ROBOTIC ASSISTED TOTAL HYSTERECTOMY WITH BILATERAL SALPINGECTOMY,  OOPHERECTOMY;  Surgeon: [name], MD;  [facility];  Service: Gynecology;  Laterality: Bilateral;    There were no vitals filed for this visit.   Subjective Assessment - 06/26/20 1137    Subjective doing pretty well, just some hip pain. HEP going well- verb some pain with bridge and SLR ( instructed to decrease ROM)    Currently in Pain? Yes    Pain Score 3     Pain Location Hip    Pain Orientation Right  [facility] Adult PT Treatment/Exercise - 06/26/20 0001      Lumbar Exercises: Aerobic   Nustep L 5 82mn      Lumbar Exercises: Standing   Other Standing Lumbar Exercises 5# cable pulleys shld ext and retraction 2 sets 10   green tband ER 2 sets 10   Other Standing Lumbar Exercises 10 # resisted gait 5 fwd/back and 5x each side   hip ext and and red tband 10 each     Lumbar Exercises: Seated   Sit to Stand 10 reps   with wt ball chest press   Other Seated Lumbar Exercises isometric abs 15 x hold 3 sec with ball    Other Seated Lumbar Exercises sit fit pelvic ROM and stab ( LAQ, hip flex and abd)      Lumbar Exercises: Supine   Clam 15 reps;3  seconds   green tband   Bent Knee Raise 10 reps;3 seconds   green tband   Other Supine Lumbar Exercises feet on ball bridge, KTC and obl 10 each    Other Supine Lumbar Exercises ball squeeze 10 x                    PT Short Term Goals - 06/26/20 1213      PT SHORT TERM GOAL #1   Title Pt will be I with initial HEP    Status Achieved             PT Long Term Goals - 06/21/20 1528      PT LONG TERM GOAL #1   Title independent with HEP    Time 6    Period Weeks    Status New    Target Date 08/02/20      PT LONG TERM GOAL #2   Title negotiate stairs reciprocially without increase in pain for improved function    Time 6    Period Weeks    Status New    Target Date 08/02/20      PT LONG TERM GOAL #3   Title Pt will demo lumbar ROM WFL with </= 1/10 LBP    Time 6    Period Weeks    Status New    Target Date 08/02/20      PT LONG TERM GOAL #4   Title Pt will report able to stand/walk >30 min with </= 1/10 LBP    Time 6    Period Weeks    Status New    Target Date 08/02/20                 Plan - 06/26/20 1214    Clinical Impression Statement STG met- educ to limit ROM if painfula nd she VU. tolerated initial progress of ther ex well. cue wth BM and core activation. educ on log rolling for back safety    PT Treatment/Interventions ADLs/Self Care Home Management;Electrical Stimulation;Iontophoresis 497mml Dexamethasone;Moist Heat;Neuromuscular re-education;Therapeutic exercise;Therapeutic activities;Stair training;Gait training;Patient/family education;Manual techniques    PT Next Visit Plan get pt moving, slowly introduce gym activities, core stab, postural ex's, gait training no AD           Patient will benefit from skilled therapeutic intervention in order to improve the following deficits and impairments:  Abnormal gait,Decreased range of motion,Difficulty walking,Decreased endurance,Increased muscle spasms,Decreased activity  tolerance,Pain,Hypomobility,Impaired flexibility,Decreased strength,Postural dysfunction  Visit Diagnosis: Acute right-sided low back pain with right-sided sciatica  Muscle weakness (generalized)  Difficulty in walking, not elsewhere classified     Problem  List Patient Active Problem List   Diagnosis Date Noted  . OSA (obstructive sleep apnea) 06/19/2020  . Insomnia 06/19/2020  . Herniation of lumbar intervertebral disc with radiculopathy 05/04/2020    Class: Chronic  . Spinal stenosis of lumbar region 05/04/2020  . Lumbar disc herniation with radiculopathy 05/04/2020  . Vitamin D deficiency 04/27/2020  . Witnessed episode of apnea 04/27/2020  . At risk for sleep apnea 04/27/2020  . Elevated blood pressure reading in office with diagnosis of hypertension 04/27/2020  . Attention deficit hyperactivity disorder (ADHD), predominantly inattentive type 04/11/2020  . Moderate episode of recurrent major depressive disorder ([place]) 04/11/2020  . Generalized anxiety disorder   . Night sweats 10/24/2019  . Chronic right-sided low back pain with right-sided sciatica 10/24/2019  . Fatigue 10/24/2019  . Type 2 diabetes mellitus with hyperglycemia, without long-term current use of insulin ([facility]) 08/19/2019  . Hot flashes 08/13/2018  . Snoring 04/15/2018  . Post traumatic stress disorder (PTSD) 02/25/2018  . Anxiety   . Asymptomatic microscopic hematuria 09/29/2017  . Iron deficiency anemia 08/06/2017  . Adnexal mass, RIGHT 12/16/2016  . Menorrhagia 12/16/2016  . Genetic testing 12/15/2016  . Family history of breast cancer   . Family history of ovarian cancer   . Family history of neurofibromatosis   . Acute deep vein thrombosis (DVT) of axillary vein of right upper extremity ([place]) 11/26/2016  . Type 2 diabetes mellitus with diabetic polyneuropathy, without long-term current use of insulin ([facility])   . Malignant neoplasm of upper-inner quadrant of right breast in female, estrogen receptor  negative ([facility]) 10/21/2016  . Low HDL (under 40) 09/16/2016  . ASCVD (arteriosclerotic cardiovascular disease) 09/16/2016  . Light cigarette smoker 09/15/2016  . Morbid obesity ([place]) 09/15/2016  . Mild intermittent asthma 09/15/2016    Filbert Craze,ANGIE PTA 06/26/2020, 12:32 PM  [facility]. [place], [name], [address] Phone: [phone]   Fax:  [phone]  Name: Kelly Mendez MRN: [id number] Date of Birth: 02-12-1976

## 2020-06-28 ENCOUNTER — Ambulatory Visit: Payer: 59 | Admitting: Physical Therapy

## 2020-07-02 ENCOUNTER — Encounter: Payer: Self-pay | Admitting: Specialist

## 2020-07-02 ENCOUNTER — Ambulatory Visit (INDEPENDENT_AMBULATORY_CARE_PROVIDER_SITE_OTHER): Payer: 59 | Admitting: Specialist

## 2020-07-02 ENCOUNTER — Other Ambulatory Visit: Payer: Self-pay

## 2020-07-02 VITALS — BP 124/79 | HR 96 | Ht 74.0 in | Wt 333.3 lb

## 2020-07-02 DIAGNOSIS — Z9889 Other specified postprocedural states: Secondary | ICD-10-CM

## 2020-07-02 NOTE — Progress Notes (Signed)
Post-Op Visit Note   Patient: Kelly Mendez           Date of Birth: May 19, 1976           MRN: [id number] Visit Date: 07/02/2020 PCP: Denita Lung, MD   Assessment & Plan:8 weeks post op bilateral microdiscectomy   Chief Complaint:  Chief Complaint  Patient presents with  . Lower Back - Routine Post Op  Incision is well healed. The legs are NV normal. Normal gait. Going to PT at [facility].  Visit Diagnoses: No diagnosis found.  Plan: Avoid frequent bending and stooping  No lifting greater than 10 lbs. May use ice or moist heat for pain. Weight loss is of benefit. Best medication for lumbar disc disease is arthritis medications like motrin, celebrex and naprosyn. Exercise is important to improve your indurance and does allow people to function better inspite of back pain.    Follow-Up Instructions: No follow-ups on file.   Orders:  No orders of the defined types were placed in this encounter.  No orders of the defined types were placed in this encounter.   Imaging: No results found.  PMFS History: Patient Active Problem List   Diagnosis Date Noted  . Herniation of lumbar intervertebral disc with radiculopathy 05/04/2020    Priority: High    Class: Chronic  . Spinal stenosis of lumbar region 05/04/2020    Priority: High  . OSA (obstructive sleep apnea) 06/19/2020  . Insomnia 06/19/2020  . Lumbar disc herniation with radiculopathy 05/04/2020  . Vitamin D deficiency 04/27/2020  . Witnessed episode of apnea 04/27/2020  . At risk for sleep apnea 04/27/2020  . Elevated blood pressure reading in office with diagnosis of hypertension 04/27/2020  . Attention deficit hyperactivity disorder (ADHD), predominantly inattentive type 04/11/2020  . Moderate episode of recurrent major depressive disorder ([place]) 04/11/2020  . Generalized anxiety disorder   . Night sweats 10/24/2019  . Chronic right-sided low back pain with right-sided sciatica  10/24/2019  . Fatigue 10/24/2019  . Type 2 diabetes mellitus with hyperglycemia, without long-term current use of insulin ([place]) 08/19/2019  . Hot flashes 08/13/2018  . Snoring 04/15/2018  . Post traumatic stress disorder (PTSD) 02/25/2018  . Anxiety   . Asymptomatic microscopic hematuria 09/29/2017  . Iron deficiency anemia 08/06/2017  . Adnexal mass, RIGHT 12/16/2016  . Menorrhagia 12/16/2016  . Genetic testing 12/15/2016  . Family history of breast cancer   . Family history of ovarian cancer   . Family history of neurofibromatosis   . Acute deep vein thrombosis (DVT) of axillary vein of right upper extremity ([place]) 11/26/2016  . Type 2 diabetes mellitus with diabetic polyneuropathy, without long-term current use of insulin ([place])   . Malignant neoplasm of upper-inner quadrant of right breast in female, estrogen receptor negative ([place]) 10/21/2016  . Low HDL (under 40) 09/16/2016  . ASCVD (arteriosclerotic cardiovascular disease) 09/16/2016  . Light cigarette smoker 09/15/2016  . Morbid obesity ([place]) 09/15/2016  . Mild intermittent asthma 09/15/2016   Past Medical History:  Diagnosis Date  . Abnormal Pap smear of cervix   . Anxiety   . Asthma   . Asymptomatic microscopic hematuria 09/29/2017  . 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 Concussion    around age 88  . Diabetes type 2, uncontrolled ([place])    new diagnosis in 08/2016  . DVT (deep venous thrombosis) ([place]) 11/24/2016   in rt  arm  . Family history of breast cancer   . Family history of neurofibromatosis   . Family history of ovarian cancer   . History of radiation therapy 04/21/17- 05/20/17   Right Breast 40.05 Gy in 15 fractions followed by a 10 Gy boost in 5 fractions to yield a total dose of 50.05 Gy  . HPV in female   . Hyperlipemia   . Hypertension   . Malignant neoplasm of upper-inner quadrant of right breast in female, estrogen receptor negative ([place]) 10/21/2016  . Neuromuscular  disorder (HCC)    neuropathy  . Pelvic mass in female 06/16/2017  . Personal history of chemotherapy 2018  . Personal history of radiation therapy 2018  . Pneumonia    2014   . PTSD (post-traumatic stress disorder)     Family History  Problem Relation Age of Onset  . Breast cancer Mother 61  . Hepatitis C Father   . Ovarian cancer Maternal Aunt        dx in her 57s  . Neurofibromatosis Maternal Uncle   . Brain cancer Maternal Uncle 58  . Lung cancer Maternal Grandfather   . Kidney failure Paternal Grandmother   . Heart attack Paternal Grandfather   . Ovarian cancer Maternal Aunt        dx in her 16s  . Neurofibromatosis Maternal Aunt   . Ovarian cancer Maternal Aunt   . Neurofibromatosis Maternal Aunt   . Cervical cancer Maternal Aunt   . Neurofibromatosis Maternal Aunt   . Cancer Maternal Aunt        cancer on the bottom of her foot  . Breast cancer Other        MGF's sisters  . Neurofibromatosis Other        MGM's paternal aunt    Past Surgical History:  Procedure Laterality Date  . BREAST LUMPECTOMY Right 03/18/2017  . BREAST LUMPECTOMY WITH RADIOACTIVE SEED AND SENTINEL LYMPH NODE BIOPSY Right 03/18/2017   Procedure: RIGHT BREAST LUMPECTOMY WITH RADIOACTIVE SEED AND RIGHT SENTINEL LYMPH NODE BIOPSY;  Surgeon: [name], MD;  Location: [facility];  Service: General;  Laterality: Right;  . COLPOSCOPY    . left ovary removed  1978   was told her ovary was removed but Dr. Denman George found that was not the case. Suspect this was originally an ovarian cystectomy  . LUMBAR LAMINECTOMY/DECOMPRESSION MICRODISCECTOMY N/A 05/04/2020   Procedure: BILATERAL LUMBAR FIVE THROUGH SACRAL ONE MICRODISCECTOMY;  Surgeon: Jessy Oto, MD;  Location: [facility];  Service: Orthopedics;  Laterality: N/A;  . PORTACATH PLACEMENT Right 10/29/2016   Procedure: INSERTION PORT-A-CATH WITH [place];  Surgeon: [name], MD;  Location: [facility];  Service: General;  Laterality: Right;  .  portacath removal     oct. 5 2018  . ROBOTIC ASSISTED TOTAL HYSTERECTOMY Bilateral 06/16/2017   Procedure: XI ROBOTIC ASSISTED TOTAL HYSTERECTOMY WITH BILATERAL SALPINGECTOMY,  OOPHERECTOMY;  Surgeon: [name], MD;  [facility];  Service: Gynecology;  Laterality: Bilateral;   Social History   Occupational History  . Not on file  Tobacco Use  . Smoking status: Former Smoker    Packs/day: 0.15    Years: 25.00    Pack years: 3.75    Types: Cigarettes    Quit date: 11/25/2016    Years since quitting: 3.6  . Smokeless tobacco: Never Used  Vaping Use  . Vaping Use: Never used  Substance and Sexual Activity  . Alcohol use: Yes    Alcohol/week: 1.0 standard  drink    Types: 1 Glasses of wine per week    Comment: rarely   . Drug use: No  . Sexual activity: Not Currently    Birth control/protection: Surgical    Comment: husband vasectomy

## 2020-07-03 ENCOUNTER — Other Ambulatory Visit: Payer: Self-pay

## 2020-07-03 ENCOUNTER — Telehealth: Payer: Self-pay | Admitting: Specialist

## 2020-07-03 ENCOUNTER — Ambulatory Visit: Payer: 59 | Admitting: Physical Therapy

## 2020-07-03 DIAGNOSIS — M6281 Muscle weakness (generalized): Secondary | ICD-10-CM

## 2020-07-03 DIAGNOSIS — R262 Difficulty in walking, not elsewhere classified: Secondary | ICD-10-CM

## 2020-07-03 DIAGNOSIS — M5441 Lumbago with sciatica, right side: Secondary | ICD-10-CM | POA: Diagnosis not present

## 2020-07-03 NOTE — Therapy (Signed)
[facility]. [place], [name], [address] Phone: [phone]   Fax:  [phone]  Physical Therapy Treatment  Patient Details  Name: Kelly Mendez MRN: [id number] Date of Birth: 10/11/1975 Referring Provider (PT): Louanne Skye   Encounter Date: 07/03/2020   PT End of Session - 07/03/20 1142    Visit Number 3    Date for PT Re-Evaluation 08/22/20    PT Start Time 1050    PT Stop Time 1132    PT Time Calculation (min) 42 min           Past Medical History:  Diagnosis Date  . Abnormal Pap smear of cervix   . Anxiety   . Asthma   . Asymptomatic microscopic hematuria 09/29/2017  . 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 Concussion    around age 61  . Diabetes type 2, uncontrolled ([place])    new diagnosis in 08/2016  . DVT (deep venous thrombosis) ([facility]) 11/24/2016   in rt arm  . Family history of breast cancer   . Family history of neurofibromatosis   . Family history of ovarian cancer   . History of radiation therapy 04/21/17- 05/20/17   Right Breast 40.05 Gy in 15 fractions followed by a 10 Gy boost in 5 fractions to yield a total dose of 50.05 Gy  . HPV in female   . Hyperlipemia   . Hypertension   . 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 Neuromuscular disorder (HCC)    neuropathy  . Pelvic mass in female 06/16/2017  . Personal history of chemotherapy 2018  . Personal history of radiation therapy 2018  . Pneumonia    2014   . PTSD (post-traumatic stress disorder)     Past Surgical History:  Procedure Laterality Date  . BREAST LUMPECTOMY Right 03/18/2017  . BREAST LUMPECTOMY WITH RADIOACTIVE SEED AND SENTINEL LYMPH NODE BIOPSY Right 03/18/2017   Procedure: RIGHT BREAST LUMPECTOMY WITH RADIOACTIVE SEED AND RIGHT SENTINEL LYMPH NODE BIOPSY;  Surgeon: [name], MD;  Location: [facility];  Service: General;  Laterality: Right;  . COLPOSCOPY    . left ovary removed  1978   was told her ovary was removed but Dr. Denman George found that was not the case. Suspect this was originally an ovarian cystectomy  . LUMBAR LAMINECTOMY/DECOMPRESSION MICRODISCECTOMY N/A 05/04/2020   Procedure: BILATERAL LUMBAR FIVE THROUGH SACRAL ONE MICRODISCECTOMY;  Surgeon: Jessy Oto, MD;  Location: [facility];  Service: Orthopedics;  Laterality: N/A;  . PORTACATH PLACEMENT Right 10/29/2016   Procedure: INSERTION PORT-A-CATH WITH [place];  Surgeon: [name], MD;  Location: [facility];  Service: General;  Laterality: Right;  . portacath removal     oct. 5 2018  . ROBOTIC ASSISTED TOTAL HYSTERECTOMY Bilateral 06/16/2017   Procedure: XI ROBOTIC ASSISTED TOTAL HYSTERECTOMY WITH BILATERAL SALPINGECTOMY,  OOPHERECTOMY;  Surgeon: [name], MD;  [facility];  Service: Gynecology;  Laterality: Bilateral;    There were no vitals filed for this visit.   Subjective Assessment - 07/03/20 1059    Subjective felt good after last therapy. saw MD and he was pleased -still some swelling so 4 more weeks out of work    Currently in Pain? No/denies  [facility] Adult PT Treatment/Exercise - 07/03/20 0001      Lumbar Exercises: Aerobic   UBE (Upper Arm Bike) L 2 2 fwd/2 back    Nustep L 5 7 min      Lumbar Exercises: Machines for Strengthening   Cybex Lumbar Extension black tband 2 sets 10   black tband trunk flex 2 sets 10   Cybex Knee Extension 10# 2 sets 10    Cybex Knee Flexion 20# 2 sets 10    Other Lumbar Machine Exercise seated row 20# 2 sets 10    Other Lumbar Machine Exercise lat pull 20# 2 sets 10      Lumbar Exercises: Standing   Heel Raises 20 reps   black bar   Other Standing Lumbar Exercises ball vs wall push ups 5 x CW and 5 X CCW   6 inch step up opp leg ext 10 BIL with UE support and then lateral step up with opp leg abd   Other Standing Lumbar  Exercises red tband  hip ext and abd 15 BIL      Lumbar Exercises: Seated   Sit to Stand 10 reps   wt ball chest press                   PT Short Term Goals - 06/26/20 1213      PT SHORT TERM GOAL #1   Title Pt will be I with initial HEP    Status Achieved             PT Long Term Goals - 06/21/20 1528      PT LONG TERM GOAL #1   Title independent with HEP    Time 6    Period Weeks    Status New    Target Date 08/02/20      PT LONG TERM GOAL #2   Title negotiate stairs reciprocially without increase in pain for improved function    Time 6    Period Weeks    Status New    Target Date 08/02/20      PT LONG TERM GOAL #3   Title Pt will demo lumbar ROM WFL with </= 1/10 LBP    Time 6    Period Weeks    Status New    Target Date 08/02/20      PT LONG TERM GOAL #4   Title Pt will report able to stand/walk >30 min with </= 1/10 LBP    Time 6    Period Weeks    Status New    Target Date 08/02/20                 Plan - 07/03/20 1142    Clinical Impression Statement 42 min of ex without rest. postural cuing and core engagement cuing with ex but overall tolerated interventions well. amb in cliic without AD.    PT Treatment/Interventions ADLs/Self Care Home Management;Electrical Stimulation;Iontophoresis 4mg /ml Dexamethasone;Moist Heat;Neuromuscular re-education;Therapeutic exercise;Therapeutic activities;Stair training;Gait training;Patient/family education;Manual techniques    PT Next Visit Plan assess and progress as tolerated. CHECK GOALS           Patient will benefit from skilled therapeutic intervention in order to improve the following deficits and impairments:  Abnormal gait,Decreased range of motion,Difficulty walking,Decreased endurance,Increased muscle spasms,Decreased activity tolerance,Pain,Hypomobility,Impaired flexibility,Decreased strength,Postural dysfunction  Visit Diagnosis: Muscle weakness (generalized)  Difficulty in walking,  not elsewhere classified     Problem List Patient Active Problem List   Diagnosis Date Noted  .  OSA (obstructive sleep apnea) 06/19/2020  . Insomnia 06/19/2020  . Herniation of lumbar intervertebral disc with radiculopathy 05/04/2020    Class: Chronic  . Spinal stenosis of lumbar region 05/04/2020  . Lumbar disc herniation with radiculopathy 05/04/2020  . Vitamin D deficiency 04/27/2020  . Witnessed episode of apnea 04/27/2020  . At risk for sleep apnea 04/27/2020  . Elevated blood pressure reading in office with diagnosis of hypertension 04/27/2020  . Attention deficit hyperactivity disorder (ADHD), predominantly inattentive type 04/11/2020  . Moderate episode of recurrent major depressive disorder ([place]) 04/11/2020  . Generalized anxiety disorder   . Night sweats 10/24/2019  . Chronic right-sided low back pain with right-sided sciatica 10/24/2019  . Fatigue 10/24/2019  . Type 2 diabetes mellitus with hyperglycemia, without long-term current use of insulin ([place]) 08/19/2019  . Hot flashes 08/13/2018  . Snoring 04/15/2018  . Post traumatic stress disorder (PTSD) 02/25/2018  . Anxiety   . Asymptomatic microscopic hematuria 09/29/2017  . Iron deficiency anemia 08/06/2017  . Adnexal mass, RIGHT 12/16/2016  . Menorrhagia 12/16/2016  . Genetic testing 12/15/2016  . Family history of breast cancer   . Family history of ovarian cancer   . Family history of neurofibromatosis   . Acute deep vein thrombosis (DVT) of axillary vein of right upper extremity ([place]) 11/26/2016  . Type 2 diabetes mellitus with diabetic polyneuropathy, without long-term current use of insulin ([place])   . 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 Low HDL (under 40) 09/16/2016  . ASCVD (arteriosclerotic cardiovascular disease) 09/16/2016  . Light cigarette smoker 09/15/2016  . Morbid obesity ([place]) 09/15/2016  . Mild intermittent asthma  09/15/2016    Jansen Sciuto,ANGIE PTA 07/03/2020, 11:44 AM  [facility]. [place], [name], [address] Phone: [phone]   Fax:  [phone]  Name: Kelly Mendez MRN: [id number] Date of Birth: [birth year]

## 2020-07-03 NOTE — Telephone Encounter (Signed)
[facility] form received. Sent to [facility].

## 2020-07-05 ENCOUNTER — Ambulatory Visit (INDEPENDENT_AMBULATORY_CARE_PROVIDER_SITE_OTHER): Payer: 59 | Admitting: Licensed Clinical Social Worker

## 2020-07-05 ENCOUNTER — Other Ambulatory Visit: Payer: Self-pay

## 2020-07-05 ENCOUNTER — Telehealth (HOSPITAL_COMMUNITY): Payer: 59 | Admitting: Psychiatry

## 2020-07-05 ENCOUNTER — Ambulatory Visit: Payer: 59 | Admitting: Physical Therapy

## 2020-07-05 DIAGNOSIS — F411 Generalized anxiety disorder: Secondary | ICD-10-CM

## 2020-07-05 DIAGNOSIS — R262 Difficulty in walking, not elsewhere classified: Secondary | ICD-10-CM

## 2020-07-05 DIAGNOSIS — M5441 Lumbago with sciatica, right side: Secondary | ICD-10-CM | POA: Diagnosis not present

## 2020-07-05 DIAGNOSIS — M6281 Muscle weakness (generalized): Secondary | ICD-10-CM

## 2020-07-05 NOTE — Therapy (Signed)
[facility]. [place], [name], [address] Phone: [phone]   Fax:  [phone]  Physical Therapy Treatment  Patient Details  Name: Kelly Mendez MRN: [id number] Date of Birth: 1975/09/27 Referring Provider (PT): Louanne Skye   Encounter Date: 07/05/2020   PT End of Session - 07/05/20 1135    Visit Number 4    Date for PT Re-Evaluation 08/22/20    PT Start Time 1055    PT Stop Time 1140    PT Time Calculation (min) 45 min           Past Medical History:  Diagnosis Date  . Abnormal Pap smear of cervix   . Anxiety   . Asthma   . Asymptomatic microscopic hematuria 09/29/2017  . 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 Concussion    around age 63  . Diabetes type 2, uncontrolled ([place])    new diagnosis in 08/2016  . DVT (deep venous thrombosis) ([place]) 11/24/2016   in rt arm  . Family history of breast cancer   . Family history of neurofibromatosis   . Family history of ovarian cancer   . History of radiation therapy 04/21/17- 05/20/17   Right Breast 40.05 Gy in 15 fractions followed by a 10 Gy boost in 5 fractions to yield a total dose of 50.05 Gy  . HPV in female   . Hyperlipemia   . Hypertension   . Malignant neoplasm of upper-inner quadrant of right breast in female, estrogen receptor negative ([place]) 10/21/2016  . Neuromuscular disorder (HCC)    neuropathy  . Pelvic mass in female 06/16/2017  . Personal history of chemotherapy 2018  . Personal history of radiation therapy 2018  . Pneumonia    2014   . PTSD (post-traumatic stress disorder)     Past Surgical History:  Procedure Laterality Date  . BREAST LUMPECTOMY Right 03/18/2017  . BREAST LUMPECTOMY WITH RADIOACTIVE SEED AND SENTINEL LYMPH NODE BIOPSY Right 03/18/2017   Procedure: RIGHT BREAST LUMPECTOMY WITH RADIOACTIVE SEED AND RIGHT SENTINEL LYMPH NODE BIOPSY;  Surgeon: [name], MD;  Location: [facility];  Service: General;  Laterality: Right;  . COLPOSCOPY    . left ovary removed  1978   was told her ovary was removed but Dr. Denman George found that was not the case. Suspect this was originally an ovarian cystectomy  . LUMBAR LAMINECTOMY/DECOMPRESSION MICRODISCECTOMY N/A 05/04/2020   Procedure: BILATERAL LUMBAR FIVE THROUGH SACRAL ONE MICRODISCECTOMY;  Surgeon: Jessy Oto, MD;  Location: [facility];  Service: Orthopedics;  Laterality: N/A;  . PORTACATH PLACEMENT Right 10/29/2016   Procedure: INSERTION PORT-A-CATH WITH [place];  Surgeon: [name], MD;  Location: [facility];  Service: General;  Laterality: Right;  . portacath removal     oct. 5 2018  . ROBOTIC ASSISTED TOTAL HYSTERECTOMY Bilateral 06/16/2017   Procedure: XI ROBOTIC ASSISTED TOTAL HYSTERECTOMY WITH BILATERAL SALPINGECTOMY,  OOPHERECTOMY;  Surgeon: [name], MD;  Location: [facility];  Service: Gynecology;  Laterality: Bilateral;    There were no vitals filed for this visit.   Subjective Assessment - 07/05/20 1101    Subjective tired after last session with only minimal soreness. pt amb in without AD but somewhat guarded gait    Currently in Pain? Yes    Pain Score 2     Pain Location Hip    Pain Orientation Right  [facility] Adult PT Treatment/Exercise - 07/05/20 0001      Lumbar Exercises: Aerobic   UBE (Upper Arm Bike) L 2 2 fwd/2 back   STANDING   Nustep L 5 17mn      Lumbar Exercises: Machines for Strengthening   Cybex Lumbar Extension black tband 2 sets 10   trunk flex/ext   Cybex Knee Extension 10# 2 sets 10    Cybex Knee Flexion 20# 2 sets 10    Other Lumbar Machine Exercise seated row 20# 2 sets 10    Other Lumbar Machine Exercise lat pull 20# 2 sets 10      Lumbar Exercises: Standing   Other Standing Lumbar Exercises resisted gait 40 # 5 x each way    Other Standing Lumbar Exercises step up 6inch with opp leg ext and then laterally with opp leg abd                     PT Short Term Goals - 06/26/20 1213      PT SHORT TERM GOAL #1   Title Pt will be I with initial HEP    Status Achieved             PT Long Term Goals - 07/05/20 1144      PT LONG TERM GOAL #1   Title independent with HEP    Status On-going      PT LONG TERM GOAL #2   Title negotiate stairs reciprocially without increase in pain for improved function    Status Partially Met      PT LONG TERM GOAL #3   Title Pt will demo lumbar ROM WFL with </= 1/10 LBP    Status Partially Met      PT LONG TERM GOAL #4   Title Pt will report able to stand/walk >30 min with </= 1/10 LBP    Status Partially Met      PT LONG TERM GOAL #5   Title demonstrate 4+/5 Rt hip strength for improved function    Status Partially Met                 Plan - 07/05/20 1146    Clinical Impression Statement pt is making good progress with therapy interventions. minimal pain working on strength and stamina with some cuing needed[facility] and not confident outside home without AD. progressing toward LTGs.    Examination-Activity Limitations Locomotion Level;Stairs;Stand;Bend    PT Treatment/Interventions ADLs/Self Care Home Management;Electrical Stimulation;Iontophoresis 444mml Dexamethasone;Moist Heat;Neuromuscular re-education;Therapeutic exercise;Therapeutic activities;Stair training;Gait training;Patient/family education;Manual techniques    PT Next Visit Plan progress and owrk with ot for safe gym return after first of year           Patient will benefit from skilled therapeutic intervention in order to improve the following deficits and impairments:  Abnormal gait,Decreased range of motion,Difficulty walking,Decreased endurance,Increased muscle spasms,Decreased activity tolerance,Pain,Hypomobility,Impaired flexibility,Decreased strength,Postural dysfunction  Visit Diagnosis: Muscle weakness (generalized)  Difficulty in walking, not  elsewhere classified     Problem List Patient Active Problem List   Diagnosis Date Noted  . OSA (obstructive sleep apnea) 06/19/2020  . Insomnia 06/19/2020  . Herniation of lumbar intervertebral disc with radiculopathy 05/04/2020    Class: Chronic  . Spinal stenosis of lumbar region 05/04/2020  . Lumbar disc herniation with radiculopathy 05/04/2020  . Vitamin D deficiency 04/27/2020  . Witnessed episode of apnea 04/27/2020  . At risk for sleep apnea 04/27/2020  .  Elevated blood pressure reading in office with diagnosis of hypertension 04/27/2020  . Attention deficit hyperactivity disorder (ADHD), predominantly inattentive type 04/11/2020  . Moderate episode of recurrent major depressive disorder ([facility]) 04/11/2020  . Generalized anxiety disorder   . Night sweats 10/24/2019  . Chronic right-sided low back pain with right-sided sciatica 10/24/2019  . Fatigue 10/24/2019  . Type 2 diabetes mellitus with hyperglycemia, without long-term current use of insulin ([facility]) 08/19/2019  . Hot flashes 08/13/2018  . Snoring 04/15/2018  . Post traumatic stress disorder (PTSD) 02/25/2018  . Anxiety   . Asymptomatic microscopic hematuria 09/29/2017  . Iron deficiency anemia 08/06/2017  . Adnexal mass, RIGHT 12/16/2016  . Menorrhagia 12/16/2016  . Genetic testing 12/15/2016  . Family history of breast cancer   . Family history of ovarian cancer   . Family history of neurofibromatosis   . Acute deep vein thrombosis (DVT) of axillary vein of right upper extremity ([facility]) 11/26/2016  . Type 2 diabetes mellitus with diabetic polyneuropathy, without long-term current use of insulin ([place])   . Malignant neoplasm of upper-inner quadrant of right breast in female, estrogen receptor negative ([place]) 10/21/2016  . Low HDL (under 40) 09/16/2016  . ASCVD (arteriosclerotic cardiovascular disease) 09/16/2016  . Light cigarette smoker 09/15/2016  . Morbid obesity ([facility]) 09/15/2016  . Mild intermittent asthma  09/15/2016    Sequan Auxier,ANGIE PTA 07/05/2020, 11:48 AM  [facility]. [place], [name], [address] Phone: [phone]   Fax:  [phone]  Name: Kelly Mendez MRN: [id number] Date of Birth: 1976/03/20

## 2020-07-10 ENCOUNTER — Ambulatory Visit: Payer: 59 | Admitting: Physical Therapy

## 2020-07-10 ENCOUNTER — Telehealth: Payer: Self-pay | Admitting: Specialist

## 2020-07-10 NOTE — Telephone Encounter (Signed)
[employer] forms received. Sent to [employer]. 

## 2020-07-10 NOTE — Progress Notes (Signed)
   THERAPIST PROGRESS NOTE   Virtual Visit via Video Note  I connected with Kelly Mendez on 07/05/20 at  2:00 PM EST by a video enabled telemedicine application and verified that I am speaking with the correct person using two identifiers.  Location: Patient: Home Provider: [facility]   I discussed the limitations of evaluation and management by telemedicine and the availability of in person appointments. The patient expressed understanding and agreed to proceed. I discussed the assessment and treatment plan with the patient. The patient was provided an opportunity to ask questions and all were answered. The patient agreed with the plan and demonstrated an understanding of the instructions.   I provided 35 minutes of non-face-to-face time during this encounter.   Participation Level: Active  Behavioral Response: CasualAlert and DrowsyAnxious  Type of Therapy: Individual Therapy  Treatment Goals addressed: Anxiety and Coping  Interventions: Motivational Interviewing and Supportive  Summary: Kelly Mendez is a 44 y.o. female who presents with hx of GAD. This date LCSW sends link for video session per schedule. When pt fails to sign on LCSW calls pt. Call goes to vm and as clinician completing message pt signs on for video conference. Pt states she forgot and was asleep. She is drowsy but as session progresses she becomes more alert. Pt reports she is continuing to recover from back surgery she had on Oct. 22nd. She states her unmanaged physical pain is significantly reduced at this point, helping her mood and overall coping. She is participating in PT and is pleased with outcome of surgery. Pt is on break from school further reducing her stress. She reports her depression is "about the same" but her anxiety is increased r/t the holidays. She provides details. Pt is taking meds as prescribed and knows they are a help d/t difference she could tell when she ran out  briefly. Pt also reports her spouse lost his job last wk. He is reportedly a [employer] architect and he lost his license for 30 days d/t a ticket in [place] thus he got terminated. Pt states their goal is for spouse to get his own truck and pt manage the business end. 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 LCSW assessed for birthday plans, thoughts/feelings as pt birthday is tomorrow. Pt reports her teen boys are going to cook her breakfast. She states they will keep all rather low key and by hx her birthday has been poorly recognized since it is Christmas eve. Pt denies any other worries/concerns. Provided support, reviewed coping strategies and poc prior to close of session. Pt states appreciation for care.    Suicidal/Homicidal: Nowithout intent/plan  Therapist Response: Pt receptive to ongoing care.  Plan: Return again for next available appt.  Diagnosis: Axis I: Generalized Anxiety Disorder    Axis II: Deferred  [place], LCSW 07/10/2020

## 2020-07-12 ENCOUNTER — Ambulatory Visit: Payer: 59 | Admitting: Physical Therapy

## 2020-07-12 ENCOUNTER — Other Ambulatory Visit: Payer: Self-pay | Admitting: *Deleted

## 2020-07-12 ENCOUNTER — Encounter: Payer: Self-pay | Admitting: Internal Medicine

## 2020-07-12 ENCOUNTER — Encounter: Payer: Self-pay | Admitting: Family Medicine

## 2020-07-12 ENCOUNTER — Telehealth: Payer: Self-pay | Admitting: Specialist

## 2020-07-12 DIAGNOSIS — I10 Essential (primary) hypertension: Secondary | ICD-10-CM

## 2020-07-12 DIAGNOSIS — E118 Type 2 diabetes mellitus with unspecified complications: Secondary | ICD-10-CM

## 2020-07-12 MED ORDER — METFORMIN HCL ER 500 MG PO TB24
1000.0000 mg | ORAL_TABLET | Freq: Two times a day (BID) | ORAL | 3 refills | Status: DC
Start: 2020-07-12 — End: 2021-04-22

## 2020-07-12 MED ORDER — TRULICITY 1.5 MG/0.5ML ~~LOC~~ SOAJ: 1.5000 mg | SUBCUTANEOUS | 1 refills | Status: DC

## 2020-07-12 MED ORDER — LOSARTAN POTASSIUM 50 MG PO TABS: 50.0000 mg | ORAL_TABLET | Freq: Every day | ORAL | 0 refills | Status: DC

## 2020-07-12 MED ORDER — ATORVASTATIN CALCIUM 20 MG PO TABS
20.0000 mg | ORAL_TABLET | Freq: Every day | ORAL | 0 refills | Status: DC
Start: 2020-07-12 — End: 2020-12-06

## 2020-07-12 NOTE — Telephone Encounter (Signed)
Patient submitted medical release form, short term disability, and $25.00 cash payment to [employer]. Accepted 07/12/20

## 2020-07-17 ENCOUNTER — Other Ambulatory Visit: Payer: Self-pay

## 2020-07-17 ENCOUNTER — Ambulatory Visit: Payer: 59 | Attending: Specialist | Admitting: Physical Therapy

## 2020-07-17 DIAGNOSIS — M6281 Muscle weakness (generalized): Secondary | ICD-10-CM | POA: Insufficient documentation

## 2020-07-17 DIAGNOSIS — R252 Cramp and spasm: Secondary | ICD-10-CM | POA: Diagnosis present

## 2020-07-17 DIAGNOSIS — R262 Difficulty in walking, not elsewhere classified: Secondary | ICD-10-CM | POA: Insufficient documentation

## 2020-07-17 NOTE — Therapy (Addendum)
[facility]. [place], [name], [address] Phone: [phone]   Fax:  [phone]  Physical Therapy Treatment  Patient Details  Name: Kelly Mendez MRN: [id number] Date of Birth: [birth year] Referring Provider (PT): Louanne Skye   Encounter Date: 07/17/2020   PT End of Session - 07/17/20 1339    Visit Number 5    Date for PT Re-Evaluation 08/22/20    PT Start Time 7169    PT Stop Time 1340    PT Time Calculation (min) 45 min           Past Medical History:  Diagnosis Date  . Abnormal Pap smear of cervix   . Anxiety   . Asthma   . Asymptomatic microscopic hematuria 09/29/2017  . 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 Concussion    around age 74  . Diabetes type 2, uncontrolled ([place])    new diagnosis in 08/2016  . DVT (deep venous thrombosis) ([facility]) 11/24/2016   in rt arm  . Family history of breast cancer   . Family history of neurofibromatosis   . Family history of ovarian cancer   . History of radiation therapy 04/21/17- 05/20/17   Right Breast 40.05 Gy in 15 fractions followed by a 10 Gy boost in 5 fractions to yield a total dose of 50.05 Gy  . HPV in female   . Hyperlipemia   . Hypertension   . Malignant neoplasm of upper-inner quadrant of right breast in female, estrogen receptor negative ([facility]) 10/21/2016  . Neuromuscular disorder (HCC)    neuropathy  . Pelvic mass in female 06/16/2017  . Personal history of chemotherapy 2018  . Personal history of radiation therapy 2018  . Pneumonia    2014   . PTSD (post-traumatic stress disorder)     Past Surgical History:  Procedure Laterality Date  . BREAST LUMPECTOMY Right 03/18/2017  . BREAST LUMPECTOMY WITH RADIOACTIVE SEED AND SENTINEL LYMPH NODE BIOPSY Right 03/18/2017   Procedure: RIGHT BREAST LUMPECTOMY WITH RADIOACTIVE SEED AND RIGHT SENTINEL LYMPH NODE BIOPSY;  Surgeon: [name], MD;  Location: [facility];  Service: General;  Laterality: Right;  . COLPOSCOPY    . left ovary removed  1978   was told her ovary was removed but Dr. Denman George found that was not the case. Suspect this was originally an ovarian cystectomy  . LUMBAR LAMINECTOMY/DECOMPRESSION MICRODISCECTOMY N/A 05/04/2020   Procedure: BILATERAL LUMBAR FIVE THROUGH SACRAL ONE MICRODISCECTOMY;  Surgeon: Jessy Oto, MD;  Location: [facility];  Service: Orthopedics;  Laterality: N/A;  . PORTACATH PLACEMENT Right 10/29/2016   Procedure: INSERTION PORT-A-CATH WITH [place];  Surgeon: [name], MD;  Location: [facility];  Service: General;  Laterality: Right;  . portacath removal     oct. 5 2018  . ROBOTIC ASSISTED TOTAL HYSTERECTOMY Bilateral 06/16/2017   Procedure: XI ROBOTIC ASSISTED TOTAL HYSTERECTOMY WITH BILATERAL SALPINGECTOMY,  OOPHERECTOMY;  Surgeon: [name], MD;  [facility];  Service: Gynecology;  Laterality: Bilateral;    There were no vitals filed for this visit.   Subjective Assessment - 07/17/20 1300    Subjective overall 80% better. doing more and more flexible "  I was able tie my shoes". back on campus and classes next week. did alot of ex on my own at home last week- checking into gym today    Currently in Pain? Yes    Pain Score  3     Pain Location Hip    Pain Orientation Right                             [facility] Adult PT Treatment/Exercise - 07/17/20 0001      Lumbar Exercises: Aerobic   UBE (Upper Arm Bike) L 2 2 fwd/2 back   standing   Nustep L 5 59mn      Lumbar Exercises: Machines for Strengthening   Cybex Lumbar Extension black tband 2 sets 10   trunk flex/ext   Cybex Knee Extension 10# 2 sets 10    Cybex Knee Flexion 20# 2 sets 10    Other Lumbar Machine Exercise seated row 20# 2 sets 10    Other Lumbar Machine Exercise lat pull 20# 2 sets 10      Lumbar Exercises: Standing   Other Standing Lumbar Exercises resisted gait 40 # 5 x each way      Lumbar Exercises: Seated    Other Seated Lumbar Exercises hip add ball squeeze 15 x 3 sec hold    Other Seated Lumbar Exercises green band hip abd and flex 15 x each      Lumbar Exercises: Supine   Ab Set 15 reps;3 seconds   seated                   PT Short Term Goals - 06/26/20 1213      PT SHORT TERM GOAL #1   Title Pt will be I with initial HEP    Status Achieved             PT Long Term Goals - 07/17/20 1333      PT LONG TERM GOAL #1   Title independent with HEP    Status Partially Met      PT LONG TERM GOAL #2   Title negotiate stairs reciprocially without increase in pain for improved function    Baseline in evening step too    Status Partially Met      PT LONG TERM GOAL #3   Title Pt will demo lumbar ROM WFL with </= 1/10 LBP    Status Achieved      PT LONG TERM GOAL #4   Title Pt will report able to stand/walk >30 min with </= 1/10 LBP    Baseline 15 min walks just started 3/10    Status Partially Met      PT LONG TERM GOAL #5   Title demonstrate 4+/5 Rt hip strength for improved function    Status Achieved                 Plan - 07/17/20 1341    Clinical Impression Statement LTGs partially met. progressing with increasing strength and fucn. pt to work on gym memebership this week or next    PT Treatment/Interventions ADLs/Self Care Home Management;Electrical Stimulation;Iontophoresis 43mml Dexamethasone;Moist Heat;Neuromuscular re-education;Therapeutic exercise;Therapeutic activities;Stair training;Gait training;Patient/family education;Manual techniques    PT Next Visit Plan after this week decrease to 1 x a week and move into gym           Patient will benefit from skilled therapeutic intervention in order to improve the following deficits and impairments:  Abnormal gait,Decreased range of motion,Difficulty walking,Decreased endurance,Increased muscle spasms,Decreased activity tolerance,Pain,Hypomobility,Impaired flexibility,Decreased strength,Postural  dysfunction  Visit Diagnosis: Muscle weakness (generalized)  Difficulty in walking, not elsewhere classified  Cramp and spasm     Problem List Patient  Active Problem List   Diagnosis Date Noted  . OSA (obstructive sleep apnea) 06/19/2020  . Insomnia 06/19/2020  . Herniation of lumbar intervertebral disc with radiculopathy 05/04/2020    Class: Chronic  . Spinal stenosis of lumbar region 05/04/2020  . Lumbar disc herniation with radiculopathy 05/04/2020  . Vitamin D deficiency 04/27/2020  . Witnessed episode of apnea 04/27/2020  . At risk for sleep apnea 04/27/2020  . Elevated blood pressure reading in office with diagnosis of hypertension 04/27/2020  . Attention deficit hyperactivity disorder (ADHD), predominantly inattentive type 04/11/2020  . Moderate episode of recurrent major depressive disorder ([place]) 04/11/2020  . Generalized anxiety disorder   . Night sweats 10/24/2019  . Chronic right-sided low back pain with right-sided sciatica 10/24/2019  . Fatigue 10/24/2019  . Type 2 diabetes mellitus with hyperglycemia, without long-term current use of insulin ([place]) 08/19/2019  . Hot flashes 08/13/2018  . Snoring 04/15/2018  . Post traumatic stress disorder (PTSD) 02/25/2018  . Anxiety   . Asymptomatic microscopic hematuria 09/29/2017  . Iron deficiency anemia 08/06/2017  . Adnexal mass, RIGHT 12/16/2016  . Menorrhagia 12/16/2016  . Genetic testing 12/15/2016  . Family history of breast cancer   . Family history of ovarian cancer   . Family history of neurofibromatosis   . Acute deep vein thrombosis (DVT) of axillary vein of right upper extremity ([place]) 11/26/2016  . Type 2 diabetes mellitus with diabetic polyneuropathy, without long-term current use of insulin ([place])   . Malignant neoplasm of upper-inner quadrant of right breast in female, estrogen receptor negative ([place]) 10/21/2016  . Low HDL (under 40) 09/16/2016  . ASCVD (arteriosclerotic cardiovascular disease)  09/16/2016  . Light cigarette smoker 09/15/2016  . Morbid obesity ([place]) 09/15/2016  . Mild intermittent asthma 09/15/2016   PHYSICAL THERAPY DISCHARGE SUMMARY  Visits from Start of Care: 5  Plan: Patient agrees to discharge.  Patient goals were partially met. Patient is being discharged due to not returning since the last visit.  ?????     Amador Cunas, PT, DPT Laqueta Carina PTA 07/17/2020, 1:42 PM  [facility]. [place], [name], [address] Phone: [phone]   Fax:  [phone]  Name: Kelly Mendez MRN: [id number] Date of Birth: 19-Mar-1976

## 2020-07-19 ENCOUNTER — Ambulatory Visit: Payer: 59 | Admitting: Physical Therapy

## 2020-07-19 ENCOUNTER — Telehealth: Payer: Self-pay | Admitting: Specialist

## 2020-07-19 NOTE — Telephone Encounter (Signed)
[employer] forms received. Sent to [employer]. 

## 2020-07-25 ENCOUNTER — Other Ambulatory Visit: Payer: Self-pay

## 2020-07-25 ENCOUNTER — Other Ambulatory Visit (HOSPITAL_COMMUNITY): Payer: Self-pay | Admitting: Psychiatry

## 2020-07-25 ENCOUNTER — Encounter (INDEPENDENT_AMBULATORY_CARE_PROVIDER_SITE_OTHER): Payer: Self-pay | Admitting: Family Medicine

## 2020-07-25 ENCOUNTER — Telehealth (HOSPITAL_COMMUNITY): Payer: Self-pay | Admitting: *Deleted

## 2020-07-25 ENCOUNTER — Ambulatory Visit (INDEPENDENT_AMBULATORY_CARE_PROVIDER_SITE_OTHER): Payer: 59 | Admitting: Family Medicine

## 2020-07-25 VITALS — BP 117/82 | HR 99 | Temp 98.2°F | Ht 72.0 in | Wt 331.0 lb

## 2020-07-25 DIAGNOSIS — Z0289 Encounter for other administrative examinations: Secondary | ICD-10-CM

## 2020-07-25 DIAGNOSIS — R0602 Shortness of breath: Secondary | ICD-10-CM | POA: Diagnosis not present

## 2020-07-25 DIAGNOSIS — F411 Generalized anxiety disorder: Secondary | ICD-10-CM

## 2020-07-25 DIAGNOSIS — F32A Depression, unspecified: Secondary | ICD-10-CM

## 2020-07-25 DIAGNOSIS — G4733 Obstructive sleep apnea (adult) (pediatric): Secondary | ICD-10-CM

## 2020-07-25 DIAGNOSIS — Z6841 Body Mass Index (BMI) 40.0 and over, adult: Secondary | ICD-10-CM

## 2020-07-25 DIAGNOSIS — E1165 Type 2 diabetes mellitus with hyperglycemia: Secondary | ICD-10-CM

## 2020-07-25 DIAGNOSIS — Z9189 Other specified personal risk factors, not elsewhere classified: Secondary | ICD-10-CM

## 2020-07-25 DIAGNOSIS — R5383 Other fatigue: Secondary | ICD-10-CM | POA: Diagnosis not present

## 2020-07-25 DIAGNOSIS — F331 Major depressive disorder, recurrent, moderate: Secondary | ICD-10-CM

## 2020-07-25 DIAGNOSIS — E559 Vitamin D deficiency, unspecified: Secondary | ICD-10-CM | POA: Diagnosis not present

## 2020-07-25 DIAGNOSIS — F419 Anxiety disorder, unspecified: Secondary | ICD-10-CM | POA: Diagnosis not present

## 2020-07-25 DIAGNOSIS — E1159 Type 2 diabetes mellitus with other circulatory complications: Secondary | ICD-10-CM | POA: Diagnosis not present

## 2020-07-25 DIAGNOSIS — F900 Attention-deficit hyperactivity disorder, predominantly inattentive type: Secondary | ICD-10-CM

## 2020-07-25 DIAGNOSIS — I152 Hypertension secondary to endocrine disorders: Secondary | ICD-10-CM

## 2020-07-25 MED ORDER — DULOXETINE HCL 60 MG PO CPEP: ORAL_CAPSULE | ORAL | 2 refills | Status: DC

## 2020-07-25 MED ORDER — ATOMOXETINE HCL 40 MG PO CAPS: 40.0000 mg | ORAL_CAPSULE | Freq: Every day | ORAL | 2 refills | Status: DC

## 2020-07-25 NOTE — Telephone Encounter (Signed)
VM from patient needing refills on her Cymbalta and Strattera. Reviewed record and she should be out. She would like them both called into [facility] on [address]. Will make provider aware.

## 2020-07-25 NOTE — Telephone Encounter (Signed)
Medications refilled and sent to preferred pharmacy.

## 2020-07-27 ENCOUNTER — Encounter (INDEPENDENT_AMBULATORY_CARE_PROVIDER_SITE_OTHER): Payer: Self-pay | Admitting: Family Medicine

## 2020-07-27 ENCOUNTER — Ambulatory Visit: Payer: 59 | Admitting: Physical Therapy

## 2020-07-30 NOTE — Telephone Encounter (Signed)
Please advise 

## 2020-07-31 NOTE — Progress Notes (Signed)
Dear Kelly Alexanders, MD,   Thank you for referring Kelly Mendez to our clinic. The following note includes my evaluation and treatment recommendations.  Chief Complaint:   OBESITY Kelly Mendez (MR# [id number]) is a 45 y.o. female who presents for evaluation and treatment of obesity and related comorbidities. Current BMI is Body mass index is 44.89 kg/m. Kelly Mendez has been struggling with her weight for many years and has been unsuccessful in either losing weight, maintaining weight loss, or reaching her healthy weight goal.  Kelly Mendez was referred by her PCP. She is a full time student at [employer]. Husband does indulge patient in foods she enjoys. At 11 am, she will have 2 sausage biscuits from home, coffee with sugar and 2-3 creamers. Around 2 pm, yogurt (great value) or 2 slices of cinnamon toast (satisfied). For dinner, Kelly Mendez is occasionally hungry and ready to eat spaghetti or chicken nuggets and fries.  Kelly Mendez is currently in the action stage of change and ready to dedicate time achieving and maintaining a healthier weight. Kelly Mendez is interested in becoming our patient and working on intensive lifestyle modifications including (but not limited to) diet and exercise for weight loss.  Kelly Mendez's habits were reviewed today and are as follows: Her family eats meals together, she thinks her family will eat healthier with her, she struggles with family and or coworkers weight loss sabotage, her desired weight loss is 93 lbs, she has been heavy most of her life, she started gaining weight 4th grade at 45 years old, her heaviest weight ever was 397 pounds, she has significant food cravings issues, she snacks frequently in the evenings, she wakes up frequently in the middle of the night to eat, she skips meals frequently, she is frequently drinking liquids with calories, she frequently makes poor food choices, she has problems with excessive hunger, she frequently eats larger  portions than normal, she has binge eating behaviors and she struggles with emotional eating.  Depression Screen Kelly Mendez's Food and Mood (modified PHQ-9) score was 19.  Depression screen PHQ 2/9 07/25/2020  Decreased Interest 2  Down, Depressed, Hopeless 2  PHQ - 2 Score 4  Altered sleeping 3  Tired, decreased energy 3  Change in appetite 3  Feeling bad or failure about yourself  2  Trouble concentrating 3  Moving slowly or fidgety/restless 1  Suicidal thoughts 0  PHQ-9 Score 19  Difficult doing work/chores Very difficult  Some encounter information is confidential and restricted. Go to Review Flowsheets activity to see all data.  Some recent data might be hidden   Subjective:   1. Other fatigue Kelly Mendez admits to daytime somnolence and admits to waking up still tired. Patent has a history of symptoms of daytime fatigue and morning fatigue. Kelly Mendez generally gets 7 or 8 hours of sleep per night, and states that she has difficulty falling asleep. Snoring is present. Apneic episodes is present. Epworth Sleepiness Score is 10. ECG-NSR at 93 bpm.  2. SOB (shortness of breath) on exertion Kelly Mendez notes increasing shortness of breath with exercising and seems to be worsening over time with weight gain. She notes getting out of breath sooner with activity than she used to. This has not gotten worse recently. Kelly Mendez denies shortness of breath at rest or orthopnea.  3. Type 2 diabetes mellitus with hyperglycemia, without long-term current use of insulin (HCC) Kelly Mendez is on metformin and trulicity (1.5 mg). Last A1c 7.0. Kelly Mendez has Dexcom. Blood sugar average 136 for last 90  days.  4. Hypertension associated with diabetes (HCC) Kelly Mendez is on losartan since 11/21. Blood pressure controlled today.  5. OSA (obstructive sleep apnea) Kelly Mendez is awaiting CPAP. She does not have a sleep doctor.  6. Vitamin D deficiency  Last Vitamin D level was 25.3. Kelly Mendez is not on Vitamin D supplement. She does note  fatigue.  7. Anxiety and depression Kelly Mendez is on Cymbalta, Vistoril, and trazodone. Symptoms are well controlled.   8. At risk for heart disease Kelly Mendez is at a higher than average risk for cardiovascular disease due to obesity.    Assessment/Plan:   1. Other fatigue - EKG 12-Lead - Vitamin B12 - Folate - T3 - T4  2. SOB (shortness of breath) on exertion  3. Type 2 diabetes mellitus with hyperglycemia, without long-term current use of insulin (HCC) - Insulin, random  4. Hypertension associated with diabetes (HCC) - Comprehensive metabolic panel  5. OSA (obstructive sleep apnea) Will follow up after receiving CPAP.  6. Vitamin D deficiency Will follow up at next appointment.  7. Anxiety and depression Behavior modification techniques were discussed today to help Annalysia deal with her emotional/non-hunger eating behaviors.  Orders and follow up as documented in patient record. Will follow up at next appointment.  8. At risk for heart disease Kelly Mendez was given approximately 15 minutes of coronary artery disease prevention counseling today. She is 45 y.o. female and has risk factors for heart disease including obesity. We discussed intensive lifestyle modifications today with an emphasis on specific weight loss instructions and strategies.   Repetitive spaced learning was employed today to elicit superior memory formation and behavioral change.  9. Class 3 severe obesity with serious comorbidity and body mass index (BMI) of 45.0 to 49.9 in adult, unspecified obesity type (HCC)  Kelly Mendez is currently in the action stage of change and her goal is to continue with weight loss efforts. I recommend Kelly Mendez begin the structured treatment plan as follows:  She has agreed to the Category 4 Plan.  Exercise goals: No exercise has been prescribed at this time.   Behavioral modification strategies: increasing lean protein intake, no skipping meals, meal planning and cooking strategies and  keeping healthy foods in the home.  She was informed of the importance of frequent follow-up visits to maximize her success with intensive lifestyle modifications for her multiple health conditions. She was informed we would discuss her lab results at her next visit unless there is a critical issue that needs to be addressed sooner. Kelly Mendez agreed to keep her next visit at the agreed upon time to discuss these results.  Objective:   Blood pressure 117/82, pulse 99, temperature 98.2 F (36.8 C), temperature source Oral, height 6' (1.829 m), weight (!) 331 lb (150.1 kg), last menstrual period 01/10/2017, SpO2 97 %. Body mass index is 44.89 kg/m.  EKG: Normal sinus rhythm, rate 93 bpm.  Indirect Calorimeter completed today shows a VO2 of 295 and a REE of 2053.  Her calculated basal metabolic rate is 8099 thus her basal metabolic rate is worse than expected.  General: Cooperative, alert, well developed, in no acute distress. HEENT: Conjunctivae and lids unremarkable. Cardiovascular: Regular rhythm.  Lungs: Normal work of breathing. Neurologic: No focal deficits.   Lab Results  Component Value Date   CREATININE 0.70 05/01/2020   BUN 10 05/01/2020   NA 140 05/01/2020   K 4.2 05/01/2020   CL 104 05/01/2020   CO2 25 05/01/2020   Lab Results  Component Value Date  ALT 24 05/01/2020   AST 17 05/01/2020   ALKPHOS 115 05/01/2020   BILITOT 0.5 05/01/2020   Lab Results  Component Value Date   HGBA1C 7.0 (H) 05/01/2020   HGBA1C 5.9 (A) 12/19/2019   HGBA1C 7.5 (A) 08/19/2019   HGBA1C 7.0 (A) 08/13/2018   HGBA1C 6.7 (A) 01/12/2018   No results found for: INSULIN Lab Results  Component Value Date   TSH 3.480 04/27/2020   Lab Results  Component Value Date   CHOL 109 04/27/2020   HDL 30 (L) 04/27/2020   LDLCALC 62 04/27/2020   TRIG 85 04/27/2020   CHOLHDL 3.6 04/27/2020   Lab Results  Component Value Date   WBC 7.4 05/01/2020   HGB 13.8 05/01/2020   HCT 43.4 05/01/2020   MCV  84.8 05/01/2020   PLT 325 05/01/2020   Lab Results  Component Value Date   FERRITIN 188 08/11/2017     Attestation Statements:   Reviewed by clinician on day of visit: allergies, medications, problem list, medical history, surgical history, family history, social history, and previous encounter notes.   This is the patient's first visit at [facility]. The patient's NEW PATIENT PACKET was reviewed at length. Included in the packet: current and past health history, medications, allergies, ROS, gynecologic history (women only), surgical history, family history, social history, weight history, weight loss surgery history (for those that have had weight loss surgery), nutritional evaluation, mood and food questionnaire, PHQ9, Epworth questionnaire, sleep habits questionnaire, patient life and health improvement goals questionnaire. These will all be scanned into the patient's chart under media.   During the visit, I independently reviewed the patient's EKG, bioimpedance scale results, and indirect calorimeter results. I used this information to tailor a meal plan for the patient that will help her to lose weight and will improve her obesity-related conditions going forward. I performed a medically necessary appropriate examination and/or evaluation. I discussed the assessment and treatment plan with the patient. The patient was provided an opportunity to ask questions and all were answered. The patient agreed with the plan and demonstrated an understanding of the instructions. Labs were ordered at this visit and will be reviewed at the next visit unless more critical results need to be addressed immediately. Clinical information was updated and documented in the EMR.   Time spent on visit including pre-visit chart review and post-visit care was 45 minutes.   A separate 15 minutes was spent on risk counseling (see above).   I, Para March, am acting as transcriptionist for  Coralie Common, MD.  I have reviewed the above documentation for accuracy and completeness, and I agree with the above. - Jinny Blossom, MD

## 2020-08-01 ENCOUNTER — Telehealth (INDEPENDENT_AMBULATORY_CARE_PROVIDER_SITE_OTHER): Payer: 59 | Admitting: Family Medicine

## 2020-08-01 ENCOUNTER — Telehealth: Payer: Self-pay | Admitting: Specialist

## 2020-08-01 ENCOUNTER — Telehealth: Payer: Self-pay

## 2020-08-01 ENCOUNTER — Encounter: Payer: Self-pay | Admitting: Family Medicine

## 2020-08-01 ENCOUNTER — Other Ambulatory Visit: Payer: Self-pay

## 2020-08-01 ENCOUNTER — Other Ambulatory Visit: Payer: Self-pay | Admitting: Specialist

## 2020-08-01 ENCOUNTER — Other Ambulatory Visit (INDEPENDENT_AMBULATORY_CARE_PROVIDER_SITE_OTHER): Payer: 59

## 2020-08-01 VITALS — BP 132/100 | HR 103 | Temp 101.0°F | Resp 16 | Wt 331.0 lb

## 2020-08-01 DIAGNOSIS — R112 Nausea with vomiting, unspecified: Secondary | ICD-10-CM

## 2020-08-01 DIAGNOSIS — R059 Cough, unspecified: Secondary | ICD-10-CM

## 2020-08-01 DIAGNOSIS — R509 Fever, unspecified: Secondary | ICD-10-CM

## 2020-08-01 DIAGNOSIS — R52 Pain, unspecified: Secondary | ICD-10-CM | POA: Diagnosis not present

## 2020-08-01 DIAGNOSIS — J029 Acute pharyngitis, unspecified: Secondary | ICD-10-CM

## 2020-08-01 LAB — POC INFLUENZA A&B (BINAX/QUICKVUE)
Influenza A, POC: NEGATIVE
Influenza B, POC: NEGATIVE

## 2020-08-01 LAB — POC COVID19 BINAXNOW: SARS Coronavirus 2 Ag: POSITIVE — AB

## 2020-08-01 NOTE — Telephone Encounter (Signed)
Patient called requesting a call back from Dr. Louanne Skye. Patient is asking due to covid symptoms and has questions for Dr. Louanne Skye. Patient does not want to cancel appt as of yet until a conversation is had with Dr. Louanne Skye. Please call patient at [phone].

## 2020-08-01 NOTE — Progress Notes (Signed)
   Subjective:  Documentation for virtual audio and video telecommunications through [place] encounter:  The patient was located at home. 2 patient identifiers used.  The provider was located in the office. The patient did consent to this visit and is aware of possible charges through their insurance for this visit.  The other persons participating in this telemedicine service were none. Time spent on call was 16 minutes and in review of previous records >20 minutes total.  This virtual service is not related to other E/M service within previous 7 days.    Patient ID: Kelly Mendez, female    DOB: [birth year], 45 y.o.   MRN: [id number]  HPI Chief Complaint  Patient presents with  . discuss long term disability    Wants to discuss this about her whole record taken into account and not just back- and surgery, possible covid- symptoms yesterday- cough, fever, dizziness, nauseated, sore throat, fatigue.    Complains of a 1 day history of fever, body aches, sore throat, nausea, vomiting, and coughing.  She is taking Robitussin multi-symptom which is helping her cough.  Takes diclofenac for back pain already.   She has underlying asthma.  Has not been using her rescue inhaler.  No chest pain, palpitations, shortness of breath, abdominal pain  Denies having Covid in the past.   She has 2 Moderna Covid vaccines.   States she is interested in applying for long term disability due to her back. States she has not been able to work steadily since being diagnosed with cancer.  States she is coming up on her 38-month short-term disability and wants to apply for long-term.  She has questions about this.   Review of Systems Pertinent positives and negatives in the history of present illness.     Objective:   Physical Exam BP (!) 132/100   Pulse (!) 103   Temp (!) 101 F (38.3 C) (Oral)   Resp 16   Wt (!) 331 lb (150.1 kg)   LMP 01/10/2017   BMI 44.89 kg/m   Alert  and oriented in no acute distress.  Respirations unlabored.  Speaking in complete sentences without difficulty.      Assessment & Plan:  Fever, unspecified fever cause - Plan: POC COVID-19 BinaxNow, Novel Coronavirus, NAA ([facility]), POC Influenza A&B(BINAX/QUICKVUE)  Body aches - Plan: POC COVID-19 BinaxNow, Novel Coronavirus, NAA ([facility]), POC Influenza A&B(BINAX/QUICKVUE)  Acute pharyngitis, unspecified etiology - Plan: POC COVID-19 BinaxNow, Novel Coronavirus, NAA ([facility]), POC Influenza A&B(BINAX/QUICKVUE)  Non-intractable vomiting with nausea, unspecified vomiting type - Plan: POC COVID-19 BinaxNow, Novel Coronavirus, NAA ([facility]), POC Influenza A&B(BINAX/QUICKVUE)  Cough - Plan: POC COVID-19 BinaxNow, Novel Coronavirus, NAA ([facility]), POC Influenza A&B(BINAX/QUICKVUE)  She will come to the office parking lot later this morning for rapid COVID, flu and PCR COVID testing. Discussed in depth supportive care including hydration, vitamins, Robitussin-DM, Tylenol and to let us know if she is worsening significantly.  She does have several underlying health conditions that make me have a lower threshold to refer her to the [facility] clinic for treatment if she is positive.  I will follow-up pending her results.

## 2020-08-01 NOTE — Telephone Encounter (Signed)
Patient is aware of Dr. Otho Ket message below and patient stated that she is positive for COVID.  Appt.has been R/S.

## 2020-08-01 NOTE — Telephone Encounter (Signed)
Pt. Aware that covid test was positive and to quarantine for 5 days since onset of symptoms, and to continue to quarantine 7-10 if symptoms are still occurring.

## 2020-08-01 NOTE — Telephone Encounter (Signed)
See message below.  Please advise.  Thank you.

## 2020-08-01 NOTE — Telephone Encounter (Signed)
Received $25.00 cash and disability paperwork from patient    Forwarding to [facility] today

## 2020-08-01 NOTE — Telephone Encounter (Signed)
This patient should not come to the office if she is having symptoms of COVID. She should reschedule her appointment for 2 weeks following the onset of her symptoms and after a negative over the counter COVID test. If there are any other questions they will be addressed at her next visit. jen

## 2020-08-02 ENCOUNTER — Telehealth: Payer: Self-pay | Admitting: Specialist

## 2020-08-02 ENCOUNTER — Ambulatory Visit: Payer: 59 | Admitting: Specialist

## 2020-08-02 LAB — COMPREHENSIVE METABOLIC PANEL
ALT: 34 IU/L — ABNORMAL HIGH (ref 0–32)
AST: 25 IU/L (ref 0–40)
Albumin/Globulin Ratio: 1.7 (ref 1.2–2.2)
Albumin: 4.8 g/dL (ref 3.8–4.8)
Alkaline Phosphatase: 141 IU/L — ABNORMAL HIGH (ref 44–121)
BUN/Creatinine Ratio: 16 (ref 9–23)
BUN: 14 mg/dL (ref 6–24)
Bilirubin Total: <0.2 mg/dL (ref 0.0–1.2)
CO2: 22 mmol/L (ref 20–29)
Calcium: 10 mg/dL (ref 8.7–10.2)
Chloride: 101 mmol/L (ref 96–106)
Creatinine, Ser: 0.86 mg/dL (ref 0.57–1.00)
GFR calc Af Amer: 95 mL/min/{1.73_m2} (ref >59)
GFR calc non Af Amer: 82 mL/min/{1.73_m2} (ref >59)
Globulin, Total: 2.8 g/dL (ref 1.5–4.5)
Glucose: 127 mg/dL — ABNORMAL HIGH (ref 65–99)
Potassium: 4.5 mmol/L (ref 3.5–5.2)
Sodium: 139 mmol/L (ref 134–144)
Total Protein: 7.6 g/dL (ref 6.0–8.5)

## 2020-08-02 LAB — T3: T3, Total: 93 ng/dL (ref 71–180)

## 2020-08-02 LAB — INSULIN, RANDOM: INSULIN: 46.1 u[IU]/mL — ABNORMAL HIGH (ref 2.6–24.9)

## 2020-08-02 LAB — FOLATE: Folate: 10.3 ng/mL (ref >3.0)

## 2020-08-02 LAB — T4: T4, Total: 5.7 ug/dL (ref 4.5–12.0)

## 2020-08-02 LAB — VITAMIN B12: Vitamin B-12: 508 pg/mL (ref 232–1245)

## 2020-08-02 NOTE — Telephone Encounter (Signed)
[employer] forms received. Sent to [employer]. 

## 2020-08-03 ENCOUNTER — Ambulatory Visit: Payer: 59 | Admitting: Physical Therapy

## 2020-08-08 ENCOUNTER — Encounter: Payer: Self-pay | Admitting: Family Medicine

## 2020-08-08 ENCOUNTER — Encounter (INDEPENDENT_AMBULATORY_CARE_PROVIDER_SITE_OTHER): Payer: Self-pay | Admitting: Family Medicine

## 2020-08-08 ENCOUNTER — Other Ambulatory Visit: Payer: Self-pay

## 2020-08-08 ENCOUNTER — Telehealth (INDEPENDENT_AMBULATORY_CARE_PROVIDER_SITE_OTHER): Payer: 59 | Admitting: Family Medicine

## 2020-08-08 DIAGNOSIS — E1165 Type 2 diabetes mellitus with hyperglycemia: Secondary | ICD-10-CM

## 2020-08-08 DIAGNOSIS — R7401 Elevation of levels of liver transaminase levels: Secondary | ICD-10-CM | POA: Diagnosis not present

## 2020-08-08 DIAGNOSIS — E559 Vitamin D deficiency, unspecified: Secondary | ICD-10-CM

## 2020-08-08 DIAGNOSIS — Z6841 Body Mass Index (BMI) 40.0 and over, adult: Secondary | ICD-10-CM

## 2020-08-08 MED ORDER — VITAMIN D (ERGOCALCIFEROL) 1.25 MG (50000 UNIT) PO CAPS: 50000.0000 [IU] | ORAL_CAPSULE | ORAL | 0 refills | Status: DC

## 2020-08-09 NOTE — Progress Notes (Signed)
TeleHealth Visit:  Due to the COVID-19 pandemic, this visit was completed with telemedicine (audio/video) technology to reduce patient and provider exposure as well as to preserve personal protective equipment.   Kelly Mendez has verbally consented to this TeleHealth visit. The patient is located at home, the provider is located at the [facility] office. The participants in this visit include the listed provider and patient. The visit was conducted today via MyChart Video.  Chief Complaint: OBESITY Kelly Mendez is here to discuss her progress with her obesity treatment plan along with follow-up of her obesity related diagnoses. Kelly Mendez is on the Category 4 Plan and states she is following her eating plan approximately 50% of the time. Kelly Mendez states she is not exercising.  Today's visit was #: 2 Starting weight: 331 lbs Starting date: 07/31/2020  Interim History: Kelly Mendez developed COVID between initial appointment and now. She felt breakfast and lunch were almost too much at times. Dinner felt appropriate size for her expectation.  Kelly Mendez use snack calories to add carb to dinner. Kelly Mendez has struggled to eat since getting COVID. She wants to get back to meal plan over next few weeks.   Subjective:   Transaminitis Kelly Mendez has hyperlipidemia and has been trying to improve her cholesterol levels with intensive lifestyle modification including a low saturated fat diet, exercise and weight loss. She denies any chest pain, claudication or myalgias. Kelly Mendez is not on statins  Lab Results  Component Value Date   ALT 34 (H) 08/01/2020   AST 25 08/01/2020   ALKPHOS 141 (H) 08/01/2020   BILITOT <0.2 08/01/2020   Lab Results  Component Value Date   CHOL 109 04/27/2020   HDL 30 (L) 04/27/2020   LDLCALC 62 04/27/2020   TRIG 85 04/27/2020   CHOLHDL 3.6 04/27/2020   Vitamin D deficiency  Kelly Mendez's Vitamin D level is 25.3. She is not on Vitamin D supplements. She endorses fatigue and is  recovering from [place].  Type 2 diabetes mellitus with hyperglycemia, without long-term current use of insulin (HCC) Medications reviewed. Diabetic ROS: no polyuria or polydipsia, no chest pain, dyspnea or TIA's, no numbness, tingling or pain in extremities. Kelly Mendez's last A1c was 7.0 and insulin 46.1. Kelly Mendez denies hypoglycemia episodes. She is on Trulicity and metformin and has some understanding of diabetes.   Lab Results  Component Value Date   HGBA1C 7.0 (H) 05/01/2020   HGBA1C 5.9 (A) 12/19/2019   HGBA1C 7.5 (A) 08/19/2019   Lab Results  Component Value Date   MICROALBUR 4.5 09/15/2016   LDLCALC 62 04/27/2020   CREATININE 0.86 08/01/2020   Lab Results  Component Value Date   INSULIN 46.1 (H) 08/01/2020     Assessment/Plan:   1. Transaminitis Cardiovascular risk and specific lipid/LDL goals reviewed.  We discussed several lifestyle modifications today and Vessie will continue to work on diet, exercise and weight loss efforts. Orders and follow up as documented in patient record. Will repeat CMP in 3 months.  Counseling Intensive lifestyle modifications are the first line treatment for this issue. . Dietary changes: Increase soluble fiber. Decrease simple carbohydrates. . Exercise changes: Moderate to vigorous-intensity aerobic activity 150 minutes per week if tolerated. . Lipid-lowering medications: see documented in medical record.  2. Vitamin D deficiency Low Vitamin D level contributes to fatigue and are associated with obesity, breast, and colon cancer. She agrees to continue to take prescription Vitamin D @50 ,000 IU every week and will follow-up for routine testing of Vitamin D, at least 2-3 times  per year to avoid over-replacement. We will refill Vitamin D 50,000 unit weekly #4 no refill.  - Vitamin D, Ergocalciferol, (DRISDOL) 1.25 MG (50000 UNIT) CAPS capsule; Take 1 capsule (50,000 Units total) by mouth every 7 (seven) days.  Dispense: 4 capsule; Refill: 0  3. Type  2 diabetes mellitus with hyperglycemia, without long-term current use of insulin (HCC) Good blood sugar control is important to decrease the likelihood of diabetic complications such as nephropathy, neuropathy, limb loss, blindness, coronary artery disease, and death. Intensive lifestyle modification including diet, exercise and weight loss are the first line of treatment for diabetes. Kelly Mendez will continue current medications with no change in dose. Will repeat labs in 3 months.  4. Class 3 severe obesity with serious comorbidity and body mass index (BMI) of 40.0 to 44.9 in adult, unspecified obesity type (HCC)  Kelly Mendez is currently in the action stage of change. As such, her goal is to continue with weight loss efforts. She has agreed to the Category 4 Plan.   Exercise goals: No exercise has been prescribed at this time.  Behavioral modification strategies: increasing lean protein intake, no skipping meals, meal planning and cooking strategies and better snacking choices.  Kelly Mendez has agreed to follow-up with our clinic in 2 weeks on 08/20/2020 at 3:00 pm. She was informed of the importance of frequent follow-up visits to maximize her success with intensive lifestyle modifications for her multiple health conditions.   Objective:   VITALS: Per patient if applicable, see vitals. GENERAL: Alert and in no acute distress. CARDIOPULMONARY: No increased WOB. Speaking in clear sentences.  PSYCH: Pleasant and cooperative. Speech normal rate and rhythm. Affect is appropriate. Insight and judgement are appropriate. Attention is focused, linear, and appropriate.  NEURO: Oriented as arrived to appointment on time with no prompting.   Lab Results  Component Value Date   CREATININE 0.86 08/01/2020   BUN 14 08/01/2020   NA 139 08/01/2020   K 4.5 08/01/2020   CL 101 08/01/2020   CO2 22 08/01/2020   Lab Results  Component Value Date   ALT 34 (H) 08/01/2020   AST 25 08/01/2020   ALKPHOS 141 (H)  08/01/2020   BILITOT <0.2 08/01/2020   Lab Results  Component Value Date   HGBA1C 7.0 (H) 05/01/2020   HGBA1C 5.9 (A) 12/19/2019   HGBA1C 7.5 (A) 08/19/2019   HGBA1C 7.0 (A) 08/13/2018   HGBA1C 6.7 (A) 01/12/2018   Lab Results  Component Value Date   INSULIN 46.1 (H) 08/01/2020   Lab Results  Component Value Date   TSH 3.480 04/27/2020   Lab Results  Component Value Date   CHOL 109 04/27/2020   HDL 30 (L) 04/27/2020   LDLCALC 62 04/27/2020   TRIG 85 04/27/2020   CHOLHDL 3.6 04/27/2020   Lab Results  Component Value Date   WBC 7.4 05/01/2020   HGB 13.8 05/01/2020   HCT 43.4 05/01/2020   MCV 84.8 05/01/2020   PLT 325 05/01/2020   Lab Results  Component Value Date   FERRITIN 188 08/11/2017    Attestation Statements:   Reviewed by clinician on day of visit: allergies, medications, problem list, medical history, surgical history, family history, social history, and previous encounter notes.    I, Para March, am acting as transcriptionist for Coralie Common, MD.  I have reviewed the above documentation for accuracy and completeness, and I agree with the above. - Jinny Blossom, MD

## 2020-08-10 ENCOUNTER — Other Ambulatory Visit: Payer: Self-pay | Admitting: *Deleted

## 2020-08-10 ENCOUNTER — Telehealth (INDEPENDENT_AMBULATORY_CARE_PROVIDER_SITE_OTHER): Payer: 59 | Admitting: Nurse Practitioner

## 2020-08-10 ENCOUNTER — Ambulatory Visit: Payer: 59 | Admitting: Physical Therapy

## 2020-08-10 ENCOUNTER — Telehealth: Payer: Self-pay | Admitting: *Deleted

## 2020-08-10 DIAGNOSIS — U071 COVID-19: Secondary | ICD-10-CM | POA: Diagnosis not present

## 2020-08-10 DIAGNOSIS — C50211 Malignant neoplasm of upper-inner quadrant of right female breast: Secondary | ICD-10-CM

## 2020-08-10 DIAGNOSIS — Z171 Estrogen receptor negative status [ER-]: Secondary | ICD-10-CM

## 2020-08-10 MED ORDER — PREDNISONE 20 MG PO TABS: 20.0000 mg | ORAL_TABLET | Freq: Every day | ORAL | 0 refills | Status: AC

## 2020-08-10 MED ORDER — ALBUTEROL SULFATE HFA 108 (90 BASE) MCG/ACT IN AERS: 2.0000 | INHALATION_SPRAY | Freq: Four times a day (QID) | RESPIRATORY_TRACT | 0 refills | Status: DC | PRN

## 2020-08-10 MED ORDER — AZITHROMYCIN 250 MG PO TABS: ORAL_TABLET | ORAL | 0 refills | Status: DC

## 2020-08-10 NOTE — Progress Notes (Signed)
Virtual Visit via Telephone Note  I connected with [place] on 08/10/20 at  2:30 PM EST by telephone and verified that I am speaking with the correct person using two identifiers.  Location: Patient: home Provider: remote   I discussed the limitations, risks, security and privacy concerns of performing an evaluation and management service by telephone and the availability of in person appointments. I also discussed with the patient that there may be a patient responsible charge related to this service. The patient expressed understanding and agreed to proceed.  Chief Complaint  Patient presents with  . Covid Positive    +1/19 Sx: fever (last night low grade 99-100),chills,  congestion, diarrhea. Was sx with sleep apnea Nov/Dec. Has not been able to obtain CPAP due to supply back order.       History of Present Illness:   Patient presents today for post COVID care clinic visit through televisit.  Patient was diagnosed with Covid on 08/01/2020.  She states that her symptoms started on 07/30/2020.  Patient states that she is having a low-grade fever at night, chills, congestion, diarrhea.  She states that she is having severe sinus congestion pressure and pain. Denies f/c/s, n/v/d, hemoptysis, PND, chest pain or edema.     Observations/Objective:  Vitals with BMI 08/01/2020 07/25/2020 07/02/2020  Height - 6\' 0"  6\' 2"   Weight 331 lbs 331 lbs 333 lbs 5 oz  BMI 44.88 44.31 54.00  Systolic 867 619 509  Diastolic 326 82 79  Pulse 103 99 96  Some encounter information is confidential and restricted. Go to Review Flowsheets activity to see all data.      Assessment and Plan:  Covid 19 Cough:   Stay well hydrated  Stay active  Deep breathing exercises  May start vitamin C 2,000 mg daily, vitamin D3 2,000 IU daily, Zinc 220 mg daily, and Quercetin 500 mg twice daily  May take tylenol or fever or pain  May take mucinex DM twice daily  Will order azithromycin  Will  order prednisone  Will order albuterol  Follow up:  Follow up in 1 weeks or sooner if needed    Follow Up Instructions:  1 week   I discussed the assessment and treatment plan with the patient. The patient was provided an opportunity to ask questions and all were answered. The patient agreed with the plan and demonstrated an understanding of the instructions.   The patient was advised to call back or seek an in-person evaluation if the symptoms worsen or if the condition fails to improve as anticipated.  I provided 23 minutes of non-face-to-face time during this encounter.   Fenton Foy, NP

## 2020-08-10 NOTE — Patient Instructions (Addendum)
Covid 19 Cough:   Stay well hydrated  Stay active  Deep breathing exercises  May start vitamin C 2,000 mg daily, vitamin D3 2,000 IU daily, Zinc 220 mg daily, and Quercetin 500 mg twice daily  May take tylenol or fever or pain  May take mucinex DM twice daily  Will order azithromycin  Will order prednisone  Will order albuterol  Follow up:  Follow up in 1 weeks or sooner if needed

## 2020-08-13 ENCOUNTER — Ambulatory Visit: Payer: 59 | Admitting: Oncology

## 2020-08-13 ENCOUNTER — Other Ambulatory Visit: Payer: 59

## 2020-08-14 ENCOUNTER — Telehealth (HOSPITAL_COMMUNITY): Payer: 59 | Admitting: Psychiatry

## 2020-08-17 ENCOUNTER — Ambulatory Visit: Payer: 59

## 2020-08-20 ENCOUNTER — Ambulatory Visit (INDEPENDENT_AMBULATORY_CARE_PROVIDER_SITE_OTHER): Payer: 59 | Admitting: Family Medicine

## 2020-08-20 ENCOUNTER — Encounter (INDEPENDENT_AMBULATORY_CARE_PROVIDER_SITE_OTHER): Payer: Self-pay | Admitting: Family Medicine

## 2020-08-20 ENCOUNTER — Other Ambulatory Visit: Payer: Self-pay

## 2020-08-20 VITALS — BP 117/70 | HR 109 | Temp 99.5°F | Ht 72.0 in | Wt 329.0 lb

## 2020-08-20 DIAGNOSIS — E1165 Type 2 diabetes mellitus with hyperglycemia: Secondary | ICD-10-CM

## 2020-08-20 DIAGNOSIS — E1159 Type 2 diabetes mellitus with other circulatory complications: Secondary | ICD-10-CM

## 2020-08-20 DIAGNOSIS — Z794 Long term (current) use of insulin: Secondary | ICD-10-CM | POA: Diagnosis not present

## 2020-08-20 DIAGNOSIS — I152 Hypertension secondary to endocrine disorders: Secondary | ICD-10-CM

## 2020-08-20 DIAGNOSIS — Z6841 Body Mass Index (BMI) 40.0 and over, adult: Secondary | ICD-10-CM

## 2020-08-21 NOTE — Progress Notes (Signed)
Chief Complaint:   OBESITY Kelly Mendez is here to discuss her progress with her obesity treatment plan along with follow-up of her obesity related diagnoses. Kelly Mendez is on the Category 4 Plan and states she is following her eating plan approximately 75% of the time. Kelly Mendez states she is walking more.  Today's visit was #: 3 Starting weight: 331 lbs Starting date: 07/31/2020 Today's weight: 329 lbs Today's date: 08/20/2020 Total lbs lost to date: 2 lbs Total lbs lost since last in-office visit: 2 lbs  Interim History: Pt is feeling better than last appointment. She has recovered from [place] and was on prednisone for 1 week, which increased her appetite. She is currently struggling with decreased appetite where she eats one meal (breakfast or lunch) and struggles to eat the other meal. She has had occasional indulgences but very sparingly.  Subjective:   1. Type 2 diabetes mellitus with hyperglycemia, with long-term current use of insulin (HCC) Kelly Mendez is on Trulicity and Metformin. She reports poor appetite during the day.  2. Hypertension associated with diabetes ([place]) Kelly Mendez's BP is controlled today.  Pt denies chest pain, chest pressure and headache. She is on losartan.  Assessment/Plan:   1. Type 2 diabetes mellitus with hyperglycemia, with long-term current use of insulin (HCC) Good blood sugar control is important to decrease the likelihood of diabetic complications such as nephropathy, neuropathy, limb loss, blindness, coronary artery disease, and death. Intensive lifestyle modification including diet, exercise and weight loss are the first line of treatment for diabetes. We may increase Trulicity to 3.0 mg at next appointment.  2. Hypertension associated with diabetes ([place]) Kelly Mendez is working on healthy weight loss and exercise to improve blood pressure control. We will watch for signs of hypotension as she continues her lifestyle modifications. Continue Losartan with no change in  dose. She is currently at goal.  3. Class 3 severe obesity with serious comorbidity and body mass index (BMI) of 40.0 to 44.9 in adult, unspecified obesity type (HCC) Kelly Mendez is currently in the action stage of change. As such, her goal is to continue with weight loss efforts. She has agreed to the Category 4 Plan.   Exercise goals: As is  Behavioral modification strategies: increasing lean protein intake, meal planning and cooking strategies, keeping healthy foods in the home and planning for success.  Kelly Mendez has agreed to follow-up with our clinic in 2 weeks. She was informed of the importance of frequent follow-up visits to maximize her success with intensive lifestyle modifications for her multiple health conditions.   Objective:   Blood pressure 117/70, pulse (!) 109, temperature 99.5 F (37.5 C), temperature source Oral, height 6' (1.829 m), weight (!) 329 lb (149.2 kg), last menstrual period 01/10/2017, SpO2 99 %. Body mass index is 44.62 kg/m.  General: Cooperative, alert, well developed, in no acute distress. HEENT: Conjunctivae and lids unremarkable. Cardiovascular: Regular rhythm.  Lungs: Normal work of breathing. Neurologic: No focal deficits.   Lab Results  Component Value Date   CREATININE 0.86 08/01/2020   BUN 14 08/01/2020   NA 139 08/01/2020   K 4.5 08/01/2020   CL 101 08/01/2020   CO2 22 08/01/2020   Lab Results  Component Value Date   ALT 34 (H) 08/01/2020   AST 25 08/01/2020   ALKPHOS 141 (H) 08/01/2020   BILITOT <0.2 08/01/2020   Lab Results  Component Value Date   HGBA1C 7.0 (H) 05/01/2020   HGBA1C 5.9 (A) 12/19/2019   HGBA1C 7.5 (A) 08/19/2019  HGBA1C 7.0 (A) 08/13/2018   HGBA1C 6.7 (A) 01/12/2018   Lab Results  Component Value Date   INSULIN 46.1 (H) 08/01/2020   Lab Results  Component Value Date   TSH 3.480 04/27/2020   Lab Results  Component Value Date   CHOL 109 04/27/2020   HDL 30 (L) 04/27/2020   LDLCALC 62 04/27/2020   TRIG 85  04/27/2020   CHOLHDL 3.6 04/27/2020   Lab Results  Component Value Date   WBC 7.4 05/01/2020   HGB 13.8 05/01/2020   HCT 43.4 05/01/2020   MCV 84.8 05/01/2020   PLT 325 05/01/2020   Lab Results  Component Value Date   FERRITIN 188 08/11/2017    Attestation Statements:   Reviewed by clinician on day of visit: allergies, medications, problem list, medical history, surgical history, family history, social history, and previous encounter notes.  Time spent on visit including pre-visit chart review and post-visit care and charting was 15 minutes.   Coral Ceo, am acting as transcriptionist for Coralie Common, MD.   I have reviewed the above documentation for accuracy and completeness, and I agree with the above. - Jinny Blossom, MD

## 2020-08-22 NOTE — Telephone Encounter (Signed)
ERROR Gardiner Rhyme, RN

## 2020-08-24 ENCOUNTER — Encounter: Payer: Self-pay | Admitting: Specialist

## 2020-08-24 ENCOUNTER — Ambulatory Visit (INDEPENDENT_AMBULATORY_CARE_PROVIDER_SITE_OTHER): Payer: 59 | Admitting: Specialist

## 2020-08-24 ENCOUNTER — Other Ambulatory Visit: Payer: Self-pay

## 2020-08-24 VITALS — BP 132/93 | HR 134 | Ht 72.0 in | Wt 329.0 lb

## 2020-08-24 DIAGNOSIS — M5136 Other intervertebral disc degeneration, lumbar region: Secondary | ICD-10-CM | POA: Diagnosis not present

## 2020-08-24 DIAGNOSIS — Z9889 Other specified postprocedural states: Secondary | ICD-10-CM

## 2020-08-24 DIAGNOSIS — M4807 Spinal stenosis, lumbosacral region: Secondary | ICD-10-CM | POA: Diagnosis not present

## 2020-08-24 DIAGNOSIS — M51369 Other intervertebral disc degeneration, lumbar region without mention of lumbar back pain or lower extremity pain: Secondary | ICD-10-CM

## 2020-08-24 DIAGNOSIS — M4726 Other spondylosis with radiculopathy, lumbar region: Secondary | ICD-10-CM | POA: Diagnosis not present

## 2020-08-24 NOTE — Progress Notes (Signed)
Office Visit Note   Patient: Kelly Mendez           Date of Birth: Jun 29, 1976           MRN: [id number] Visit Date: 08/24/2020              Requested by: Denita Lung, MD [place],  [place] [id number] PCP: Denita Lung, MD   Assessment & Plan: Visit Diagnoses:  1. Status post lumbar laminectomy   2. Degenerative disc disease, lumbar   3. Spinal stenosis of lumbosacral region   4. Other spondylosis with radiculopathy, lumbar region     Plan: Avoid bending, stooping and avoid lifting weights greater than 10 lbs. Avoid prolong standing and walking. Avoid frequent bending and stooping  No lifting greater than 10 lbs. May use ice or moist heat for pain. Weight loss is of benefit. Use of diclofenac for back pain and pain due to inflamation and scar irritation and inflamation. Walking and exercise is recommended to improve your endurance to lumbar disc degeneration and post surgical discomforts.  Follow-Up Instructions: No follow-ups on file.   Orders:  No orders of the defined types were placed in this encounter.  No orders of the defined types were placed in this encounter.     Procedures: No procedures performed   Clinical Data: No additional findings.   Subjective: Chief Complaint  Patient presents with  . Lower Back - Follow-up    45 year old female post bilateral lumbar laminectomy with discectomy L5-S1. She had come down with COVID at her last visit and it was cancelled. She reports the pain now is in her back and there is a circle of pain at the bone level, deep discomfort and this is present with bending and stooping and leaning forward feels okay and reaching up is not painful. Repetitive bending increases the pain. She is able to walk about 15 min at a time. The cane is for balance and for feeling of dragging the right leg with walking and she does tend to lean to the side without the cane. She completed PT and directions for  continuing a HEP and water aerobics at the [facility], to improve her physio.     Review of Systems  Constitutional: Positive for activity change and fever. Negative for appetite change, chills, diaphoresis, fatigue and unexpected weight change.  HENT: Positive for congestion. Negative for dental problem, drooling, ear discharge, ear pain, facial swelling, hearing loss, mouth sores, nosebleeds, postnasal drip, rhinorrhea, sinus pressure, sinus pain, sneezing, sore throat, tinnitus, trouble swallowing and voice change.   Eyes: Negative.  Negative for photophobia, pain, discharge, redness, itching and visual disturbance.  Respiratory: Positive for apnea, shortness of breath and wheezing. Negative for cough, choking, chest tightness and stridor.   Cardiovascular: Negative.   Gastrointestinal: Negative.  Negative for abdominal distention, abdominal pain, anal bleeding, blood in stool, constipation, diarrhea, nausea, rectal pain and vomiting.  Endocrine: Negative.  Negative for cold intolerance, heat intolerance, polydipsia, polyphagia and polyuria.  Genitourinary: Negative for difficulty urinating, dyspareunia, dysuria, enuresis, flank pain and frequency.  Musculoskeletal: Positive for back pain. Negative for arthralgias, gait problem, joint swelling, myalgias, neck pain and neck stiffness.  Skin: Negative.   Allergic/Immunologic: Negative.   Neurological: Negative.  Negative for dizziness, tremors, seizures, syncope, facial asymmetry, speech difficulty, weakness, light-headedness, numbness and headaches.  Hematological: Negative.   Psychiatric/Behavioral: Negative for agitation, behavioral problems, confusion, decreased concentration, dysphoric mood, hallucinations, self-injury, sleep disturbance and suicidal  ideas. The patient is not nervous/anxious and is not hyperactive.      Objective: Vital Signs: BP (!) 132/93 (BP Location: Left Arm, Patient Position: Sitting)   Pulse (!) 134   Ht 6' (1.829 m)    Wt (!) 329 lb (149.2 kg)   LMP 01/10/2017   BMI 44.62 kg/m   Physical Exam Constitutional:      Appearance: She is well-developed and well-nourished.  HENT:     Head: Normocephalic and atraumatic.  Eyes:     Extraocular Movements: EOM normal.     Pupils: Pupils are equal, round, and reactive to light.  Pulmonary:     Effort: Pulmonary effort is normal.     Breath sounds: Normal breath sounds.  Abdominal:     General: Bowel sounds are normal.     Palpations: Abdomen is soft.  Musculoskeletal:     Cervical back: Normal range of motion and neck supple.     Lumbar back: Negative right straight leg raise test and negative left straight leg raise test.  Skin:    General: Skin is warm and dry.  Neurological:     Mental Status: She is alert and oriented to person, place, and time.  Psychiatric:        Mood and Affect: Mood and affect normal.        Behavior: Behavior normal.        Thought Content: Thought content normal.        Judgment: Judgment normal.     Back Exam   Tenderness  The patient is experiencing tenderness in the lumbar.  Range of Motion  Extension: abnormal  Flexion: abnormal  Lateral bend right: abnormal  Lateral bend left: abnormal  Rotation right: abnormal  Rotation left: abnormal   Muscle Strength  Right Quadriceps:  5/5  Left Quadriceps:  5/5  Right Hamstrings:  5/5  Left Hamstrings:  5/5   Tests  Straight leg raise right: negative Straight leg raise left: negative  Reflexes  Patellar: 0/4 Achilles: 0/4  Other  Toe walk: abnormal Heel walk: abnormal  Comments:  Hip ROM is normal       Specialty Comments:  No specialty comments available.  Imaging: No results found.   PMFS History: Patient Active Problem List   Diagnosis Date Noted  . Herniation of lumbar intervertebral disc with radiculopathy 05/04/2020    Priority: High    Class: Chronic  . Spinal stenosis of lumbar region 05/04/2020    Priority: High  . COVID-19  08/10/2020  . OSA (obstructive sleep apnea) 06/19/2020  . Insomnia 06/19/2020  . Lumbar disc herniation with radiculopathy 05/04/2020  . Vitamin D deficiency 04/27/2020  . Witnessed episode of apnea 04/27/2020  . At risk for sleep apnea 04/27/2020  . Elevated blood pressure reading in office with diagnosis of hypertension 04/27/2020  . Attention deficit hyperactivity disorder (ADHD), predominantly inattentive type 04/11/2020  . Moderate episode of recurrent major depressive disorder ([place]) 04/11/2020  . Generalized anxiety disorder   . Night sweats 10/24/2019  . Chronic right-sided low back pain with right-sided sciatica 10/24/2019  . Fatigue 10/24/2019  . Type 2 diabetes mellitus with hyperglycemia, without long-term current use of insulin ([place]) 08/19/2019  . Hot flashes 08/13/2018  . Snoring 04/15/2018  . Post traumatic stress disorder (PTSD) 02/25/2018  . Anxiety   . Asymptomatic microscopic hematuria 09/29/2017  . Iron deficiency anemia 08/06/2017  . Adnexal mass, RIGHT 12/16/2016  . Menorrhagia 12/16/2016  . Genetic testing 12/15/2016  . Family  history of breast cancer   . Family history of ovarian cancer   . Family history of neurofibromatosis   . Acute deep vein thrombosis (DVT) of axillary vein of right upper extremity ([place]) 11/26/2016  . Type 2 diabetes mellitus with diabetic polyneuropathy, without long-term current use of insulin ([place])   . Malignant neoplasm of upper-inner quadrant of right breast in female, estrogen receptor negative ([place]) 10/21/2016  . Low HDL (under 40) 09/16/2016  . ASCVD (arteriosclerotic cardiovascular disease) 09/16/2016  . Light cigarette smoker 09/15/2016  . Morbid obesity ([place]) 09/15/2016  . Mild intermittent asthma 09/15/2016   Past Medical History:  Diagnosis Date  . Abnormal Pap smear of cervix   . ADHD   . Anemia   . Anxiety   . Asthma   . Asymptomatic microscopic hematuria 09/29/2017  . Back pain   . Bilateral swelling of feet   .  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 Concussion    around age 10  . Depression   . Diabetes type 2, uncontrolled ([place])    new diagnosis in 08/2016  . DVT (deep venous thrombosis) ([place]) 11/24/2016   in rt arm  . Family history of breast cancer   . Family history of neurofibromatosis   . Family history of ovarian cancer   . History of radiation therapy 04/21/17- 05/20/17   Right Breast 40.05 Gy in 15 fractions followed by a 10 Gy boost in 5 fractions to yield a total dose of 50.05 Gy  . HPV in female   . Hyperlipemia   . Hypertension   . Malignant neoplasm of upper-inner quadrant of right breast in female, estrogen receptor negative ([place]) 10/21/2016  . Neuromuscular disorder (HCC)    neuropathy  . Pelvic mass in female 06/16/2017  . Personal history of chemotherapy 2018  . Personal history of radiation therapy 2018  . Pneumonia    2014   . PTSD (post-traumatic stress disorder)   . Sciatica   . Sleep apnea   . SOB (shortness of breath) on exertion   . Vitamin D deficiency     Family History  Problem Relation Age of Onset  . Breast cancer Mother 49  . Depression Mother   . Drug abuse Mother   . Hepatitis C Father   . Diabetes Father   . Hypertension Father   . Drug abuse Father   . Obesity Father   . Ovarian cancer Maternal Aunt        dx in her 39s  . Neurofibromatosis Maternal Uncle   . Brain cancer Maternal Uncle 58  . Lung cancer Maternal Grandfather   . Kidney failure Paternal Grandmother   . Heart attack Paternal Grandfather   . Ovarian cancer Maternal Aunt        dx in her 45s  . Neurofibromatosis Maternal Aunt   . Ovarian cancer Maternal Aunt   . Neurofibromatosis Maternal Aunt   . Cervical cancer Maternal Aunt   . Neurofibromatosis Maternal Aunt   . Cancer Maternal Aunt        cancer on the bottom of her foot  . Breast cancer Other        MGF's sisters  . Neurofibromatosis Other        MGM's paternal aunt    Past Surgical  History:  Procedure Laterality Date  . BREAST LUMPECTOMY Right 03/18/2017  . BREAST LUMPECTOMY WITH RADIOACTIVE SEED AND SENTINEL LYMPH NODE BIOPSY Right  03/18/2017   Procedure: RIGHT BREAST LUMPECTOMY WITH RADIOACTIVE SEED AND RIGHT SENTINEL LYMPH NODE BIOPSY;  Surgeon: [name], MD;  Location: [facility];  Service: General;  Laterality: Right;  . COLPOSCOPY    . left ovary removed  1978   was told her ovary was removed but Dr. Denman George found that was not the case. Suspect this was originally an ovarian cystectomy  . LUMBAR LAMINECTOMY/DECOMPRESSION MICRODISCECTOMY N/A 05/04/2020   Procedure: BILATERAL LUMBAR FIVE THROUGH SACRAL ONE MICRODISCECTOMY;  Surgeon: Jessy Oto, MD;  Location: [facility];  Service: Orthopedics;  Laterality: N/A;  . PORTACATH PLACEMENT Right 10/29/2016   Procedure: INSERTION PORT-A-CATH WITH [place];  Surgeon: [name], MD;  Location: [facility];  Service: General;  Laterality: Right;  . portacath removal     oct. 5 2018  . ROBOTIC ASSISTED TOTAL HYSTERECTOMY Bilateral 06/16/2017   Procedure: XI ROBOTIC ASSISTED TOTAL HYSTERECTOMY WITH BILATERAL SALPINGECTOMY,  OOPHERECTOMY;  Surgeon: [name], MD;  Location: [facility];  Service: Gynecology;  Laterality: Bilateral;   Social History   Occupational History  . Not on file  Tobacco Use  . Smoking status: Former Smoker    Packs/day: 0.15    Years: 25.00    Pack years: 3.75    Types: Cigarettes    Quit date: 11/25/2016    Years since quitting: 3.7  . Smokeless tobacco: Never Used  Vaping Use  . Vaping Use: Never used  Substance and Sexual Activity  . Alcohol use: Yes    Alcohol/week: 1.0 standard drink    Types: 1 Glasses of wine per week    Comment: rarely   . Drug use: No  . Sexual activity: Not Currently    Birth control/protection: Surgical    Comment: husband vasectomy

## 2020-08-24 NOTE — Patient Instructions (Signed)
Avoid bending, stooping and avoid lifting weights greater than 10 lbs. Avoid prolong standing and walking. Avoid frequent bending and stooping  No lifting greater than 10 lbs. May use ice or moist heat for pain. Weight loss is of benefit. Use of diclofenac for back pain and pain due to inflamation and scar irritation and inflamation. Walking and exercise is recommended to improve your endurance to lumbar disc degeneration and post surgical discomforts.

## 2020-08-29 ENCOUNTER — Telehealth: Payer: Self-pay | Admitting: Specialist

## 2020-08-29 NOTE — Telephone Encounter (Signed)
[employer] forms received. Sent to [employer]. 

## 2020-08-30 ENCOUNTER — Other Ambulatory Visit: Payer: Self-pay

## 2020-08-30 ENCOUNTER — Ambulatory Visit (INDEPENDENT_AMBULATORY_CARE_PROVIDER_SITE_OTHER): Payer: 59 | Admitting: Licensed Clinical Social Worker

## 2020-08-30 DIAGNOSIS — F411 Generalized anxiety disorder: Secondary | ICD-10-CM | POA: Diagnosis not present

## 2020-08-31 NOTE — Progress Notes (Signed)
   THERAPIST PROGRESS NOTE   Virtual Visit via Video Note  I connected with Kelly Mendez on 08/30/20 at 11:00 AM EST by a video enabled telemedicine application and verified that I am speaking with the correct person using two identifiers.  Location: Patient: Home Provider: [facility]   I discussed the limitations of evaluation and management by telemedicine and the availability of in person appointments. The patient expressed understanding and agreed to proceed. I discussed the assessment and treatment plan with the patient. The patient was provided an opportunity to ask questions and all were answered. The patient agreed with the plan and demonstrated an understanding of the instructions.   I provided 45 minutes of non-face-to-face time during this encounter.  Participation Level: Active  Behavioral Response: CasualAlertMildly anxious  Type of Therapy: Individual Therapy  Treatment Goals addressed: Anxiety and Coping  Interventions: Supportive  Summary: Kelly Mendez is a 45 y.o. female who presents with hx of GAD. This date pt signs on for video session per her preference. She is lying across her bed but very alert and engaged. She states she continues to be better from back surgery but she has some hip pain when she sits too long so she is lying down. Pt last seen 12/23. Overall assessment of significant changes reveals pt has returned to school. She states she is doing well and just got 101 on exam she took yesterday, has another exam today. Congratulated pt for her success. 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 Reports her husband has found work with another truck driving job. She states he is gone 2 wks and home 4 days. Pt states "I am having to revisit forgiveness and trust". Spouse has been unfaithful in past. She reports they have also tried a polyamorous relationship that went poorly yet she  states they remain open to trying again. LCSW assisted to process thoughts/feelings/choices. Pt reports she is taking meds as prescribed with exception of ADHD med. Pt advises she has been off this med for ~3 wks d/t cost ($60). Pt reports she is using Product/process development scientist. LCSW provided referral to [place]. LCSW assessed for other worries/concerns/changes. Pt provides info on her two stepsons, ages 18 and 72. She states bio mom is "out of the picture". She advises bio mom not keeping children's best interest/safety a priority. Pt further advises the 45 yr old "came out" to her saying he was gay. She states he was afraid to tell father but did after speaking with her and father handled it well saying he loved son regardless. Son has started dating and boyfriend has been welcomed over to the home. She states she has a much better relationship with the boys than when they first moved in and feels good about it. Pt denies other concerns. LCSW reviewed poc and coping prior to close of session. Pt states appreciation for care.     Suicidal/Homicidal: Nowithout intent/plan  Therapist Response: Pt remains receptive to care.  Plan: Return again in ~2 weeks.  Diagnosis: Axis I: Generalized Anxiety Disorder    Axis II: Deferred  Hermine Messick, LCSW 08/31/2020

## 2020-09-03 ENCOUNTER — Other Ambulatory Visit: Payer: Self-pay

## 2020-09-03 ENCOUNTER — Inpatient Hospital Stay (HOSPITAL_BASED_OUTPATIENT_CLINIC_OR_DEPARTMENT_OTHER): Payer: 59 | Admitting: Oncology

## 2020-09-03 ENCOUNTER — Inpatient Hospital Stay: Payer: 59 | Attending: Oncology

## 2020-09-03 VITALS — BP 124/88 | HR 101 | Temp 97.3°F | Resp 20 | Ht 72.0 in | Wt 335.2 lb

## 2020-09-03 DIAGNOSIS — Z171 Estrogen receptor negative status [ER-]: Secondary | ICD-10-CM | POA: Insufficient documentation

## 2020-09-03 DIAGNOSIS — C50211 Malignant neoplasm of upper-inner quadrant of right female breast: Secondary | ICD-10-CM

## 2020-09-03 DIAGNOSIS — F4310 Post-traumatic stress disorder, unspecified: Secondary | ICD-10-CM | POA: Diagnosis not present

## 2020-09-03 DIAGNOSIS — Z8616 Personal history of COVID-19: Secondary | ICD-10-CM | POA: Diagnosis not present

## 2020-09-03 DIAGNOSIS — Z87891 Personal history of nicotine dependence: Secondary | ICD-10-CM | POA: Diagnosis not present

## 2020-09-03 DIAGNOSIS — J45909 Unspecified asthma, uncomplicated: Secondary | ICD-10-CM | POA: Insufficient documentation

## 2020-09-03 DIAGNOSIS — Z923 Personal history of irradiation: Secondary | ICD-10-CM | POA: Insufficient documentation

## 2020-09-03 DIAGNOSIS — Z9221 Personal history of antineoplastic chemotherapy: Secondary | ICD-10-CM | POA: Diagnosis not present

## 2020-09-03 DIAGNOSIS — N951 Menopausal and female climacteric states: Secondary | ICD-10-CM | POA: Diagnosis not present

## 2020-09-03 DIAGNOSIS — E1165 Type 2 diabetes mellitus with hyperglycemia: Secondary | ICD-10-CM

## 2020-09-03 DIAGNOSIS — I890 Lymphedema, not elsewhere classified: Secondary | ICD-10-CM | POA: Diagnosis not present

## 2020-09-03 DIAGNOSIS — E559 Vitamin D deficiency, unspecified: Secondary | ICD-10-CM | POA: Diagnosis not present

## 2020-09-03 LAB — CBC WITH DIFFERENTIAL (CANCER CENTER ONLY)
Abs Immature Granulocytes: 0.02 10*3/uL (ref 0.00–0.07)
Basophils Absolute: 0 10*3/uL (ref 0.0–0.1)
Basophils Relative: 1 %
Eosinophils Absolute: 0.2 10*3/uL (ref 0.0–0.5)
Eosinophils Relative: 3 %
HCT: 41.5 % (ref 36.0–46.0)
Hemoglobin: 13 g/dL (ref 12.0–15.0)
Immature Granulocytes: 0 %
Lymphocytes Relative: 25 %
Lymphs Abs: 1.5 10*3/uL (ref 0.7–4.0)
MCH: 26.3 pg (ref 26.0–34.0)
MCHC: 31.3 g/dL (ref 30.0–36.0)
MCV: 84 fL (ref 80.0–100.0)
Monocytes Absolute: 0.3 10*3/uL (ref 0.1–1.0)
Monocytes Relative: 4 %
Neutro Abs: 4.2 10*3/uL (ref 1.7–7.7)
Neutrophils Relative %: 67 %
Platelet Count: 244 10*3/uL (ref 150–400)
RBC: 4.94 MIL/uL (ref 3.87–5.11)
RDW: 14.2 % (ref 11.5–15.5)
WBC Count: 6.2 10*3/uL (ref 4.0–10.5)
nRBC: 0 % (ref 0.0–0.2)

## 2020-09-03 LAB — CMP (CANCER CENTER ONLY)
ALT: 28 U/L (ref 0–44)
AST: 16 U/L (ref 15–41)
Albumin: 3.8 g/dL (ref 3.5–5.0)
Alkaline Phosphatase: 117 U/L (ref 38–126)
Anion gap: 11 (ref 5–15)
BUN: 13 mg/dL (ref 6–20)
CO2: 24 mmol/L (ref 22–32)
Calcium: 9.4 mg/dL (ref 8.9–10.3)
Chloride: 103 mmol/L (ref 98–111)
Creatinine: 0.82 mg/dL (ref 0.44–1.00)
GFR, Estimated: >60 mL/min (ref >60)
Glucose, Bld: 222 mg/dL — ABNORMAL HIGH (ref 70–99)
Potassium: 4.3 mmol/L (ref 3.5–5.1)
Sodium: 138 mmol/L (ref 135–145)
Total Bilirubin: 0.4 mg/dL (ref 0.3–1.2)
Total Protein: 6.9 g/dL (ref 6.5–8.1)

## 2020-09-03 NOTE — Progress Notes (Signed)
[place]  [phone] [phone]) [phone]     ID: Kelly Mendez DOB: September 26, 1975  MR#: [id number]  YIA#:[id number]  Patient Care Team: Denita Lung, MD as PCP - General (Family Medicine) [name], MD as Consulting Physician (General Surgery) Magrinat, Virgie Dad, MD as Consulting Physician (Oncology) Eppie Gibson, MD as Attending Physician (Radiation Oncology) Buena Irish, LCSW as Social Worker (Licensed Clinical Social Worker) [name], MD as Consulting Physician (Gynecologic Oncology) OTHER MD:   CHIEF COMPLAINT: Triple negative breast cancer; DVT  CURRENT TREATMENT: Observation   INTERVAL HISTORY: Kelly Mendez returns today for follow-up of her triple negative breast cancer. She continues under observation.  Since her last visit, she underwent bilateral diagnostic mammography with tomography and right breast ultrasonography at [facility] on 11/25/2019 showing: breast density category B; indeterminate asymmetry associated with 0.7 cm mass in the 9 o'clock location of right breast; negative left breast.  She proceeded to biopsy of the right breast area in question on 12/13/2019. Pathology [phone]) showed a benign lymph node.  She also underwent microdiscectomy on 05/04/2020 under Dr. Louanne Skye.   REVIEW OF SYSTEMS: Kelly Mendez still has not quite recovered from her Covid infection.  She in addition has been diagnosed with sleep apnea and that is complicating things a little bit for her.  She is having more hot flashes than before.  She was switched to Cymbalta and misses the Effexor she says.  Of course she understands that we cannot do both drugs easily.  She thinks her right arm has swollen and she is having some lymphedema.  She continues to have issues with PTSD.  As far as the Covid is concerned she said it was like a "chemotherapy with a fever".  The entire family had it.  They all have recovered.  A detailed review of systems was otherwise  stable   COVID 19 VACCINATION STATUS: Moderna x1 in 01/2020; infection 07/2020   BREAST CANCER HISTORY: From the original intake note:  Kelly Mendez had her first ever mammogram 10/07/2016 at [facility]. This showed a possible mass in the right breast. On 10/13/2016 she underwent bilateral diagnostic mammography with tomography and right breast ultrasonography. This found the breast density to be category B. In the upper inner quadrant of the right breast there was a circumscribed mass which was barely palpable. Ultrasonography confirmed a 0.9 cm right breast mass at the 1:00 radiant 18 cm from the nipple ultrasound of the axilla showed 1 indeterminate right axillary lymph node with borderline thickening of the anterior cortex.  On 10/16/2016 the patient underwent biopsy of the right breast mass and the suspicious right axillary lymph node. The right breast mass proved to be an invasive ductal carcinoma, grade 3, estrogen and progesterone receptor negative, with an MIB-1 of 80%, and no HER-2 amplification, the signals ratio being 1.30 and the number per cell 1.75. The lymph node was negative and concordant.  Her subsequent history is as detailed below   PAST MEDICAL HISTORY: Past Medical History:  Diagnosis Date  . Abnormal Pap smear of cervix   . ADHD   . Anemia   . Anxiety   . Asthma   . Asymptomatic microscopic hematuria 09/29/2017  . Back pain   . Bilateral swelling of feet   . 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 Concussion    around age 84  . Depression   . Diabetes type 2, uncontrolled ([place])  new diagnosis in 08/2016  . DVT (deep venous thrombosis) ([place]) 11/24/2016   in rt arm  . Family history of breast cancer   . Family history of neurofibromatosis   . Family history of ovarian cancer   . History of radiation therapy 04/21/17- 05/20/17   Right Breast 40.05 Gy in 15 fractions followed by a 10 Gy boost in 5 fractions to yield a total dose of  50.05 Gy  . HPV in female   . Hyperlipemia   . Hypertension   . Malignant neoplasm of upper-inner quadrant of right breast in female, estrogen receptor negative ([place]) 10/21/2016  . Neuromuscular disorder (HCC)    neuropathy  . Pelvic mass in female 06/16/2017  . Personal history of chemotherapy 2018  . Personal history of radiation therapy 2018  . Pneumonia    2014   . PTSD (post-traumatic stress disorder)   . Sciatica   . Sleep apnea   . SOB (shortness of breath) on exertion   . Vitamin D deficiency     PAST SURGICAL HISTORY: Past Surgical History:  Procedure Laterality Date  . BREAST LUMPECTOMY Right 03/18/2017  . BREAST LUMPECTOMY WITH RADIOACTIVE SEED AND SENTINEL LYMPH NODE BIOPSY Right 03/18/2017   Procedure: RIGHT BREAST LUMPECTOMY WITH RADIOACTIVE SEED AND RIGHT SENTINEL LYMPH NODE BIOPSY;  Surgeon: [name], MD;  Location: [facility];  Service: General;  Laterality: Right;  . COLPOSCOPY    . left ovary removed  1978   was told her ovary was removed but Dr. Denman George found that was not the case. Suspect this was originally an ovarian cystectomy  . LUMBAR LAMINECTOMY/DECOMPRESSION MICRODISCECTOMY N/A 05/04/2020   Procedure: BILATERAL LUMBAR FIVE THROUGH SACRAL ONE MICRODISCECTOMY;  Surgeon: Jessy Oto, MD;  Location: [facility];  Service: Orthopedics;  Laterality: N/A;  . PORTACATH PLACEMENT Right 10/29/2016   Procedure: INSERTION PORT-A-CATH WITH [place];  Surgeon: [name], MD;  Location: [facility];  Service: General;  Laterality: Right;  . portacath removal     oct. 5 2018  . ROBOTIC ASSISTED TOTAL HYSTERECTOMY Bilateral 06/16/2017   Procedure: XI ROBOTIC ASSISTED TOTAL HYSTERECTOMY WITH BILATERAL SALPINGECTOMY,  OOPHERECTOMY;  Surgeon: [name], MD;  Location: [facility];  Service: Gynecology;  Laterality: Bilateral;    FAMILY HISTORY Family History  Problem Relation Age of Onset  . Breast cancer Mother 2  . Depression Mother   . Drug abuse Mother   .  Hepatitis C Father   . Diabetes Father   . Hypertension Father   . Drug abuse Father   . Obesity Father   . Ovarian cancer Maternal Aunt        dx in her 27s  . Neurofibromatosis Maternal Uncle   . Brain cancer Maternal Uncle 58  . Lung cancer Maternal Grandfather   . Kidney failure Paternal Grandmother   . Heart attack Paternal Grandfather   . Ovarian cancer Maternal Aunt        dx in her 2s  . Neurofibromatosis Maternal Aunt   . Ovarian cancer Maternal Aunt   . Neurofibromatosis Maternal Aunt   . Cervical cancer Maternal Aunt   . Neurofibromatosis Maternal Aunt   . Cancer Maternal Aunt        cancer on the bottom of her foot  . Breast cancer Other        MGF's sisters  . Neurofibromatosis Other        MGM's paternal aunt  The patient's mother was diagnosed with breast cancer at  age 27, but tells me the lump in her breast had been present for at least 10 years prior to that. She is now 25 and doing well. The patient's father died at the age of 35 from sepsis following liver transplantation. The patient has 2 brothers, no sisters. The patient tells me that she has at least 5 and sent cousins with breast cancer and there are other relatives with uterine cancer.   GYNECOLOGIC HISTORY:  Patient's last menstrual period was 01/10/2017. Menarche age 37, first live birth age 42, the patient is GX P1. She has had multiple progesterone and oral contraceptive treatments through [facility] because of her menometrorrhagia. 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 She will graduate from [employer] in 2022 with an associates degree in business.  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  Her husband Kelly Mendez is a truck Geophysicist/field seismologist. The patient's daughter Kelly Mendez graduated with honors from  [employer] and will plan to take a gap year at a veterinary school in the [place], completing organic chemistry and other courses she did not take in college, and then possibly proceeding to veterinary school there. Kelly Mendez has 3 children, all boys, kim is a cook in [place] and lives independently. The 2 younger boys, Kelly Mendez and Kelly Mendez, aged 21 and 25 are at home with the patient.     ADVANCED DIRECTIVES: In the absence of any documents to the contrary the patient's husband is her healthcare power of attorney   HEALTH MAINTENANCE: Social History   Tobacco Use  . Smoking status: Former Smoker    Packs/day: 0.15    Years: 25.00    Pack years: 3.75    Types: Cigarettes    Quit date: 11/25/2016    Years since quitting: 3.7  . Smokeless tobacco: Never Used  Vaping Use  . Vaping Use: Never used  Substance Use Topics  . Alcohol use: Yes    Alcohol/week: 1.0 standard drink    Types: 1 Glasses of wine per week    Comment: rarely   . Drug use: No    Colonoscopy: n/a  PAP:  Bone density:   Allergies  Allergen Reactions  . Lisinopril     Cough, itchy throat  . Penicillins Other (See Comments)    As a child Has patient had a PCN reaction causing immediate rash, facial/tongue/throat swelling, SOB or lightheadedness with hypotension: unknown Has patient had a PCN reaction causing severe rash involving mucus membranes or skin necrosis: unknown Has patient had a PCN reaction that required hospitalization unknown Has patient had a PCN reaction occurring within the last 10 years: no If all of the above answers are "NO", then may proceed with Cephalosporin use.     Current Outpatient Medications  Medication Sig Dispense Refill  . albuterol (VENTOLIN HFA) 108 (90 Base) MCG/ACT inhaler Inhale 2 puffs into the lungs every 6 (six) hours as needed for wheezing or shortness of breath. 8 g 0  . atomoxetine (STRATTERA) 40 MG capsule Take 1 capsule (40 mg total) by mouth daily. 30 capsule 2  .  atorvastatin (LIPITOR) 20 MG tablet Take 1 tablet (20 mg total) by mouth daily. 90 tablet 0  . Continuous Blood Gluc Sensor (DEXCOM G6 SENSOR) MISC 1 Device by Does not apply route as directed.  9 each 3  . diclofenac (CATAFLAM) 50 MG tablet Take 1 tablet (50 mg total) by mouth 3 (three) times daily. 90 tablet 3  . diphenhydramine-acetaminophen (TYLENOL PM) 25-500 MG TABS tablet Take 1 tablet by mouth at bedtime as needed.    . Dulaglutide (TRULICITY) 1.5 HB/7.1IR SOPN Inject 1.5 mg into the skin once a week. 12 mL 1  . DULoxetine (CYMBALTA) 60 MG capsule TAKE 1 CAPSULE (60 MG TOTAL) BY MOUTH DAILY. START 1 CAPSULE DAILY IN 14 DAYS WHEN EFFEXOR IS AT 1 TAB DAILY 30 capsule 2  . gabapentin (NEURONTIN) 100 MG capsule Take 2 capsules (200 mg total) by mouth 3 (three) times daily. 180 capsule 2  . hydrOXYzine (VISTARIL) 25 MG capsule TAKE 1 CAPSULE (25 MG TOTAL) BY MOUTH 3 (THREE) TIMES DAILY AS NEEDED. 30 capsule 0  . losartan (COZAAR) 50 MG tablet Take 1 tablet (50 mg total) by mouth daily. 90 tablet 0  . metFORMIN (GLUCOPHAGE-XR) 500 MG 24 hr tablet Take 2 tablets (1,000 mg total) by mouth 2 (two) times daily with a meal. 360 tablet 3  . traZODone (DESYREL) 50 MG tablet Take 1 tablet (50 mg total) by mouth at bedtime. (Patient taking differently: Take 50 mg by mouth at bedtime as needed for sleep.) 30 tablet 2  . Vitamin D, Ergocalciferol, (DRISDOL) 1.25 MG (50000 UNIT) CAPS capsule Take 1 capsule (50,000 Units total) by mouth every 7 (seven) days. 4 capsule 0   No current facility-administered medications for this visit.    OBJECTIVE: African-American woman who appears stated age  45:   09/03/20 1316  BP: 124/88  Pulse: (!) 101  Resp: 20  Temp: (!) 97.3 F (36.3 C)  SpO2: 99%     Body mass index is 45.46 kg/m.    ECOG FS:1 - Symptomatic but completely ambulatory  Sclerae unicteric, EOMs intact Wearing a mask No cervical or supraclavicular adenopathy Lungs no rales or rhonchi Heart  regular rate and rhythm Abd soft, nontender, positive bowel sounds MSK no focal spinal tenderness, grade 1 right upper extremity lymphedema Neuro: nonfocal, well oriented, appropriate affect Breasts: The right breast is status post lumpectomy and radiation.  There are the expected skin changes but no evidence of local recurrence.  The left breast is benign.  Both axillae are benign.   LAB RESULTS:  CMP     Component Value Date/Time   NA 139 08/01/2020 1336   NA 140 05/07/2017 1221   K 4.5 08/01/2020 1336   K 4.0 05/07/2017 1221   CL 101 08/01/2020 1336   CO2 22 08/01/2020 1336   CO2 24 05/07/2017 1221   GLUCOSE 127 (H) 08/01/2020 1336   GLUCOSE 122 (H) 05/01/2020 1331   GLUCOSE 173 (H) 05/07/2017 1221   BUN 14 08/01/2020 1336   BUN 6.7 (L) 05/07/2017 1221   CREATININE 0.86 08/01/2020 1336   CREATININE 0.8 05/07/2017 1221   CALCIUM 10.0 08/01/2020 1336   CALCIUM 9.7 05/07/2017 1221   PROT 7.6 08/01/2020 1336   PROT 6.8 05/07/2017 1221   ALBUMIN 4.8 08/01/2020 1336   ALBUMIN 3.4 (L) 05/07/2017 1221   AST 25 08/01/2020 1336   AST 21 05/07/2017 1221   ALT 34 (H) 08/01/2020 1336   ALT 24 05/07/2017 1221   ALKPHOS 141 (H) 08/01/2020 1336   ALKPHOS 131 05/07/2017 1221   BILITOT <0.2 08/01/2020 1336   BILITOT 0.48 05/07/2017 1221   GFRNONAA 82 08/01/2020 1336   GFRNONAA >60 05/01/2020 1331   GFRAA 95  08/01/2020 1336    No results found for: Ronnald Ramp, A1GS, A2GS, BETS, BETA2SER, GAMS, MSPIKE, SPEI  No results found for: Nils Pyle, [facility]  Lab Results  Component Value Date   WBC 6.2 09/03/2020   NEUTROABS 4.2 09/03/2020   HGB 13.0 09/03/2020   HCT 41.5 09/03/2020   MCV 84.0 09/03/2020   PLT 244 09/03/2020      Chemistry      Component Value Date/Time   NA 139 08/01/2020 1336   NA 140 05/07/2017 1221   K 4.5 08/01/2020 1336   K 4.0 05/07/2017 1221   CL 101 08/01/2020 1336   CO2 22 08/01/2020 1336   CO2 24 05/07/2017 1221    BUN 14 08/01/2020 1336   BUN 6.7 (L) 05/07/2017 1221   CREATININE 0.86 08/01/2020 1336   CREATININE 0.8 05/07/2017 1221      Component Value Date/Time   CALCIUM 10.0 08/01/2020 1336   CALCIUM 9.7 05/07/2017 1221   ALKPHOS 141 (H) 08/01/2020 1336   ALKPHOS 131 05/07/2017 1221   AST 25 08/01/2020 1336   AST 21 05/07/2017 1221   ALT 34 (H) 08/01/2020 1336   ALT 24 05/07/2017 1221   BILITOT <0.2 08/01/2020 1336   BILITOT 0.48 05/07/2017 1221       No results found for: LABCA2  No components found for: [id number]  No results for input(s): INR in the last 168 hours.  Urinalysis    Component Value Date/Time   COLORURINE YELLOW 05/01/2020 1331   APPEARANCEUR HAZY (A) 05/01/2020 1331   LABSPEC 1.021 05/01/2020 1331   LABSPEC 1.015 01/12/2018 1100   PHURINE 5.0 05/01/2020 1331   GLUCOSEU 50 (A) 05/01/2020 1331   HGBUR [place] (A) 05/01/2020 1331   BILIRUBINUR NEGATIVE 05/01/2020 1331   BILIRUBINUR negative 01/12/2018 1100   BILIRUBINUR n 09/29/2016 1045   KETONESUR NEGATIVE 05/01/2020 1331   PROTEINUR 30 (A) 05/01/2020 1331   UROBILINOGEN negative 09/29/2016 1045   NITRITE NEGATIVE 05/01/2020 1331   LEUKOCYTESUR NEGATIVE 05/01/2020 1331    STUDIES: No results found.   ELIGIBLE FOR AVAILABLE RESEARCH PROTOCOL: not a candidate for PREVENT   ASSESSMENT: 45 y.o.  [place] woman status post right breast upper inner quadrant biopsy 10/16/2016 for a clinical T1b pN0, stage IB invasive ductal carcinoma, grade 3, triple negative, with an MIB-1 of 80%.  (1)  genetics testing 12/12/2016 through the  Multi-Gene Panel offered by [facility] found no deleterious mutations in ALK, APC, ATM, AXIN2,BAP1,  BARD1, BLM, BMPR1A, BRCA1, BRCA2, BRIP1, CASR, CDC73, CDH1, CDK4, CDKN1B, CDKN1C, CDKN2A (p14ARF), CDKN2A (p16INK4a), CEBPA, CHEK2, CTNNA1, DICER1, DIS3L2, EGFR (c.2369C>T, p.Thr790Met variant only), EPCAM (Deletion/duplication testing only), FH, FLCN, GATA2, GPC3, GREM1 (Promoter region  deletion/duplication testing only), HOXB13 (c.251G>A, p.Gly84Glu), HRAS, KIT, MAX, MEN1, MET, MITF (c.952G>A, p.Glu318Lys variant only), MLH1, MSH2, MSH3, MSH6, MUTYH, NBN, NF1, NF2, NTHL1, PALB2, PDGFRA, PHOX2B, PMS2, POLD1, POLE, POT1, PRKAR1A, PTCH1, PTEN, RAD50, RAD51C, RAD51D, RB1, RECQL4, RET, RUNX1, SDHAF2, SDHA (sequence changes only), SDHB, SDHC, SDHD, SMAD4, SMARCA4, SMARCB1, SMARCE1, STK11, SUFU, TERT, TERT, TMEM127, TP53, TSC1, TSC2, VHL, WRN and WT1  (a) VUSs noted in BLM c.2340G>A (silent), BRIP1 c.2594G>A (p.Arg865Gln) and MUTYH c.562A>G (p.Glu188Lys)   (2) neoadjuvant chemotherapy consisting of doxorubicin and cyclophosphamide in dose dense fashion  starting 11/05/2016 to be followed by Abraxane weekly  (a), received third cycle 12/03/2016 then proceeded to weekly Abraxane starting 01/01/2017  (b) Abraxane given from 01/01/17-01/22/17 (4 cycles), stopped early due to peripheral neuropathy  (c) cycle 4 of cyclophosphamide and  doxorubicin given at end of Abraxane on 02/12/2017  (3) status post right lumpectomy and sentinel lymph node sampling 03/18/2017 for a ypT1b ypN0 invasive ductal carcinoma, grade 3, with negative margins, and repeat prognostic panel again triple negative  (4) adjuvant radiation completed 05/20/2017 Site/dose:  The right breast was treated to 40.05 Gy in 15 fractions, followed by a 10 Gy boost in 5 fractions to yield a total dose of 50.05 Gy  (5) extensive right upper extremity deep venous thrombosis documented 11/23/2016, treated initially with Lovenox  (a) transitioned to rivaroxaban as of 11/30/2016  (b) total 6 months anticoagulation planned (one month beyond port removal)  (c) d-dimer 08/11/2017 normal  (d) rivaroxaban discontinued late January 2019 for financial reasons  (e) bilateral upper extremity duplex ultrasound 10/14/2018 showed no evidence of residual DVT or superficial vein thrombosis in either upper extremity  (6) tobacco abuse: The patient quit  smoking 11/26/2016  (7) menometrorrhagia, with a cystic right adnexal lesion and a possible cervical polyp noted on ultrasound 12/16/2016, with benign endocervical curettage and endometrial biopsy 12/16/2016  (a) CA 125 12/17/2016 was 14.2 (normal).  (b) adnexal mass noted, likely benign, to be explored at the completion of chemotherapy  (c) goserelin started 12/16/2016, continued every 28 days, last dose 06/05/2017  (d) status post hysterectomy and bilateral salpingo-oophorectomy 06/16/2017 with benign pathology  (8) iron deficiency anemia secondary to menometrorrhagia, status post Feraheme 05/20/2017 and 06/01/2017  (9) likely thalassemia trait  (a) on 08/11/2017 hemoglobin was 12.7, MCV 78, and ferritin 188  (10) post traumatic stress disorder  (a) venlafaxine increased to 150 mg daily as of 02/25/2018    PLAN: Kelly Mendez is now 3-1/2 years out from definitive surgery for her breast cancer with no evidence of disease recurrence.  This is very favorable.  I am referring her to physical therapy so they can do a little better review on lymphedema management and I also wrote her a prescription for a right upper extremity sleeve.  She will have her next mammography in May.  I am going to see her again in October.  The plan is to continue follow-up for a total of 5 years  She knows to call for any other issue that may develop before the next visit  Total encounter time 20 minutes.Sarajane Jews C. Magrinat, MD  09/03/20 1:19 PM Medical Oncology and Hematology [facility] [place], [place] [address] Tel. [phone]    Fax. [phone]   I, Wilburn Mylar, am acting as scribe for Dr. Virgie Dad. Magrinat.  I, Lurline Del MD, have reviewed the above documentation for accuracy and completeness, and I agree with the above.   *Total Encounter Time as defined by the Centers for Medicare and Medicaid Services includes, in addition to the face-to-face time of  a patient visit (documented in the note above) non-face-to-face time: obtaining and reviewing outside history, ordering and reviewing medications, tests or procedures, care coordination (communications with other health care professionals or caregivers) and documentation in the medical record.

## 2020-09-05 ENCOUNTER — Telehealth (INDEPENDENT_AMBULATORY_CARE_PROVIDER_SITE_OTHER): Payer: 59 | Admitting: Family Medicine

## 2020-09-05 ENCOUNTER — Encounter: Payer: Self-pay | Admitting: Internal Medicine

## 2020-09-05 ENCOUNTER — Encounter (INDEPENDENT_AMBULATORY_CARE_PROVIDER_SITE_OTHER): Payer: Self-pay | Admitting: Family Medicine

## 2020-09-05 ENCOUNTER — Other Ambulatory Visit: Payer: Self-pay

## 2020-09-05 ENCOUNTER — Ambulatory Visit: Payer: 59 | Attending: Oncology | Admitting: Physical Therapy

## 2020-09-05 ENCOUNTER — Telehealth: Payer: Self-pay | Admitting: Oncology

## 2020-09-05 DIAGNOSIS — E1165 Type 2 diabetes mellitus with hyperglycemia: Secondary | ICD-10-CM | POA: Diagnosis not present

## 2020-09-05 DIAGNOSIS — Z794 Long term (current) use of insulin: Secondary | ICD-10-CM | POA: Diagnosis not present

## 2020-09-05 DIAGNOSIS — E559 Vitamin D deficiency, unspecified: Secondary | ICD-10-CM | POA: Diagnosis not present

## 2020-09-05 DIAGNOSIS — Z6841 Body Mass Index (BMI) 40.0 and over, adult: Secondary | ICD-10-CM

## 2020-09-05 MED ORDER — DEXCOM G6 SENSOR MISC: 1.0000 | 3 refills | Status: DC

## 2020-09-05 MED ORDER — DEXCOM G6 TRANSMITTER MISC: 2 refills | Status: DC

## 2020-09-05 MED ORDER — TRULICITY 3 MG/0.5ML ~~LOC~~ SOAJ: 3.0000 mg | SUBCUTANEOUS | 0 refills | Status: DC

## 2020-09-05 NOTE — Telephone Encounter (Signed)
Scheduled appts per 2/21 los. Left voicemail with appt date/time.

## 2020-09-06 NOTE — Progress Notes (Signed)
TeleHealth Visit:  Due to the COVID-19 pandemic, this visit was completed with telemedicine (audio/video) technology to reduce patient and provider exposure as well as to preserve personal protective equipment.   Kelly Mendez has verbally consented to this TeleHealth visit. The patient is located at home, the provider is located at the [facility] office. The participants in this visit include the listed provider and patient. The visit was conducted today via video.   Chief Complaint: OBESITY Kelly Mendez is here to discuss her progress with her obesity treatment plan along with follow-up of her obesity related diagnoses. Kelly Mendez is on the Category 4 Plan and states she is following her eating plan approximately 80-90% of the time. Kelly Mendez states she is doing water aerobics and walking 45 minutes 2-6 times per week.  Today's visit was #: 4 Starting weight: 331 lbs Starting date: 07/31/2020  Interim History: Anja exceeded her budget for [employer] and couldn't come into the office today. Her pants are loser in the waist and pt is sticking to the meal plan more consistently. She is eating food broken into 4 meals. She hasn't gotten new dexcom yet. She [place] planning to continue water aerobics class.  Subjective:   1. Type 2 diabetes mellitus with hyperglycemia, with long-term current use of insulin (HCC) Kelly Mendez is on Trulicity 1.5 mg. She has no feelings of hypoglycemia or hyperglycemia. Pt reports she hasn't gotten her new dexcom yet.  2. Vitamin D deficiency Pt's last Vit D was 25.3. she just finished her prescription from 5 weeks ago.  Assessment/Plan:   1. Type 2 diabetes mellitus with hyperglycemia, with long-term current use of insulin (HCC) Good blood sugar control is important to decrease the likelihood of diabetic complications such as nephropathy, neuropathy, limb loss, blindness, coronary artery disease, and death. Intensive lifestyle modification including diet, exercise  and weight loss are the first line of treatment for diabetes. Increase Trulicity to 3.0 mg, as per below. Follow up with blood sugar at next appointment.  - Dulaglutide (TRULICITY) 3 ZJ/6.9CV SOPN; Inject 3 mg as directed once a week.  Dispense: 2 mL; Refill: 0  2. Vitamin D deficiency Low Vitamin D level contributes to fatigue and are associated with obesity, breast, and colon cancer. She agrees repeat Vit D level at next appointment.  3. Class 3 severe obesity with serious comorbidity and body mass index (BMI) of 40.0 to 44.9 in adult, unspecified obesity type (HCC) Kelly Mendez is currently in the action stage of change. As such, her goal is to continue with weight loss efforts. She has agreed to the Category 4 Plan.   Exercise goals: All adults should avoid inactivity. Some physical activity is better than none, and adults who participate in any amount of physical activity gain some health benefits.  Behavioral modification strategies: increasing lean protein intake, meal planning and cooking strategies, keeping healthy foods in the home and planning for success.  Kelly Mendez has agreed to follow-up with our clinic in 2 weeks. She was informed of the importance of frequent follow-up visits to maximize her success with intensive lifestyle modifications for her multiple health conditions.  Objective:   VITALS: Per patient if applicable, see vitals. GENERAL: Alert and in no acute distress. CARDIOPULMONARY: No increased WOB. Speaking in clear sentences.  PSYCH: Pleasant and cooperative. Speech normal rate and rhythm. Affect is appropriate. Insight and judgement are appropriate. Attention is focused, linear, and appropriate.  NEURO: Oriented as arrived to appointment on time with no prompting.  Lab Results  Component Value Date   CREATININE 0.82 09/03/2020   BUN 13 09/03/2020   NA 138 09/03/2020   K 4.3 09/03/2020   CL 103 09/03/2020   CO2 24 09/03/2020   Lab Results  Component Value Date    ALT 28 09/03/2020   AST 16 09/03/2020   ALKPHOS 117 09/03/2020   BILITOT 0.4 09/03/2020   Lab Results  Component Value Date   HGBA1C 7.0 (H) 05/01/2020   HGBA1C 5.9 (A) 12/19/2019   HGBA1C 7.5 (A) 08/19/2019   HGBA1C 7.0 (A) 08/13/2018   HGBA1C 6.7 (A) 01/12/2018   Lab Results  Component Value Date   INSULIN 46.1 (H) 08/01/2020   Lab Results  Component Value Date   TSH 3.480 04/27/2020   Lab Results  Component Value Date   CHOL 109 04/27/2020   HDL 30 (L) 04/27/2020   LDLCALC 62 04/27/2020   TRIG 85 04/27/2020   CHOLHDL 3.6 04/27/2020   Lab Results  Component Value Date   WBC 6.2 09/03/2020   HGB 13.0 09/03/2020   HCT 41.5 09/03/2020   MCV 84.0 09/03/2020   PLT 244 09/03/2020   Lab Results  Component Value Date   FERRITIN 188 08/11/2017    Attestation Statements:   Reviewed by clinician on day of visit: allergies, medications, problem list, medical history, surgical history, family history, social history, and previous encounter notes.  Coral Ceo, am acting as transcriptionist for Coralie Common, MD.   I have reviewed the above documentation for accuracy and completeness, and I agree with the above. - Jinny Blossom, MD

## 2020-09-07 ENCOUNTER — Telehealth (HOSPITAL_COMMUNITY): Payer: Self-pay

## 2020-09-07 NOTE — Telephone Encounter (Signed)
Patient called and stated that she would like her Atomoxetine 40mg  and her Duloxetine 60mg  resent to [facility] on [address]/[place]. I transferred her to the fd to make an appointment with you. Please review and advise. Thank you

## 2020-09-10 ENCOUNTER — Other Ambulatory Visit (HOSPITAL_COMMUNITY): Payer: Self-pay | Admitting: Psychiatry

## 2020-09-10 ENCOUNTER — Other Ambulatory Visit: Payer: Self-pay | Admitting: Oncology

## 2020-09-10 ENCOUNTER — Encounter: Payer: Self-pay | Admitting: Oncology

## 2020-09-10 DIAGNOSIS — F411 Generalized anxiety disorder: Secondary | ICD-10-CM

## 2020-09-10 DIAGNOSIS — F331 Major depressive disorder, recurrent, moderate: Secondary | ICD-10-CM

## 2020-09-10 DIAGNOSIS — Z9889 Other specified postprocedural states: Secondary | ICD-10-CM

## 2020-09-10 DIAGNOSIS — F900 Attention-deficit hyperactivity disorder, predominantly inattentive type: Secondary | ICD-10-CM

## 2020-09-10 MED ORDER — ATOMOXETINE HCL 40 MG PO CAPS: 40.0000 mg | ORAL_CAPSULE | Freq: Every day | ORAL | 2 refills | Status: DC

## 2020-09-10 MED ORDER — DULOXETINE HCL 60 MG PO CPEP: ORAL_CAPSULE | ORAL | 2 refills | Status: DC

## 2020-09-10 NOTE — Telephone Encounter (Signed)
Medications transferred to [facility] on [address].

## 2020-09-12 ENCOUNTER — Telehealth (HOSPITAL_COMMUNITY): Payer: Self-pay | Admitting: Licensed Clinical Social Worker

## 2020-09-12 ENCOUNTER — Other Ambulatory Visit: Payer: Self-pay

## 2020-09-12 ENCOUNTER — Ambulatory Visit (HOSPITAL_COMMUNITY): Payer: 59 | Admitting: Licensed Clinical Social Worker

## 2020-09-12 DIAGNOSIS — C50211 Malignant neoplasm of upper-inner quadrant of right female breast: Secondary | ICD-10-CM

## 2020-09-12 DIAGNOSIS — Z171 Estrogen receptor negative status [ER-]: Secondary | ICD-10-CM

## 2020-09-12 NOTE — Telephone Encounter (Signed)
LCSW sent text link for video session per schedule. When pt failed to sign on LCSW called pt. Call went immediately to a very fast busy signal with no ability to leave a message.

## 2020-09-17 NOTE — Telephone Encounter (Signed)
Had notified patient - LVM

## 2020-09-20 ENCOUNTER — Other Ambulatory Visit: Payer: Self-pay

## 2020-09-20 ENCOUNTER — Encounter (INDEPENDENT_AMBULATORY_CARE_PROVIDER_SITE_OTHER): Payer: Self-pay | Admitting: Family Medicine

## 2020-09-20 ENCOUNTER — Ambulatory Visit (INDEPENDENT_AMBULATORY_CARE_PROVIDER_SITE_OTHER): Payer: 59 | Admitting: Family Medicine

## 2020-09-20 VITALS — BP 138/83 | HR 105 | Temp 98.9°F | Ht 72.0 in | Wt 332.0 lb

## 2020-09-20 DIAGNOSIS — E1165 Type 2 diabetes mellitus with hyperglycemia: Secondary | ICD-10-CM

## 2020-09-20 DIAGNOSIS — Z9189 Other specified personal risk factors, not elsewhere classified: Secondary | ICD-10-CM

## 2020-09-20 DIAGNOSIS — Z794 Long term (current) use of insulin: Secondary | ICD-10-CM

## 2020-09-20 DIAGNOSIS — Z6841 Body Mass Index (BMI) 40.0 and over, adult: Secondary | ICD-10-CM

## 2020-09-20 DIAGNOSIS — E559 Vitamin D deficiency, unspecified: Secondary | ICD-10-CM | POA: Diagnosis not present

## 2020-09-20 MED ORDER — VITAMIN D (ERGOCALCIFEROL) 1.25 MG (50000 UNIT) PO CAPS: 50000.0000 [IU] | ORAL_CAPSULE | ORAL | 0 refills | Status: DC

## 2020-09-21 LAB — VITAMIN D 25 HYDROXY (VIT D DEFICIENCY, FRACTURES): Vit D, 25-Hydroxy: 40.8 ng/mL (ref 30.0–100.0)

## 2020-09-25 NOTE — Progress Notes (Signed)
Chief Complaint:   OBESITY Kelly Mendez is here to discuss her progress with her obesity treatment plan along with follow-up of her obesity related diagnoses. Kelly Mendez is on the Category 4 Plan and states she is following her eating plan approximately 60% of the time. Kelly Mendez states she is swimming and walking 30 minutes 2 times per week.  Today's visit was #: 5 Starting weight: 331 lbs Starting date: 07/31/2020 Today's weight: 332 lbs Today's date: 09/20/2020 Total lbs lost to date: 0 Total lbs lost since last in-office visit: 0  Interim History: Kelly Mendez had significant brownies cravings over the last few weeks. What was suppose to start as 1-2 brownies, ended up as the entire case. She also has had pancakes every Saturday. She is starting to go to walk or bike for 3 minutes. She is recommitting to getting back on track food wise.  Subjective:   1. Vitamin D deficiency Pt denies nausea, vomiting, and muscle weakness but notes fatigue. Pt is on prescription Vit D.  2. Type 2 diabetes mellitus with hyperglycemia, with long-term current use of insulin (HCC) Pt's fasting blood sugar 135-145. She is on Trulicity 3 mg. For the 1st week she had nausea but not this week. She hasn't gotten Dexcom yet due to new insurance. Still having to force herself to eat before 1-2 PM in the afternoon.  Lab Results  Component Value Date   HGBA1C 7.0 (H) 05/01/2020   HGBA1C 5.9 (A) 12/19/2019   HGBA1C 7.5 (A) 08/19/2019   Lab Results  Component Value Date   MICROALBUR 4.5 09/15/2016   LDLCALC 62 04/27/2020   CREATININE 0.82 09/03/2020   Lab Results  Component Value Date   INSULIN 46.1 (H) 08/01/2020    3. At risk for osteoporosis Kelly Mendez is at higher risk of osteopenia and osteoporosis due to Vitamin D deficiency.   Assessment/Plan:   1. Vitamin D deficiency Low Vitamin D level contributes to fatigue and are associated with obesity, breast, and colon cancer. She agrees to continue to take  prescription Vitamin D @50 ,000 IU every week and will follow-up for routine testing of Vitamin D, at least 2-3 times per year to avoid over-replacement. Check labs today.  - VITAMIN D 25 Hydroxy (Vit-D Deficiency, Fractures)  - Vitamin D, Ergocalciferol, (DRISDOL) 1.25 MG (50000 UNIT) CAPS capsule; Take 1 capsule (50,000 Units total) by mouth every 7 (seven) days.  Dispense: 4 capsule; Refill: 0  2. Type 2 diabetes mellitus with hyperglycemia, with long-term current use of insulin (HCC) Good blood sugar control is important to decrease the likelihood of diabetic complications such as nephropathy, neuropathy, limb loss, blindness, coronary artery disease, and death. Intensive lifestyle modification including diet, exercise and weight loss are the first line of treatment for diabetes. Continue Trulicity 3 mg, Glucophage, Lipitor, and ARB.  3. At risk for osteoporosis Kelly Mendez was given approximately 15 minutes of osteoporosis prevention counseling today. Kelly Mendez is at risk for osteopenia and osteoporosis due to her Vitamin D deficiency. She was encouraged to take her Vitamin D and follow her higher calcium diet and increase strengthening exercise to help strengthen her bones and decrease her risk of osteopenia and osteoporosis.  Repetitive spaced learning was employed today to elicit superior memory formation and behavioral change.  4. Class 3 severe obesity with serious comorbidity and body mass index (BMI) of 40.0 to 44.9 in adult, unspecified obesity type (HCC) Kelly Mendez is currently in the action stage of change. As such, her goal is to continue with  weight loss efforts. She has agreed to the Category 4 Plan.   Exercise goals: All adults should avoid inactivity. Some physical activity is better than none, and adults who participate in any amount of physical activity gain some health benefits.  Behavioral modification strategies: increasing lean protein intake, meal planning and cooking strategies,  keeping healthy foods in the home and dealing with family or coworker sabotage.  Kelly Mendez has agreed to follow-up with our clinic in 2 weeks. She was informed of the importance of frequent follow-up visits to maximize her success with intensive lifestyle modifications for her multiple health conditions.   Objective:   Blood pressure 138/83, pulse (!) 105, temperature 98.9 F (37.2 C), temperature source Oral, height 6' (1.829 m), weight (!) 332 lb (150.6 kg), last menstrual period 01/10/2017, SpO2 99 %. Body mass index is 45.03 kg/m.  General: Cooperative, alert, well developed, in no acute distress. HEENT: Conjunctivae and lids unremarkable. Cardiovascular: Regular rhythm.  Lungs: Normal work of breathing. Neurologic: No focal deficits.   Lab Results  Component Value Date   CREATININE 0.82 09/03/2020   BUN 13 09/03/2020   NA 138 09/03/2020   K 4.3 09/03/2020   CL 103 09/03/2020   CO2 24 09/03/2020   Lab Results  Component Value Date   ALT 28 09/03/2020   AST 16 09/03/2020   ALKPHOS 117 09/03/2020   BILITOT 0.4 09/03/2020   Lab Results  Component Value Date   HGBA1C 7.0 (H) 05/01/2020   HGBA1C 5.9 (A) 12/19/2019   HGBA1C 7.5 (A) 08/19/2019   HGBA1C 7.0 (A) 08/13/2018   HGBA1C 6.7 (A) 01/12/2018   Lab Results  Component Value Date   INSULIN 46.1 (H) 08/01/2020   Lab Results  Component Value Date   TSH 3.480 04/27/2020   Lab Results  Component Value Date   CHOL 109 04/27/2020   HDL 30 (L) 04/27/2020   LDLCALC 62 04/27/2020   TRIG 85 04/27/2020   CHOLHDL 3.6 04/27/2020   Lab Results  Component Value Date   WBC 6.2 09/03/2020   HGB 13.0 09/03/2020   HCT 41.5 09/03/2020   MCV 84.0 09/03/2020   PLT 244 09/03/2020   Lab Results  Component Value Date   FERRITIN 188 08/11/2017    Attestation Statements:   Reviewed by clinician on day of visit: allergies, medications, problem list, medical history, surgical history, family history, social history, and  previous encounter notes.  Coral Ceo, am acting as transcriptionist for Coralie Common, MD.   I have reviewed the above documentation for accuracy and completeness, and I agree with the above. - Jinny Blossom, MD

## 2020-10-02 ENCOUNTER — Other Ambulatory Visit (INDEPENDENT_AMBULATORY_CARE_PROVIDER_SITE_OTHER): Payer: Self-pay | Admitting: Family Medicine

## 2020-10-02 DIAGNOSIS — E1165 Type 2 diabetes mellitus with hyperglycemia: Secondary | ICD-10-CM

## 2020-10-02 DIAGNOSIS — Z794 Long term (current) use of insulin: Secondary | ICD-10-CM

## 2020-10-03 ENCOUNTER — Other Ambulatory Visit: Payer: Self-pay

## 2020-10-03 ENCOUNTER — Ambulatory Visit (INDEPENDENT_AMBULATORY_CARE_PROVIDER_SITE_OTHER): Payer: 59 | Admitting: Family Medicine

## 2020-10-03 ENCOUNTER — Encounter (INDEPENDENT_AMBULATORY_CARE_PROVIDER_SITE_OTHER): Payer: Self-pay | Admitting: Family Medicine

## 2020-10-03 VITALS — BP 114/84 | HR 103 | Temp 98.4°F | Ht 72.0 in | Wt 333.0 lb

## 2020-10-03 DIAGNOSIS — Z9189 Other specified personal risk factors, not elsewhere classified: Secondary | ICD-10-CM | POA: Diagnosis not present

## 2020-10-03 DIAGNOSIS — Z6841 Body Mass Index (BMI) 40.0 and over, adult: Secondary | ICD-10-CM

## 2020-10-03 DIAGNOSIS — Z794 Long term (current) use of insulin: Secondary | ICD-10-CM

## 2020-10-03 DIAGNOSIS — E559 Vitamin D deficiency, unspecified: Secondary | ICD-10-CM | POA: Diagnosis not present

## 2020-10-03 DIAGNOSIS — E1165 Type 2 diabetes mellitus with hyperglycemia: Secondary | ICD-10-CM

## 2020-10-03 MED ORDER — TRULICITY 3 MG/0.5ML ~~LOC~~ SOAJ
3.0000 mg | SUBCUTANEOUS | 0 refills | Status: DC
Start: 2020-10-03 — End: 2020-11-08

## 2020-10-03 MED ORDER — VITAMIN D (ERGOCALCIFEROL) 1.25 MG (50000 UNIT) PO CAPS
50000.0000 [IU] | ORAL_CAPSULE | ORAL | 0 refills | Status: DC
Start: 2020-10-03 — End: 2021-04-22

## 2020-10-04 NOTE — Progress Notes (Signed)
Chief Complaint:   OBESITY Kelly Mendez is here to discuss her progress with her obesity treatment plan along with follow-up of her obesity related diagnoses. [name] is on the Category 4 Plan and states she is following her eating plan approximately 75-80% of the time. [name] states she is doing water aerobics 30-45 minutes 2 times per week.  Today's visit was #: 6 Starting weight: 331 lbs Starting date: 07/31/2020 Today's weight: 333 lbs Today's date: 10/04/2020 Total lbs lost to date: 0 Total lbs lost since last in-office visit: 0  Interim History: [name] just got accepted into [employer]. She will be gone May through August so she will be unable to come in for appointments. She will be living alone and is excited about shopping and cooking for herself. Pt has been doing premier shake for breakfast and possibly adding bar to that. She is noticing her appetite is significantly decreased earlier in the week right after her medication.  Subjective:   1. Vitamin D deficiency Kelly Mendez's last Vit D level was 40.8. She denies nausea, vomiting, and muscle weakness but notes fatigue. Pt is on prescription Vit D.  2. Type 2 diabetes mellitus with hyperglycemia, with long-term current use of insulin (HCC) [name] is on Trulicity 3 mg. She is still experiencing some decrease in appetite with Trulicity.  3. At risk for osteoporosis [name] is at higher risk of osteopenia and osteoporosis due to Vitamin D deficiency.   Assessment/Plan:   1. Vitamin D deficiency Low Vitamin D level contributes to fatigue and are associated with obesity, breast, and colon cancer. She agrees to continue to take prescription Vitamin D @50 ,000 IU every week and will follow-up for routine testing of Vitamin D, at least 2-3 times per year to avoid over-replacement.  - Vitamin D, Ergocalciferol, (DRISDOL) 1.25 MG (50000 UNIT) CAPS capsule; Take 1 capsule (50,000 Units total) by mouth every 7 (seven) days.   Dispense: 4 capsule; Refill: 0  2. Type 2 diabetes mellitus with hyperglycemia, with long-term current use of insulin (HCC) Good blood sugar control is important to decrease the likelihood of diabetic complications such as nephropathy, neuropathy, limb loss, blindness, coronary artery disease, and death. Intensive lifestyle modification including diet, exercise and weight loss are the first line of treatment for diabetes.   - Dulaglutide (TRULICITY) 3 EH/2.0NO SOPN; Inject 3 mg as directed once a week.  Dispense: 2 mL; Refill: 0  3. At risk for osteoporosis Kelly Mendez was given approximately 15 minutes of osteoporosis prevention counseling today. Kelly Mendez is at risk for osteopenia and osteoporosis due to her Vitamin D deficiency. She was encouraged to take her Vitamin D and follow her higher calcium diet and increase strengthening exercise to help strengthen her bones and decrease her risk of osteopenia and osteoporosis.  Repetitive spaced learning was employed today to elicit superior memory formation and behavioral change.  4. Class 3 severe obesity with serious comorbidity and body mass index (BMI) of 45.0 to 49.9 in adult, unspecified obesity type (HCC) [name] is currently in the action stage of change. As such, her goal is to continue with weight loss efforts. She has agreed to the Category 4 Plan.   Exercise goals: All adults should avoid inactivity. Some physical activity is better than none, and adults who participate in any amount of physical activity gain some health benefits.  Behavioral modification strategies: increasing lean protein intake, meal planning and cooking strategies, keeping healthy foods in the home and planning for success.  Kelly Mendez  has agreed to follow-up with our clinic in 2-3 weeks. She was informed of the importance of frequent follow-up visits to maximize her success with intensive lifestyle modifications for her multiple health conditions.   Objective:   Blood  pressure 114/84, pulse (!) 103, temperature 98.4 F (36.9 C), temperature source Oral, height 6' (1.829 m), weight (!) 333 lb (151 kg), last menstrual period 01/10/2017, SpO2 97 %. Body mass index is 45.16 kg/m.  General: Cooperative, alert, well developed, in no acute distress. HEENT: Conjunctivae and lids unremarkable. Cardiovascular: Regular rhythm.  Lungs: Normal work of breathing. Neurologic: No focal deficits.   Lab Results  Component Value Date   CREATININE 0.82 09/03/2020   BUN 13 09/03/2020   NA 138 09/03/2020   K 4.3 09/03/2020   CL 103 09/03/2020   CO2 24 09/03/2020   Lab Results  Component Value Date   ALT 28 09/03/2020   AST 16 09/03/2020   ALKPHOS 117 09/03/2020   BILITOT 0.4 09/03/2020   Lab Results  Component Value Date   HGBA1C 7.0 (H) 05/01/2020   HGBA1C 5.9 (A) 12/19/2019   HGBA1C 7.5 (A) 08/19/2019   HGBA1C 7.0 (A) 08/13/2018   HGBA1C 6.7 (A) 01/12/2018   Lab Results  Component Value Date   INSULIN 46.1 (H) 08/01/2020   Lab Results  Component Value Date   TSH 3.480 04/27/2020   Lab Results  Component Value Date   CHOL 109 04/27/2020   HDL 30 (L) 04/27/2020   LDLCALC 62 04/27/2020   TRIG 85 04/27/2020   CHOLHDL 3.6 04/27/2020   Lab Results  Component Value Date   WBC 6.2 09/03/2020   HGB 13.0 09/03/2020   HCT 41.5 09/03/2020   MCV 84.0 09/03/2020   PLT 244 09/03/2020   Lab Results  Component Value Date   FERRITIN 188 08/11/2017    Attestation Statements:   Reviewed by clinician on day of visit: allergies, medications, problem list, medical history, surgical history, family history, social history, and previous encounter notes.  Coral Ceo, am acting as transcriptionist for Coralie Common, MD.   I have reviewed the above documentation for accuracy and completeness, and I agree with the above. - Jinny Blossom, MD

## 2020-10-10 ENCOUNTER — Telehealth (INDEPENDENT_AMBULATORY_CARE_PROVIDER_SITE_OTHER): Payer: 59 | Admitting: Psychiatry

## 2020-10-10 ENCOUNTER — Other Ambulatory Visit: Payer: Self-pay

## 2020-10-10 ENCOUNTER — Encounter (HOSPITAL_COMMUNITY): Payer: Self-pay | Admitting: Psychiatry

## 2020-10-10 DIAGNOSIS — F411 Generalized anxiety disorder: Secondary | ICD-10-CM

## 2020-10-10 DIAGNOSIS — F900 Attention-deficit hyperactivity disorder, predominantly inattentive type: Secondary | ICD-10-CM | POA: Diagnosis not present

## 2020-10-10 DIAGNOSIS — F331 Major depressive disorder, recurrent, moderate: Secondary | ICD-10-CM

## 2020-10-10 MED ORDER — ATOMOXETINE HCL 40 MG PO CAPS
40.0000 mg | ORAL_CAPSULE | Freq: Every day | ORAL | 2 refills | Status: DC
Start: 2020-10-10 — End: 2021-01-02

## 2020-10-10 MED ORDER — DULOXETINE HCL 60 MG PO CPEP: ORAL_CAPSULE | ORAL | 2 refills | Status: DC

## 2020-10-10 MED ORDER — HYDROXYZINE PAMOATE 25 MG PO CAPS: 25.0000 mg | ORAL_CAPSULE | Freq: Three times a day (TID) | ORAL | 2 refills | Status: DC | PRN

## 2020-10-10 MED ORDER — GABAPENTIN 100 MG PO CAPS: 200.0000 mg | ORAL_CAPSULE | Freq: Three times a day (TID) | ORAL | 2 refills | Status: DC

## 2020-10-10 NOTE — Progress Notes (Signed)
[facility] MD/PA/NP OP Progress Note Virtual Visit via Telephone Note  I connected with [facility] on 10/10/20 at  4:00 PM EDT by telephone and verified that I am speaking with the correct person using two identifiers.  Location: Patient: home Provider: Clinic   I discussed the limitations, risks, security and privacy concerns of performing an evaluation and management service by telephone and the availability of in person appointments. I also discussed with the patient that there may be a patient responsible charge related to this service. The patient expressed understanding and agreed to proceed.   I provided 30 minutes of non-face-to-face time during this encounter.   10/10/2020 2:55 PM Kelly Mendez  MRN:  [id number]  Chief Complaint: "I feel normal" HPI:  History of Present Illness:   45 y.o.female seen today for follow up psychiatric evaluation.  She has a psychiatric history of PTSD, ADHD,anxiety and depression and is currently being managed on Strattera 40 mg daily, Trazodone 25-50 mg as needed nightly, hydroxyzine 25 mg 3 times daily as needed, Cymbalta 60 mg daily, and gabapentin 200 mg 3 times daily (recives from PCP).  She notes that she discontinued trazodone because she felt foggy in the morning.  She notes her other medications are effective in managing her psychiatric conditions.  Today she was unable to logon virtually so her assessment was done over the phone.  During exam she was pleasant, cooperative, engaged in conversation.  She informed provider that she has occasional anxiety and depression however notes that she is able to cope with it.  She informed provider that she finally feels normal.  Today provider conducted a GAD-7 and patient scored a 10.  She notes that she worries about her grades (noting that she studies Conservation officer, nature at [employer]) and her 2 sons.  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  Provider also conducted a PHQ-9 and patient scored a 14.  Today she endorses adequate appetite.  She notes that her sleep fluctuates noting that at times she sleeps for 4 hours and at the time she sleeps 8 hours.  Overall she notes hydroxyzine has been effective in managing her sleep. Today she denies SI/HI/VAH or paranoia.  Patient notes that she is working on her health.  She notes that she goes to [facility] [employer] and reports that she exercises daily and has a meal plan.  She also notes that she does water aerobics to help manage any pain that she has.  She notes that when she exercises she has more energy.  Today no medication changes made.  Patient notes that she generally gets gabapentin for neuropathy.  She however informed provider that she is out of her medications and request a one-time fill as she also utilizes it for anxiety.  Provider informed patient that gabapentin would be filled at this time and future refills will be provided by her PCP. Patient agreeable to continue medication as prescribed.  No other concerns noted at this time.    Visit Diagnosis:    ICD-10-CM   1. Attention deficit hyperactivity disorder (ADHD), predominantly inattentive type  F90.0 atomoxetine (STRATTERA) 40 MG capsule  2. Generalized anxiety disorder  F41.1 hydrOXYzine (VISTARIL) 25 MG capsule    DULoxetine (CYMBALTA) 60 MG capsule    gabapentin (NEURONTIN) 100 MG capsule  3. Moderate episode of recurrent major depressive disorder (HCC)  F33.1 DULoxetine (CYMBALTA) 60  MG capsule    Past Psychiatric History: PTSD, ADHD,anxiety and depression   Past Medical History:  Past Medical History:  Diagnosis Date  . Abnormal Pap smear of cervix   . ADHD   . Anemia   . Anxiety   . Asthma   . Asymptomatic microscopic hematuria 09/29/2017  . Back pain   . Bilateral swelling of feet   . 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 Concussion    around age 71  . Depression   . Diabetes type 2, uncontrolled ([place])    new diagnosis in 08/2016  . DVT (deep venous thrombosis) ([place]) 11/24/2016   in rt arm  . Family history of breast cancer   . Family history of neurofibromatosis   . Family history of ovarian cancer   . History of radiation therapy 04/21/17- 05/20/17   Right Breast 40.05 Gy in 15 fractions followed by a 10 Gy boost in 5 fractions to yield a total dose of 50.05 Gy  . HPV in female   . Hyperlipemia   . Hypertension   . Malignant neoplasm of upper-inner quadrant of right breast in female, estrogen receptor negative ([place]) 10/21/2016  . Neuromuscular disorder (HCC)    neuropathy  . Pelvic mass in female 06/16/2017  . Personal history of chemotherapy 2018  . Personal history of radiation therapy 2018  . Pneumonia    2014   . PTSD (post-traumatic stress disorder)   . Sciatica   . Sleep apnea   . SOB (shortness of breath) on exertion   . Vitamin D deficiency     Past Surgical History:  Procedure Laterality Date  . BREAST LUMPECTOMY Right 03/18/2017  . BREAST LUMPECTOMY WITH RADIOACTIVE SEED AND SENTINEL LYMPH NODE BIOPSY Right 03/18/2017   Procedure: RIGHT BREAST LUMPECTOMY WITH RADIOACTIVE SEED AND RIGHT SENTINEL LYMPH NODE BIOPSY;  Surgeon: [name], MD;  Location: [facility];  Service: General;  Laterality: Right;  . COLPOSCOPY    . left ovary removed  1978   was told her ovary was removed but Dr. Denman George found that was not the case. Suspect this was originally an ovarian cystectomy  . LUMBAR LAMINECTOMY/DECOMPRESSION MICRODISCECTOMY N/A 05/04/2020   Procedure: BILATERAL LUMBAR FIVE THROUGH SACRAL ONE MICRODISCECTOMY;  Surgeon: Jessy Oto, MD;  Location: [facility];  Service: Orthopedics;  Laterality: N/A;  . PORTACATH PLACEMENT Right 10/29/2016   Procedure: INSERTION PORT-A-CATH WITH [place];  Surgeon: [name], MD;  Location: [facility];  Service: General;  Laterality: Right;  .  portacath removal     oct. 5 2018  . ROBOTIC ASSISTED TOTAL HYSTERECTOMY Bilateral 06/16/2017   Procedure: XI ROBOTIC ASSISTED TOTAL HYSTERECTOMY WITH BILATERAL SALPINGECTOMY,  OOPHERECTOMY;  Surgeon: [name], MD;  Location: [facility];  Service: Gynecology;  Laterality: Bilateral;    Family Psychiatric History: Mother Cocaine abuse and Father heroin abuse  Family History:  Family History  Problem Relation Age of Onset  . Breast cancer Mother 30  . Depression Mother   . Drug abuse Mother   . Hepatitis C Father   . Diabetes Father   . Hypertension Father   . Drug abuse Father   . Obesity Father   . Ovarian cancer Maternal Aunt        dx in her 40s  . Neurofibromatosis Maternal Uncle   . Brain cancer Maternal Uncle 58  . Lung cancer Maternal Grandfather   . Kidney failure Paternal  Grandmother   . Heart attack Paternal Grandfather   . Ovarian cancer Maternal Aunt        dx in her 62s  . Neurofibromatosis Maternal Aunt   . Ovarian cancer Maternal Aunt   . Neurofibromatosis Maternal Aunt   . Cervical cancer Maternal Aunt   . Neurofibromatosis Maternal Aunt   . Cancer Maternal Aunt        cancer on the bottom of her foot  . Breast cancer Other        MGF's sisters  . Neurofibromatosis Other        MGM's paternal aunt    Social History:  Social History   Socioeconomic History  . Marital status: Married    Spouse name: Not on file  . Number of children: Not on file  . Years of education: Not on file  . Highest education level: Not on file  Occupational History  . Not on file  Tobacco Use  . Smoking status: Former Smoker    Packs/day: 0.15    Years: 25.00    Pack years: 3.75    Types: Cigarettes    Quit date: 11/25/2016    Years since quitting: 3.8  . Smokeless tobacco: Never Used  Vaping Use  . Vaping Use: Never used  Substance and Sexual Activity  . Alcohol use: Yes    Alcohol/week: 1.0 standard drink    Types: 1 Glasses of wine per week    Comment: rarely    . Drug use: No  . Sexual activity: Not Currently    Birth control/protection: Surgical    Comment: husband vasectomy  Other Topics Concern  . Not on file  Social History Narrative  . Not on file   Social Determinants of Health   Financial Resource Strain: Not on file  Food Insecurity: Not on file  Transportation Needs: Not on file  Physical Activity: Not on file  Stress: Not on file  Social Connections: Not on file    Allergies:  Allergies  Allergen Reactions  . Lisinopril     Cough, itchy throat  . Penicillins Other (See Comments)    As a child Has patient had a PCN reaction causing immediate rash, facial/tongue/throat swelling, SOB or lightheadedness with hypotension: unknown Has patient had a PCN reaction causing severe rash involving mucus membranes or skin necrosis: unknown Has patient had a PCN reaction that required hospitalization unknown Has patient had a PCN reaction occurring within the last 10 years: no If all of the above answers are "NO", then may proceed with Cephalosporin use.     Metabolic Disorder Labs: Lab Results  Component Value Date   HGBA1C 7.0 (H) 05/01/2020   MPG 154 05/01/2020   MPG 136.98 06/12/2017   No results found for: PROLACTIN Lab Results  Component Value Date   CHOL 109 04/27/2020   TRIG 85 04/27/2020   HDL 30 (L) 04/27/2020   CHOLHDL 3.6 04/27/2020   VLDL 13 09/29/2016   LDLCALC 62 04/27/2020   LDLCALC 40 10/24/2019   Lab Results  Component Value Date   TSH 3.480 04/27/2020   TSH 3.060 10/24/2019    Therapeutic Level Labs: No results found for: LITHIUM No results found for: VALPROATE No components found for:  CBMZ  Current Medications: Current Outpatient Medications  Medication Sig Dispense Refill  . albuterol (VENTOLIN HFA) 108 (90 Base) MCG/ACT inhaler Inhale 2 puffs into the lungs every 6 (six) hours as needed for wheezing or shortness of breath. 8 g 0  .  atomoxetine (STRATTERA) 40 MG capsule Take 1 capsule (40  mg total) by mouth daily. 30 capsule 2  . atorvastatin (LIPITOR) 20 MG tablet Take 1 tablet (20 mg total) by mouth daily. 90 tablet 0  . Continuous Blood Gluc Sensor (DEXCOM G6 SENSOR) MISC 1 Device by Does not apply route as directed. 9 each 3  . Continuous Blood Gluc Transmit (DEXCOM G6 TRANSMITTER) MISC Use as instructed to check blood sugar daily 1 each 2  . diclofenac (CATAFLAM) 50 MG tablet Take 1 tablet (50 mg total) by mouth 3 (three) times daily. 90 tablet 3  . Dulaglutide (TRULICITY) 3 WU/1.3KG SOPN Inject 3 mg as directed once a week. 2 mL 0  . DULoxetine (CYMBALTA) 60 MG capsule TAKE 1 CAPSULE (60 MG TOTAL) BY MOUTH DAILY. START 1 CAPSULE DAILY IN 14 DAYS WHEN EFFEXOR IS AT 1 TAB DAILY 30 capsule 2  . gabapentin (NEURONTIN) 100 MG capsule Take 2 capsules (200 mg total) by mouth 3 (three) times daily. 180 capsule 2  . hydrOXYzine (VISTARIL) 25 MG capsule Take 1 capsule (25 mg total) by mouth 3 (three) times daily as needed. 90 capsule 2  . losartan (COZAAR) 50 MG tablet Take 1 tablet (50 mg total) by mouth daily. 90 tablet 0  . metFORMIN (GLUCOPHAGE-XR) 500 MG 24 hr tablet Take 2 tablets (1,000 mg total) by mouth 2 (two) times daily with a meal. 360 tablet 3  . Vitamin D, Ergocalciferol, (DRISDOL) 1.25 MG (50000 UNIT) CAPS capsule Take 1 capsule (50,000 Units total) by mouth every 7 (seven) days. 4 capsule 0   No current facility-administered medications for this visit.     Musculoskeletal: Strength & Muscle Tone: Unable to assess due to telephone visit Gait & Station: Unable to assess due to telephone visit Patient leans: N/A  Psychiatric Specialty Exam: Review of Systems  Last menstrual period 01/10/2017.There is no height or weight on file to calculate BMI.  General Appearance: Unable to assess due to telephone visit  Eye Contact:  Unable to assess due to telephone visit  Speech:  Clear and Coherent and Normal Rate  Volume:  Normal  Mood:  Euthymic and Notes that she has  occasional anxiety and depression however reports she is able to cope with it  Affect:  Appropriate and Congruent  Thought Process:  Coherent, Goal Directed and Linear  Orientation:  Full (Time, Place, and Person)  Thought Content: WDL and Logical   Suicidal Thoughts:  No  Homicidal Thoughts:  No  Memory:  Immediate;   Good Recent;   Good Remote;   Good  Judgement:  Good  Insight:  Good  Psychomotor Activity:  Normal  Concentration:  Concentration: Good and Attention Span: Good  Recall:  Good  Fund of Knowledge: Good  Language: Good  Akathisia:  No  Handed:  Right  AIMS (if indicated): Not done  Assets:  Communication Skills Desire for Improvement Financial Resources/Insurance Housing Leisure Time Physical Health Social Support  ADL's:  Intact  Cognition: WNL  Sleep:  Fair   Screenings: GAD-7   Flowsheet Row Video Visit [facility] 10/10/2020 in [facility]  Total GAD-7 Score 10    PHQ2-9   Flowsheet Row Video Visit from 10/10/2020 in [facility] Office Visit from 07/25/2020 in [facility] from 03/08/2020 in [facility] Office Visit from 10/24/2019 in [facility] Visit from 01/12/2018 in [facility]  PHQ-2 Total Score 2  4 4 0 0  PHQ-9 Total Score 14 19 18  -- --       Assessment and Plan: Patient notes that she occasionally has symptoms of anxiety and depression however informed provider that she is able to cope with it.Today no medication changes made.  Patient notes that she generally gets gabapentin for neuropathy.  She however informed provider that she is out of her medications and request a one-time fill as she also utilizes it for anxiety.  Provider informed patient that gabapentin would be filled at this time and future refills will be provided by her PCP  1. Attention deficit hyperactivity disorder (ADHD), predominantly inattentive  type  Continue- atomoxetine (STRATTERA) 40 MG capsule; Take 1 capsule (40 mg total) by mouth daily.  Dispense: 30 capsule; Refill: 2  2. Generalized anxiety disorder  Continue- hydrOXYzine (VISTARIL) 25 MG capsule; Take 1 capsule (25 mg total) by mouth 3 (three) times daily as needed.  Dispense: 90 capsule; Refill: 2 Continue- DULoxetine (CYMBALTA) 60 MG capsule; TAKE 1 CAPSULE (60 MG TOTAL) BY MOUTH DAILY. START 1 CAPSULE DAILY IN 14 DAYS WHEN EFFEXOR IS AT 1 TAB DAILY  Dispense: 30 capsule; Refill: 2 Continue- gabapentin (NEURONTIN) 100 MG capsule; Take 2 capsules (200 mg total) by mouth 3 (three) times daily.  Dispense: 180 capsule; Refill: 2  3. Moderate episode of recurrent major depressive disorder (HCC)  Continue- DULoxetine (CYMBALTA) 60 MG capsule; TAKE 1 CAPSULE (60 MG TOTAL) BY MOUTH DAILY. START 1 CAPSULE DAILY IN 14 DAYS WHEN EFFEXOR IS AT 1 TAB DAILY  Dispense: 30 capsule; Refill: 2  Follow-up in 3 months Salley Slaughter, NP 10/10/2020, 2:55 PM

## 2020-10-22 ENCOUNTER — Encounter (INDEPENDENT_AMBULATORY_CARE_PROVIDER_SITE_OTHER): Payer: Self-pay | Admitting: Family Medicine

## 2020-10-22 ENCOUNTER — Encounter: Payer: Self-pay | Admitting: Internal Medicine

## 2020-10-22 ENCOUNTER — Ambulatory Visit (INDEPENDENT_AMBULATORY_CARE_PROVIDER_SITE_OTHER): Payer: 59 | Admitting: Internal Medicine

## 2020-10-22 ENCOUNTER — Ambulatory Visit (INDEPENDENT_AMBULATORY_CARE_PROVIDER_SITE_OTHER): Payer: 59 | Admitting: Family Medicine

## 2020-10-22 ENCOUNTER — Other Ambulatory Visit: Payer: Self-pay

## 2020-10-22 VITALS — BP 124/88 | HR 94 | Ht 72.0 in | Wt 333.0 lb

## 2020-10-22 VITALS — BP 141/90 | HR 95 | Temp 97.9°F | Ht 72.0 in | Wt 331.0 lb

## 2020-10-22 DIAGNOSIS — I152 Hypertension secondary to endocrine disorders: Secondary | ICD-10-CM

## 2020-10-22 DIAGNOSIS — E1165 Type 2 diabetes mellitus with hyperglycemia: Secondary | ICD-10-CM

## 2020-10-22 DIAGNOSIS — Z794 Long term (current) use of insulin: Secondary | ICD-10-CM

## 2020-10-22 DIAGNOSIS — E1142 Type 2 diabetes mellitus with diabetic polyneuropathy: Secondary | ICD-10-CM | POA: Diagnosis not present

## 2020-10-22 DIAGNOSIS — E1159 Type 2 diabetes mellitus with other circulatory complications: Secondary | ICD-10-CM

## 2020-10-22 DIAGNOSIS — Z6841 Body Mass Index (BMI) 40.0 and over, adult: Secondary | ICD-10-CM | POA: Diagnosis not present

## 2020-10-22 LAB — MICROALBUMIN / CREATININE URINE RATIO
Creatinine,U: 211.7 mg/dL
Microalb Creat Ratio: 2.1 mg/g (ref 0.0–30.0)
Microalb, Ur: 4.4 mg/dL — ABNORMAL HIGH (ref 0.0–1.9)

## 2020-10-22 LAB — POCT GLYCOSYLATED HEMOGLOBIN (HGB A1C): Hemoglobin A1C: 6.8 % — AB (ref 4.0–5.6)

## 2020-10-22 LAB — POCT GLUCOSE (DEVICE FOR HOME USE): POC Glucose: 148 mg/dl — AB (ref 70–99)

## 2020-10-22 NOTE — Patient Instructions (Signed)
-   Keep up the good work ! - Continue   Metformin 500 mg XR, 2 tablet with Breakfast and 2 tablet with supper  - Continue  trulicity 3 mg weekly     HOW TO TREAT LOW BLOOD SUGARS (Blood sugar LESS THAN 70 MG/DL)  Please follow the RULE OF 15 for the treatment of hypoglycemia treatment (when your (blood sugars are less than 70 mg/dL)    STEP 1: Take 15 grams of carbohydrates when your blood sugar is low, which includes:   3-4 GLUCOSE TABS  OR  3-4 OZ OF JUICE OR REGULAR SODA OR  ONE TUBE OF GLUCOSE GEL     STEP 2: RECHECK blood sugar in 15 MINUTES STEP 3: If your blood sugar is still low at the 15 minute recheck --> then, go back to STEP 1 and treat AGAIN with another 15 grams of carbohydrates.

## 2020-10-22 NOTE — Progress Notes (Signed)
Name: Kelly Mendez  Age/ Sex: 45 y.o., female   MRN/ DOB: [id number], [birth year]     PCP: Denita Lung, MD   Reason for Endocrinology Evaluation: Type 2 Diabetes Mellitus     Initial Endocrinology Clinic Visit: 09/03/2018    Kelly Mendez IDENTIFIER: Kelly Mendez is a 45 y.o. female with a past medical history of Right breast ca (03/19/2017 S/P lumpectomy, chemo and radiation) and T2DM. The Kelly Mendez has followed with Endocrinology clinic since 09/03/2018 for consultative assistance with management of her diabetes.  DIABETIC HISTORY:  Kelly Mendez was diagnosed with T2DM in 2018. She has been on metformin since diagnosis, trulicity added in 4742 , she has not been on insulin. Her hemoglobin A1c has ranged from 6.7% in 2019, peaking at 8.8% in 2018.   Trulicity increase to 3 mg by the [facility]    SUBJECTIVE:   During the last visit (06/25/2020): A1c 7.0 %. Continued  Metformin and  Trulicity    Today (5/95/6387): Kelly Mendez is here for a follow up on her diabetes management.  She has been checking her sugar 1 x/ day. She Kelly Mendez has not had hypoglycemic episodes since the last clinic visit . She will restart Dexcom until she meets her deductible  Continues to follow up with the [facility] healthy weight and wellness  Continue to be in remission from breast ca.   She is tired today, did not sleep well  She has minor GI symptoms following the Trulicity intake - tolerable at this time   [place]:   Metformin 500 mg 2 tabs  BID    Trulicity 3  mg weekly   Statin: yes ACE-I/ARB: no        DIABETIC COMPLICATIONS: Microvascular complications:   Neuropathy   Denies: CKD, retinopathy  Last eye exam: Completed 2021  Macrovascular complications:    Denies: CAD, PVD, CVA     PHYSICAL EXAM: VS: BP 124/88   Pulse 94   Ht 6' (1.829 m)   Wt (!) 333 lb (151 kg)   LMP 01/10/2017   SpO2 98%   BMI 45.16 kg/m    EXAM: General: Kelly Mendez  appears well and is in NAD  Lungs: Clear with good BS bilat with no rales, rhonchi, or wheezes  Abdomen: Normoactive bowel sounds, soft, nontender, without masses or organomegaly palpable  Extremities:  BL LE: no pretibial edema normal   Mental Status: Judgment, insight: intact Mood and affect: no depression, anxiety, or agitation   DM Foot exam 06/25/2020 The skin of the feet is without sores or ulcerations, right 1st MTH callous formation  The pedal pulses are 2+ on right and 2+ on left. The sensation is decreased to a screening 5.07, 10 gram monofilament on the right        HISTORY:  Past Medical History:  Past Medical History:  Diagnosis Date  . Abnormal Pap smear of cervix   . ADHD   . Anemia   . Anxiety   . Asthma   . Asymptomatic microscopic hematuria 09/29/2017  . Back pain   . Bilateral swelling of feet   . 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 Concussion    around age 37  . Depression   . Diabetes type 2, uncontrolled ([place])    new diagnosis in 08/2016  . DVT (deep venous thrombosis) ([place]) 11/24/2016   in rt arm  .  Family history of breast cancer   . Family history of neurofibromatosis   . Family history of ovarian cancer   . History of radiation therapy 04/21/17- 05/20/17   Right Breast 40.05 Gy in 15 fractions followed by a 10 Gy boost in 5 fractions to yield a total dose of 50.05 Gy  . HPV in female   . Hyperlipemia   . Hypertension   . Malignant neoplasm of upper-inner quadrant of right breast in female, estrogen receptor negative ([place]) 10/21/2016  . Neuromuscular disorder (HCC)    neuropathy  . Pelvic mass in female 06/16/2017  . Personal history of chemotherapy 2018  . Personal history of radiation therapy 2018  . Pneumonia    2014   . PTSD (post-traumatic stress disorder)   . Sciatica   . Sleep apnea   . SOB (shortness of breath) on exertion   . Vitamin D deficiency    Past Surgical History:  Past Surgical History:   Procedure Laterality Date  . BREAST LUMPECTOMY Right 03/18/2017  . BREAST LUMPECTOMY WITH RADIOACTIVE SEED AND SENTINEL LYMPH NODE BIOPSY Right 03/18/2017   Procedure: RIGHT BREAST LUMPECTOMY WITH RADIOACTIVE SEED AND RIGHT SENTINEL LYMPH NODE BIOPSY;  Surgeon: [name], MD;  Location: [facility];  Service: General;  Laterality: Right;  . COLPOSCOPY    . left ovary removed  1978   was told her ovary was removed but Dr. Denman George found that was not the case. Suspect this was originally an ovarian cystectomy  . LUMBAR LAMINECTOMY/DECOMPRESSION MICRODISCECTOMY N/A 05/04/2020   Procedure: BILATERAL LUMBAR FIVE THROUGH SACRAL ONE MICRODISCECTOMY;  Surgeon: Jessy Oto, MD;  Location: [facility];  Service: Orthopedics;  Laterality: N/A;  . PORTACATH PLACEMENT Right 10/29/2016   Procedure: INSERTION PORT-A-CATH WITH [place];  Surgeon: [name], MD;  Location: [facility];  Service: General;  Laterality: Right;  . portacath removal     oct. 5 2018  . ROBOTIC ASSISTED TOTAL HYSTERECTOMY Bilateral 06/16/2017   Procedure: XI ROBOTIC ASSISTED TOTAL HYSTERECTOMY WITH BILATERAL SALPINGECTOMY,  OOPHERECTOMY;  Surgeon: [name], MD;  Location: [facility];  Service: Gynecology;  Laterality: Bilateral;    Social History:  reports that she quit smoking about 3 years ago. Her smoking use included cigarettes. She has a 3.75 pack-year smoking history. She has never used smokeless tobacco. She reports current alcohol use of about 1.0 standard drink of alcohol per week. She reports that she does not use drugs. Family History:  Family History  Problem Relation Age of Onset  . Breast cancer Mother 73  . Depression Mother   . Drug abuse Mother   . Hepatitis C Father   . Diabetes Father   . Hypertension Father   . Drug abuse Father   . Obesity Father   . Ovarian cancer Maternal Aunt        dx in her 10s  . Neurofibromatosis Maternal Uncle   . Brain cancer Maternal Uncle 58  . Lung cancer Maternal  Grandfather   . Kidney failure Paternal Grandmother   . Heart attack Paternal Grandfather   . Ovarian cancer Maternal Aunt        dx in her 74s  . Neurofibromatosis Maternal Aunt   . Ovarian cancer Maternal Aunt   . Neurofibromatosis Maternal Aunt   . Cervical cancer Maternal Aunt   . Neurofibromatosis Maternal Aunt   . Cancer Maternal Aunt        cancer on the bottom of her foot  . Breast cancer  Other        MGF's sisters  . Neurofibromatosis Other        MGM's paternal aunt     HOME MEDICATIONS: Allergies as of 10/22/2020      Reactions   Lisinopril    Cough, itchy throat   Penicillins Other (See Comments)   As a child Has Kelly Mendez had a PCN reaction causing immediate rash, facial/tongue/throat swelling, SOB or lightheadedness with hypotension: unknown Has Kelly Mendez had a PCN reaction causing severe rash involving mucus membranes or skin necrosis: unknown Has Kelly Mendez had a PCN reaction that required hospitalization unknown Has Kelly Mendez had a PCN reaction occurring within the last 10 years: no If all of the above answers are "NO", then may proceed with Cephalosporin use.      Medication List       Accurate as of October 22, 2020  8:40 AM. If you have any questions, ask your nurse or doctor.        albuterol 108 (90 Base) MCG/ACT inhaler Commonly known as: VENTOLIN HFA Inhale 2 puffs into the lungs every 6 (six) hours as needed for wheezing or shortness of breath.   atomoxetine 40 MG capsule Commonly known as: Strattera Take 1 capsule (40 mg total) by mouth daily.   atorvastatin 20 MG tablet Commonly known as: LIPITOR Take 1 tablet (20 mg total) by mouth daily.   Dexcom G6 Sensor Misc 1 Device by Does not apply route as directed.   Dexcom G6 Transmitter Misc Use as instructed to check blood sugar daily   diclofenac 50 MG tablet Commonly known as: CATAFLAM Take 1 tablet (50 mg total) by mouth 3 (three) times daily.   DULoxetine 60 MG capsule Commonly known as:  CYMBALTA TAKE 1 CAPSULE (60 MG TOTAL) BY MOUTH DAILY. START 1 CAPSULE DAILY IN 14 DAYS WHEN EFFEXOR IS AT 1 TAB DAILY   gabapentin 100 MG capsule Commonly known as: Neurontin Take 2 capsules (200 mg total) by mouth 3 (three) times daily.   hydrOXYzine 25 MG capsule Commonly known as: VISTARIL Take 1 capsule (25 mg total) by mouth 3 (three) times daily as needed.   losartan 50 MG tablet Commonly known as: COZAAR Take 1 tablet (50 mg total) by mouth daily.   metFORMIN 500 MG 24 hr tablet Commonly known as: GLUCOPHAGE-XR Take 2 tablets (1,000 mg total) by mouth 2 (two) times daily with a meal.   Trulicity 3 KP/5.4SF Sopn Generic drug: Dulaglutide Inject 3 mg as directed once a week.   Vitamin D (Ergocalciferol) 1.25 MG (50000 UNIT) Caps capsule Commonly known as: DRISDOL Take 1 capsule (50,000 Units total) by mouth every 7 (seven) days.         DATA REVIEWED:  Lab Results  Component Value Date   HGBA1C 6.8 (A) 10/22/2020   HGBA1C 7.0 (H) 05/01/2020   HGBA1C 5.9 (A) 12/19/2019   Lab Results  Component Value Date   MICROALBUR 4.5 09/15/2016   LDLCALC 62 04/27/2020   CREATININE 0.82 09/03/2020   Lab Results  Component Value Date   MICRALBCREAT 21 08/13/2018     Lab Results  Component Value Date   CHOL 109 04/27/2020   HDL 30 (L) 04/27/2020   LDLCALC 62 04/27/2020   TRIG 85 04/27/2020   CHOLHDL 3.6 04/27/2020       Results for Bilbo, AVAREE GILBERTI (MRN [id number]) as of 10/22/2020 14:45  Ref. Range 10/22/2020 09:06  Creatinine,U Latest Units: mg/dL 211.7  Microalb, Ur Latest Ref Range: 0.0 -  1.9 mg/dL 4.4 (H)  MICROALB/CREAT RATIO Latest Ref Range: 0.0 - 30.0 mg/g 2.1    ASSESSMENT / PLAN / RECOMMENDATIONS:   1) Type 2 Diabetes Mellitus, Optimally controlled, With neuropathic complications - Most recent A1c of 6.8 %. Goal A1c <7.0 %.  - Praised Kelly Mendez on improved glycemic control  - No changes today  - Awaiting on meeting deductible prior to obtaining dexcom    MEDICATIONS:  Continue Metformin 500 mg, 2 tabs BID   Continue Trulicity 3 mg weekly  EDUCATION / INSTRUCTIONS:  BG monitoring instructions: Kelly Mendez is instructed to check her blood sugars 2 times a day, fasting and bedtime.  Call [facility] if: BG persistently < 70  . I reviewed the Rule of 15 for the treatment of hypoglycemia in detail with the Kelly Mendez. Literature supplied.     2) Diabetic complications:   Eye: Does not have known diabetic retinopathy.  Kelly Mendez encouraged to have an eye exam ASAP  Neuro/ Feet: Does have known diabetic peripheral neuropathy .   Renal: Kelly Mendez does not have known baseline CKD. She   is not on an ACEI/ARB at present.    F/U in 6 months    Signed electronically by: Mack Guise, MD  [facility]  [facility] [address] [place], [place] [address] Phone: [phone] FAX: [phone]   CC: Denita Lung, [name] [address] Phone: [phone]  Fax: [phone]  Return to Endocrinology clinic as below: Future Appointments  Date Time Provider [place]  10/22/2020 12:20 PM Laqueta Linden, MD [facility] None  10/25/2020  9:30 AM [name], NP-C [facility] [place]  11/21/2020  9:15 AM Jessy Oto, MD [facility] None  11/26/2020  3:40 PM [facility] DIAG TOMO 1 [facility]-BCGMM GI-BREAST CE  01/02/2021  2:30 PM Eulis Canner E, NP [facility] None  03/14/2021  2:00 PM GI-[facility] DX DEXA 1 GI-BCGDG GI-BREAST CE  04/29/2021  2:30 PM [facility] LAB [facility] None  04/29/2021  3:00 PM Magrinat, Virgie Dad, MD [facility] None

## 2020-10-25 ENCOUNTER — Encounter: Payer: 59 | Admitting: Family Medicine

## 2020-10-25 NOTE — Progress Notes (Signed)
Chief Complaint:   OBESITY Kelly Mendez is here to discuss her progress with her obesity treatment plan along with follow-up of her obesity related diagnoses. Kelly Mendez is on the Category 4 Plan and states she is following her eating plan approximately 50% of the time. Kelly Mendez states she is doing 3,000 steps 3 times per week.  Today's visit was #: 7 Starting weight: 331 lbs Starting date: 07/31/2020 Today's weight: 331 lbs Today's date: 10/22/2020 Total lbs lost to date: 0 Total lbs lost since last in-office visit: 2  Interim History: Kelly Mendez had a difficult few weeks- tried to quit smoking but had reaction to nicotine patch. Also, her son just came out as trans and she's been dealing with emotions of informing the family. She has still found herself going periods without eating.  Subjective:   1. Type 2 diabetes mellitus with hyperglycemia, with long-term current use of insulin (HCC) Kelly Mendez's A1c is down to 6.8. She is on Metformin and Trulicity. She had an endocrinology appointment today.   2. Hypertension associated with diabetes ([place]) Kelly Mendez's BP is slightly elevated today. She denies chest pain, chest pressure and headache. Pt is on Cozaar.  BP Readings from Last 3 Encounters:  10/22/20 (!) 141/90  10/22/20 124/88  10/03/20 114/84    Assessment/Plan:   1. Type 2 diabetes mellitus with hyperglycemia, with long-term current use of insulin (HCC) Good blood sugar control is important to decrease the likelihood of diabetic complications such as nephropathy, neuropathy, limb loss, blindness, coronary artery disease, and death. Intensive lifestyle modification including diet, exercise and weight loss are the first line of treatment for diabetes. Continue current meds with no change. Repeat labs in 4 months.  2. Hypertension associated with diabetes ([place]) Kelly Mendez is working on healthy weight loss and exercise to improve blood pressure control. We will watch for signs of hypotension as she  continues her lifestyle modifications. Follow up BP at next appointment.  3. Class 3 severe obesity with serious comorbidity and body mass index (BMI) of 45.0 to 49.9 in adult, unspecified obesity type (HCC) Kelly Mendez is currently in the action stage of change. As such, her goal is to continue with weight loss efforts. She has agreed to the Category 4 Plan.   Exercise goals: As is  Behavioral modification strategies: increasing lean protein intake, no skipping meals, meal planning and cooking strategies and keeping healthy foods in the home.  Kelly Mendez has agreed to follow-up with our clinic in 3 weeks. She was informed of the importance of frequent follow-up visits to maximize her success with intensive lifestyle modifications for her multiple health conditions.   Objective:   Blood pressure (!) 141/90, pulse 95, temperature 97.9 F (36.6 C), height 6' (1.829 m), weight (!) 331 lb (150.1 kg), last menstrual period 01/10/2017, SpO2 97 %. Body mass index is 44.89 kg/m.  General: Cooperative, alert, well developed, in no acute distress. HEENT: Conjunctivae and lids unremarkable. Cardiovascular: Regular rhythm.  Lungs: Normal work of breathing. Neurologic: No focal deficits.   Lab Results  Component Value Date   CREATININE 0.82 09/03/2020   BUN 13 09/03/2020   NA 138 09/03/2020   K 4.3 09/03/2020   CL 103 09/03/2020   CO2 24 09/03/2020   Lab Results  Component Value Date   ALT 28 09/03/2020   AST 16 09/03/2020   ALKPHOS 117 09/03/2020   BILITOT 0.4 09/03/2020   Lab Results  Component Value Date   HGBA1C 6.8 (A) 10/22/2020   HGBA1C 7.0 (H)  05/01/2020   HGBA1C 5.9 (A) 12/19/2019   HGBA1C 7.5 (A) 08/19/2019   HGBA1C 7.0 (A) 08/13/2018   Lab Results  Component Value Date   INSULIN 46.1 (H) 08/01/2020   Lab Results  Component Value Date   TSH 3.480 04/27/2020   Lab Results  Component Value Date   CHOL 109 04/27/2020   HDL 30 (L) 04/27/2020   LDLCALC 62 04/27/2020   TRIG  85 04/27/2020   CHOLHDL 3.6 04/27/2020   Lab Results  Component Value Date   WBC 6.2 09/03/2020   HGB 13.0 09/03/2020   HCT 41.5 09/03/2020   MCV 84.0 09/03/2020   PLT 244 09/03/2020   Lab Results  Component Value Date   FERRITIN 188 08/11/2017     Attestation Statements:   Reviewed by clinician on day of visit: allergies, medications, problem list, medical history, surgical history, family history, social history, and previous encounter notes.  Coral Ceo, am acting as transcriptionist for Coralie Common, MD.   I have reviewed the above documentation for accuracy and completeness, and I agree with the above. - Jinny Blossom, MD

## 2020-10-29 ENCOUNTER — Encounter: Payer: Self-pay | Admitting: Family Medicine

## 2020-11-03 ENCOUNTER — Other Ambulatory Visit (INDEPENDENT_AMBULATORY_CARE_PROVIDER_SITE_OTHER): Payer: Self-pay | Admitting: Family Medicine

## 2020-11-03 DIAGNOSIS — E1165 Type 2 diabetes mellitus with hyperglycemia: Secondary | ICD-10-CM

## 2020-11-03 DIAGNOSIS — Z794 Long term (current) use of insulin: Secondary | ICD-10-CM

## 2020-11-05 NOTE — Telephone Encounter (Signed)
Dr.Ukleja 

## 2020-11-07 ENCOUNTER — Other Ambulatory Visit (INDEPENDENT_AMBULATORY_CARE_PROVIDER_SITE_OTHER): Payer: Self-pay | Admitting: Family Medicine

## 2020-11-07 DIAGNOSIS — E1165 Type 2 diabetes mellitus with hyperglycemia: Secondary | ICD-10-CM

## 2020-11-07 DIAGNOSIS — Z794 Long term (current) use of insulin: Secondary | ICD-10-CM

## 2020-11-08 MED ORDER — TRULICITY 3 MG/0.5ML ~~LOC~~ SOAJ: 3.0000 mg | SUBCUTANEOUS | 0 refills | Status: DC

## 2020-11-08 NOTE — Telephone Encounter (Signed)
Dr. U

## 2020-11-12 ENCOUNTER — Encounter (INDEPENDENT_AMBULATORY_CARE_PROVIDER_SITE_OTHER): Payer: Self-pay

## 2020-11-12 ENCOUNTER — Ambulatory Visit (INDEPENDENT_AMBULATORY_CARE_PROVIDER_SITE_OTHER): Payer: 59 | Admitting: Family Medicine

## 2020-11-21 ENCOUNTER — Ambulatory Visit: Payer: 59 | Admitting: Specialist

## 2020-12-04 ENCOUNTER — Encounter (INDEPENDENT_AMBULATORY_CARE_PROVIDER_SITE_OTHER): Payer: Self-pay | Admitting: Family Medicine

## 2020-12-05 ENCOUNTER — Encounter: Payer: Self-pay | Admitting: Internal Medicine

## 2020-12-05 ENCOUNTER — Encounter: Payer: Self-pay | Admitting: Family Medicine

## 2020-12-05 DIAGNOSIS — I10 Essential (primary) hypertension: Secondary | ICD-10-CM

## 2020-12-05 DIAGNOSIS — E118 Type 2 diabetes mellitus with unspecified complications: Secondary | ICD-10-CM

## 2020-12-06 MED ORDER — ALBUTEROL SULFATE HFA 108 (90 BASE) MCG/ACT IN AERS: 2.0000 | INHALATION_SPRAY | Freq: Four times a day (QID) | RESPIRATORY_TRACT | 0 refills | Status: DC | PRN

## 2020-12-06 MED ORDER — ATORVASTATIN CALCIUM 20 MG PO TABS: 20.0000 mg | ORAL_TABLET | Freq: Every day | ORAL | 0 refills | Status: DC

## 2020-12-06 MED ORDER — DEXCOM G6 SENSOR MISC: 1.0000 | 1 refills | Status: DC

## 2020-12-06 MED ORDER — LOSARTAN POTASSIUM 50 MG PO TABS: 50.0000 mg | ORAL_TABLET | Freq: Every day | ORAL | 0 refills | Status: DC

## 2021-01-02 ENCOUNTER — Encounter (HOSPITAL_COMMUNITY): Payer: Self-pay | Admitting: Psychiatry

## 2021-01-02 ENCOUNTER — Other Ambulatory Visit: Payer: Self-pay

## 2021-01-02 ENCOUNTER — Telehealth (INDEPENDENT_AMBULATORY_CARE_PROVIDER_SITE_OTHER): Payer: 59 | Admitting: Psychiatry

## 2021-01-02 DIAGNOSIS — F900 Attention-deficit hyperactivity disorder, predominantly inattentive type: Secondary | ICD-10-CM | POA: Diagnosis not present

## 2021-01-02 DIAGNOSIS — F411 Generalized anxiety disorder: Secondary | ICD-10-CM

## 2021-01-02 DIAGNOSIS — F331 Major depressive disorder, recurrent, moderate: Secondary | ICD-10-CM

## 2021-01-02 MED ORDER — ATOMOXETINE HCL 40 MG PO CAPS: 40.0000 mg | ORAL_CAPSULE | Freq: Every day | ORAL | 2 refills | Status: DC

## 2021-01-02 MED ORDER — HYDROXYZINE PAMOATE 25 MG PO CAPS: 25.0000 mg | ORAL_CAPSULE | Freq: Three times a day (TID) | ORAL | 2 refills | Status: DC | PRN

## 2021-01-02 MED ORDER — DULOXETINE HCL 60 MG PO CPEP: ORAL_CAPSULE | ORAL | 2 refills | Status: DC

## 2021-01-02 NOTE — Progress Notes (Signed)
[facility] MD/PA/NP OP Progress Note Virtual Visit via Telephone Note  I connected with Kelly Mendez on 01/02/21 at  2:30 PM EDT by telephone and verified that I am speaking with the correct person using two identifiers.  Location: Patient: home Provider: Clinic   I discussed the limitations, risks, security and privacy concerns of performing an evaluation and management service by telephone and the availability of in person appointments. I also discussed with the patient that there may be a patient responsible charge related to this service. The patient expressed understanding and agreed to proceed.   I provided 30 minutes of non-face-to-face time during this encounter.   01/02/2021 10:11 AM MESHA Mendez  MRN:  [id number]  Chief Complaint: "I am having a little difficulty getting my medication"  History of Present Illness:   45 y.o. female seen today for follow up psychiatric evaluation.  She has a psychiatric history of PTSD, ADHD,anxiety and depression and is currently being managed on Strattera 40 mg daily, hydroxyzine 25 mg 3 times daily as needed, Cymbalta 60 mg daily, and gabapentin 200 mg 3 times daily (recives from PCP).   She notes her medications are effective in managing her psychiatric conditions.  Today she was unable to logon virtually so her assessment was done over the phone.  During exam she was pleasant, cooperative, engaged in conversation.  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  She however informed provider that she has been having difficulty getting some of her medications.  She notes that she has not been able to get Strattera and reports that she is having problems concentrating at work.  She informed Probation officer that her anxiety and depression continues to be minimal.  Provider conducted a GAD-7 and patient scored a 6, at her last visit she scored 10.  Provider also conducted a PHQ-9 and patient scored a 7, at her last visit she  scored a 14.  She notes that since being in [place] she is more active and reports that she is sleeping well throughout the night.  She endorses having an increased appetite however notes that she has been maintaining her weight.  Today she denies SI/HI/VAH or paranoia.  Patient informed Probation officer that her 2 sons are doing well.  She notes her youngest son is visiting his aunt in [place] and his oldest son is in [place] spending time with friends.  She notes that she is hopeful that they will be able to visit [place] soon so that she can take them to a different [place] parks.   No medication changes made.  Patient medication sent to [facility] in [place].  Patient notes that she will be back in [place] in August. No other concerns noted at this time.    Visit Diagnosis:    ICD-10-CM   1. Generalized anxiety disorder  F41.1 hydrOXYzine (VISTARIL) 25 MG capsule    DULoxetine (CYMBALTA) 60 MG capsule    2. Moderate episode of recurrent major depressive disorder (HCC)  F33.1 DULoxetine (CYMBALTA) 60 MG capsule    3. 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   Concussion    around age 67   Depression    Diabetes type 2, uncontrolled ([place])    new diagnosis in 08/2016   DVT (deep venous thrombosis) ([place]) 11/24/2016   in rt arm   Family history of breast cancer    Family history of neurofibromatosis    Family history of ovarian cancer    History of radiation therapy 04/21/17- 05/20/17   Right Breast 40.05 Gy in 15 fractions followed  by a 10 Gy boost in 5 fractions to yield a total dose of 50.05 Gy   HPV in female    Hyperlipemia    Hypertension    Malignant neoplasm of upper-inner quadrant of right breast in female, estrogen receptor negative ([place]) 10/21/2016   Neuromuscular disorder ([place])    neuropathy   Pelvic mass in female 06/16/2017   Personal history of chemotherapy 2018   Personal history of radiation therapy 2018   Pneumonia    2014    PTSD (post-traumatic stress disorder)    Sciatica    Sleep apnea    SOB (shortness of breath) on exertion    Vitamin D deficiency     Past Surgical History:  Procedure Laterality Date   BREAST LUMPECTOMY Right 03/18/2017   BREAST LUMPECTOMY WITH RADIOACTIVE SEED AND SENTINEL LYMPH NODE BIOPSY Right 03/18/2017   Procedure: RIGHT BREAST LUMPECTOMY WITH RADIOACTIVE SEED AND RIGHT SENTINEL LYMPH NODE BIOPSY;  Surgeon: [name], MD;  Location: [facility];  Service: General;  Laterality: Right;   COLPOSCOPY     left ovary removed  1978   was told her ovary was removed but Dr. Denman George found that was not the case. Suspect this was originally an ovarian cystectomy   LUMBAR LAMINECTOMY/DECOMPRESSION MICRODISCECTOMY N/A 05/04/2020   Procedure: BILATERAL LUMBAR FIVE THROUGH SACRAL ONE MICRODISCECTOMY;  Surgeon: Jessy Oto, MD;  Location: [facility];  Service: Orthopedics;  Laterality: N/A;   PORTACATH PLACEMENT Right 10/29/2016   Procedure: INSERTION PORT-A-CATH WITH [place];  Surgeon: [name], MD;  Location: [facility];  Service: General;  Laterality: Right;   portacath removal     oct. 5 2018   ROBOTIC ASSISTED TOTAL HYSTERECTOMY Bilateral 06/16/2017   Procedure: XI ROBOTIC ASSISTED TOTAL HYSTERECTOMY WITH BILATERAL SALPINGECTOMY,  OOPHERECTOMY;  Surgeon: [name], MD;  Location: [facility];  Service: Gynecology;  Laterality: Bilateral;    Family Psychiatric History: Mother Cocaine abuse and Father heroin abuse  Family History:  Family History  Problem Relation Age  of Onset   Breast cancer Mother 13   Depression Mother    Drug abuse Mother    Hepatitis C Father    Diabetes Father    Hypertension Father    Drug abuse Father    Obesity Father    Ovarian cancer Maternal Aunt        dx in her 44s   Neurofibromatosis Maternal Uncle    Brain cancer Maternal Uncle 45   Lung cancer Maternal Grandfather    Kidney failure Paternal Grandmother    Heart attack Paternal Grandfather    Ovarian cancer Maternal Aunt        dx in her 59s   Neurofibromatosis Maternal Aunt    Ovarian cancer Maternal Aunt    Neurofibromatosis Maternal Aunt    Cervical cancer Maternal Aunt    Neurofibromatosis Maternal  Aunt    Cancer Maternal Aunt        cancer on the bottom of her foot   Breast cancer Other        MGF's sisters   Neurofibromatosis Other        MGM's paternal aunt    Social History:  Social History   Socioeconomic History   Marital status: Married    Spouse name: Not on file   Number of children: Not on file   Years of education: Not on file   Highest education level: Not on file  Occupational History   Not on file  Tobacco Use   Smoking status: Former    Packs/day: 0.15    Years: 25.00    Pack years: 3.75    Types: Cigarettes    Quit date: 11/25/2016    Years since quitting: 4.1   Smokeless tobacco: Never  Vaping Use   Vaping Use: Never used  Substance and Sexual Activity   Alcohol use: Yes    Alcohol/week: 1.0 standard drink    Types: 1 Glasses of wine per week    Comment: rarely    Drug use: No   Sexual activity: Not Currently    Birth control/protection: Surgical    Comment: husband vasectomy  Other Topics Concern   Not on file  Social History Narrative   Not on file   Social Determinants of Health   Financial Resource Strain: Not on file  Food Insecurity: Not on file  Transportation Needs: Not on file  Physical Activity: Not on file  Stress: Not on file  Social Connections: Not on file    Allergies:  Allergies   Allergen Reactions   Lisinopril     Cough, itchy throat   Penicillins Other (See Comments)    As a child Has patient had a PCN reaction causing immediate rash, facial/tongue/throat swelling, SOB or lightheadedness with hypotension: unknown Has patient had a PCN reaction causing severe rash involving mucus membranes or skin necrosis: unknown Has patient had a PCN reaction that required hospitalization unknown Has patient had a PCN reaction occurring within the last 10 years: no If all of the above answers are "NO", then may proceed with Cephalosporin use.     Metabolic Disorder Labs: Lab Results  Component Value Date   HGBA1C 6.8 (A) 10/22/2020   MPG 154 05/01/2020   MPG 136.98 06/12/2017   No results found for: PROLACTIN Lab Results  Component Value Date   CHOL 109 04/27/2020   TRIG 85 04/27/2020   HDL 30 (L) 04/27/2020   CHOLHDL 3.6 04/27/2020   VLDL 13 09/29/2016   LDLCALC 62 04/27/2020   LDLCALC 40 10/24/2019   Lab Results  Component Value Date   TSH 3.480 04/27/2020   TSH 3.060 10/24/2019    Therapeutic Level Labs: No results found for: LITHIUM No results found for: VALPROATE No components found for:  CBMZ  Current Medications: Current Outpatient Medications  Medication Sig Dispense Refill   albuterol (VENTOLIN HFA) 108 (90 Base) MCG/ACT inhaler Inhale 2 puffs into the lungs every 6 (six) hours as needed for wheezing or shortness of breath. 8 g 0   atomoxetine (STRATTERA) 40 MG capsule Take 1 capsule (40 mg total) by mouth daily. 30 capsule 2   atorvastatin (LIPITOR) 20 MG tablet Take 1 tablet (20 mg total) by mouth daily. 90 tablet 0   Continuous Blood Gluc Sensor (DEXCOM G6 SENSOR) MISC 1 Device by Does not apply route as directed. 9 each  1   Continuous Blood Gluc Transmit (DEXCOM G6 TRANSMITTER) MISC Use as instructed to check blood sugar daily (Patient not taking: Reported on 10/22/2020) 1 each 2   diclofenac (CATAFLAM) 50 MG tablet Take 1 tablet (50 mg  total) by mouth 3 (three) times daily. 90 tablet 3   Dulaglutide (TRULICITY) 3 ZH/0.8MV SOPN Inject 3 mg as directed once a week. 2 mL 0   DULoxetine (CYMBALTA) 60 MG capsule TAKE 1 CAPSULE (60 MG TOTAL) BY MOUTH DAILY. START 1 CAPSULE DAILY IN 14 DAYS WHEN EFFEXOR IS AT 1 TAB DAILY 30 capsule 2   gabapentin (NEURONTIN) 100 MG capsule Take 2 capsules (200 mg total) by mouth 3 (three) times daily. 180 capsule 2   hydrOXYzine (VISTARIL) 25 MG capsule Take 1 capsule (25 mg total) by mouth 3 (three) times daily as needed. 90 capsule 2   losartan (COZAAR) 50 MG tablet Take 1 tablet (50 mg total) by mouth daily. 90 tablet 0   metFORMIN (GLUCOPHAGE-XR) 500 MG 24 hr tablet Take 2 tablets (1,000 mg total) by mouth 2 (two) times daily with a meal. 784 tablet 3   TRULICITY 3 ON/6.2XB SOPN INJECT 3 MG SUBCUTANEOUSLY  ONCE A WEEK 4 mL 0   Vitamin D, Ergocalciferol, (DRISDOL) 1.25 MG (50000 UNIT) CAPS capsule Take 1 capsule (50,000 Units total) by mouth every 7 (seven) days. 4 capsule 0   No current facility-administered medications for this visit.     Musculoskeletal: Strength & Muscle Tone:  Unable to assess due to telephone visit Gait & Station:  Unable to assess due to telephone visit Patient leans: N/A  Psychiatric Specialty Exam: Review of Systems  Last menstrual period 01/10/2017.There is no height or weight on file to calculate BMI.  General Appearance:  Unable to assess due to telephone visit  Eye Contact:   Unable to assess due to telephone visit  Speech:  Clear and Coherent and Normal Rate  Volume:  Normal  Mood:  Euthymic  Affect:  Appropriate and Congruent  Thought Process:  Coherent, Goal Directed and Linear  Orientation:  Full (Time, Place, and Person)  Thought Content: WDL and Logical   Suicidal Thoughts:  No  Homicidal Thoughts:  No  Memory:  Immediate;   Good Recent;   Good Remote;   Good  Judgement:  Good  Insight:  Good  Psychomotor Activity:  Normal  Concentration:   Concentration: Good and Attention Span: Good  Recall:  Good  Fund of Knowledge: Good  Language: Good  Akathisia:  No  Handed:  Right  AIMS (if indicated): Not done  Assets:  Communication Skills Desire for Improvement Financial Resources/Insurance Housing Leisure Time Physical Health Social Support  ADL's:  Intact  Cognition: WNL  Sleep:  Good   Screenings: GAD-7    Flowsheet Row Video Visit from 01/02/2021 in [facility] Video Visit from 10/10/2020 in [facility]  Total GAD-7 Score 6 10      PHQ2-9    Flowsheet Row Video Visit from 01/02/2021 in [facility] Video Visit from 10/10/2020 in [facility] Office Visit from 07/25/2020 in [facility] from 03/08/2020 in [facility] Office Visit from 10/24/2019 in [facility]  PHQ-2 Total Score 0 2 4 4  0  PHQ-9 Total Score 7 14 19 18  --        Assessment and Plan: Patient notes that she is doing well on  her current medication regimen however notes that she has been unable to keep Strattera because 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  Today her medications were sent to [facility]  No medication changes made today.  Patient agreeable to continue all medications as prescribed.      1. Attention deficit hyperactivity disorder (ADHD), predominantly inattentive type  Continue- atomoxetine (STRATTERA) 40 MG capsule; Take 1 capsule (40 mg total) by mouth daily.  Dispense: 30 capsule; Refill: 2  2. Generalized anxiety disorder  Continue- hydrOXYzine (VISTARIL) 25 MG capsule; Take 1 capsule (25 mg total) by mouth 3 (three) times daily as needed.  Dispense: 90 capsule; Refill: 2 Continue- DULoxetine (CYMBALTA) 60 MG capsule; TAKE 1 CAPSULE (60 MG TOTAL) BY MOUTH DAILY. START 1 CAPSULE DAILY IN 14 DAYS WHEN EFFEXOR IS AT 1 TAB DAILY  Dispense: 30 capsule;  Refill: 2 Continue- gabapentin (NEURONTIN) 100 MG capsule; Take 2 capsules (200 mg total) by mouth 3 (three) times daily.  Dispense: 180 capsule; Refill: 2  3. Moderate episode of recurrent major depressive disorder (HCC)  Continue- DULoxetine (CYMBALTA) 60 MG capsule; TAKE 1 CAPSULE (60 MG TOTAL) BY MOUTH DAILY. START 1 CAPSULE DAILY IN 14 DAYS WHEN EFFEXOR IS AT 1 TAB DAILY  Dispense: 30 capsule; Refill: 2  Follow-up in 3 months Salley Slaughter, NP 01/02/2021, 10:11 AM

## 2021-01-08 ENCOUNTER — Other Ambulatory Visit: Payer: Self-pay | Admitting: Internal Medicine

## 2021-01-08 ENCOUNTER — Encounter: Payer: Self-pay | Admitting: Family Medicine

## 2021-01-08 DIAGNOSIS — G4733 Obstructive sleep apnea (adult) (pediatric): Secondary | ICD-10-CM

## 2021-03-14 ENCOUNTER — Other Ambulatory Visit: Payer: 59

## 2021-03-19 ENCOUNTER — Other Ambulatory Visit: Payer: 59

## 2021-03-19 ENCOUNTER — Encounter: Payer: Self-pay | Admitting: Oncology

## 2021-04-05 ENCOUNTER — Other Ambulatory Visit: Payer: Self-pay

## 2021-04-05 ENCOUNTER — Telehealth (HOSPITAL_COMMUNITY): Payer: 59 | Admitting: Psychiatry

## 2021-04-21 ENCOUNTER — Other Ambulatory Visit (INDEPENDENT_AMBULATORY_CARE_PROVIDER_SITE_OTHER): Payer: Self-pay | Admitting: Family Medicine

## 2021-04-21 ENCOUNTER — Other Ambulatory Visit (HOSPITAL_COMMUNITY): Payer: Self-pay | Admitting: Psychiatry

## 2021-04-21 DIAGNOSIS — E1165 Type 2 diabetes mellitus with hyperglycemia: Secondary | ICD-10-CM

## 2021-04-21 DIAGNOSIS — F900 Attention-deficit hyperactivity disorder, predominantly inattentive type: Secondary | ICD-10-CM

## 2021-04-22 ENCOUNTER — Other Ambulatory Visit: Payer: Self-pay

## 2021-04-22 ENCOUNTER — Ambulatory Visit (INDEPENDENT_AMBULATORY_CARE_PROVIDER_SITE_OTHER): Payer: 59 | Admitting: Internal Medicine

## 2021-04-22 VITALS — BP 118/72 | HR 82 | Ht 72.0 in | Wt 322.2 lb

## 2021-04-22 DIAGNOSIS — E1142 Type 2 diabetes mellitus with diabetic polyneuropathy: Secondary | ICD-10-CM

## 2021-04-22 DIAGNOSIS — Z794 Long term (current) use of insulin: Secondary | ICD-10-CM | POA: Diagnosis not present

## 2021-04-22 DIAGNOSIS — E1165 Type 2 diabetes mellitus with hyperglycemia: Secondary | ICD-10-CM

## 2021-04-22 LAB — POCT GLYCOSYLATED HEMOGLOBIN (HGB A1C): Hemoglobin A1C: 7.7 % — AB (ref 4.0–5.6)

## 2021-04-22 MED ORDER — GLIPIZIDE 5 MG PO TABS
5.0000 mg | ORAL_TABLET | Freq: Every day | ORAL | 2 refills | Status: DC
Start: 2021-04-22 — End: 2021-11-08

## 2021-04-22 MED ORDER — TRULICITY 3 MG/0.5ML ~~LOC~~ SOAJ
3.0000 mg | SUBCUTANEOUS | 0 refills | Status: DC
Start: 2021-04-22 — End: 2021-07-04

## 2021-04-22 MED ORDER — TRULICITY 3 MG/0.5ML ~~LOC~~ SOAJ: 3.0000 mg | SUBCUTANEOUS | 3 refills | Status: DC

## 2021-04-22 MED ORDER — METFORMIN HCL ER 500 MG PO TB24: 1000.0000 mg | ORAL_TABLET | Freq: Two times a day (BID) | ORAL | 3 refills | Status: DC

## 2021-04-22 NOTE — Patient Instructions (Signed)
-   Continue   Metformin 500 mg XR, 2 tablet with Breakfast and 2 tablet with supper  - Continue  trulicity 3 mg weekly  - Start Glipizide 5 mg, 1 tablet before first meal of the day    HOW TO TREAT LOW BLOOD SUGARS (Blood sugar LESS THAN 70 MG/DL) Please follow the RULE OF 15 for the treatment of hypoglycemia treatment (when your (blood sugars are less than 70 mg/dL)   STEP 1: Take 15 grams of carbohydrates when your blood sugar is low, which includes:  3-4 GLUCOSE TABS  OR 3-4 OZ OF JUICE OR REGULAR SODA OR ONE TUBE OF GLUCOSE GEL    STEP 2: RECHECK blood sugar in 15 MINUTES STEP 3: If your blood sugar is still low at the 15 minute recheck --> then, go back to STEP 1 and treat AGAIN with another 15 grams of carbohydrates.

## 2021-04-22 NOTE — Progress Notes (Signed)
Name: Kelly Mendez  Age/ Sex: 45 y.o., female   MRN/ DOB: [id number], December 30, 1975     PCP: Denita Lung, MD   Reason for Endocrinology Evaluation: Type 2 Diabetes Mellitus     Initial Endocrinology Clinic Visit: 09/03/2018    PATIENT IDENTIFIER: Kelly Mendez is a 45 y.o. female with a past medical history of Right breast ca (03/19/2017 S/P lumpectomy, chemo and radiation) and T2DM. The patient has followed with Endocrinology clinic since 09/03/2018 for consultative assistance with management of her diabetes.  DIABETIC HISTORY:  Kelly Mendez was diagnosed with T2DM in 2018. She has been on metformin since diagnosis, trulicity added in 3244 , she has not been on insulin. Her hemoglobin A1c has ranged from 6.7% in 2019, peaking at 8.8% in 2018.   Trulicity increase to 3 mg by the [facility]    SUBJECTIVE:   During the last visit (10/22/2020): A1c 6.8 %. Continued  Metformin and  Trulicity    Today (07/16/7251): [name] is here for a follow up on her diabetes management.  She has been checking her sugar  multiple x a day through [facility] . She patient has not had hypoglycemic episodes since the last clinic visit    She was in [place] from May until August at a [facility] internship program    She was out of Trulicity from May until June Continues to follow up with the [facility] healthy weight and wellness  Continue to be in remission from breast ca.   She is tired today, did not sleep well  She has minor GI symptoms following the Trulicity intake - tolerable at this time   [place]:  Metformin 500 mg 2 tabs  BID   Trulicity 3  mg weekly    Statin: yes ACE-I/ARB: no    CONTINUOUS GLUCOSE MONITORING RECORD INTERPRETATION    Dates of Recording: 9/23-10/12/2020  Sensor description:dexcom   Results statistics:   CGM use % of time 93  Average and SD 145/23  Time in range     93   %  % Time Above 180 6  % Time above 250 0  % Time Below  target <1     Glycemic patterns summary: Bg's at goal through the day and night with the occasional hyperglycemia during the day after a meal   Hyperglycemic episodes  postprandial  Hypoglycemic episodes occurred n/a  Overnight periods: stable      DIABETIC COMPLICATIONS: Microvascular complications:  Neuropathy  Denies: CKD, retinopathy Last eye exam: Completed 11/ 2021   Macrovascular complications:    Denies: CAD, PVD, CVA     PHYSICAL EXAM: VS: BP 118/72 (BP Location: Left Arm, Patient Position: Sitting, Cuff Size: Large)   Pulse 82   Ht 6' (1.829 m)   Wt (!) 322 lb 3.2 oz (146.1 kg)   LMP 01/10/2017   SpO2 99%   BMI 43.70 kg/m    EXAM: General: Pt appears well and is in NAD  Lungs: Clear with good BS bilat with no rales, rhonchi, or wheezes  Abdomen: Normoactive bowel sounds, soft, nontender, without masses or organomegaly palpable  Extremities:  BL LE: no pretibial edema normal   Mental Status: Judgment, insight: intact Mood and affect: no depression, anxiety, or agitation     DM Foot exam 04/22/2021 The skin of the feet is without sores or ulcerations, right 1st MTH callous formation  The pedal pulses are 2+ on right and  2+ on left. 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   Concussion    around age 46   Depression    Diabetes type 2, uncontrolled ([place])    new diagnosis in 08/2016   DVT (deep venous thrombosis) ([place]) 11/24/2016   in rt arm   Family history of breast cancer    Family history of neurofibromatosis    Family history of ovarian cancer    History of radiation therapy 04/21/17-  05/20/17   Right Breast 40.05 Gy in 15 fractions followed by a 10 Gy boost in 5 fractions to yield a total dose of 50.05 Gy   HPV in female    Hyperlipemia    Hypertension    Malignant neoplasm of upper-inner quadrant of right breast in female, estrogen receptor negative ([place]) 10/21/2016   Neuromuscular disorder ([place])    neuropathy   Pelvic mass in female 06/16/2017   Personal history of chemotherapy 2018   Personal history of radiation therapy 2018   Pneumonia    2014    PTSD (post-traumatic stress disorder)    Sciatica    Sleep apnea    SOB (shortness of breath) on exertion    Vitamin D deficiency    Past Surgical History:  Past Surgical History:  Procedure Laterality Date   BREAST LUMPECTOMY Right 03/18/2017   BREAST LUMPECTOMY WITH RADIOACTIVE SEED AND SENTINEL LYMPH NODE BIOPSY Right 03/18/2017   Procedure: RIGHT BREAST LUMPECTOMY WITH RADIOACTIVE SEED AND RIGHT SENTINEL LYMPH NODE BIOPSY;  Surgeon: [name], MD;  Location: [place] [facility];  Service: General;  Laterality: Right;   COLPOSCOPY     left ovary removed  1978   was told her ovary was removed but Dr. Denman George found that was not the case. Suspect this was originally an ovarian cystectomy   LUMBAR LAMINECTOMY/DECOMPRESSION MICRODISCECTOMY N/A 05/04/2020   Procedure: BILATERAL LUMBAR FIVE THROUGH SACRAL ONE MICRODISCECTOMY;  Surgeon: Jessy Oto, MD;  Location: [place];  Service: Orthopedics;  Laterality: N/A;   PORTACATH PLACEMENT Right 10/29/2016   Procedure: INSERTION PORT-A-CATH WITH [place];  Surgeon: [name], MD;  Location: [place];  Service: General;  Laterality: Right;   portacath removal     oct. 5 2018   ROBOTIC ASSISTED TOTAL HYSTERECTOMY Bilateral 06/16/2017   Procedure: XI ROBOTIC ASSISTED TOTAL HYSTERECTOMY WITH BILATERAL SALPINGECTOMY,  OOPHERECTOMY;  Surgeon: [name], MD;  Location: [facility];  Service: Gynecology;  Laterality: Bilateral;   Social History:  reports that she quit smoking about  4 years ago. Her smoking use included cigarettes. She has a 3.75 pack-year smoking history. She has never used smokeless tobacco. She reports current alcohol use of about 1.0 standard drink per week. She reports that she does not use drugs. Family History:  Family History  Problem Relation Age of Onset   Breast cancer Mother 17   Depression Mother    Drug abuse Mother    Hepatitis C Father    Diabetes Father    Hypertension Father  Drug abuse Father    Obesity Father    Ovarian cancer Maternal Aunt        dx in her 40s   Neurofibromatosis Maternal Uncle    Brain cancer Maternal Uncle 8   Lung cancer Maternal Grandfather    Kidney failure Paternal Grandmother    Heart attack Paternal Grandfather    Ovarian cancer Maternal Aunt        dx in her 34s   Neurofibromatosis Maternal Aunt    Ovarian cancer Maternal Aunt    Neurofibromatosis Maternal Aunt    Cervical cancer Maternal Aunt    Neurofibromatosis Maternal Aunt    Cancer Maternal Aunt        cancer on the bottom of her foot   Breast cancer Other        MGF's sisters   Neurofibromatosis Other        MGM's paternal aunt     HOME MEDICATIONS: Allergies as of 04/22/2021       Reactions   Lisinopril    Cough, itchy throat   Penicillins Other (See Comments)   As a child Has patient had a PCN reaction causing immediate rash, facial/tongue/throat swelling, SOB or lightheadedness with hypotension: unknown Has patient had a PCN reaction causing severe rash involving mucus membranes or skin necrosis: unknown Has patient had a PCN reaction that required hospitalization unknown Has patient had a PCN reaction occurring within the last 10 years: no If all of the above answers are "NO", then may proceed with Cephalosporin use.        Medication List        Accurate as of April 22, 2021  1:06 PM. If you have any questions, ask your nurse or doctor.          STOP taking these medications    Vitamin D  (Ergocalciferol) 1.25 MG (50000 UNIT) Caps capsule Commonly known as: DRISDOL Stopped by: Dorita Sciara, MD       TAKE these medications    albuterol 108 (90 Base) MCG/ACT inhaler Commonly known as: VENTOLIN HFA Inhale 2 puffs into the lungs every 6 (six) hours as needed for wheezing or shortness of breath.   atomoxetine 40 MG capsule Commonly known as: STRATTERA Take 1 capsule by mouth once daily   atorvastatin 20 MG tablet Commonly known as: LIPITOR Take 1 tablet (20 mg total) by mouth daily.   cholecalciferol 25 MCG (1000 UNIT) tablet Commonly known as: VITAMIN D3 Take 1,000 Units by mouth daily.   Dexcom G6 Sensor Misc 1 Device by Does not apply route as directed.   Dexcom G6 Transmitter Misc Use as instructed to check blood sugar daily   diclofenac 50 MG tablet Commonly known as: CATAFLAM Take 1 tablet (50 mg total) by mouth 3 (three) times daily.   DULoxetine 60 MG capsule Commonly known as: CYMBALTA TAKE 1 CAPSULE (60 MG TOTAL) BY MOUTH DAILY. START 1 CAPSULE DAILY IN 14 DAYS WHEN EFFEXOR IS AT 1 TAB DAILY   gabapentin 100 MG capsule Commonly known as: Neurontin Take 2 capsules (200 mg total) by mouth 3 (three) times daily.   glipiZIDE 5 MG tablet Commonly known as: GLUCOTROL Take 1 tablet (5 mg total) by mouth daily before breakfast. Started by: Dorita Sciara, MD   hydrOXYzine 25 MG capsule Commonly known as: VISTARIL Take 1 capsule (25 mg total) by mouth 3 (three) times daily as needed.   losartan 50 MG tablet Commonly known as: COZAAR Take 1 tablet (  50 mg total) by mouth daily.   metFORMIN 500 MG 24 hr tablet Commonly known as: GLUCOPHAGE-XR Take 2 tablets (1,000 mg total) by mouth 2 (two) times daily with a meal.   Trulicity 3 OZ/3.6UY Sopn Generic drug: Dulaglutide INJECT 3 MG SUBCUTANEOUSLY  ONCE A WEEK   Trulicity 3 QI/3.4VQ Sopn Generic drug: Dulaglutide Inject 3 mg as directed once a week.          DATA  REVIEWED:  Lab Results  Component Value Date   HGBA1C 7.7 (A) 04/22/2021   HGBA1C 6.8 (A) 10/22/2020   HGBA1C 7.0 (H) 05/01/2020   Lab Results  Component Value Date   MICROALBUR 4.4 (H) 10/22/2020   LDLCALC 62 04/27/2020   CREATININE 0.82 09/03/2020   Lab Results  Component Value Date   MICRALBCREAT 2.1 10/22/2020     Lab Results  Component Value Date   CHOL 109 04/27/2020   HDL 30 (L) 04/27/2020   LDLCALC 62 04/27/2020   TRIG 85 04/27/2020   CHOLHDL 3.6 04/27/2020       Results for Ryback, RONNEISHA JETT (MRN [id number]) as of 10/22/2020 14:45  Ref. Range 10/22/2020 09:06  Creatinine,U Latest Units: mg/dL 211.7  Microalb, Ur Latest Ref Range: 0.0 - 1.9 mg/dL 4.4 (H)  MICROALB/CREAT RATIO Latest Ref Range: 0.0 - 30.0 mg/g 2.1    ASSESSMENT / PLAN / RECOMMENDATIONS:   1) Type 2 Diabetes Mellitus, Sub- Optimally controlled, With neuropathic complications - Most recent A1c of 7.7 %. Goal A1c <7.0 %.  - A1c increased from 6.8%,pt attributes this to CHO intake  - We discussed add-on therapy with SU vs SGLT-2 inhibitors, we discussed the benefits of both, given the intermittent interruption of her medication supply with Trulicity and SGLT-2 inhibitors would fall in the same cost category, we opted to proceed with Glipizide for better coverage consistency at this time   MEDICATIONS: Continue Metformin 500 mg, 2 tabs BID  Continue Trulicity 3 mg weekly Start Glipizide 5 mg, 1 tablet before breakfast   EDUCATION / INSTRUCTIONS: BG monitoring instructions: Patient is instructed to check her blood sugars 2 times a day, fasting and bedtime. Call [facility] if: BG persistently < 70  I reviewed the Rule of 15 for the treatment of hypoglycemia in detail with the patient. Literature supplied.     2) Diabetic complications:  Eye: Does not have known diabetic retinopathy.  Patient encouraged to have an eye exam ASAP Neuro/ Feet: Does have known diabetic peripheral  neuropathy .  Renal: Patient does not have known baseline CKD. She   is not on an ACEI/ARB at present.    F/U in 3 months    Signed electronically by: Mack Guise, MD  [facility]  [facility] [address] [place], [place] [address] Phone: [phone] FAX: [phone]   CC: Denita Lung, [name] [address] Phone: [phone]  Fax: [phone]  Return to Endocrinology clinic as below: Future Appointments  Date Time Provider [place]  04/23/2021 10:00 AM [name], NP-C [facility] [place]  06/05/2021  1:30 PM [facility] None  06/05/2021  2:00 PM Magrinat, Virgie Dad, MD [facility] None

## 2021-04-23 ENCOUNTER — Ambulatory Visit: Payer: 59 | Admitting: Family Medicine

## 2021-04-23 ENCOUNTER — Encounter: Payer: Self-pay | Admitting: Family Medicine

## 2021-04-23 MED ORDER — SCOPOLAMINE 1 MG/3DAYS TD PT72: 1.0000 | MEDICATED_PATCH | TRANSDERMAL | 0 refills | Status: DC

## 2021-04-29 ENCOUNTER — Other Ambulatory Visit: Payer: 59

## 2021-04-29 ENCOUNTER — Ambulatory Visit: Payer: 59 | Admitting: Internal Medicine

## 2021-04-29 ENCOUNTER — Ambulatory Visit: Payer: 59 | Admitting: Oncology

## 2021-05-08 ENCOUNTER — Ambulatory Visit: Payer: 59 | Admitting: Family Medicine

## 2021-06-04 ENCOUNTER — Other Ambulatory Visit: Payer: Self-pay | Admitting: *Deleted

## 2021-06-04 ENCOUNTER — Other Ambulatory Visit: Payer: Self-pay

## 2021-06-04 DIAGNOSIS — C50211 Malignant neoplasm of upper-inner quadrant of right female breast: Secondary | ICD-10-CM

## 2021-06-05 ENCOUNTER — Ambulatory Visit: Payer: 59 | Admitting: Oncology

## 2021-06-05 ENCOUNTER — Other Ambulatory Visit: Payer: 59

## 2021-06-28 ENCOUNTER — Other Ambulatory Visit: Payer: 59

## 2021-06-28 ENCOUNTER — Ambulatory Visit: Payer: 59 | Admitting: Adult Health

## 2021-07-04 ENCOUNTER — Other Ambulatory Visit: Payer: Self-pay | Admitting: Internal Medicine

## 2021-07-04 DIAGNOSIS — E1165 Type 2 diabetes mellitus with hyperglycemia: Secondary | ICD-10-CM

## 2021-07-04 MED ORDER — TRULICITY 3 MG/0.5ML ~~LOC~~ SOAJ: 3.0000 mg | SUBCUTANEOUS | 0 refills | Status: DC

## 2021-07-04 NOTE — Progress Notes (Signed)
[facility]  [phone] Fax:([phone]) [phone]     ID: TONILYNN BIEKER DOB: [birth year]  MR#: [id number]  GYF#:[id number]  Patient Care Team: Denita Lung, MD as PCP - General (Family Medicine) [name], MD as Consulting Physician (General Surgery) Eppie Gibson, MD as Attending Physician (Radiation Oncology) Buena Irish, LCSW as Social Worker (Licensed Clinical Social Worker) Delice Bison, Charlestine Massed, NP as Nurse Practitioner (Hematology and Oncology) OTHER MD:   CHIEF COMPLAINT: Triple negative breast cancer; DVT  CURRENT TREATMENT: Observation   INTERVAL HISTORY: Hermenia returns today for follow-up of her triple negative breast cancer.  Kali notes that she is doing quite well.  She is discouraged that she has gained some weight since she was here last, however she is working on weight loss.  She denies any new breast changes.  She is feeling well without any questions or concerns.  She did miss her mammogram this year, and needs to get this scheduled.   REVIEW OF SYSTEMS: Review of Systems  Constitutional:  Negative for appetite change, chills, fatigue, fever and unexpected weight change.  HENT:   Negative for hearing loss, lump/mass and trouble swallowing.   Eyes:  Negative for eye problems and icterus.  Respiratory:  Negative for chest tightness, cough and shortness of breath.   Cardiovascular:  Negative for chest pain, leg swelling and palpitations.  Gastrointestinal:  Negative for abdominal distention, abdominal pain, constipation, diarrhea, nausea and vomiting.  Endocrine: Negative for hot flashes.  Genitourinary:  Negative for difficulty urinating.   Musculoskeletal:  Negative for arthralgias.  Skin:  Negative for itching and rash.  Neurological:  Negative for dizziness, extremity weakness, headaches and numbness.  Hematological:  Negative for adenopathy. Does not bruise/bleed easily.  Psychiatric/Behavioral:  Negative for  depression. The patient is not nervous/anxious.      COVID 19 VACCINATION STATUS: Moderna x1 in 01/2020; infection 07/2020   BREAST CANCER HISTORY: From the original intake note:  Shreya had her first ever mammogram 10/07/2016 at the [facility]. This showed a possible mass in the right breast. On 10/13/2016 she underwent bilateral diagnostic mammography with tomography and right breast ultrasonography. This found the breast density to be category B. In the upper inner quadrant of the right breast there was a circumscribed mass which was barely palpable. Ultrasonography confirmed a 0.9 cm right breast mass at the 1:00 radiant 18 cm from the nipple ultrasound of the axilla showed 1 indeterminate right axillary lymph node with borderline thickening of the anterior cortex.  On 10/16/2016 the patient underwent biopsy of the right breast mass and the suspicious right axillary lymph node. The right breast mass proved to be an invasive ductal carcinoma, grade 3, estrogen and progesterone receptor negative, with an MIB-1 of 80%, and no HER-2 amplification, the signals ratio being 1.30 and the number per cell 1.75. The lymph node was negative and concordant.  Her subsequent history is as detailed below   PAST MEDICAL HISTORY: Past Medical History:  Diagnosis Date   Abnormal Pap smear of cervix    ADHD    Anemia    Anxiety    Asthma    Asymptomatic microscopic hematuria 09/29/2017   Back pain    Bilateral swelling of feet    Cancer (HCC)    Right breast ca triple negative  stage 1 grade 3   Concussion    around age 36   Depression    Diabetes type 2, uncontrolled    new diagnosis in 08/2016  DVT (deep venous thrombosis) ([place]) 11/24/2016   in rt arm   Family history of breast cancer    Family history of neurofibromatosis    Family history of ovarian cancer    History of radiation therapy 04/21/17- 05/20/17   Right Breast 40.05 Gy in 15 fractions followed by a 10 Gy boost in 5 fractions to  yield a total dose of 50.05 Gy   HPV in female    Hyperlipemia    Hypertension    Malignant neoplasm of upper-inner quadrant of right breast in female, estrogen receptor negative ([facility]) 10/21/2016   Neuromuscular disorder ([place])    neuropathy   Pelvic mass in female 06/16/2017   Personal history of chemotherapy 2018   Personal history of radiation therapy 2018   Pneumonia    2014    PTSD (post-traumatic stress disorder)    Sciatica    Sleep apnea    SOB (shortness of breath) on exertion    Vitamin D deficiency     PAST SURGICAL HISTORY: Past Surgical History:  Procedure Laterality Date   BREAST LUMPECTOMY Right 03/18/2017   BREAST LUMPECTOMY WITH RADIOACTIVE SEED AND SENTINEL LYMPH NODE BIOPSY Right 03/18/2017   Procedure: RIGHT BREAST LUMPECTOMY WITH RADIOACTIVE SEED AND RIGHT SENTINEL LYMPH NODE BIOPSY;  Surgeon: [name], MD;  Location: [facility];  Service: General;  Laterality: Right;   COLPOSCOPY     left ovary removed  1978   was told her ovary was removed but Dr. Denman George found that was not the case. Suspect this was originally an ovarian cystectomy   LUMBAR LAMINECTOMY/DECOMPRESSION MICRODISCECTOMY N/A 05/04/2020   Procedure: BILATERAL LUMBAR FIVE THROUGH SACRAL ONE MICRODISCECTOMY;  Surgeon: Jessy Oto, MD;  Location: [facility];  Service: Orthopedics;  Laterality: N/A;   PORTACATH PLACEMENT Right 10/29/2016   Procedure: INSERTION PORT-A-CATH WITH [place];  Surgeon: [name], MD;  Location: [facility];  Service: General;  Laterality: Right;   portacath removal     oct. 5 2018   ROBOTIC ASSISTED TOTAL HYSTERECTOMY Bilateral 06/16/2017   Procedure: XI ROBOTIC ASSISTED TOTAL HYSTERECTOMY WITH BILATERAL SALPINGECTOMY,  OOPHERECTOMY;  Surgeon: [name], MD;  Location: [facility];  Service: Gynecology;  Laterality: Bilateral;    FAMILY HISTORY Family History  Problem Relation Age of Onset   Breast cancer Mother 57   Depression Mother    Drug abuse Mother     Hepatitis C Father    Diabetes Father    Hypertension Father    Drug abuse Father    Obesity Father    Ovarian cancer Maternal Aunt        dx in her 76s   Neurofibromatosis Maternal Uncle    Brain cancer Maternal Uncle 68   Lung cancer Maternal Grandfather    Kidney failure Paternal Grandmother    Heart attack Paternal Grandfather    Ovarian cancer Maternal Aunt        dx in her 11s   Neurofibromatosis Maternal Aunt    Ovarian cancer Maternal Aunt    Neurofibromatosis Maternal Aunt    Cervical cancer Maternal Aunt    Neurofibromatosis Maternal Aunt    Cancer Maternal Aunt        cancer on the bottom of her foot   Breast cancer Other        MGF's sisters   Neurofibromatosis Other        MGM's paternal aunt  The patient's mother was diagnosed with breast cancer at age 69, but tells me the  lump in her breast had been present for at least 10 years prior to that. She is now 19 and doing well. The patient's father died at the age of 28 from sepsis following liver transplantation. The patient has 2 brothers, no sisters. The patient tells me that she has at least 5 and sent cousins with breast cancer and there are other relatives with uterine cancer.   GYNECOLOGIC HISTORY:  Patient's last menstrual period was 01/10/2017. Menarche age 83, first live birth age 24, the patient is GX P1. She has had multiple progesterone and oral contraceptive treatments through [facility] because of her menometrorrhagia. 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 She will graduate from [employer] in 2022 with an associates degree in business.  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  Her husband [name] is a truck Geophysicist/field seismologist. The patient's daughter [name] graduated with honors from [employer] and will plan to  take a gap year at a veterinary school in the [place], completing organic chemistry and other courses she did not take in college, and then possibly proceeding to veterinary school there. [name] has 3 children, all boys, kim is a cook in [place] and lives independently. The 2 younger boys, [name] and [name], aged 70 and 25 are at home with the patient.     ADVANCED DIRECTIVES: In the absence of any documents to the contrary the patient's husband is her healthcare power of attorney   HEALTH MAINTENANCE: Social History   Tobacco Use   Smoking status: Former    Packs/day: 0.15    Years: 25.00    Pack years: 3.75    Types: Cigarettes    Quit date: 11/25/2016    Years since quitting: 4.6   Smokeless tobacco: Never  Vaping Use   Vaping Use: Never used  Substance Use Topics   Alcohol use: Yes    Alcohol/week: 1.0 standard drink    Types: 1 Glasses of wine per week    Comment: rarely    Drug use: No    Colonoscopy: n/a  PAP:  Bone density:   Allergies  Allergen Reactions   Lisinopril     Cough, itchy throat   Penicillins Other (See Comments)    As a child Has patient had a PCN reaction causing immediate rash, facial/tongue/throat swelling, SOB or lightheadedness with hypotension: unknown Has patient had a PCN reaction causing severe rash involving mucus membranes or skin necrosis: unknown Has patient had a PCN reaction that required hospitalization unknown Has patient had a PCN reaction occurring within the last 10 years: no If all of the above answers are "NO", then may proceed with Cephalosporin use.     Current Outpatient Medications  Medication Sig Dispense Refill   atomoxetine (STRATTERA) 40 MG capsule Take 1 capsule by mouth once daily 30 capsule 0   atorvastatin (LIPITOR) 20 MG tablet Take 1 tablet (20 mg total) by mouth daily. 90 tablet 0   cholecalciferol (VITAMIN D3) 25 MCG (1000 UNIT) tablet Take 1,000 Units by mouth daily.     Continuous Blood Gluc  Sensor (DEXCOM G6 SENSOR) MISC 1 Device by Does not apply route as directed. 9 each 1   Continuous Blood Gluc Transmit (DEXCOM G6 TRANSMITTER) MISC Use as instructed to check blood sugar daily 1 each  2   Dulaglutide (TRULICITY) 3 WR/6.0AV SOPN Inject 3 mg as directed once a week. 6 mL 0   DULoxetine (CYMBALTA) 60 MG capsule TAKE 1 CAPSULE (60 MG TOTAL) BY MOUTH DAILY. START 1 CAPSULE DAILY IN 14 DAYS WHEN EFFEXOR IS AT 1 TAB DAILY 30 capsule 2   gabapentin (NEURONTIN) 100 MG capsule Take 2 capsules (200 mg total) by mouth 3 (three) times daily. 180 capsule 2   glipiZIDE (GLUCOTROL) 5 MG tablet Take 1 tablet (5 mg total) by mouth daily before breakfast. 90 tablet 2   losartan (COZAAR) 50 MG tablet Take 1 tablet (50 mg total) by mouth daily. 90 tablet 0   metFORMIN (GLUCOPHAGE-XR) 500 MG 24 hr tablet Take 2 tablets (1,000 mg total) by mouth 2 (two) times daily with a meal. 360 tablet 3   No current facility-administered medications for this visit.    OBJECTIVE:  Vitals:   07/05/21 1216  BP: (!) 165/90  Pulse: 83  Resp: 18  Temp: (!) 96.8 F (36 C)  SpO2: 97%      Body mass index is 44.72 kg/m.    ECOG FS:1 - Symptomatic but completely ambulatory GENERAL: Patient is a well appearing female in no acute distress HEENT:  Sclerae anicteric.  Oropharynx clear and moist. No ulcerations or evidence of oropharyngeal candidiasis. Neck is supple.  NODES:  No cervical, supraclavicular, or axillary lymphadenopathy palpated.  BREAST EXAM: Left breast is benign.  Right breast is status postlumpectomy and radiation there is no sign of local recurrence. LUNGS:  Clear to auscultation bilaterally.  No wheezes or rhonchi. HEART:  Regular rate and rhythm. No murmur appreciated. ABDOMEN:  Soft, nontender.  Positive, normoactive bowel sounds. No organomegaly palpated. MSK:  No focal spinal tenderness to palpation. Full range of motion bilaterally in the upper extremities. EXTREMITIES:  No peripheral edema.    SKIN:  Clear with no obvious rashes or skin changes. No nail dyscrasia. NEURO:  Nonfocal. Well oriented.  Appropriate affect.    LAB RESULTS:  CMP     Component Value Date/Time   NA 138 07/05/2021 1126   NA 139 08/01/2020 1336   NA 140 05/07/2017 1221   K 4.1 07/05/2021 1126   K 4.0 05/07/2017 1221   CL 104 07/05/2021 1126   CO2 28 07/05/2021 1126   CO2 24 05/07/2017 1221   GLUCOSE 157 (H) 07/05/2021 1126   GLUCOSE 173 (H) 05/07/2017 1221   BUN 15 07/05/2021 1126   BUN 14 08/01/2020 1336   BUN 6.7 (L) 05/07/2017 1221   CREATININE 0.69 07/05/2021 1126   CREATININE 0.8 05/07/2017 1221   CALCIUM 9.4 07/05/2021 1126   CALCIUM 9.7 05/07/2017 1221   PROT 6.8 07/05/2021 1126   PROT 7.6 08/01/2020 1336   PROT 6.8 05/07/2017 1221   ALBUMIN 3.9 07/05/2021 1126   ALBUMIN 4.8 08/01/2020 1336   ALBUMIN 3.4 (L) 05/07/2017 1221   AST 16 07/05/2021 1126   AST 21 05/07/2017 1221   ALT 27 07/05/2021 1126   ALT 24 05/07/2017 1221   ALKPHOS 114 07/05/2021 1126   ALKPHOS 131 05/07/2017 1221   BILITOT 0.3 07/05/2021 1126   BILITOT 0.48 05/07/2017 1221   GFRNONAA >60 07/05/2021 1126   GFRAA 95 08/01/2020 1336    Lab Results  Component Value Date   WBC 5.5 07/05/2021   NEUTROABS 3.6 07/05/2021   HGB 12.3 07/05/2021   HCT 39.1 07/05/2021   MCV 83.0 07/05/2021   PLT 257 07/05/2021  STUDIES: No results found.   ELIGIBLE FOR AVAILABLE RESEARCH PROTOCOL: not a candidate for PREVENT   ASSESSMENT: 45 y.o.  [place] woman status post right breast upper inner quadrant biopsy 10/16/2016 for a clinical T1b pN0, stage IB invasive ductal carcinoma, grade 3, triple negative, with an MIB-1 of 80%.  (1)  genetics testing 12/12/2016 through the  Multi-Gene Panel offered by [facility] found no deleterious mutations in ALK, APC, ATM, AXIN2,BAP1,  BARD1, BLM, BMPR1A, BRCA1, BRCA2, BRIP1, CASR, CDC73, CDH1, CDK4, CDKN1B, CDKN1C, CDKN2A (p14ARF), CDKN2A (p16INK4a), CEBPA, CHEK2, CTNNA1,  DICER1, DIS3L2, EGFR (c.2369C>T, p.Thr790Met variant only), EPCAM (Deletion/duplication testing only), FH, FLCN, GATA2, GPC3, GREM1 (Promoter region deletion/duplication testing only), HOXB13 (c.251G>A, p.Gly84Glu), HRAS, KIT, MAX, MEN1, MET, MITF (c.952G>A, p.Glu318Lys variant only), MLH1, MSH2, MSH3, MSH6, MUTYH, NBN, NF1, NF2, NTHL1, PALB2, PDGFRA, PHOX2B, PMS2, POLD1, POLE, POT1, PRKAR1A, PTCH1, PTEN, RAD50, RAD51C, RAD51D, RB1, RECQL4, RET, RUNX1, SDHAF2, SDHA (sequence changes only), SDHB, SDHC, SDHD, SMAD4, SMARCA4, SMARCB1, SMARCE1, STK11, SUFU, TERT, TERT, TMEM127, TP53, TSC1, TSC2, VHL, WRN and WT1  (a) VUSs noted in BLM c.2340G>A (silent), BRIP1 c.2594G>A (p.Arg865Gln) and MUTYH c.562A>G (p.Glu188Lys)   (2) neoadjuvant chemotherapy consisting of doxorubicin and cyclophosphamide in dose dense fashion  starting 11/05/2016 to be followed by Abraxane weekly  (a), received third cycle 12/03/2016 then proceeded to weekly Abraxane starting 01/01/2017  (b) Abraxane given from 01/01/17-01/22/17 (4 cycles), stopped early due to peripheral neuropathy  (c) cycle 4 of cyclophosphamide and doxorubicin given at end of Abraxane on 02/12/2017  (3) status post right lumpectomy and sentinel lymph node sampling 03/18/2017 for a ypT1b ypN0 invasive ductal carcinoma, grade 3, with negative margins, and repeat prognostic panel again triple negative  (4) adjuvant radiation completed 05/20/2017 Site/dose:  The right breast was treated to 40.05 Gy in 15 fractions, followed by a 10 Gy boost in 5 fractions to yield a total dose of 50.05 Gy  (5) extensive right upper extremity deep venous thrombosis documented 11/23/2016, treated initially with Lovenox  (a) transitioned to rivaroxaban as of 11/30/2016  (b) total 6 months anticoagulation planned (one month beyond port removal)  (c) d-dimer 08/11/2017 normal  (d) rivaroxaban discontinued late January 2019 for financial reasons  (e) bilateral upper extremity duplex  ultrasound 10/14/2018 showed no evidence of residual DVT or superficial vein thrombosis in either upper extremity  (6) tobacco abuse: The patient quit smoking 11/26/2016  (7) menometrorrhagia, with a cystic right adnexal lesion and a possible cervical polyp noted on ultrasound 12/16/2016, with benign endocervical curettage and endometrial biopsy 12/16/2016  (a) CA 125 12/17/2016 was 14.2 (normal).  (b) adnexal mass noted, likely benign, to be explored at the completion of chemotherapy  (c) goserelin started 12/16/2016, continued every 28 days, last dose 06/05/2017  (d) status post hysterectomy and bilateral salpingo-oophorectomy 06/16/2017 with benign pathology  (8) iron deficiency anemia secondary to menometrorrhagia, status post Feraheme 05/20/2017 and 06/01/2017  (9) likely thalassemia trait  (a) on 08/11/2017 hemoglobin was 12.7, MCV 78, and ferritin 188  (10) post traumatic stress disorder  (a) venlafaxine increased to 150 mg daily as of 02/25/2018    PLAN: Ricky is here today for follow-up of her history of breast cancer.  She has no clinical or radiographic sign of breast cancer recurrence.  She is overdue for her mammogram.  I have placed orders for this to be completed and she knows to have this done as soon as she gets a chance.  She is turning 45 tomorrow.  I wished her happy  birthday and I also sent in a referral for her to undergo colonoscopy.  I recommended that she continue her close follow-up with primary care and that she continue with healthy diet and exercise.  We will see her back in 6 months, in the meantime she will let us know if anything occurs and we need to see her sooner.   Total encounter time 20 minutes.*In face-to-face visit time, chart review, lab review, care coordination, order entry, and documentation of the encounter.   Wilber Bihari, NP 07/05/21 3:00 PM Medical Oncology and Hematology [facility] [place],  [place] [address] Tel. [phone]    Fax. [phone]   *Total Encounter Time as defined by the Centers for Medicare and Medicaid Services includes, in addition to the face-to-face time of a patient visit (documented in the note above) non-face-to-face time: obtaining and reviewing outside history, ordering and reviewing medications, tests or procedures, care coordination (communications with other health care professionals or caregivers) and documentation in the medical record.

## 2021-07-05 ENCOUNTER — Other Ambulatory Visit: Payer: Self-pay

## 2021-07-05 ENCOUNTER — Encounter: Payer: Self-pay | Admitting: Adult Health

## 2021-07-05 ENCOUNTER — Inpatient Hospital Stay (HOSPITAL_BASED_OUTPATIENT_CLINIC_OR_DEPARTMENT_OTHER): Payer: 59 | Admitting: Adult Health

## 2021-07-05 ENCOUNTER — Inpatient Hospital Stay: Payer: 59 | Attending: Adult Health

## 2021-07-05 VITALS — BP 165/90 | HR 83 | Temp 96.8°F | Resp 18 | Wt 329.7 lb

## 2021-07-05 DIAGNOSIS — Z86718 Personal history of other venous thrombosis and embolism: Secondary | ICD-10-CM | POA: Insufficient documentation

## 2021-07-05 DIAGNOSIS — Z923 Personal history of irradiation: Secondary | ICD-10-CM | POA: Insufficient documentation

## 2021-07-05 DIAGNOSIS — C50211 Malignant neoplasm of upper-inner quadrant of right female breast: Secondary | ICD-10-CM

## 2021-07-05 DIAGNOSIS — Z171 Estrogen receptor negative status [ER-]: Secondary | ICD-10-CM | POA: Diagnosis not present

## 2021-07-05 DIAGNOSIS — Z90722 Acquired absence of ovaries, bilateral: Secondary | ICD-10-CM | POA: Diagnosis not present

## 2021-07-05 DIAGNOSIS — Z1211 Encounter for screening for malignant neoplasm of colon: Secondary | ICD-10-CM | POA: Diagnosis not present

## 2021-07-05 DIAGNOSIS — Z87891 Personal history of nicotine dependence: Secondary | ICD-10-CM | POA: Diagnosis not present

## 2021-07-05 DIAGNOSIS — Z79899 Other long term (current) drug therapy: Secondary | ICD-10-CM | POA: Diagnosis not present

## 2021-07-05 DIAGNOSIS — Z9221 Personal history of antineoplastic chemotherapy: Secondary | ICD-10-CM | POA: Insufficient documentation

## 2021-07-05 LAB — CBC WITH DIFFERENTIAL (CANCER CENTER ONLY)
Abs Immature Granulocytes: 0.03 10*3/uL (ref 0.00–0.07)
Basophils Absolute: 0 10*3/uL (ref 0.0–0.1)
Basophils Relative: 1 %
Eosinophils Absolute: 0.2 10*3/uL (ref 0.0–0.5)
Eosinophils Relative: 3 %
HCT: 39.1 % (ref 36.0–46.0)
Hemoglobin: 12.3 g/dL (ref 12.0–15.0)
Immature Granulocytes: 1 %
Lymphocytes Relative: 27 %
Lymphs Abs: 1.5 10*3/uL (ref 0.7–4.0)
MCH: 26.1 pg (ref 26.0–34.0)
MCHC: 31.5 g/dL (ref 30.0–36.0)
MCV: 83 fL (ref 80.0–100.0)
Monocytes Absolute: 0.2 10*3/uL (ref 0.1–1.0)
Monocytes Relative: 4 %
Neutro Abs: 3.6 10*3/uL (ref 1.7–7.7)
Neutrophils Relative %: 64 %
Platelet Count: 257 10*3/uL (ref 150–400)
RBC: 4.71 MIL/uL (ref 3.87–5.11)
RDW: 14.3 % (ref 11.5–15.5)
WBC Count: 5.5 10*3/uL (ref 4.0–10.5)
nRBC: 0 % (ref 0.0–0.2)

## 2021-07-05 LAB — CMP (CANCER CENTER ONLY)
ALT: 27 U/L (ref 0–44)
AST: 16 U/L (ref 15–41)
Albumin: 3.9 g/dL (ref 3.5–5.0)
Alkaline Phosphatase: 114 U/L (ref 38–126)
Anion gap: 6 (ref 5–15)
BUN: 15 mg/dL (ref 6–20)
CO2: 28 mmol/L (ref 22–32)
Calcium: 9.4 mg/dL (ref 8.9–10.3)
Chloride: 104 mmol/L (ref 98–111)
Creatinine: 0.69 mg/dL (ref 0.44–1.00)
GFR, Estimated: >60 mL/min (ref >60)
Glucose, Bld: 157 mg/dL — ABNORMAL HIGH (ref 70–99)
Potassium: 4.1 mmol/L (ref 3.5–5.1)
Sodium: 138 mmol/L (ref 135–145)
Total Bilirubin: 0.3 mg/dL (ref 0.3–1.2)
Total Protein: 6.8 g/dL (ref 6.5–8.1)

## 2021-07-24 ENCOUNTER — Encounter: Payer: Self-pay | Admitting: Internal Medicine

## 2021-07-26 ENCOUNTER — Ambulatory Visit: Payer: 59 | Admitting: Internal Medicine

## 2021-09-14 ENCOUNTER — Other Ambulatory Visit (HOSPITAL_COMMUNITY): Payer: Self-pay | Admitting: Psychiatry

## 2021-09-14 DIAGNOSIS — F411 Generalized anxiety disorder: Secondary | ICD-10-CM

## 2021-09-14 DIAGNOSIS — F331 Major depressive disorder, recurrent, moderate: Secondary | ICD-10-CM

## 2021-09-16 ENCOUNTER — Other Ambulatory Visit (HOSPITAL_COMMUNITY): Payer: Self-pay

## 2021-09-16 ENCOUNTER — Telehealth: Payer: Self-pay

## 2021-09-16 NOTE — Telephone Encounter (Signed)
Patient Advocate Encounter ?  ?Received notification from [facility] that prior authorization for Trulicity '3mg'$ /0.85m pen injectors is required by his/her insurance [employer]. ?  ?PA submitted on 09/16/21 ? ?Key#: [id number]? ?Status is pending ?   ?[facility] will continue to follow: ? ?Patient Advocate ?Fax: [phone] ?

## 2021-09-18 ENCOUNTER — Other Ambulatory Visit (HOSPITAL_COMMUNITY): Payer: Self-pay

## 2021-09-19 ENCOUNTER — Other Ambulatory Visit (HOSPITAL_COMMUNITY): Payer: Self-pay

## 2021-09-19 NOTE — Telephone Encounter (Signed)
Patient Advocate Encounter ? ?Prior Authorization for Trulicity '3mg'$ /0.31m pen injectors has been approved.   ? ?PA# [id number]? ?Effective dates: 09/18/21 through 09/19/22 ? ?Per Test Claim Patients co-pay is $25 (2/28 days) ? ?Spoke with Pharmacy to Process. ? ?Patient Advocate ?Fax: [phone] ?

## 2021-10-03 ENCOUNTER — Other Ambulatory Visit: Payer: Self-pay | Admitting: Internal Medicine

## 2021-10-03 DIAGNOSIS — Z794 Long term (current) use of insulin: Secondary | ICD-10-CM

## 2021-10-04 ENCOUNTER — Other Ambulatory Visit (HOSPITAL_COMMUNITY): Payer: Self-pay

## 2021-10-04 MED ORDER — TRULICITY 3 MG/0.5ML ~~LOC~~ SOAJ: 3.0000 mg | SUBCUTANEOUS | 0 refills | Status: DC

## 2021-10-09 ENCOUNTER — Encounter: Payer: Self-pay | Admitting: Adult Health

## 2021-10-09 ENCOUNTER — Other Ambulatory Visit: Payer: Self-pay | Admitting: *Deleted

## 2021-10-09 ENCOUNTER — Telehealth: Payer: Self-pay

## 2021-10-09 DIAGNOSIS — N644 Mastodynia: Secondary | ICD-10-CM

## 2021-10-09 DIAGNOSIS — Z171 Estrogen receptor negative status [ER-]: Secondary | ICD-10-CM

## 2021-10-09 NOTE — Telephone Encounter (Signed)
err

## 2021-10-31 NOTE — Progress Notes (Signed)
? ?Complete physical exam ? ? ?Patient: Kelly Mendez   DOB: [birth year]   46 y.o. Female  MRN: [id number] ?Visit Date: 11/01/2021 ? ?Chief Complaint  ?Patient presents with  ? Annual Exam  ?  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 ?Subjective  ?  ?Kelly Mendez is a 46 y.o. female who presents today for a complete physical exam.  ? ?Reports is generally feeling fairly well; is eating a regular diet; states she has had  peripheral neuropathy since chemotherapy treatment in 2018 for her breast cancer and she thinks it's getting worse; denies ever going to Neurology for it, but is taking gabapentin; also is noticing more lymphedema with swelling and mild tenderness in her right inner upper arm, which is the side where she had a lumpectomy done; states she has mentioned it to her Surgeon in the past, but it's getting worse; states she does have follow up with surgeon scheduled; Also requests a refill of strattera because she states she has a hard time getting an appointment with [facility]. ? ?Last Ophthalmology exam - 2021  ? ? ?HPI ?HPI   ? ? Annual Exam   ? 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 ?  ?  ?Last edited by Deforest Hoyles, [place] on 11/01/2021  1:46 PM.  ?  ?  ? ?Past Medical History:  ?Diagnosis Date  ? Abnormal Pap smear of cervix   ? ADHD   ? Anemia   ? Anxiety   ? Asthma   ? Asymptomatic microscopic hematuria 09/29/2017  ? Back pain   ? Bilateral swelling of feet   ? Cancer [facility])   ? 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 Concussion   ? around age 68  ? Depression   ? Diabetes type 2, uncontrolled   ? new diagnosis in 08/2016  ? DVT (deep venous thrombosis) ([place]) 11/24/2016  ? in rt arm  ? Family history of breast cancer   ? Family history of neurofibromatosis   ? Family history of ovarian cancer   ? History of radiation therapy 04/21/17- 05/20/17  ? Right Breast  40.05 Gy in 15 fractions followed by a 10 Gy boost in 5 fractions to yield a total dose of 50.05 Gy  ? HPV in female   ? Hyperlipemia   ? Hypertension   ? Malignant neoplasm of upper-inner quadrant of right breast in female, estrogen receptor negative ([place]) 10/21/2016  ? Neuromuscular disorder ([place])   ? neuropathy  ? Pelvic mass in female 06/16/2017  ? Personal history of chemotherapy 2018  ? Personal history of radiation therapy 2018  ? Pneumonia   ? 2014   ? PTSD (post-traumatic stress disorder)   ? Sciatica   ? Sleep apnea   ? SOB (shortness of breath) on exertion   ? Vitamin D deficiency   ? ?Past Surgical History:  ?Procedure Laterality Date  ? BREAST BIOPSY Right 12/13/2019  ? BREAST LUMPECTOMY Right 03/18/2017  ? BREAST LUMPECTOMY WITH RADIOACTIVE SEED AND SENTINEL LYMPH NODE BIOPSY Right 03/18/2017  ? Procedure: RIGHT BREAST LUMPECTOMY WITH RADIOACTIVE SEED AND RIGHT SENTINEL LYMPH NODE BIOPSY;  Surgeon: [name], MD;  Location: [facility];  Service: General;  Laterality: Right;  ? COLPOSCOPY    ? left ovary removed  1978  ? was told her  ovary was removed but Dr. Denman George found that was not the case. Suspect this was originally an ovarian cystectomy  ? LUMBAR LAMINECTOMY/DECOMPRESSION MICRODISCECTOMY N/A 05/04/2020  ? Procedure: BILATERAL LUMBAR FIVE THROUGH SACRAL ONE MICRODISCECTOMY;  Surgeon: Jessy Oto, MD;  Location: [place];  Service: Orthopedics;  Laterality: N/A;  ? PORTACATH PLACEMENT Right 10/29/2016  ? Procedure: INSERTION PORT-A-CATH WITH [place];  Surgeon: [name], MD;  Location: [facility];  Service: General;  Laterality: Right;  ? portacath removal    ? oct. 5 2018  ? ROBOTIC ASSISTED TOTAL HYSTERECTOMY Bilateral 06/16/2017  ? Procedure: XI ROBOTIC ASSISTED TOTAL HYSTERECTOMY WITH BILATERAL SALPINGECTOMY,  OOPHERECTOMY;  Surgeon: [name], MD;  Location: [facility];  Service: Gynecology;  Laterality: Bilateral;  ? ?Social History  ? ?Socioeconomic History  ? Marital status:  Married  ?  Spouse name: Not on file  ? Number of children: Not on file  ? Years of education: Not on file  ? Highest education level: Not on file  ?Occupational History  ? Not on file  ?Tobacco Use  ? Smoking status: Former  ?  Packs/day: 0.15  ?  Years: 25.00  ?  Pack years: 3.75  ?  Types: Cigarettes  ?  Quit date: 11/25/2016  ?  Years since quitting: 4.9  ? Smokeless tobacco: Never  ?Vaping Use  ? Vaping Use: Never used  ?Substance and Sexual Activity  ? Alcohol use: Yes  ?  Alcohol/week: 1.0 standard drink  ?  Types: 1 Glasses of wine per week  ?  Comment: rarely   ? Drug use: No  ? Sexual activity: Not Currently  ?  Birth control/protection: Surgical  ?  Comment: husband vasectomy  ?Other Topics Concern  ? Not on file  ?Social History Narrative  ? Not on file  ? ?Social Determinants of Health  ? ?Financial Resource Strain: Not on file  ?Food Insecurity: Not on file  ?Transportation Needs: Not on file  ?Physical Activity: Not on file  ?Stress: Not on file  ?Social Connections: Not on file  ?Intimate Partner Violence: Not on file  ? ?Family Status  ?Relation Name Status  ? Mother  Alive  ? Father  Deceased  ? [place]  ? Mat Aunt Hassan Rowan Deceased  ? [place]  ? [place]  ? Mat [employer] Alive  ? Mat Aunt Cassie Alive  ? [place] Deceased  ? [place] Deceased  ? Annamarie Major  Alive  ? MGM  Alive  ? MGF  Deceased  ? PGM  Deceased  ? PGF  Alive  ? Cousin  (Not Specified)  ? Brother maternal 1/2 Alive  ? Brother paternal 1/2 Alive  ? Other x2 Deceased  ? Other  Deceased  ? ?Family History  ?Problem Relation Age of Onset  ? Breast cancer Mother 65  ? Depression Mother   ? Drug abuse Mother   ? Hepatitis C Father   ? Diabetes Father   ? Hypertension Father   ? Drug abuse Father   ? Obesity Father   ? Ovarian cancer Maternal Aunt   ?     dx in her 9s  ? Ovarian cancer Maternal Aunt   ?     dx in her 23s  ? Neurofibromatosis Maternal Aunt   ? Ovarian cancer Maternal Aunt   ?  Neurofibromatosis Maternal Aunt   ? Cervical cancer Maternal Aunt   ? Neurofibromatosis Maternal  Aunt   ? Cancer Maternal Aunt   ?     cancer on the bottom of her foot  ? Neurofibromatosis Maternal Uncle   ? Brain cancer Maternal Uncle 66  ? Lung cancer Maternal Grandfather   ? Kidney failure Paternal Grandmother   ? Heart attack Paternal Grandfather   ? Breast cancer Cousin   ? Breast cancer Other   ?     MGF's sisters  ? Neurofibromatosis Other   ?     MGM's paternal aunt  ? ?Allergies  ?Allergen Reactions  ? Lisinopril   ?  Cough, itchy throat  ? Penicillins Other (See Comments)  ?  As a child ?Has patient had a PCN reaction causing immediate rash, facial/tongue/throat swelling, SOB or lightheadedness with hypotension: unknown ?Has patient had a PCN reaction causing severe rash involving mucus membranes or skin necrosis: unknown ?Has patient had a PCN reaction that required hospitalization unknown ?Has patient had a PCN reaction occurring within the last 10 years: no ?If all of the above answers are "NO", then may proceed with Cephalosporin use. ?  ?  ?Patient Care Team: ?Marcellina Millin as PCP - General (Physician Assistant) ?[name], MD as Consulting Physician (General Surgery) ?Eppie Gibson, MD as Attending Physician (Radiation Oncology) ?Buena Irish, LCSW as Education officer, museum (Licensed Holiday representative) ?Gardenia Phlegm, NP as Nurse Practitioner (Hematology and Oncology) ?Shamleffer, Melanie Crazier, MD as Attending Physician (Endocrinology)  ? ?Medications: ?Outpatient Medications Prior to Visit  ?Medication Sig  ? cholecalciferol (VITAMIN D3) 25 MCG (1000 UNIT) tablet Take 1,000 Units by mouth daily.  ? Continuous Blood Gluc Sensor (DEXCOM G6 SENSOR) MISC 1 Device by Does not apply route as directed.  ? Continuous Blood Gluc Transmit (DEXCOM G6 TRANSMITTER) MISC Use as instructed to check blood sugar daily  ? Dulaglutide (TRULICITY) 3 TJ/0.3ES SOPN Inject 3 mg as directed once  a week.  ? glipiZIDE (GLUCOTROL) 5 MG tablet Take 1 tablet (5 mg total) by mouth daily before breakfast.  ? metFORMIN (GLUCOPHAGE-XR) 500 MG 24 hr tablet Take 2 tablets (1,000 mg total) by mouth 2 (two

## 2021-11-01 ENCOUNTER — Encounter: Payer: Self-pay | Admitting: Physician Assistant

## 2021-11-01 ENCOUNTER — Ambulatory Visit
Admission: RE | Admit: 2021-11-01 | Discharge: 2021-11-01 | Disposition: A | Discharge status: Home / Self Care | Payer: Medicaid Other | Source: Ambulatory Visit | Attending: Adult Health | Admitting: Adult Health

## 2021-11-01 ENCOUNTER — Ambulatory Visit
Admission: RE | Admit: 2021-11-01 | Discharge: 2021-11-01 | Disposition: A | Discharge status: Home / Self Care | Payer: 59 | Source: Ambulatory Visit | Attending: Adult Health | Admitting: Adult Health

## 2021-11-01 ENCOUNTER — Ambulatory Visit (INDEPENDENT_AMBULATORY_CARE_PROVIDER_SITE_OTHER): Payer: 59 | Admitting: Physician Assistant

## 2021-11-01 VITALS — BP 130/80 | HR 97 | Ht 72.0 in | Wt 335.2 lb

## 2021-11-01 DIAGNOSIS — E1169 Type 2 diabetes mellitus with other specified complication: Secondary | ICD-10-CM | POA: Diagnosis not present

## 2021-11-01 DIAGNOSIS — Z1159 Encounter for screening for other viral diseases: Secondary | ICD-10-CM

## 2021-11-01 DIAGNOSIS — R928 Other abnormal and inconclusive findings on diagnostic imaging of breast: Secondary | ICD-10-CM | POA: Diagnosis not present

## 2021-11-01 DIAGNOSIS — Z23 Encounter for immunization: Secondary | ICD-10-CM | POA: Diagnosis not present

## 2021-11-01 DIAGNOSIS — C50211 Malignant neoplasm of upper-inner quadrant of right female breast: Secondary | ICD-10-CM

## 2021-11-01 DIAGNOSIS — F900 Attention-deficit hyperactivity disorder, predominantly inattentive type: Secondary | ICD-10-CM

## 2021-11-01 DIAGNOSIS — I890 Lymphedema, not elsewhere classified: Secondary | ICD-10-CM | POA: Diagnosis not present

## 2021-11-01 DIAGNOSIS — N644 Mastodynia: Secondary | ICD-10-CM

## 2021-11-01 DIAGNOSIS — R69 Illness, unspecified: Secondary | ICD-10-CM | POA: Diagnosis not present

## 2021-11-01 DIAGNOSIS — Z171 Estrogen receptor negative status [ER-]: Secondary | ICD-10-CM

## 2021-11-01 DIAGNOSIS — Z0000 Encounter for general adult medical examination without abnormal findings: Secondary | ICD-10-CM

## 2021-11-01 DIAGNOSIS — F411 Generalized anxiety disorder: Secondary | ICD-10-CM

## 2021-11-01 DIAGNOSIS — E785 Hyperlipidemia, unspecified: Secondary | ICD-10-CM

## 2021-11-01 DIAGNOSIS — Z923 Personal history of irradiation: Secondary | ICD-10-CM | POA: Insufficient documentation

## 2021-11-01 DIAGNOSIS — G629 Polyneuropathy, unspecified: Secondary | ICD-10-CM

## 2021-11-01 DIAGNOSIS — Z6841 Body Mass Index (BMI) 40.0 and over, adult: Secondary | ICD-10-CM

## 2021-11-01 DIAGNOSIS — E1142 Type 2 diabetes mellitus with diabetic polyneuropathy: Secondary | ICD-10-CM | POA: Diagnosis not present

## 2021-11-01 DIAGNOSIS — Z9221 Personal history of antineoplastic chemotherapy: Secondary | ICD-10-CM | POA: Insufficient documentation

## 2021-11-01 MED ORDER — ATOMOXETINE HCL 40 MG PO CAPS: 40.0000 mg | ORAL_CAPSULE | Freq: Every day | ORAL | 5 refills | Status: DC

## 2021-11-01 MED ORDER — ATORVASTATIN CALCIUM 20 MG PO TABS: 20.0000 mg | ORAL_TABLET | Freq: Every day | ORAL | 1 refills | Status: DC

## 2021-11-01 MED ORDER — GABAPENTIN 100 MG PO CAPS: 200.0000 mg | ORAL_CAPSULE | Freq: Three times a day (TID) | ORAL | 1 refills | Status: DC

## 2021-11-01 NOTE — Assessment & Plan Note (Signed)
controlled, continue , eat a low fat diet, increase fiber intake (Benefiber or Metamucil, Cherrios,  oatmeal, beans, nuts, fruits and vegetables), limit saturated fats (in fried foods, red meat), can add OTC fish oil supplement, eat fish with Omega-3 fatty acids like salmon and tuna, exercise for 30 minutes 3 - 5 times a week, drink 8 - 10 glasses of water a day ? ? ?

## 2021-11-01 NOTE — Assessment & Plan Note (Addendum)
Controlled, followed by Endocrinology; eat a low sugar diet, avoid starchy food with a lot of carbohydrates, avoid fried and processed foods; last hgb a1c 7.7 on 04/22/2021; hgb a1c drawn today ? ?

## 2021-11-01 NOTE — Assessment & Plan Note (Signed)
>>  ASSESSMENT AND PLAN FOR TYPE 2 DIABETES MELLITUS WITH DIABETIC POLYNEUROPATHY, WITHOUT LONG-TERM CURRENT USE OF INSULIN WRITTEN ON 11/01/2021  4:16 PM BY Mayford Knife, LYNNE B, PA-C  Controlled, followed by Endocrinology; eat a low sugar diet, avoid starchy food with a lot of carbohydrates, avoid fried and processed foods; last hgb a1c 7.7 on 04/22/2021; hgb a1c drawn today

## 2021-11-01 NOTE — Patient Instructions (Addendum)
Preventative Care for Adults - Female ?   ?  MAINTAIN REGULAR HEALTH EXAMS: ?A routine yearly physical is a good way to check in with your primary care provider about your health and preventive screening. It is also an opportunity to share updates about your health and any concerns you have, and receive a thorough all-over exam.  ?Most health insurance companies pay for at least some preventative services.  Check with your health plan for specific coverages. ? ?WHAT PREVENTATIVE SERVICES DO WOMEN NEED? ?Adult women should have their weight and blood pressure checked regularly.  ?Women age 35 and older should have their cholesterol levels checked regularly. ?Women should be screened for cervical cancer with a Pap smear and pelvic exam beginning at either age 21, or 3 years after they become sexually activity.   ?Breast cancer screening generally begins at age 40 with a mammogram and breast exam by your primary care provider.   ?Beginning at age 45 and continuing to age 75, women should be screened for colorectal cancer.  Certain people may need continued testing until age 85. ?Updating vaccinations is part of preventative care.  Vaccinations help protect against diseases such as the flu. ?Osteoporosis is a disease in which the bones lose minerals and strength as we age. Women ages 65 and over should discuss this with their caregivers, as should women after menopause who have other risk factors. ?Lab tests are generally done as part of preventative care to screen for anemia and blood disorders, to screen for problems with the kidneys and liver, to screen for bladder problems, to check blood sugar, and to check your cholesterol level. ?Preventative services generally include counseling about diet, exercise, avoiding tobacco, drugs, excessive alcohol consumption, and sexually transmitted infections.   ? ?GENERAL RECOMMENDATIONS FOR GOOD HEALTH: ? ?Healthy diet: ?Eat a variety of foods, including fruit, vegetables,  animal or vegetable protein, such as meat, fish, chicken, and eggs, or beans, lentils, tofu, and grains, such as rice. ?Drink plenty of water daily (60 - 80 ounces or 8 - 10 glasses a day) ?Decrease saturated fat in the diet, avoid lots of red meat, processed foods, sweets, fast foods, and fried foods. For high cholesterol - Increase fiber intake (Benefiber or Metamucil, Cherrios,  oatmeal, beans, nuts, fruits and vegetables), limit saturated fats (in fried foods, red meat), can add OTC fish oil supplement, eat fish with Omega-3 fatty acids like salmon and tuna, exercise for 30 minutes 3 - 5 times a week, drink 8 - 10 glasses of water a day ? ?Exercise: ?Aerobic exercise helps maintain good heart health. At least 30-40 minutes of moderate-intensity exercise is recommended. For example, a brisk walk that increases your heart rate and breathing. This should be done on most days of the week.  ?Find a type of exercise or a variety of exercises that you enjoy so that it becomes a part of your daily life.  Examples are running, walking, swimming, water aerobics, and biking.  For motivation and support, explore group exercise such as aerobic class, spin class, Zumba, Yoga,or  martial arts, etc.   ?Set exercise goals for yourself, such as a certain weight goal, walk or run in a race such as a 5k walk/run.  Speak to your primary care provider about exercise goals. ? ?Disease prevention: ?If you smoke or chew tobacco, find out from your caregiver how to quit. It can literally save your life, no matter how long you have been a tobacco user. If you do not   use tobacco, never begin.  ?Maintain a healthy diet and normal weight. Increased weight leads to problems with blood pressure and diabetes.  ?The Body Mass Index or BMI is a way of measuring how much of your body is fat. Having a BMI above 27 increases the risk of heart disease, diabetes, hypertension, stroke and other problems related to obesity. Your caregiver can help  determine your BMI and based on it develop an exercise and dietary program to help you achieve or maintain this important measurement at a healthful level. ?High blood pressure causes heart and blood vessel problems.  Persistent high blood pressure should be treated with medicine if weight loss and exercise do not work.  ?Fat and cholesterol leaves deposits in your arteries that can block them. This causes heart disease and vessel disease elsewhere in your body.  If your cholesterol is found to be high, or if you have heart disease or certain other medical conditions, then you may need to have your cholesterol monitored frequently and be treated with medication.  ?Ask if you should have a cardiac stress test if your history suggests this. A stress test is a test done on a treadmill that looks for heart disease. This test can find disease prior to there being a problem. ?Menopause can be associated with physical symptoms and risks. Hormone replacement therapy is available to decrease these. You should talk to your caregiver about whether starting or continuing to take hormones is right for you.  ?Osteoporosis is a disease in which the bones lose minerals and strength as we age. This can result in serious bone fractures. Risk of osteoporosis can be identified using a bone density scan. Women ages 65 and over should discuss this with their caregivers, as should women after menopause who have other risk factors. Ask your caregiver whether you should be taking a calcium supplement and Vitamin D, to reduce the rate of osteoporosis.  ?Avoid drinking alcohol in excess (more than two drinks per day).  Avoid use of street drugs. Do not share needles with anyone. Ask for professional help if you need assistance or instructions on stopping the use of alcohol, cigarettes, and/or drugs. ?Brush your teeth twice a day with fluoride toothpaste, and floss once a day. Good oral hygiene prevents tooth decay and gum disease. The  problems can be painful, unattractive, and can cause other health problems. Visit your dentist for a routine oral and dental check up and preventive care every 6-12 months.  ?Look at your skin regularly.  Use a mirror to look at your back. Notify your caregivers of changes in moles, especially if there are changes in shapes, colors, a size larger than a pencil eraser, an irregular border, or development of new moles. ? ?Safety: ?Use seatbelts 100% of the time, whether driving or as a passenger.  Use safety devices such as hearing protection if you work in environments with loud noise or significant background noise.  Use safety glasses when doing any work that could send debris in to the eyes.  Use a helmet if you ride a bike or motorcycle.  Use appropriate safety gear for contact sports.  Talk to your caregiver about gun safety. ?Use sunscreen with a SPF (or skin protection factor) of 15 or greater.  Lighter skinned people are at a greater risk of skin cancer. Don?t forget to also wear sunglasses in order to protect your eyes from too much damaging sunlight. Damaging sunlight can accelerate cataract formation.  ?Practice safe sex. Use   condoms. Condoms are used for birth control and to help reduce the spread of sexually transmitted infections (or STIs).  Some of the STIs are gonorrhea (the clap), chlamydia, syphilis, trichomonas, herpes, HPV (human papilloma virus) and HIV (human immunodeficiency virus) which causes AIDS. The herpes, HIV and HPV are viral illnesses that have no cure. These can result in disability, cancer and death.  ?Keep carbon monoxide and smoke detectors in your home functioning at all times. Change the batteries every 6 months or use a model that plugs into the wall.  ? ?Vaccinations: ?Stay up to date with your tetanus shots and other required immunizations. You should have a booster for tetanus every 10 years. Be sure to get your flu shot every year, since 5%-20% of the [place] population comes  down with the flu. The flu vaccine changes each year, so being vaccinated once is not enough. Get your shot in the fall, before the flu season peaks.   ?Other vaccines to consider: ?Human Papilloma Virus or HPV causes

## 2021-11-02 LAB — COMPREHENSIVE METABOLIC PANEL
ALT: 18 IU/L (ref 0–32)
AST: 16 IU/L (ref 0–40)
Albumin/Globulin Ratio: 1.8 (ref 1.2–2.2)
Albumin: 4.2 g/dL (ref 3.8–4.8)
Alkaline Phosphatase: 127 IU/L — ABNORMAL HIGH (ref 44–121)
BUN/Creatinine Ratio: 15 (ref 9–23)
BUN: 13 mg/dL (ref 6–24)
Bilirubin Total: 0.2 mg/dL (ref 0.0–1.2)
CO2: 17 mmol/L — ABNORMAL LOW (ref 20–29)
Calcium: 10 mg/dL (ref 8.7–10.2)
Chloride: 106 mmol/L (ref 96–106)
Creatinine, Ser: 0.88 mg/dL (ref 0.57–1.00)
Globulin, Total: 2.4 g/dL (ref 1.5–4.5)
Glucose: 279 mg/dL — ABNORMAL HIGH (ref 70–99)
Potassium: 4.8 mmol/L (ref 3.5–5.2)
Sodium: 146 mmol/L — ABNORMAL HIGH (ref 134–144)
Total Protein: 6.6 g/dL (ref 6.0–8.5)
eGFR: 83 mL/min/{1.73_m2} (ref >59)

## 2021-11-02 LAB — LIPID PANEL
Chol/HDL Ratio: 4.3 ratio (ref 0.0–4.4)
Cholesterol, Total: 120 mg/dL (ref 100–199)
HDL: 28 mg/dL — ABNORMAL LOW (ref >39)
LDL Chol Calc (NIH): 65 mg/dL (ref 0–99)
Triglycerides: 156 mg/dL — ABNORMAL HIGH (ref 0–149)
VLDL Cholesterol Cal: 27 mg/dL (ref 5–40)

## 2021-11-02 LAB — HEMOGLOBIN A1C
Est. average glucose Bld gHb Est-mCnc: 229 mg/dL
Hgb A1c MFr Bld: 9.6 % — ABNORMAL HIGH (ref 4.8–5.6)

## 2021-11-02 LAB — HEPATITIS C ANTIBODY: Hep C Virus Ab: NONREACTIVE

## 2021-11-03 NOTE — Assessment & Plan Note (Signed)
Stable, follow up with oncology and breast surgeon as already scheduled ?

## 2021-11-03 NOTE — Assessment & Plan Note (Signed)
Stable, drink 8 - 10 glasses of water daily, limit sugar intake and eat a low fat diet, exercise 3 - 5 days a week, example walking 1 - 2 miles  ? ?

## 2021-11-03 NOTE — Assessment & Plan Note (Signed)
Continue gabapentin, control blood sugars, referral to Neurology ?

## 2021-11-03 NOTE — Assessment & Plan Note (Signed)
Stable, strattera refilled ?

## 2021-11-03 NOTE — Assessment & Plan Note (Signed)
Stable, will monitor 

## 2021-11-08 ENCOUNTER — Encounter: Payer: Self-pay | Admitting: Internal Medicine

## 2021-11-08 ENCOUNTER — Ambulatory Visit (INDEPENDENT_AMBULATORY_CARE_PROVIDER_SITE_OTHER): Payer: 59 | Admitting: Internal Medicine

## 2021-11-08 VITALS — BP 124/82 | HR 85 | Ht 74.0 in | Wt 335.8 lb

## 2021-11-08 DIAGNOSIS — E1165 Type 2 diabetes mellitus with hyperglycemia: Secondary | ICD-10-CM

## 2021-11-08 DIAGNOSIS — Z794 Long term (current) use of insulin: Secondary | ICD-10-CM | POA: Diagnosis not present

## 2021-11-08 DIAGNOSIS — E1142 Type 2 diabetes mellitus with diabetic polyneuropathy: Secondary | ICD-10-CM | POA: Diagnosis not present

## 2021-11-08 MED ORDER — LEVEMIR FLEXPEN 100 UNIT/ML ~~LOC~~ SOPN: 30.0000 [IU] | PEN_INJECTOR | Freq: Every day | SUBCUTANEOUS | 2 refills | Status: DC

## 2021-11-08 MED ORDER — TRULICITY 3 MG/0.5ML ~~LOC~~ SOAJ: 3.0000 mg | SUBCUTANEOUS | 3 refills | Status: DC

## 2021-11-08 MED ORDER — INSULIN PEN NEEDLE 29G X 5MM MISC: 1.0000 | Freq: Every day | 2 refills | Status: AC

## 2021-11-08 MED ORDER — METFORMIN HCL ER 500 MG PO TB24: 1000.0000 mg | ORAL_TABLET | Freq: Two times a day (BID) | ORAL | 3 refills | Status: DC

## 2021-11-08 NOTE — Patient Instructions (Addendum)
?-   Continue  Metformin 500 mg XR, 2 tablet with Breakfast and 2 tablet with supper  ?- Continue  trulicity 3 mg weekly  ?- Start Levemir 30 units once daily  ? ? ?HOW TO TREAT LOW BLOOD SUGARS (Blood sugar LESS THAN 70 MG/DL) ?Please follow the RULE OF 15 for the treatment of hypoglycemia treatment (when your (blood sugars are less than 70 mg/dL)  ? ?STEP 1: Take 15 grams of carbohydrates when your blood sugar is low, which includes:  ?3-4 GLUCOSE TABS  OR ?3-4 OZ OF JUICE OR REGULAR SODA OR ?ONE TUBE OF GLUCOSE GEL   ? ?STEP 2: RECHECK blood sugar in 15 MINUTES ?STEP 3: If your blood sugar is still low at the 15 minute recheck --> then, go back to STEP 1 and treat AGAIN with another 15 grams of carbohydrates. ? ?

## 2021-11-08 NOTE — Progress Notes (Signed)
? ? ? ?Name: JATZIRY WECHTER  ?Age/ Sex: 46 y.o., female   ?MRN/ DOB: [id number], February 23, 1976    ? ?PCP: Irene Pap, PA-C   ?Reason for Endocrinology Evaluation: Type 2 Diabetes Mellitus  ?   ?Initial Endocrinology Clinic Visit: 09/03/2018  ? ? ?PATIENT IDENTIFIER: Ms. CORIANNA AVALLONE is a 46 y.o. female with a past medical history of Right breast ca (03/19/2017 S/P lumpectomy, chemo and radiation) and T2DM. The patient has followed with Endocrinology clinic since 09/03/2018 for consultative assistance with management of her diabetes. ? ?DIABETIC HISTORY:  ?Ms. Zwart was diagnosed with T2DM in 2018. She has been on metformin since diagnosis, trulicity added in 3267 , she has not been on insulin. Her hemoglobin A1c has ranged from 6.7% in 2019, peaking at 8.8% in 2018.  ? ?Trulicity increase to 3 mg by the [facility] ? ? ?Glipizide started 04/2021 ? ? ?SUBJECTIVE:  ? ?During the last visit (04/22/2021): A1c 7.7 %. Continued  Metformin and  Trulicity ? ? ? ?Today (11/08/2021): Ms. Ewer is here for a follow up on her diabetes management.  She has not been checking her sugar  as her dexcom broke.  She patient has not had hypoglycemic episodes since the last clinic visit  ? ?She has not taken taken trulicity consistently since 12,2022 ?She stopped taking glipizide ~ 1 month ago due t weight gain ? ?She has a recent A1c at 9.6% pm 11/01/2021 ? ?Denies nausea, vomiting or diarrhea  ?She wants to take insulin  ?Pinky toes are almost numb - has a referral to neurology  ? ? ? ?HOME DIABETES REGIMEN:  ?Metformin 500 mg 2 tabs  BID   ?Trulicity 3  mg weekly- not taking  ?Glipizide 5 mg daily  ?  ? ?Statin: yes ?ACE-I/ARB: no ? ? ? ?CONTINUOUS GLUCOSE MONITORING RECORD INTERPRETATION Has not been using   ? ? ? ?DIABETIC COMPLICATIONS: ?Microvascular complications:  ?Neuropathy  ?Denies: CKD, retinopathy ?Last eye exam: Completed 05/2020 ?  ?Macrovascular complications:  ?  ?Denies: CAD, PVD,  CVA ? ? ? ? ?PHYSICAL EXAM: ?VS: BP 124/82   Pulse 85   Ht '6\' 2"'$  (1.88 m)   Wt (!) 335 lb 12.8 oz (152.3 kg)   LMP 01/10/2017   SpO2 98%   BMI 43.11 kg/m?   ? ?EXAM: ?General: Pt appears well and is in NAD  ?Lungs: Clear with good BS bilat with no rales, rhonchi, or wheezes  ?Abdomen: Normoactive bowel sounds, soft, nontender, without masses or organomegaly palpable  ?Extremities:  ?BL LE: no pretibial edema normal   ?Mental Status: Judgment, insight: intact ?Mood and affect: no depression, anxiety, or agitation  ? ? ? ?DM Foot exam 11/08/2021 ?The skin of the feet is without sores or ulcerations, right 1st MTH callous formation  ?The pedal pulses are 2+ on right and 2+ on left. ?The sensation is decreased   to a screening 5.07, 10 gram monofilament bilaterally  ? ? ?  ? ? ?HISTORY:  ?Past Medical History:  ?Past Medical History:  ?Diagnosis Date  ? Abnormal Pap smear of cervix   ? ADHD   ? Anemia   ? Anxiety   ? Asthma   ? Asymptomatic microscopic hematuria 09/29/2017  ? Back pain   ? Bilateral swelling of feet   ? Cancer [facility])   ? 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 Concussion   ? around age 81  ?  Depression   ? Diabetes type 2, uncontrolled   ? new diagnosis in 08/2016  ? DVT (deep venous thrombosis) ([place]) 11/24/2016  ? in rt arm  ? Family history of breast cancer   ? Family history of neurofibromatosis   ? Family history of ovarian cancer   ? History of radiation therapy 04/21/17- 05/20/17  ? Right Breast 40.05 Gy in 15 fractions followed by a 10 Gy boost in 5 fractions to yield a total dose of 50.05 Gy  ? HPV in female   ? Hyperlipemia   ? Hypertension   ? Malignant neoplasm of upper-inner quadrant of right breast in female, estrogen receptor negative ([place]) 10/21/2016  ? Neuromuscular disorder ([place])   ? neuropathy  ? Pelvic mass in female 06/16/2017  ? Personal history of chemotherapy 2018  ? Personal history of radiation therapy 2018  ? Pneumonia   ? 2014   ? PTSD (post-traumatic stress  disorder)   ? Sciatica   ? Sleep apnea   ? SOB (shortness of breath) on exertion   ? Vitamin D deficiency   ? ?Past Surgical History:  ?Past Surgical History:  ?Procedure Laterality Date  ? BREAST BIOPSY Right 12/13/2019  ? BREAST LUMPECTOMY Right 03/18/2017  ? BREAST LUMPECTOMY WITH RADIOACTIVE SEED AND SENTINEL LYMPH NODE BIOPSY Right 03/18/2017  ? Procedure: RIGHT BREAST LUMPECTOMY WITH RADIOACTIVE SEED AND RIGHT SENTINEL LYMPH NODE BIOPSY;  Surgeon: [name], MD;  Location: [place];  Service: General;  Laterality: Right;  ? COLPOSCOPY    ? left ovary removed  1978  ? was told her ovary was removed but Dr. Denman George found that was not the case. Suspect this was originally an ovarian cystectomy  ? LUMBAR LAMINECTOMY/DECOMPRESSION MICRODISCECTOMY N/A 05/04/2020  ? Procedure: BILATERAL LUMBAR FIVE THROUGH SACRAL ONE MICRODISCECTOMY;  Surgeon: Jessy Oto, MD;  Location: [place];  Service: Orthopedics;  Laterality: N/A;  ? PORTACATH PLACEMENT Right 10/29/2016  ? Procedure: INSERTION PORT-A-CATH WITH [place];  Surgeon: [name], MD;  Location: [place];  Service: General;  Laterality: Right;  ? portacath removal    ? oct. 5 2018  ? ROBOTIC ASSISTED TOTAL HYSTERECTOMY Bilateral 06/16/2017  ? Procedure: XI ROBOTIC ASSISTED TOTAL HYSTERECTOMY WITH BILATERAL SALPINGECTOMY,  OOPHERECTOMY;  Surgeon: [name], MD;  Location[facility];  Service: Gynecology;  Laterality: Bilateral;  ? ?Social History:  reports that she quit smoking about 4 years ago. Her smoking use included cigarettes. She has a 3.75 pack-year smoking history. She has never used smokeless tobacco. She reports current alcohol use of about 1.0 standard drink per week. She reports that she does not use drugs. ?Family History:  ?Family History  ?Problem Relation Age of Onset  ? Breast cancer Mother 11  ? Depression Mother   ? Drug abuse Mother   ? Hepatitis C Father   ? Diabetes Father   ? Hypertension Father   ? Drug abuse Father   ? Obesity  Father   ? Ovarian cancer Maternal Aunt   ?     dx in her 48s  ? Ovarian cancer Maternal Aunt   ?     dx in her 47s  ? Neurofibromatosis Maternal Aunt   ? Ovarian cancer Maternal Aunt   ? Neurofibromatosis Maternal Aunt   ? Cervical cancer Maternal Aunt   ? Neurofibromatosis Maternal Aunt   ? Cancer Maternal Aunt   ?     cancer on the bottom of her foot  ? Neurofibromatosis Maternal Uncle   ?  Brain cancer Maternal Uncle 45  ? Lung cancer Maternal Grandfather   ? Kidney failure Paternal Grandmother   ? Heart attack Paternal Grandfather   ? Breast cancer Cousin   ? Breast cancer Other   ?     MGF's sisters  ? Neurofibromatosis Other   ?     MGM's paternal aunt  ? ? ? ?HOME MEDICATIONS: ?Allergies as of 11/08/2021   ? ?   Reactions  ? Lisinopril   ? Cough, itchy throat  ? Penicillins Other (See Comments)  ? As a child ?Has patient had a PCN reaction causing immediate rash, facial/tongue/throat swelling, SOB or lightheadedness with hypotension: unknown ?Has patient had a PCN reaction causing severe rash involving mucus membranes or skin necrosis: unknown ?Has patient had a PCN reaction that required hospitalization unknown ?Has patient had a PCN reaction occurring within the last 10 years: no ?If all of the above answers are "NO", then may proceed with Cephalosporin use.  ? ?  ? ?  ?Medication List  ?  ? ?  ? Accurate as of November 08, 2021 11:48 AM. If you have any questions, ask your nurse or doctor.  ?  ?  ? ?  ? ?atomoxetine 40 MG capsule ?Commonly known as: STRATTERA ?Take 1 capsule (40 mg total) by mouth daily. ?  ?atorvastatin 20 MG tablet ?Commonly known as: LIPITOR ?Take 1 tablet (20 mg total) by mouth daily. ?  ?cholecalciferol 25 MCG (1000 UNIT) tablet ?Commonly known as: VITAMIN D3 ?Take 1,000 Units by mouth daily. ?  ?Dexcom G6 Sensor Misc ?1 Device by Does not apply route as directed. ?  ?Dexcom G6 Transmitter Misc ?Use as instructed to check blood sugar daily ?  ?gabapentin 100 MG capsule ?Commonly known as:  Neurontin ?Take 2 capsules (200 mg total) by mouth 3 (three) times daily. ?  ?glipiZIDE 5 MG tablet ?Commonly known as: GLUCOTROL ?Take 1 tablet (5 mg total) by mouth daily before breakfast. ?  ?metFORMIN 500 MG 24 hr tablet ?Com

## 2021-11-14 ENCOUNTER — Encounter: Payer: Self-pay | Admitting: Gastroenterology

## 2021-11-27 ENCOUNTER — Telehealth: Payer: Self-pay | Admitting: Adult Health

## 2021-11-27 ENCOUNTER — Encounter: Payer: Self-pay | Admitting: Physician Assistant

## 2021-11-27 NOTE — Telephone Encounter (Signed)
Rescheduled appointment per provider template. Left message. ?

## 2021-11-29 ENCOUNTER — Other Ambulatory Visit: Payer: Self-pay | Admitting: Physician Assistant

## 2021-11-29 ENCOUNTER — Ambulatory Visit (AMBULATORY_SURGERY_CENTER): Payer: Self-pay | Admitting: *Deleted

## 2021-11-29 VITALS — Ht 74.0 in | Wt 332.0 lb

## 2021-11-29 DIAGNOSIS — Z1211 Encounter for screening for malignant neoplasm of colon: Secondary | ICD-10-CM

## 2021-11-29 MED ORDER — NA SULFATE-K SULFATE-MG SULF 17.5-3.13-1.6 GM/177ML PO SOLN: 1.0000 | ORAL | 0 refills | Status: DC

## 2021-11-29 NOTE — Progress Notes (Signed)
Patient's pre-visit was done today over the phone with the patient. Name,DOB and address verified. Patient denies any allergies to Eggs and Soy. Patient denies any problems with anesthesia/sedation. Patient is not taking any diet pills or blood thinners. No home Oxygen. Insurance confirmed with patient. ? ?Prep instructions sent to pt's MyChart-pt is aware. Patient understands to call us back with any questions or concerns. Patient is aware of our care-partner policy.  ? ?EMMI education assigned to the patient for the procedure, sent to MyChart.  ? ?The patient is COVID-19 vaccinated.   ?

## 2021-11-29 NOTE — Telephone Encounter (Signed)
Note written. Please call her to let her know and email it to her. I'm not sure about the privacy rules about [place] emailing it to her employer.  Thanks.

## 2021-12-25 ENCOUNTER — Encounter: Payer: Self-pay | Admitting: Gastroenterology

## 2021-12-27 ENCOUNTER — Ambulatory Visit (AMBULATORY_SURGERY_CENTER): Payer: 59 | Admitting: Gastroenterology

## 2021-12-27 ENCOUNTER — Other Ambulatory Visit: Payer: Self-pay | Admitting: Physician Assistant

## 2021-12-27 ENCOUNTER — Encounter: Payer: Self-pay | Admitting: Gastroenterology

## 2021-12-27 VITALS — BP 151/91 | HR 80 | Temp 97.7°F | Resp 13 | Ht 74.0 in | Wt 332.0 lb

## 2021-12-27 DIAGNOSIS — E785 Hyperlipidemia, unspecified: Secondary | ICD-10-CM | POA: Diagnosis not present

## 2021-12-27 DIAGNOSIS — Z1211 Encounter for screening for malignant neoplasm of colon: Secondary | ICD-10-CM

## 2021-12-27 DIAGNOSIS — I10 Essential (primary) hypertension: Secondary | ICD-10-CM | POA: Diagnosis not present

## 2021-12-27 DIAGNOSIS — R69 Illness, unspecified: Secondary | ICD-10-CM | POA: Diagnosis not present

## 2021-12-27 DIAGNOSIS — E1165 Type 2 diabetes mellitus with hyperglycemia: Secondary | ICD-10-CM | POA: Diagnosis not present

## 2021-12-27 MED ORDER — SODIUM CHLORIDE 0.9 % IV SOLN: 500.0000 mL | Freq: Once | INTRAVENOUS | Status: DC

## 2021-12-27 NOTE — Op Note (Signed)
[place] Patient Name: Kelly Mendez Procedure Date: 12/27/2021 9:10 AM MRN: [id number] Endoscopist: Mallie Mussel L. Loletha Carrow , MD Age: 46 Referring MD:  Date of Birth: [birth year] Gender: Female Account #: [id number] Procedure:                Colonoscopy Indications:              Screening for colorectal malignant neoplasm, This                            is the patient's first colonoscopy Medicines:                Monitored Anesthesia Care Procedure:                Pre-Anesthesia Assessment:                           - Prior to the procedure, a History and Physical                            was performed, and patient medications and                            allergies were reviewed. The patient's tolerance of                            previous anesthesia was also reviewed. The risks                            and benefits of the procedure and the sedation                            options and risks were discussed with the patient.                            All questions were answered, and informed consent                            was obtained. Prior Anticoagulants: The patient has                            taken no previous anticoagulant or antiplatelet                            agents. ASA Grade Assessment: III - A patient with                            severe systemic disease. After reviewing the risks                            and benefits, the patient was deemed in                            satisfactory condition to undergo the procedure.  After obtaining informed consent, the colonoscope                            was passed under direct vision. Throughout the                            procedure, the patient's blood pressure, pulse, and                            oxygen saturations were monitored continuously. The                            Olympus CF-HQ190L ([id number]) Colonoscope was                            introduced through the anus  and advanced to the the                            cecum, identified by appendiceal orifice and                            ileocecal valve. The colonoscopy was extremely                            difficult due to a redundant colon, significant                            looping, abdominal breathing, coughing and                            challenging apnea-related respiratory management as                            well as the patient's body habitus. Successful                            completion of the procedure was aided by using                            manual pressure and straightening and shortening                            the scope to obtain bowel loop reduction. The                            patient tolerated the procedure poorly. The quality                            of the bowel preparation was good. The ileocecal                            valve, appendiceal orifice, and rectum were  photographed. Scope In: 9:27:24 AM Scope Out: 9:48:45 AM Scope Withdrawal Time: 0 hours 9 minutes 14 seconds  Total Procedure Duration: 0 hours 21 minutes 21 seconds  Findings:                 The perianal and digital rectal examinations were                            normal.                           The colon (entire examined portion) was redundant.                           A few diverticula were found in the left colon.                           The exam was otherwise without abnormality on                            direct and retroflexion views. Complications:            No immediate complications. Estimated Blood Loss:     Estimated blood loss: none. Impression:               - Redundant colon.                           - Diverticulosis in the left colon.                           - The examination was otherwise normal on direct                            and retroflexion views.                           - No specimens collected. Recommendation:            - Patient has a contact number available for                            emergencies. The signs and symptoms of potential                            delayed complications were discussed with the                            patient. Return to normal activities tomorrow.                            Written discharge instructions were provided to the                            patient.                           - Resume previous diet.                           -  Continue present medications.                           - Repeat colon cancer screening in 10 years.Recall                            patient at that time - if medical issues,                            especially BMI and OSA, remain similar to now,                            cologuard should be considered given the                            difficulties noted above. Yahye Siebert L. Loletha Carrow, MD 12/27/2021 9:54:22 AM This report has been signed electronically.

## 2021-12-27 NOTE — Progress Notes (Signed)
Pt's states no medical or surgical changes since previsit or office visit. 

## 2021-12-27 NOTE — Progress Notes (Signed)
History and Physical:  This patient presents for endoscopic testing for: Encounter Diagnosis  Name Primary?   Special screening for malignant neoplasms, colon Yes    Average risk for CRC - first screening exam Patient denies chronic abdominal pain, rectal bleeding, constipation or diarrhea.   Patient is otherwise without complaints or active issues today.   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   Concussion    around age 30   Depression    Diabetes type 2, uncontrolled    new diagnosis in 08/2016   DVT (deep venous thrombosis) ([place]) 11/24/2016   in rt arm   Family history of breast cancer    Family history of neurofibromatosis    Family history of ovarian cancer    History of radiation therapy 04/21/17- 05/20/17   Right Breast 40.05 Gy in 15 fractions followed by a 10 Gy boost in 5 fractions to yield a total dose of 50.05 Gy   HPV in female    Hyperlipemia    Hypertension    Malignant neoplasm of upper-inner quadrant of right breast in female, estrogen receptor negative ([place]) 10/21/2016   Neuromuscular disorder ([place])    neuropathy   Pelvic mass in female 06/16/2017   Personal history of chemotherapy 2018   Personal history of radiation therapy 2018   Pneumonia    2014    PTSD (post-traumatic stress disorder)    Sciatica    Sleep apnea    SOB (shortness of breath) on exertion    Vitamin D deficiency      Past Surgical History: Past Surgical History:  Procedure Laterality Date   BREAST BIOPSY Right 12/13/2019   BREAST LUMPECTOMY Right 03/18/2017   BREAST LUMPECTOMY WITH RADIOACTIVE SEED AND SENTINEL LYMPH NODE BIOPSY Right 03/18/2017   Procedure: RIGHT BREAST LUMPECTOMY WITH RADIOACTIVE SEED AND RIGHT SENTINEL  LYMPH NODE BIOPSY;  Surgeon: [name], MD;  Location: [place];  Service: General;  Laterality: Right;   COLPOSCOPY     left ovary removed  1978   was told her ovary was removed but Dr. Denman George found that was not the case. Suspect this was originally an ovarian cystectomy   LUMBAR LAMINECTOMY/DECOMPRESSION MICRODISCECTOMY N/A 05/04/2020   Procedure: BILATERAL LUMBAR FIVE THROUGH SACRAL ONE MICRODISCECTOMY;  Surgeon: Jessy Oto, MD;  Location: [place];  Service: Orthopedics;  Laterality: N/A;   PORTACATH PLACEMENT Right 10/29/2016   Procedure: INSERTION PORT-A-CATH WITH [place];  Surgeon: [name], MD;  Location: [place];  Service: General;  Laterality: Right;   portacath removal     oct. 5 2018   ROBOTIC ASSISTED TOTAL HYSTERECTOMY Bilateral 06/16/2017   Procedure: XI ROBOTIC ASSISTED TOTAL HYSTERECTOMY WITH BILATERAL SALPINGECTOMY,  OOPHERECTOMY;  Surgeon: [name], MD;  [facility];  Service: Gynecology;  Laterality: Bilateral;    Allergies: Allergies  Allergen Reactions   Lisinopril     Cough, itchy throat   Penicillins Other (See Comments)    As a child Has patient had a PCN reaction causing immediate rash, facial/tongue/throat swelling, SOB or lightheadedness with hypotension: unknown Has patient had a PCN reaction causing severe rash involving mucus membranes  or skin necrosis: unknown Has patient had a PCN reaction that required hospitalization unknown Has patient had a PCN reaction occurring within the last 10 years: no If all of the above answers are "NO", then may proceed with Cephalosporin use.     Outpatient Meds: Current Outpatient Medications  Medication Sig Dispense Refill   atomoxetine (STRATTERA) 40 MG capsule Take 1 capsule (40 mg total) by mouth daily. 30 capsule 5   atorvastatin (LIPITOR) 20 MG tablet Take 1 tablet (20 mg total) by mouth daily. 90 tablet 1   cholecalciferol (VITAMIN D3) 25 MCG (1000 UNIT) tablet Take 1,000 Units by  mouth daily.     Continuous Blood Gluc Transmit (DEXCOM G6 TRANSMITTER) MISC Use as instructed to check blood sugar daily 1 each 2   Dulaglutide (TRULICITY) 3 TJ/0.3ES SOPN Inject 3 mg as directed once a week. (Patient taking differently: Inject 3 mg as directed once a week. Saturday) 6 mL 3   gabapentin (NEURONTIN) 100 MG capsule Take 2 capsules (200 mg total) by mouth 3 (three) times daily. 180 capsule 1   insulin detemir (LEVEMIR FLEXPEN) 100 UNIT/ML FlexPen Inject 30 Units into the skin daily. (Patient taking differently: Inject 30 Units into the skin at bedtime.) 30 mL 2   metFORMIN (GLUCOPHAGE-XR) 500 MG 24 hr tablet Take 2 tablets (1,000 mg total) by mouth 2 (two) times daily with a meal. 360 tablet 3   Continuous Blood Gluc Sensor (DEXCOM G6 SENSOR) MISC 1 Device by Does not apply route as directed. 9 each 1   Insulin Pen Needle 29G X 5MM MISC 1 Device by Does not apply route daily in the afternoon. 100 each 2   Current Facility-Administered Medications  Medication Dose Route Frequency Provider Last Rate Last Admin   0.9 %  sodium chloride infusion  500 mL Intravenous Once Nelida Meuse III, MD          ___________________________________________________________________ Objective   Exam:  BP (!) 153/85   Pulse 72   Temp 97.7 F (36.5 C)   Resp 14   Ht '6\' 2"'$  (1.88 m)   Wt (!) 332 lb (150.6 kg)   LMP 01/10/2017   SpO2 100%   BMI 42.63 kg/m   CV: RRR without murmur, S1/S2 Resp: clear to auscultation bilaterally, normal RR and effort noted GI: soft, no tenderness, with active bowel sounds.   Assessment: Encounter Diagnosis  Name Primary?   Special screening for malignant neoplasms, colon Yes     Plan: Colonoscopy  The benefits and risks of the planned procedure were described in detail with the patient or (when appropriate) their health care proxy.  Risks were outlined as including, but not limited to, bleeding, infection, perforation, adverse medication reaction  leading to cardiac or pulmonary decompensation, pancreatitis (if ERCP).  The limitation of incomplete mucosal visualization was also discussed.  No guarantees or warranties were given.    The patient is appropriate for an endoscopic procedure in the ambulatory setting.   - Wilfrid Lund, MD

## 2021-12-27 NOTE — Patient Instructions (Addendum)
Handout was given to your care partner on Diverticulosis. Your sugar was 171 in the recovery room.  Resume your diabetes. You may resume your other current medications today. Repeat colonoscopy screening in 10 years.   Please call if any questions or concerns.     YOU HAD AN ENDOSCOPIC PROCEDURE TODAY AT [facility]:   Refer to the procedure report that was given to you for any specific questions about what was found during the examination.  If the procedure report does not answer your questions, please call your gastroenterologist to clarify.  If you requested that your care partner not be given the details of your procedure findings, then the procedure report has been included in a sealed envelope for you to review at your convenience later.  YOU SHOULD EXPECT: Some feelings of bloating in the abdomen. Passage of more gas than usual.  Walking can help get rid of the air that was put into your GI tract during the procedure and reduce the bloating. If you had a lower endoscopy (such as a colonoscopy or flexible sigmoidoscopy) you may notice spotting of blood in your stool or on the toilet paper. If you underwent a bowel prep for your procedure, you may not have a normal bowel movement for a few days.  Please Note:  You might notice some irritation and congestion in your nose or some drainage.  This is from the oxygen used during your procedure.  There is no need for concern and it should clear up in a day or so.  SYMPTOMS TO REPORT IMMEDIATELY:  Following lower endoscopy (colonoscopy or flexible sigmoidoscopy):  Excessive amounts of blood in the stool  Significant tenderness or worsening of abdominal pains  Swelling of the abdomen that is new, acute  Fever of 100F or higher   For urgent or emergent issues, a gastroenterologist can be reached at any hour by calling [phone]. Do not use MyChart messaging for urgent concerns.    DIET:  We do recommend a [place] meal at  first, but then you may proceed to your regular diet.  Drink plenty of fluids but you should avoid alcoholic beverages for 24 hours.  ACTIVITY:  You should plan to take it easy for the rest of today and you should NOT DRIVE or use heavy machinery until tomorrow (because of the sedation medicines used during the test).    FOLLOW UP: Our staff will call the number listed on your records 24-72 hours following your procedure to check on you and address any questions or concerns that you may have regarding the information given to you following your procedure. If we do not reach you, we will leave a message.  We will attempt to reach you two times.  During this call, we will ask if you have developed any symptoms of COVID 19. If you develop any symptoms (ie: fever, flu-like symptoms, shortness of breath, cough etc.) before then, please call [phone].  If you test positive for Covid 19 in the 2 weeks post procedure, please call and report this information to [place].    If any biopsies were taken you will be contacted by phone or by letter within the next 1-3 weeks.  Please call us at [phone] if you have not heard about the biopsies in 3 weeks.    SIGNATURES/CONFIDENTIALITY: You and/or your care partner have signed paperwork which will be entered into your electronic medical record.  These signatures attest to the fact that that the  information above on your After Visit Summary has been reviewed and is understood.  Full responsibility of the confidentiality of this discharge information lies with you and/or your care-partner.

## 2021-12-27 NOTE — Progress Notes (Signed)
Vss nad trans to pacu °

## 2021-12-27 NOTE — Telephone Encounter (Signed)
Pt is requesting refill on Gabapentin sent to [facility] on [address].

## 2021-12-27 NOTE — Progress Notes (Signed)
No problems noted in the recovery room. maw 

## 2021-12-30 ENCOUNTER — Other Ambulatory Visit: Payer: Self-pay | Admitting: Physician Assistant

## 2021-12-30 MED ORDER — GABAPENTIN 100 MG PO CAPS: 200.0000 mg | ORAL_CAPSULE | Freq: Three times a day (TID) | ORAL | 0 refills | Status: DC

## 2022-01-06 ENCOUNTER — Telehealth: Payer: Self-pay

## 2022-01-06 ENCOUNTER — Telehealth: Payer: 59 | Admitting: Physician Assistant

## 2022-01-06 ENCOUNTER — Inpatient Hospital Stay: Payer: 59 | Attending: Adult Health | Admitting: Adult Health

## 2022-01-06 DIAGNOSIS — G8929 Other chronic pain: Secondary | ICD-10-CM | POA: Diagnosis not present

## 2022-01-06 DIAGNOSIS — M5441 Lumbago with sciatica, right side: Secondary | ICD-10-CM | POA: Diagnosis not present

## 2022-01-06 MED ORDER — BACLOFEN 10 MG PO TABS: 10.0000 mg | ORAL_TABLET | Freq: Three times a day (TID) | ORAL | 0 refills | Status: DC

## 2022-01-06 MED ORDER — DICLOFENAC SODIUM 75 MG PO TBEC: 75.0000 mg | DELAYED_RELEASE_TABLET | Freq: Two times a day (BID) | ORAL | 0 refills | Status: DC

## 2022-01-06 NOTE — Progress Notes (Signed)
We are sorry that you are not feeling well.  Here is how we plan to help!  Based on what you have shared with me it looks like you mostly have acute back pain.  Acute back pain is defined as musculoskeletal pain that can resolve in 1-3 weeks with conservative treatment.  I have prescribed Diclofenac 75 mg tablet, a non-steroid anti-inflammatory (NSAID) as well as Baclofen 10 mg every eight hours as needed which is a muscle relaxer  Some patients experience stomach irritation or in increased heartburn with anti-inflammatory drugs.  Please keep in mind that muscle relaxer's can cause fatigue and should not be taken while at work or driving.  Back pain is very common.  The pain often gets better over time.  The cause of back pain is usually not dangerous.  Most people can learn to manage their back pain on their own.  Home Care Stay active.  Start with short walks on flat ground if you can.  Try to walk farther each day. Do not sit, drive or stand in one place for more than 30 minutes.  Do not stay in bed. Do not avoid exercise or work.  Activity can help your back heal faster. Be careful when you bend or lift an object.  Bend at your knees, keep the object close to you, and do not twist. Sleep on a firm mattress.  Lie on your side, and bend your knees.  If you lie on your back, put a pillow under your knees. Only take medicines as told by your doctor. Put ice on the injured area. Put ice in a plastic bag Place a towel between your skin and the bag Leave the ice on for 15-20 minutes, 3-4 times a day for the first 2-3 days. 210 After that, you can switch between ice and heat packs. Ask your doctor about back exercises or massage. Avoid feeling anxious or stressed.  Find good ways to deal with stress, such as exercise.  Get Help Right Way If: Your pain does not go away with rest or medicine. Your pain does not go away in 1 week. You have new problems. You do not feel well. The pain spreads into  your legs. You cannot control when you poop (bowel movement) or pee (urinate) You feel sick to your stomach (nauseous) or throw up (vomit) You have belly (abdominal) pain. You feel like you may pass out (faint). If you develop a fever.  Make Sure you: Understand these instructions. Will watch your condition Will get help right away if you are not doing well or get worse.  Your e-visit answers were reviewed by a board certified advanced clinical practitioner to complete your personal care plan.  Depending on the condition, your plan could have included both over the counter or prescription medications.  If there is a problem please reply  once you have received a response from your provider.  Your safety is important to [place].  If you have drug allergies check your prescription carefully.    You can use MyChart to ask questions about today's visit, request a non-urgent call back, or ask for a work or school excuse for 24 hours related to this e-Visit. If it has been greater than 24 hours you will need to follow up with your provider, or enter a new e-Visit to address those concerns.  You will get an e-mail in the next two days asking about your experience.  I hope that your e-visit has been valuable  and will speed your recovery. Thank you for using e-visits.  I provided 5 minutes of non face-to-face time during this encounter for chart review and documentation.

## 2022-01-10 ENCOUNTER — Encounter: Payer: Self-pay | Admitting: Internal Medicine

## 2022-02-19 ENCOUNTER — Encounter (INDEPENDENT_AMBULATORY_CARE_PROVIDER_SITE_OTHER): Payer: Self-pay

## 2022-03-13 ENCOUNTER — Ambulatory Visit: Payer: 59 | Admitting: Internal Medicine

## 2022-03-14 ENCOUNTER — Ambulatory Visit: Payer: 59 | Admitting: Internal Medicine

## 2022-03-19 ENCOUNTER — Encounter: Payer: Self-pay | Admitting: Internal Medicine

## 2022-04-04 ENCOUNTER — Other Ambulatory Visit: Payer: Self-pay

## 2022-04-04 ENCOUNTER — Encounter: Payer: Self-pay | Admitting: Internal Medicine

## 2022-04-04 DIAGNOSIS — Z794 Long term (current) use of insulin: Secondary | ICD-10-CM

## 2022-04-04 MED ORDER — TRULICITY 3 MG/0.5ML ~~LOC~~ SOAJ: 3.0000 mg | SUBCUTANEOUS | 3 refills | Status: DC

## 2022-04-04 MED ORDER — LEVEMIR FLEXPEN 100 UNIT/ML ~~LOC~~ SOPN: 30.0000 [IU] | PEN_INJECTOR | Freq: Every day | SUBCUTANEOUS | 1 refills | Status: DC

## 2022-04-04 MED ORDER — METFORMIN HCL ER 500 MG PO TB24: 1000.0000 mg | ORAL_TABLET | Freq: Two times a day (BID) | ORAL | 1 refills | Status: AC

## 2022-04-05 ENCOUNTER — Other Ambulatory Visit: Payer: Self-pay | Admitting: Medical

## 2022-04-05 MED ORDER — GABAPENTIN 100 MG PO CAPS: 200.0000 mg | ORAL_CAPSULE | Freq: Three times a day (TID) | ORAL | 0 refills | Status: DC

## 2022-04-05 MED ORDER — ATOMOXETINE HCL 40 MG PO CAPS
40.0000 mg | ORAL_CAPSULE | Freq: Every day | ORAL | 0 refills | Status: DC
Start: 2022-04-05 — End: 2022-05-15

## 2022-04-05 MED ORDER — ATORVASTATIN CALCIUM 20 MG PO TABS: 20.0000 mg | ORAL_TABLET | Freq: Every day | ORAL | 1 refills | Status: AC

## 2022-04-14 ENCOUNTER — Telehealth: Payer: 59 | Admitting: Physician Assistant

## 2022-04-14 DIAGNOSIS — J069 Acute upper respiratory infection, unspecified: Secondary | ICD-10-CM

## 2022-04-14 MED ORDER — FLUTICASONE PROPIONATE 50 MCG/ACT NA SUSP: 2.0000 | Freq: Every day | NASAL | 0 refills | Status: DC

## 2022-04-14 MED ORDER — BENZONATATE 100 MG PO CAPS: 100.0000 mg | ORAL_CAPSULE | Freq: Three times a day (TID) | ORAL | 0 refills | Status: DC | PRN

## 2022-04-14 NOTE — Progress Notes (Signed)
E-Visit for Upper Respiratory Infection   We are sorry you are not feeling well.  Here is how we plan to help!  Based on what you have shared with me, it looks like you may have a viral upper respiratory infection.  Upper respiratory infections are caused by a large number of viruses; however, rhinovirus is the most common cause.   Giving your health issues, I highly recommend testing for COVID despite your husband's results, to be cautious. Let us know if this comes back positive.   Symptoms vary from person to person, with common symptoms including sore throat, cough, fatigue or lack of energy and feeling of general discomfort.  A low-grade fever of up to 100.4 may present, but is often uncommon.  Symptoms vary however, and are closely related to a person's age or underlying illnesses.  The most common symptoms associated with an upper respiratory infection are nasal discharge or congestion, cough, sneezing, headache and pressure in the ears and face.  These symptoms usually persist for about 3 to 10 days, but can last up to 2 weeks.  It is important to know that upper respiratory infections do not cause serious illness or complications in most cases.    Upper respiratory infections can be transmitted from person to person, with the most common method of transmission being a person's hands.  The virus is able to live on the skin and can infect other persons for up to 2 hours after direct contact.  Also, these can be transmitted when someone coughs or sneezes; thus, it is important to cover the mouth to reduce this risk.  To keep the spread of the illness at [place], good hand hygiene is very important.  This is an infection that is most likely caused by a virus. There are no specific treatments other than to help you with the symptoms until the infection runs its course.  We are sorry you are not feeling well.  Here is how we plan to help!   For nasal congestion, you may use an oral decongestants such  as Mucinex D or if you have glaucoma or high blood pressure use plain Mucinex.  Saline nasal spray or nasal drops can help and can safely be used as often as needed for congestion.  For your congestion, I have prescribed Fluticasone nasal spray one spray in each nostril twice a day  If you do not have a history of heart disease, hypertension, diabetes or thyroid disease, prostate/bladder issues or glaucoma, you may also use Sudafed to treat nasal congestion.  It is highly recommended that you consult with a pharmacist or your primary care physician to ensure this medication is safe for you to take.     If you have a cough, you may use cough suppressants such as Delsym and Robitussin.  If you have glaucoma or high blood pressure, you can also use Coricidin HBP.   For cough I have prescribed for you A prescription cough medication called Tessalon Perles 100 mg. You may take 1-2 capsules every 8 hours as needed for cough  If you have a sore or scratchy throat, use a saltwater gargle-  to  teaspoon of salt dissolved in a 4-ounce to 8-ounce glass of warm water.  Gargle the solution for approximately 15-30 seconds and then spit.  It is important not to swallow the solution.  You can also use throat lozenges/cough drops and Chloraseptic spray to help with throat pain or discomfort.  Warm or cold liquids can  also be helpful in relieving throat pain.  For headache, pain or general discomfort, you can use Ibuprofen or Tylenol as directed.   Some authorities believe that zinc sprays or the use of Echinacea may shorten the course of your symptoms.   HOME CARE Only take medications as instructed by your medical team. Be sure to drink plenty of fluids. Water is fine as well as fruit juices, sodas and electrolyte beverages. You may want to stay away from caffeine or alcohol. If you are nauseated, try taking [place] sips of liquids. How do you know if you are getting enough fluid? Your urine should be a pale yellow  or almost colorless. Get rest. Taking a steamy shower or using a humidifier may help nasal congestion and ease sore throat pain. You can place a towel over your head and breathe in the steam from hot water coming from a faucet. Using a saline nasal spray works much the same way. Cough drops, hard candies and sore throat lozenges may ease your cough. Avoid close contacts especially the very young and the elderly Cover your mouth if you cough or sneeze Always remember to wash your hands.   GET HELP RIGHT AWAY IF: You develop worsening fever. If your symptoms do not improve within 10 days You develop yellow or green discharge from your nose over 3 days. You have coughing fits You develop a severe head ache or visual changes. You develop shortness of breath, difficulty breathing or start having chest pain Your symptoms persist after you have completed your treatment plan  MAKE SURE YOU  Understand these instructions. Will watch your condition. Will get help right away if you are not doing well or get worse.  Thank you for choosing an e-visit.  Your e-visit answers were reviewed by a board certified advanced clinical practitioner to complete your personal care plan. Depending upon the condition, your plan could have included both over the counter or prescription medications.  Please review your pharmacy choice. Make sure the pharmacy is open so you can pick up prescription now. If there is a problem, you may contact your provider through [employer] and have the prescription routed to another pharmacy.  Your safety is important to [place]. If you have drug allergies check your prescription carefully.   For the next 24 hours you can use MyChart to ask questions about today's visit, request a non-urgent call back, or ask for a work or school excuse. You will get an email in the next two days asking about your experience. I hope that your e-visit has been valuable and will speed your  recovery.

## 2022-04-14 NOTE — Progress Notes (Signed)
I have spent 5 minutes in review of e-visit questionnaire, review and updating patient chart, medical decision making and response to patient.   Rosmarie Esquibel Cody Lora Chavers, PA-C    

## 2022-04-22 ENCOUNTER — Encounter: Payer: Self-pay | Admitting: Internal Medicine

## 2022-04-23 ENCOUNTER — Other Ambulatory Visit: Payer: Self-pay

## 2022-04-23 ENCOUNTER — Emergency Department (HOSPITAL_COMMUNITY): Payer: 59

## 2022-04-23 ENCOUNTER — Telehealth: Payer: 59 | Admitting: Family

## 2022-04-23 ENCOUNTER — Encounter (HOSPITAL_COMMUNITY): Payer: Self-pay | Admitting: Emergency Medicine

## 2022-04-23 ENCOUNTER — Emergency Department (HOSPITAL_COMMUNITY)
Admission: EM | Admit: 2022-04-23 | Discharge: 2022-04-23 | Disposition: A | Discharge status: Home / Self Care | Payer: 59 | Attending: Emergency Medicine | Admitting: Emergency Medicine

## 2022-04-23 ENCOUNTER — Ambulatory Visit
Admission: EM | Admit: 2022-04-23 | Discharge: 2022-04-23 | Disposition: A | Discharge status: Home / Self Care | Payer: 59 | Attending: Internal Medicine | Admitting: Internal Medicine

## 2022-04-23 DIAGNOSIS — Z23 Encounter for immunization: Secondary | ICD-10-CM | POA: Diagnosis not present

## 2022-04-23 DIAGNOSIS — Z7984 Long term (current) use of oral hypoglycemic drugs: Secondary | ICD-10-CM | POA: Insufficient documentation

## 2022-04-23 DIAGNOSIS — S80212A Abrasion, left knee, initial encounter: Secondary | ICD-10-CM | POA: Diagnosis not present

## 2022-04-23 DIAGNOSIS — R220 Localized swelling, mass and lump, head: Secondary | ICD-10-CM | POA: Diagnosis not present

## 2022-04-23 DIAGNOSIS — M79641 Pain in right hand: Secondary | ICD-10-CM | POA: Insufficient documentation

## 2022-04-23 DIAGNOSIS — W19XXXA Unspecified fall, initial encounter: Secondary | ICD-10-CM

## 2022-04-23 DIAGNOSIS — E119 Type 2 diabetes mellitus without complications: Secondary | ICD-10-CM | POA: Insufficient documentation

## 2022-04-23 DIAGNOSIS — S80219A Abrasion, unspecified knee, initial encounter: Secondary | ICD-10-CM

## 2022-04-23 DIAGNOSIS — R519 Headache, unspecified: Secondary | ICD-10-CM | POA: Diagnosis not present

## 2022-04-23 DIAGNOSIS — S80211A Abrasion, right knee, initial encounter: Secondary | ICD-10-CM | POA: Diagnosis not present

## 2022-04-23 DIAGNOSIS — Z043 Encounter for examination and observation following other accident: Secondary | ICD-10-CM | POA: Diagnosis not present

## 2022-04-23 DIAGNOSIS — J45909 Unspecified asthma, uncomplicated: Secondary | ICD-10-CM | POA: Insufficient documentation

## 2022-04-23 DIAGNOSIS — W1839XA Other fall on same level, initial encounter: Secondary | ICD-10-CM | POA: Diagnosis not present

## 2022-04-23 DIAGNOSIS — Z853 Personal history of malignant neoplasm of breast: Secondary | ICD-10-CM | POA: Diagnosis not present

## 2022-04-23 DIAGNOSIS — Z794 Long term (current) use of insulin: Secondary | ICD-10-CM | POA: Diagnosis not present

## 2022-04-23 DIAGNOSIS — S0081XA Abrasion of other part of head, initial encounter: Secondary | ICD-10-CM | POA: Insufficient documentation

## 2022-04-23 DIAGNOSIS — S8991XA Unspecified injury of right lower leg, initial encounter: Secondary | ICD-10-CM | POA: Diagnosis not present

## 2022-04-23 DIAGNOSIS — S0993XA Unspecified injury of face, initial encounter: Secondary | ICD-10-CM

## 2022-04-23 DIAGNOSIS — Y93K1 Activity, walking an animal: Secondary | ICD-10-CM | POA: Diagnosis not present

## 2022-04-23 MED ORDER — ACETAMINOPHEN 500 MG PO TABS
1000.0000 mg | ORAL_TABLET | Freq: Once | ORAL | Status: AC
  Administered 2022-04-23: 1000 mg via ORAL
  Filled 2022-04-23: qty 2

## 2022-04-23 MED ORDER — DICLOFENAC SODIUM 75 MG PO TBEC: 75.0000 mg | DELAYED_RELEASE_TABLET | Freq: Two times a day (BID) | ORAL | 0 refills | Status: DC

## 2022-04-23 MED ORDER — BACITRACIN ZINC 500 UNIT/GM EX OINT: 1.0000 | TOPICAL_OINTMENT | Freq: Two times a day (BID) | CUTANEOUS | 0 refills | Status: DC

## 2022-04-23 MED ORDER — TETANUS-DIPHTH-ACELL PERTUSSIS 5-2.5-18.5 LF-MCG/0.5 IM SUSY
0.5000 mL | PREFILLED_SYRINGE | Freq: Once | INTRAMUSCULAR | Status: AC
  Administered 2022-04-23: 0.5 mL via INTRAMUSCULAR
  Filled 2022-04-23: qty 0.5

## 2022-04-23 MED ORDER — BACITRACIN ZINC 500 UNIT/GM EX OINT
TOPICAL_OINTMENT | Freq: Once | CUTANEOUS | Status: AC
  Filled 2022-04-23: qty 0.9

## 2022-04-23 NOTE — ED Provider Notes (Signed)
Imaging negative for fracture or dislocation.  Discussed treatment of abrasions and OTC meds.  PCP follow-up.   Montine Circle, PA-C 04/23/22 2303    Merryl Hacker, MD 04/23/22 [phone]

## 2022-04-23 NOTE — ED Notes (Signed)
Patient is being discharged from the Urgent Care and sent to the Emergency Department via self . Per Hildred Alamin, patient is in need of higher level of care due to head injury. Patient is aware and verbalizes understanding of plan of care.  Vitals:   04/23/22 1820  BP: (!) 154/83  Pulse: 92  Resp: 16  Temp: 98.6 F (37 C)  SpO2: 95%

## 2022-04-23 NOTE — Discharge Instructions (Signed)
Go to the emergency department for further evaluation and management.

## 2022-04-23 NOTE — ED Notes (Signed)
Patient's wounds on bilateral knees and left side of face cleansed with saline; bacitracin applied, non-stick pad and Coban to wounds on knees by Faith, NT.

## 2022-04-23 NOTE — ED Provider Notes (Signed)
[facility] DEPT Provider Note   CSN: [id number] Arrival date & time: 04/23/22  1928     History  Chief Complaint  Patient presents with   Kelly Mendez is a 46 y.o. female presenting today with multiple bodily pains and injuries following a fall around 4 PM today.  Was walking her dog when she was tugged and fell forward landing directly on her knees, right hand/palm, and left face.  Denies LOC, N/V, neck pain or stiffness, vision changes, chest pain, or shortness of breath.  Denies lightheadedness or dizziness before or after the fall.  Denies knee pain or limited ROM, though with superficial abrasions that are mildly painful.  Denies difficulty walking, however reports some mild lightheadedness and a left-sided headache.  Was seen at UC earlier today and recommended to come to the ED for possible CT imaging.  No other complaints at this time.  Unsure of last tetanus booster, though believes it was at least a few years ago.  The history is provided by the patient and medical records.  Fall Associated symptoms include headaches.     Home Medications Prior to Admission medications   Medication Sig Start Date End Date Taking? Authorizing Provider  atomoxetine (STRATTERA) 40 MG capsule Take 1 capsule (40 mg total) by mouth daily. 04/05/22   Tysinger, Camelia Eng, PA-C  atorvastatin (LIPITOR) 20 MG tablet Take 1 tablet (20 mg total) by mouth daily. 04/05/22   Tysinger, Camelia Eng, PA-C  baclofen (LIORESAL) 10 MG tablet Take 1 tablet (10 mg total) by mouth 3 (three) times daily. 01/06/22   Mar Daring, PA-C  benzonatate (TESSALON) 100 MG capsule Take 1 capsule (100 mg total) by mouth 3 (three) times daily as needed for cough. 04/14/22   Brunetta Jeans, PA-C  cholecalciferol (VITAMIN D3) 25 MCG (1000 UNIT) tablet Take 1,000 Units by mouth daily.    [provider]  Continuous Blood Gluc Sensor (DEXCOM G6 SENSOR) MISC 1 Device by Does not  apply route as directed. 12/06/20   Shamleffer, Melanie Crazier, MD  Continuous Blood Gluc Transmit (DEXCOM G6 TRANSMITTER) MISC Use as instructed to check blood sugar daily 09/05/20   Shamleffer, Melanie Crazier, MD  diclofenac (VOLTAREN) 75 MG EC tablet Take 1 tablet (75 mg total) by mouth 2 (two) times daily. 04/23/22   Hawks, Alyse Low A, FNP  Dulaglutide (TRULICITY) 3 YQ/6.5HQ SOPN Inject 3 mg as directed once a week. 04/04/22   Shamleffer, Melanie Crazier, MD  fluticasone (FLONASE) 50 MCG/ACT nasal spray Place 2 sprays into both nostrils daily. 04/14/22   Brunetta Jeans, PA-C  gabapentin (NEURONTIN) 100 MG capsule Take 2 capsules (200 mg total) by mouth 3 (three) times daily. 04/05/22   Tysinger, Camelia Eng, PA-C  insulin detemir (LEVEMIR FLEXPEN) 100 UNIT/ML FlexPen Inject 30 Units into the skin daily. 04/04/22   Shamleffer, Melanie Crazier, MD  Insulin Pen Needle 29G X 5MM MISC 1 Device by Does not apply route daily in the afternoon. 11/08/21   Shamleffer, Melanie Crazier, MD  metFORMIN (GLUCOPHAGE-XR) 500 MG 24 hr tablet Take 2 tablets (1,000 mg total) by mouth 2 (two) times daily with a meal. 04/04/22   Shamleffer, Melanie Crazier, MD      Allergies    Lisinopril and Penicillins    Review of Systems   Review of Systems  HENT:         Face pain, left  Musculoskeletal:        Hand  pain, right  Skin:  Positive for wound.  Neurological:  Positive for headaches.    Physical Exam Updated Vital Signs BP (!) 181/107   Pulse 85   Temp 98.4 F (36.9 C) (Oral)   Resp 18   Ht '6\' 2"'$  (1.88 m)   Wt (!) 145.2 kg   LMP 01/10/2017   SpO2 97%   BMI 41.09 kg/m  Physical Exam Vitals and nursing note reviewed.  Constitutional:      General: She is not in acute distress.    Appearance: She is well-developed. She is not ill-appearing or toxic-appearing.  HENT:     Head: Normocephalic. Abrasion present. No raccoon eyes, Battle's sign or masses.     Jaw: There is normal jaw occlusion. No trismus,  tenderness, swelling or pain on movement.      Comments: Swelling, tenderness, ecchymosis as indicated above with superficial abrasion scattered around these areas.  Without nasal tenderness, or tenderness of remaining aspects of face.  Without temple tenderness.    Mouth/Throat:     Lips: Pink.     Mouth: Mucous membranes are moist.     Pharynx: Oropharynx is clear. Uvula midline.  Eyes:     General: Lids are normal. Gaze aligned appropriately.     Extraocular Movements: Extraocular movements intact.     Right eye: No nystagmus.     Left eye: No nystagmus.     Conjunctiva/sclera: Conjunctivae normal.     Pupils: Pupils are equal, round, and reactive to light.     Visual Fields: Right eye visual fields normal and left eye visual fields normal.  Cardiovascular:     Rate and Rhythm: Normal rate and regular rhythm.     Pulses:          Radial pulses are 2+ on the right side and 2+ on the left side.       Posterior tibial pulses are 2+ on the right side and 2+ on the left side.     Heart sounds: No murmur heard. Pulmonary:     Effort: Pulmonary effort is normal. No respiratory distress.     Breath sounds: Normal breath sounds.  Abdominal:     Palpations: Abdomen is soft.     Tenderness: There is no abdominal tenderness.  Musculoskeletal:        General: No swelling.       Hands:     Cervical back: Neck supple. No rigidity or tenderness.       Legs:     Comments:  Right hand: Tenderness as depicted above, without decrease sensation, reduced ROM, significant swelling, ecchymosis, erythema, bony abnormality, abrasion, or laceration. Bilateral knees: Superficial abrasions depicted above, tender to palpation.  ROM appears unaffected, without malrotation, edema, swelling, bony tenderness, or bony deformity.  Skin:    General: Skin is warm and dry.     Capillary Refill: Capillary refill takes less than 2 seconds.     Coloration: Skin is not jaundiced or pale.  Neurological:     Mental  Status: She is alert and oriented to person, place, and time.     GCS: GCS eye subscore is 4. GCS verbal subscore is 5. GCS motor subscore is 6.  Psychiatric:        Mood and Affect: Mood normal.     ED Results / Procedures / Treatments   Labs (all labs ordered are listed, but only abnormal results are displayed) Labs Reviewed - No data to display  EKG None  Radiology  No results found.  Procedures Procedures    Medications Ordered in ED Medications  acetaminophen (TYLENOL) tablet 1,000 mg (has no administration in time range)  Tdap (BOOSTRIX) injection 0.5 mL (has no administration in time range)  bacitracin ointment (has no administration in time range)    ED Course/ Medical Decision Making/ A&P                           Medical Decision Making  46 y.o. female presents to the ED for concern of Fall   This involves an extensive number of treatment options, and is a complaint that carries with it a high risk of complications and morbidity.  The emergent differential diagnosis prior to evaluation includes, but is not limited to: Fracture, ICH, abrasion, laceration, contusion, concussion  This is not an exhaustive differential.   Past Medical History / Co-morbidities / Social History: Hx of asthma, DMT2, PTSD, prior DVT, prior breast cancer, OSA, ADHD, depression, anxiety, hyperlipidemia, neuropathy, sciatica, Social Determinants of Health include: Chronic tobacco use, for cessation counseling was provided  Additional History:  Obtained by chart review.  Notably recent urgent care visit from today, see for details  Lab Tests: I ordered, and personally interpreted labs.  The pertinent results include:   Considered urine pregnancy, however patient with previous hysterectomy  Imaging Studies: I ordered imaging studies including XR right hand, CT head and CT maxillofacial.  Imaging pending.  ED Course: Pt overall well-appearing on exam.  Presenting today with multiple  bodily pains and injuries following a fall around 4 PM today.  Was walking her dog when she was tugged and fell forward landing directly on her knees, right hand/palm, and left face.  Without LOC, N/V, neck pain or stiffness, vision changes, chest pain, or shortness of breath.  Without lightheadedness or dizziness before or after the fall.  However reports some mild lightheadedness and a left-sided headache since arrival at [facility] today.  Unknown recent Tdap.  Seen at UC earlier today and recommended come to the ED for further evaluation, including possible CT imaging. Nontoxic, nonseptic appearing in NAD.  Gait appears grossly intact.  Fall appears mechanical in nature.  Facial tenderness of left zygomatic arch and periorbital structures.  PERRLA, with normal EOMs.  No evidence of EOM entrapment.  Without obvious bony deformity of the face, however plan to further evaluate with CT imaging.  No clinical evidence of basilar skull fracture or trauma to the eye.  Vision appears grossly intact.  Without evidence of other facial trauma.  Exam of lower extremities suggest superficial tender abrasions without suspicion for acute fracture, dislocation, or wound amenable to closure.  Patient also reports mild tenderness to the right palmar region of the hand, states she forgot to mention this at the UC visit.  On exam, ROM appears grossly intact of digits and wrist.  Mild tenderness on the proximal palmar region, without significant edema, discoloration, or anatomical snuffbox tenderness.  Upper extremities and lower extremities appear neurovascularly intact.  Tylenol provided. CT imaging and x-ray imaging pending at time of handoff.  If evidence of ICH, plan to treat appropriately.  If evidence of fracture with complication, may consider consultation with appropriate entity (ENT, Neurosurgery).  However if fracture appears minimal without complication or concerning feature,  may consider close outpatient follow up with ENT.   If evidence of respiratory/hand fracture, plan to follow-up with hand specialist and likely splint application.  Otherwise may consider splint with  still orthopedic follow-up and conservative management.  Plan to redress wounds of the knees and face.  This plan may be altered or completely changed at the discretion of the oncoming team pending results of further workup.  Disposition: 2205 care of Kelly Mendez transferred to PA Montine Circle at the end of my shift as the patient will require reassessment once labs/imaging have resulted.  Patient presentation, ED course, and plan of care discussed with review of all pertinent labs and imaging.  Please see his/her note for further details regarding further ED course and disposition.    This chart was dictated using voice recognition software.  Despite best efforts to proofread, errors can occur which can change the documentation meaning.         Final Clinical Impression(s) / ED Diagnoses Final diagnoses:  Fall, initial encounter    Rx / DC Orders ED Discharge Orders          Ordered    bacitracin ointment  2 times daily        04/23/22 2227              Candace Cruise 25/[birth year] 2228    Hayden Rasmussen, MD 04/24/22 1106

## 2022-04-23 NOTE — ED Provider Notes (Signed)
[facility]    CSN: [id number] Arrival date & time: 04/23/22  1756      History   Chief Complaint Chief Complaint  Patient presents with   Head Injury    I completed a virtual visit regarding a recent fall and they suggested I come there for xrays - Entered by patient    HPI Kelly Mendez is a 46 y.o. female.   Patient presents for further evaluation after a fall that occurred around 4 PM today.  Patient reports that she was walking her dog when she was tugged forward and fell landing directly on bilateral knees and face.  Patient denies that she lost consciousness.  Patient does report that she has left-sided facial pain/eye pain given direct impact injury.  Denies blurry vision but does report some intermittent dizziness and left-sided headache.  Patient also has bilateral knee abrasions but denies that it is painful.  Last tetanus vaccine was multiple years ago per patient.  Had E-visit today and was advised to come be seen in person due to possible need of facial imaging.    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   Concussion    around age 68   Depression    Diabetes type 2, uncontrolled    new diagnosis in 08/2016   DVT (deep venous thrombosis) ([place]) 11/24/2016   in rt arm   Family history of breast cancer    Family history of neurofibromatosis    Family history of ovarian cancer    History of radiation therapy 04/21/17- 05/20/17   Right Breast 40.05 Gy in 15 fractions followed by a 10 Gy boost in 5 fractions to yield a total dose of 50.05 Gy   HPV in female    Hyperlipemia    Hypertension    Malignant neoplasm of upper-inner quadrant of right breast in female, estrogen receptor negative ([place]) 10/21/2016    Neuromuscular disorder ([place])    neuropathy   Pelvic mass in female 06/16/2017   Personal history of chemotherapy 2018   Personal history of radiation therapy 2018   Pneumonia    2014    PTSD (post-traumatic stress disorder)    Sciatica    Sleep apnea    SOB (shortness of breath) on exertion    Vitamin D deficiency     Patient Active Problem List   Diagnosis Date Noted   Class 3 severe obesity with serious comorbidity and body mass index (BMI) of 45.0 to 49.9 in adult ([place]) 11/03/2021   Hyperlipidemia associated with type 2 diabetes mellitus ([place]) 11/01/2021   History of chemotherapy 11/01/2021   History of radiation therapy 11/01/2021   Lymphedema of arm 11/01/2021   Polyneuropathy 11/01/2021   OSA (obstructive sleep apnea) 06/19/2020   Insomnia 06/19/2020   Herniation of lumbar intervertebral disc with radiculopathy 05/04/2020    Class: Chronic   Spinal stenosis of lumbar region 05/04/2020   Vitamin D deficiency 04/27/2020   Elevated blood pressure reading in office with diagnosis of hypertension 04/27/2020   Attention deficit hyperactivity disorder (ADHD), predominantly inattentive type 04/11/2020   Moderate episode of recurrent major depressive disorder ([place])  04/11/2020   Generalized anxiety disorder    Chronic right-sided low back pain with right-sided sciatica 10/24/2019   Fatigue 10/24/2019   Type 2 diabetes mellitus with hyperglycemia, without long-term current use of insulin ([place]) 08/19/2019   Hot flashes 08/13/2018   Post traumatic stress disorder (PTSD) 02/25/2018   Anxiety    Genetic testing 12/15/2016   Family history of breast cancer    Family history of ovarian cancer    Family history of neurofibromatosis    Type 2 diabetes mellitus with diabetic polyneuropathy, without long-term current use of insulin (HCC)    Low HDL (under 40) 09/16/2016   ASCVD (arteriosclerotic cardiovascular disease) 09/16/2016   Light cigarette smoker 09/15/2016   Morbid obesity  ([place]) 09/15/2016   Mild intermittent asthma 09/15/2016    Past Surgical History:  Procedure Laterality Date   BREAST BIOPSY Right 12/13/2019   BREAST LUMPECTOMY Right 03/18/2017   BREAST LUMPECTOMY WITH RADIOACTIVE SEED AND SENTINEL LYMPH NODE BIOPSY Right 03/18/2017   Procedure: RIGHT BREAST LUMPECTOMY WITH RADIOACTIVE SEED AND RIGHT SENTINEL LYMPH NODE BIOPSY;  Surgeon: [name], MD;  Location: [facility];  Service: General;  Laterality: Right;   COLPOSCOPY     left ovary removed  1978   was told her ovary was removed but Dr. Denman George found that was not the case. Suspect this was originally an ovarian cystectomy   LUMBAR LAMINECTOMY/DECOMPRESSION MICRODISCECTOMY N/A 05/04/2020   Procedure: BILATERAL LUMBAR FIVE THROUGH SACRAL ONE MICRODISCECTOMY;  Surgeon: Jessy Oto, MD;  Location: [facility];  Service: Orthopedics;  Laterality: N/A;   PORTACATH PLACEMENT Right 10/29/2016   Procedure: INSERTION PORT-A-CATH WITH [place];  Surgeon: [name], MD;  Location: [facility];  Service: General;  Laterality: Right;   portacath removal     oct. 5 2018   ROBOTIC ASSISTED TOTAL HYSTERECTOMY Bilateral 06/16/2017   Procedure: XI ROBOTIC ASSISTED TOTAL HYSTERECTOMY WITH BILATERAL SALPINGECTOMY,  OOPHERECTOMY;  Surgeon: [name], MD;  Location: [facility];  Service: Gynecology;  Laterality: Bilateral;    OB History     Gravida  3   Para      Term      Preterm      AB      Living  3      SAB      IAB      Ectopic      Multiple      Live Births               Home Medications    Prior to Admission medications   Medication Sig Start Date End Date Taking? Authorizing Provider  atomoxetine (STRATTERA) 40 MG capsule Take 1 capsule (40 mg total) by mouth daily. 04/05/22   Tysinger, Camelia Eng, PA-C  atorvastatin (LIPITOR) 20 MG tablet Take 1 tablet (20 mg total) by mouth daily. 04/05/22   Tysinger, Camelia Eng, PA-C  baclofen (LIORESAL) 10 MG tablet Take 1 tablet (10 mg  total) by mouth 3 (three) times daily. 01/06/22   Mar Daring, PA-C  benzonatate (TESSALON) 100 MG capsule Take 1 capsule (100 mg total) by mouth 3 (three) times daily as needed for cough. 04/14/22   Brunetta Jeans, PA-C  cholecalciferol (VITAMIN D3) 25 MCG (1000 UNIT) tablet Take 1,000 Units by mouth daily.    [provider]  Continuous Blood Gluc Sensor (DEXCOM G6 SENSOR) MISC 1 Device by Does not apply route as directed. 12/06/20   Shamleffer, Melanie Crazier, MD  Continuous Blood Gluc  Transmit (DEXCOM G6 TRANSMITTER) MISC Use as instructed to check blood sugar daily 09/05/20   Shamleffer, Melanie Crazier, MD  diclofenac (VOLTAREN) 75 MG EC tablet Take 1 tablet (75 mg total) by mouth 2 (two) times daily. 04/23/22   Hawks, Alyse Low A, FNP  Dulaglutide (TRULICITY) 3 JQ/7.3AL SOPN Inject 3 mg as directed once a week. 04/04/22   Shamleffer, Melanie Crazier, MD  fluticasone (FLONASE) 50 MCG/ACT nasal spray Place 2 sprays into both nostrils daily. 04/14/22   Brunetta Jeans, PA-C  gabapentin (NEURONTIN) 100 MG capsule Take 2 capsules (200 mg total) by mouth 3 (three) times daily. 04/05/22   Tysinger, Camelia Eng, PA-C  insulin detemir (LEVEMIR FLEXPEN) 100 UNIT/ML FlexPen Inject 30 Units into the skin daily. 04/04/22   Shamleffer, Melanie Crazier, MD  Insulin Pen Needle 29G X 5MM MISC 1 Device by Does not apply route daily in the afternoon. 11/08/21   Shamleffer, Melanie Crazier, MD  metFORMIN (GLUCOPHAGE-XR) 500 MG 24 hr tablet Take 2 tablets (1,000 mg total) by mouth 2 (two) times daily with a meal. 04/04/22   Shamleffer, Melanie Crazier, MD    Family History Family History  Problem Relation Age of Onset   Breast cancer Mother 61   Depression Mother    Drug abuse Mother    Hepatitis C Father    Diabetes Father    Hypertension Father    Drug abuse Father    Obesity Father    Ovarian cancer Maternal Aunt        dx in her 33s   Ovarian cancer Maternal Aunt        dx in her 87s    Neurofibromatosis Maternal Aunt    Ovarian cancer Maternal Aunt    Neurofibromatosis Maternal Aunt    Cervical cancer Maternal Aunt    Neurofibromatosis Maternal Aunt    Cancer Maternal Aunt        cancer on the bottom of her foot   Neurofibromatosis Maternal Uncle    Brain cancer Maternal Uncle 58   Lung cancer Maternal Grandfather    Kidney failure Paternal Grandmother    Heart attack Paternal Grandfather    Breast cancer Cousin    Breast cancer Other        MGF's sisters   Neurofibromatosis Other        MGM's paternal aunt   Colon cancer Neg Hx     Social History Social History   Tobacco Use   Smoking status: Every Day    Packs/day: 0.10    Years: 25.00    Total pack years: 2.50    Types: Cigarettes    Last attempt to quit: 11/25/2016    Years since quitting: 5.4   Smokeless tobacco: Never  Vaping Use   Vaping Use: Never used  Substance Use Topics   Alcohol use: Yes    Alcohol/week: 1.0 standard drink of alcohol    Types: 1 Glasses of wine per week    Comment: occ   Drug use: No     Allergies   Lisinopril and Penicillins   Review of Systems Review of Systems Per HPI  Physical Exam Triage Vital Signs ED Triage Vitals [04/23/22 1820]  Enc Vitals Group     BP (!) 154/83     Pulse Rate 92     Resp 16     Temp 98.6 F (37 C)     Temp Source Oral     SpO2 95 %     Weight  Height      Head Circumference      Peak Flow      Pain Score 8     Pain Loc      Pain Edu?      Excl. in [place]?    No data found.  Updated Vital Signs BP (!) 154/83 (BP Location: Left Arm)   Pulse 92   Temp 98.6 F (37 C) (Oral)   Resp 16   LMP 01/10/2017   SpO2 95%   Visual Acuity Right Eye Distance:   Left Eye Distance:   Bilateral Distance:    Right Eye Near:   Left Eye Near:    Bilateral Near:     Physical Exam Constitutional:      General: She is not in acute distress.    Appearance: Normal appearance. She is not toxic-appearing or diaphoretic.  HENT:      Head: Normocephalic and atraumatic.      Comments: Patient has bruising, swelling, associated mild superficial abrasion present directly beneath left lower eye that extends slightly to lateral portion of face.  Patient also has Beazer abrasion with associated swelling present directly above left lateral eyebrow.  Patient has Sheeler abrasion directly below left nostril.  Bleeding is controlled.  No tenderness to palpation to nose. Eyes:     Extraocular Movements: Extraocular movements intact.     Conjunctiva/sclera: Conjunctivae normal.  Pulmonary:     Effort: Pulmonary effort is normal.  Musculoskeletal:     Comments: Mild tenderness to bilateral knee abrasions.  No other tenderness to palpation to bilateral knees.  Patient has full range of motion of knee and patient is neurovascularly intact. patient is able to bear weight.   Skin:    Comments: Bilateral knee abrasions that are approximately 2.5 inches in diameter.  Bleeding is controlled.  Neurological:     General: No focal deficit present.     Mental Status: She is alert and oriented to person, place, and time. Mental status is at baseline.     Cranial Nerves: Cranial nerves 2-12 are intact.     Sensory: Sensation is intact.     Motor: Motor function is intact.     Coordination: Coordination is intact.     Gait: Gait is intact.  Psychiatric:        Mood and Affect: Mood normal.        Behavior: Behavior normal.        Thought Content: Thought content normal.        Judgment: Judgment normal.      UC Treatments / Results  Labs (all labs ordered are listed, but only abnormal results are displayed) Labs Reviewed - No data to display  EKG   Radiology No results found.  Procedures Procedures (including critical care time)  Medications Ordered in UC Medications - No data to display  Initial Impression / Assessment and Plan / UC Course  I have reviewed the triage vital signs and the nursing notes.  Pertinent labs &  imaging results that were available during my care of the patient were reviewed by me and considered in my medical decision making (see chart for details).     Given direct facial/head injury, I do think that more advanced imaging is necessary such as CT imaging.  Do not have these capabilities here in urgent care so patient was advised to go to the ER for further evaluation and management.  Patient was agreeable with plan. Nonadherent dressings were applied to bilateral  knees prior to discharge.  Vital signs and neuro exam are stable at discharge.  Agree with patient's family member transporting her to the hospital.  Tetanus vaccine deferred to ED given further evaluation and management was deferred to ED.  Patient left via self transport. Final Clinical Impressions(s) / UC Diagnoses   Final diagnoses:  Fall, initial encounter  Facial injury, initial encounter  Abrasion of knee, bilateral     Discharge Instructions      Go to the emergency department for further evaluation and management.    ED Prescriptions   None    PDMP not reviewed this encounter.   Teodora Medici, [place] 04/23/22 [phone]

## 2022-04-23 NOTE — Progress Notes (Signed)
Virtual Visit Consent   Kelly Mendez, you are scheduled for a virtual visit with a [facility] provider today. Just as with appointments in the office, your consent must be obtained to participate. Your consent will be active for this visit and any virtual visit you may have with one of our providers in the next 365 days. If you have a MyChart account, a copy of this consent can be sent to you electronically.  As this is a virtual visit, video technology does not allow for your provider to perform a traditional examination. This may limit your provider's ability to fully assess your condition. If your provider identifies any concerns that need to be evaluated in person or the need to arrange testing (such as labs, EKG, etc.), we will make arrangements to do so. Although advances in technology are sophisticated, we cannot ensure that it will always work on either your end or our end. If the connection with a video visit is poor, the visit may have to be switched to a telephone visit. With either a video or telephone visit, we are not always able to ensure that we have a secure connection.  By engaging in this virtual visit, you consent to the provision of healthcare and authorize for your insurance to be billed (if applicable) for the services provided during this visit. Depending on your insurance coverage, you may receive a charge related to this service.  I need to obtain your verbal consent now. Are you willing to proceed with your visit today? Kelly Mendez has provided verbal consent on 04/23/2022 for a virtual visit (video or telephone). Evelina Dun, FNP  Date: 04/23/2022 5:18 PM  Virtual Visit via Video Note   I, Evelina Dun, connected with  Kelly Mendez  ([id number], [birth year]) on 04/23/22 at  5:15 PM EDT by a video-enabled telemedicine application and verified that I am speaking with the correct person using two identifiers.  Location: Patient: Virtual Visit Location Patient:  Home Provider: Virtual Visit Location Provider: Home Office   I discussed the limitations of evaluation and management by telemedicine and the availability of in person appointments. The patient expressed understanding and agreed to proceed.    History of Present Illness: Kelly Mendez is a 46 y.o. who identifies as a female who was assigned female at birth, and is being seen today for fall. Reports she was walking her dog few hours ago and trip and hit the road. She landed left side of her face and bilateral knees.   HPI: Fall The accident occurred 1 to 3 hours ago. She landed on [place]. The volume of blood lost was minimal. The point of impact was the face. The pain is present in the face, right knee and left knee. The pain is at a severity of 7/10. Associated symptoms include headaches. Pertinent negatives include no bowel incontinence, fever, hematuria, loss of consciousness, nausea or vomiting. She has tried rest for the symptoms. The treatment provided mild relief.    Problems:  Patient Active Problem List   Diagnosis Date Noted   Class 3 severe obesity with serious comorbidity and body mass index (BMI) of 45.0 to 49.9 in adult [facility]) 11/03/2021   Hyperlipidemia associated with type 2 diabetes mellitus ([place]) 11/01/2021   History of chemotherapy 11/01/2021   History of radiation therapy 11/01/2021   Lymphedema of arm 11/01/2021   Polyneuropathy 11/01/2021   OSA (obstructive sleep apnea) 06/19/2020   Insomnia 06/19/2020   Herniation of lumbar intervertebral disc  with radiculopathy 05/04/2020    Class: Chronic   Spinal stenosis of lumbar region 05/04/2020   Vitamin D deficiency 04/27/2020   Elevated blood pressure reading in office with diagnosis of hypertension 04/27/2020   Attention deficit hyperactivity disorder (ADHD), predominantly inattentive type 04/11/2020   Moderate episode of recurrent major depressive disorder ([place]) 04/11/2020   Generalized anxiety disorder    Chronic  right-sided low back pain with right-sided sciatica 10/24/2019   Fatigue 10/24/2019   Type 2 diabetes mellitus with hyperglycemia, without long-term current use of insulin ([place]) 08/19/2019   Hot flashes 08/13/2018   Post traumatic stress disorder (PTSD) 02/25/2018   Anxiety    Genetic testing 12/15/2016   Family history of breast cancer    Family history of ovarian cancer    Family history of neurofibromatosis    Type 2 diabetes mellitus with diabetic polyneuropathy, without long-term current use of insulin ([place])    Low HDL (under 40) 09/16/2016   ASCVD (arteriosclerotic cardiovascular disease) 09/16/2016   Light cigarette smoker 09/15/2016   Morbid obesity ([place]) 09/15/2016   Mild intermittent asthma 09/15/2016    Allergies:  Allergies  Allergen Reactions   Lisinopril     Cough, itchy throat   Penicillins Other (See Comments)    As a child Has patient had a PCN reaction causing immediate rash, facial/tongue/throat swelling, SOB or lightheadedness with hypotension: unknown Has patient had a PCN reaction causing severe rash involving mucus membranes or skin necrosis: unknown Has patient had a PCN reaction that required hospitalization unknown Has patient had a PCN reaction occurring within the last 10 years: no If all of the above answers are "NO", then may proceed with Cephalosporin use.    Medications:  Current Outpatient Medications:    diclofenac (VOLTAREN) 75 MG EC tablet, Take 1 tablet (75 mg total) by mouth 2 (two) times daily., Disp: 60 tablet, Rfl: 0   atomoxetine (STRATTERA) 40 MG capsule, Take 1 capsule (40 mg total) by mouth daily., Disp: 30 capsule, Rfl: 0   atorvastatin (LIPITOR) 20 MG tablet, Take 1 tablet (20 mg total) by mouth daily., Disp: 90 tablet, Rfl: 1   baclofen (LIORESAL) 10 MG tablet, Take 1 tablet (10 mg total) by mouth 3 (three) times daily., Disp: 30 each, Rfl: 0   benzonatate (TESSALON) 100 MG capsule, Take 1 capsule (100 mg total) by mouth 3 (three)  times daily as needed for cough., Disp: 30 capsule, Rfl: 0   cholecalciferol (VITAMIN D3) 25 MCG (1000 UNIT) tablet, Take 1,000 Units by mouth daily., Disp: , Rfl:    Continuous Blood Gluc Sensor (DEXCOM G6 SENSOR) MISC, 1 Device by Does not apply route as directed., Disp: 9 each, Rfl: 1   Continuous Blood Gluc Transmit (DEXCOM G6 TRANSMITTER) MISC, Use as instructed to check blood sugar daily, Disp: 1 each, Rfl: 2   Dulaglutide (TRULICITY) 3 CH/8.5ID SOPN, Inject 3 mg as directed once a week., Disp: 6 mL, Rfl: 3   fluticasone (FLONASE) 50 MCG/ACT nasal spray, Place 2 sprays into both nostrils daily., Disp: 16 g, Rfl: 0   gabapentin (NEURONTIN) 100 MG capsule, Take 2 capsules (200 mg total) by mouth 3 (three) times daily., Disp: 180 capsule, Rfl: 0   insulin detemir (LEVEMIR FLEXPEN) 100 UNIT/ML FlexPen, Inject 30 Units into the skin daily., Disp: 30 mL, Rfl: 1   Insulin Pen Needle 29G X 5MM MISC, 1 Device by Does not apply route daily in the afternoon., Disp: 100 each, Rfl: 2   metFORMIN (GLUCOPHAGE-XR)  500 MG 24 hr tablet, Take 2 tablets (1,000 mg total) by mouth 2 (two) times daily with a meal., Disp: 360 tablet, Rfl: 1  Observations/Objective: Patient is well-developed, well-nourished in no acute distress.  Resting comfortably   Head is normocephalic, atraumatic.  No labored breathing.  Speech is clear and coherent with logical content.  Patient is alert and oriented at baseline.  Bruising and swelling of left maxillary  Abrasion bilaterally knees  Assessment and Plan: 1. Fall on concrete  2. Abrasion of face, initial encounter - diclofenac (VOLTAREN) 75 MG EC tablet; Take 1 tablet (75 mg total) by mouth 2 (two) times daily.  Dispense: 60 tablet; Refill: 0  3. Abrasion of knee, unspecified laterality, initial encounter - diclofenac (VOLTAREN) 75 MG EC tablet; Take 1 tablet (75 mg total) by mouth 2 (two) times daily.  Dispense: 60 tablet; Refill: 0  4. Facial injury, initial  encounter  Diclofenac BID with food  No other NSAIDs No recommend facial x-ray to rule out maxillary fracture Ice Elevation   Follow Up Instructions: I discussed the assessment and treatment plan with the patient. The patient was provided an opportunity to ask questions and all were answered. The patient agreed with the plan and demonstrated an understanding of the instructions.  A copy of instructions were sent to the patient via MyChart unless otherwise noted below.    The patient was advised to call back or seek an in-person evaluation if the symptoms worsen or if the condition fails to improve as anticipated.  Time:  I spent 10 minutes with the patient via telehealth technology discussing the above problems/concerns.    Evelina Dun, FNP

## 2022-04-23 NOTE — ED Triage Notes (Signed)
Pt c/o walking dog and being pulled forward causing her to fall. States the first point of contact was under the left eye. Currently has headache. Denies loc. States she was advised to come have facial xray.

## 2022-04-23 NOTE — ED Triage Notes (Signed)
Pt c/o falling forward onto her face while walking her dog. Pt has abrasion and swelling with bruising under left eye. Pt denies LOC. Pt also c/o bilateral knee pain and abrasions

## 2022-04-25 ENCOUNTER — Encounter: Payer: 59 | Admitting: Physician Assistant

## 2022-05-15 ENCOUNTER — Other Ambulatory Visit: Payer: Self-pay | Admitting: Medical

## 2022-05-15 NOTE — Telephone Encounter (Signed)
Is this okay to refill? 

## 2022-05-27 ENCOUNTER — Encounter: Payer: Self-pay | Admitting: Internal Medicine

## 2022-06-23 ENCOUNTER — Other Ambulatory Visit: Payer: Self-pay | Admitting: Medical

## 2022-06-23 NOTE — Telephone Encounter (Signed)
Refill request last apt 11/01/21.

## 2022-09-25 ENCOUNTER — Other Ambulatory Visit: Payer: Self-pay | Admitting: Medical

## 2022-09-25 NOTE — Telephone Encounter (Signed)
Refill request last apt was 11/01/21 next apt 11/05/22.

## 2022-11-05 ENCOUNTER — Ambulatory Visit (INDEPENDENT_AMBULATORY_CARE_PROVIDER_SITE_OTHER): Payer: 59 | Admitting: Nurse Practitioner

## 2022-11-05 ENCOUNTER — Encounter: Payer: Self-pay | Admitting: Nurse Practitioner

## 2022-11-05 VITALS — BP 130/82 | HR 81 | Ht 73.0 in | Wt 332.6 lb

## 2022-11-05 DIAGNOSIS — Z0000 Encounter for general adult medical examination without abnormal findings: Secondary | ICD-10-CM | POA: Diagnosis not present

## 2022-11-05 DIAGNOSIS — E1140 Type 2 diabetes mellitus with diabetic neuropathy, unspecified: Secondary | ICD-10-CM

## 2022-11-05 DIAGNOSIS — E1142 Type 2 diabetes mellitus with diabetic polyneuropathy: Secondary | ICD-10-CM | POA: Diagnosis not present

## 2022-11-05 DIAGNOSIS — Z6841 Body Mass Index (BMI) 40.0 and over, adult: Secondary | ICD-10-CM

## 2022-11-05 DIAGNOSIS — N393 Stress incontinence (female) (male): Secondary | ICD-10-CM

## 2022-11-05 DIAGNOSIS — E1169 Type 2 diabetes mellitus with other specified complication: Secondary | ICD-10-CM

## 2022-11-05 DIAGNOSIS — G4733 Obstructive sleep apnea (adult) (pediatric): Secondary | ICD-10-CM

## 2022-11-05 DIAGNOSIS — I10 Essential (primary) hypertension: Secondary | ICD-10-CM

## 2022-11-05 DIAGNOSIS — Z794 Long term (current) use of insulin: Secondary | ICD-10-CM

## 2022-11-05 DIAGNOSIS — I2510 Atherosclerotic heart disease of native coronary artery without angina pectoris: Secondary | ICD-10-CM

## 2022-11-05 DIAGNOSIS — F900 Attention-deficit hyperactivity disorder, predominantly inattentive type: Secondary | ICD-10-CM | POA: Diagnosis not present

## 2022-11-05 DIAGNOSIS — E785 Hyperlipidemia, unspecified: Secondary | ICD-10-CM

## 2022-11-05 LAB — COMPREHENSIVE METABOLIC PANEL
ALT: 24 IU/L (ref 0–32)
AST: 16 IU/L (ref 0–40)
Albumin/Globulin Ratio: 1.8 (ref 1.2–2.2)
Albumin: 4 g/dL (ref 3.9–4.9)
Alkaline Phosphatase: 126 IU/L — ABNORMAL HIGH (ref 44–121)
BUN/Creatinine Ratio: 17 (ref 9–23)
BUN: 10 mg/dL (ref 6–24)
Bilirubin Total: 0.3 mg/dL (ref 0.0–1.2)
CO2: 21 mmol/L (ref 20–29)
Calcium: 9.2 mg/dL (ref 8.7–10.2)
Chloride: 102 mmol/L (ref 96–106)
Creatinine, Ser: 0.6 mg/dL (ref 0.57–1.00)
Globulin, Total: 2.2 g/dL (ref 1.5–4.5)
Glucose: 164 mg/dL — ABNORMAL HIGH (ref 70–99)
Potassium: 4.8 mmol/L (ref 3.5–5.2)
Sodium: 139 mmol/L (ref 134–144)
Total Protein: 6.2 g/dL (ref 6.0–8.5)
eGFR: 112 mL/min/{1.73_m2} (ref >59)

## 2022-11-05 LAB — CBC WITH DIFFERENTIAL/PLATELET
Basophils Absolute: 0 10*3/uL (ref 0.0–0.2)
Basos: 0 %
EOS (ABSOLUTE): 0.2 10*3/uL (ref 0.0–0.4)
Eos: 2 %
Hematocrit: 41.5 % (ref 34.0–46.6)
Hemoglobin: 13.1 g/dL (ref 11.1–15.9)
Immature Grans (Abs): 0 10*3/uL (ref 0.0–0.1)
Immature Granulocytes: 0 %
Lymphocytes Absolute: 1.7 10*3/uL (ref 0.7–3.1)
Lymphs: 24 %
MCH: 25 pg — ABNORMAL LOW (ref 26.6–33.0)
MCHC: 31.6 g/dL (ref 31.5–35.7)
MCV: 79 fL (ref 79–97)
Monocytes Absolute: 0.3 10*3/uL (ref 0.1–0.9)
Monocytes: 4 %
Neutrophils Absolute: 4.9 10*3/uL (ref 1.4–7.0)
Neutrophils: 70 %
Platelets: 268 10*3/uL (ref 150–450)
RBC: 5.24 x10E6/uL (ref 3.77–5.28)
RDW: 13.5 % (ref 11.7–15.4)
WBC: 7 10*3/uL (ref 3.4–10.8)

## 2022-11-05 LAB — LIPID PANEL
Chol/HDL Ratio: 2.7 ratio (ref 0.0–4.4)
Cholesterol, Total: 77 mg/dL — ABNORMAL LOW (ref 100–199)
HDL: 29 mg/dL — ABNORMAL LOW (ref >39)
LDL Chol Calc (NIH): 33 mg/dL (ref 0–99)
Triglycerides: 63 mg/dL (ref 0–149)
VLDL Cholesterol Cal: 15 mg/dL (ref 5–40)

## 2022-11-05 LAB — HEMOGLOBIN A1C
Est. average glucose Bld gHb Est-mCnc: 237 mg/dL
Hgb A1c MFr Bld: 9.9 % — ABNORMAL HIGH (ref 4.8–5.6)

## 2022-11-05 MED ORDER — TOUJEO MAX SOLOSTAR 300 UNIT/ML ~~LOC~~ SOPN: 10.0000 [IU] | PEN_INJECTOR | Freq: Every day | SUBCUTANEOUS | 0 refills | Status: AC

## 2022-11-05 MED ORDER — DEXCOM G7 SENSOR MISC: 11 refills | Status: AC

## 2022-11-05 MED ORDER — DEXCOM G7 RECEIVER DEVI: 0 refills | Status: AC

## 2022-11-05 NOTE — Assessment & Plan Note (Signed)
Labs pending. Continue atorvastatin.

## 2022-11-05 NOTE — Progress Notes (Signed)
Kelly Clamp, DNP, AGNP-c [facility] [address] [place], [place] [address] Main Office [phone]  BP 130/82   Pulse 81   Ht  (1.854 m)   Wt (!) 332 lb 9.6 oz (150.9 kg)   LMP 01/10/2017   BMI 43.88 kg/m    Subjective:    Patient ID: Kelly Mendez, female    DOB: [birth year], 47 y.o.   MRN: [id number]  HPI: Kelly Mendez is a 47 y.o. female presenting on 11/05/2022 for comprehensive medical examination.   Current medical concerns include: The patient presents today for a physical examination. She reports feeling tired but otherwise fine. She has a history of diabetes and has been trying her best to control her blood sugars. The patient has not been checking her blood sugars recently due to running out of Dexcom supplies and not having them covered by her current insurance. She previously used the Dexcom G6 and is open to trying the G7 if it is covered. The patient is no longer taking Levemir and has not been able to see her endocrinologist recently.  The patient reports experiencing urinary incontinence, particularly during coughs and sneezes. She attributes this to her body being older than her chronological age due to having a hysterectomy. She is interested in a referral for pelvic floor physical therapy to address this issue.  The patient has not had a diabetic eye exam within the last year and plans to get a regular eye exam in the coming weeks. She is experiencing difficulty with close-up vision and is considering bifocals or reading glasses. The patient has a history of cancer detected during her first mammogram at age 36, as well as a history of back surgery due to a bulging disc and sciatica that occurred after stretching.   Pertinent items are noted in HPI.  IMMUNIZATIONS:   Flu: Flu vaccine postponed until flu season Prevnar 13: Prevnar 13 N/A for this patient Prevnar 20: Prevnar 20 N/A for this patient Pneumovax 23: Pneumovax 23 N/A for  this patient Vac Shingrix: Shingrix N/A for this patient HPV: HPV N/A for this patient Tetanus: Tetanus completed in the last 10 years COVID: COVID completed, documentation in chart   HEALTH MAINTENANCE: Pap Smear HM Status: is up to date Mammogram HM Status: is up to date Colon Cancer Screening HM Status: is up to date Bone Density HM Status: is not applicable for this patient STI Testing HM Status: was declined  Lung CT HM Status: is not applicable for this patient  She reports regular vision exams q1-5y:  due at this time She reports regular dental exams q 47m:  Yes  She eats a fairly health diet. She endorses exercise and/or activity of: Sedentary  She currently: Marital Status: married Living situation: with family Sexual: monogamous  Most Recent Depression Screen:     11/05/2022    8:21 AM 11/01/2021    1:52 PM 11/01/2021    1:51 PM 01/02/2021   10:04 AM 10/10/2020    2:23 PM  Depression screen PHQ 2/9  Decreased Interest 0 0 0    Down, Depressed, Hopeless 0 0 0    PHQ - 2 Score 0 0 0    Altered sleeping  0     Tired, decreased energy  0     Change in appetite  0     Feeling bad or failure about yourself   0     Trouble concentrating  0     Moving slowly or  fidgety/restless  0     Suicidal thoughts  0     PHQ-9 Score  0     Difficult doing work/chores  Not difficult at all        Information is confidential and restricted. Go to Review Flowsheets to unlock data.   Most Recent Anxiety Screen:     01/02/2021   10:06 AM 10/10/2020    2:28 PM  GAD 7 : Generalized Anxiety Score  Nervous, Anxious, on Edge    Control/stop worrying    Worry too much - different things    Trouble relaxing    Restless    Easily annoyed or irritable    Afraid - awful might happen    Total GAD 7 Score    Anxiety Difficulty       Information is confidential and restricted. Go to Review Flowsheets to unlock data.   Most Recent Fall Screen:    11/05/2022    8:20 AM 11/01/2021     1:51 PM 08/10/2020    2:30 PM 10/24/2019    8:30 AM 01/12/2018    9:39 AM  Fall Risk   Falls in the past year? 1 0 0 0 No  Number falls in past yr: 0 0 0 0   Injury with Fall? 1 0 0 0   Comment walking dog fell      Risk for fall due to : No Fall Risks No Fall Risks     Follow up Falls evaluation completed Falls evaluation completed       Past medical history, surgical history, medications, allergies, family history and social history reviewed with patient today and changes made to appropriate areas of the chart.  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   Concussion    around age 89   Depression    Diabetes type 2, uncontrolled    new diagnosis in 08/2016   DVT (deep venous thrombosis) 11/24/2016   in rt arm   Family history of breast cancer    Family history of neurofibromatosis    Family history of ovarian cancer    History of radiation therapy 04/21/17- 05/20/17   Right Breast 40.05 Gy in 15 fractions followed by a 10 Gy boost in 5 fractions to yield a total dose of 50.05 Gy   HPV in female    Hyperlipemia    Hypertension    Malignant neoplasm of upper-inner quadrant of right breast in female, estrogen receptor negative 10/21/2016   Neuromuscular disorder    neuropathy   Pelvic mass in female 06/16/2017   Personal history of chemotherapy 2018   Personal history of radiation therapy 2018   Pneumonia    2014    PTSD (post-traumatic stress disorder)    Sciatica    Sleep apnea    SOB (shortness of breath) on exertion    Vitamin D deficiency    Medications:  Current Outpatient Medications on File Prior to Visit  Medication Sig   atomoxetine (STRATTERA) 40 MG capsule Take 1 capsule (40 mg total) by mouth daily. Please schedule  follow-up appointment to establish with new provider   atorvastatin (LIPITOR) 20  MG tablet Take 1 tablet (20 mg total) by mouth daily.   cholecalciferol (VITAMIN D3) 25 MCG (1000 UNIT) tablet Take 1,000 Units by mouth daily.   Dulaglutide (TRULICITY) 3 MG/0.5ML SOPN Inject 3 mg as directed once a week.   gabapentin (NEURONTIN) 100 MG capsule Take 2 capsules by mouth three times daily.   Insulin Pen Needle 29G X MISC 1 Device by Does not apply route daily in the afternoon.   metFORMIN (GLUCOPHAGE-XR) 500 MG 24 hr tablet Take 2 tablets (1,000 mg total) by mouth 2 (two) times daily with a meal.   No current facility-administered medications on file prior to visit.   Surgical History:  Past Surgical History:  Procedure Laterality Date   BREAST BIOPSY Right 12/13/2019   BREAST LUMPECTOMY Right 03/18/2017   BREAST LUMPECTOMY WITH RADIOACTIVE SEED AND SENTINEL LYMPH NODE BIOPSY Right 03/18/2017   Procedure: RIGHT BREAST LUMPECTOMY WITH RADIOACTIVE SEED AND RIGHT SENTINEL LYMPH NODE BIOPSY;  Surgeon: Harriette Bouillon, MD;  Location: [facility];  Service: General;  Laterality: Right;   COLPOSCOPY     left ovary removed  1978   was told her ovary was removed but Dr. Andrey Farmer found that was not the case. Suspect this was originally an ovarian cystectomy   LUMBAR LAMINECTOMY/DECOMPRESSION MICRODISCECTOMY N/A 05/04/2020   Procedure: BILATERAL LUMBAR FIVE THROUGH SACRAL ONE MICRODISCECTOMY;  Surgeon: Kerrin Champagne, MD;  Location: [facility] OR;  Service: Orthopedics;  Laterality: N/A;   PORTACATH PLACEMENT Right 10/29/2016   Procedure: INSERTION PORT-A-CATH WITH [place];  Surgeon: Harriette Bouillon, MD;  Location: [facility] OR;  Service: General;  Laterality: Right;   portacath removal     oct. 5 2018   ROBOTIC ASSISTED TOTAL HYSTERECTOMY Bilateral 06/16/2017   Procedure: XI ROBOTIC ASSISTED TOTAL HYSTERECTOMY WITH BILATERAL SALPINGECTOMY,  OOPHERECTOMY;  Surgeon: Adolphus Birchwood, MD;  Location: [facility];  Service:  Gynecology;  Laterality: Bilateral;   Allergies:  Allergies  Allergen Reactions   Lisinopril     Cough, itchy throat   Penicillins Other (See Comments)    As a child Has patient had a PCN reaction causing immediate rash, facial/tongue/throat swelling, SOB or lightheadedness with hypotension: unknown Has patient had a PCN reaction causing severe rash involving mucus membranes or skin necrosis: unknown Has patient had a PCN reaction that required hospitalization unknown Has patient had a PCN reaction occurring within the last 10 years: no If all of the above answers are "NO", then may proceed with Cephalosporin use.    Family History:  Family History  Problem Relation Age of Onset   Breast cancer Mother 34   Depression Mother    Drug abuse Mother    Hepatitis C Father    Diabetes Father    Hypertension Father    Drug abuse Father    Obesity Father    Ovarian cancer Maternal Aunt        dx in her 62s   Ovarian cancer Maternal Aunt        dx in her 30s   Neurofibromatosis Maternal Aunt    Ovarian cancer Maternal Aunt    Neurofibromatosis Maternal Aunt    Cervical cancer Maternal Aunt    Neurofibromatosis Maternal Aunt    Cancer Maternal Aunt        cancer on the bottom of her foot   Neurofibromatosis Maternal Uncle    Brain cancer Maternal Uncle 57   Lung cancer Maternal Grandfather    Kidney failure Paternal Grandmother    Heart  attack Paternal Grandfather    Breast cancer Cousin    Breast cancer Other        MGF's sisters   Neurofibromatosis Other        MGM's paternal aunt   Colon cancer Neg Hx        Objective:    BP 130/82   Pulse 81   Ht  (1.854 m)   Wt (!) 332 lb 9.6 oz (150.9 kg)   LMP 01/10/2017   BMI 43.88 kg/m   Wt Readings from Last 3 Encounters:  11/05/22 (!) 332 lb 9.6 oz (150.9 kg)  04/23/22 (!) 320 lb (145.2 kg)  12/27/21 (!) 332 lb (150.6 kg)    Physical Exam Vitals and nursing note reviewed.  Constitutional:      General: She is  not in acute distress.    Appearance: Normal appearance.  HENT:     Head: Normocephalic and atraumatic.     Right Ear: Hearing, tympanic membrane, ear canal and external ear normal.     Left Ear: Hearing, tympanic membrane, ear canal and external ear normal.     Nose: Nose normal.     Right Sinus: No maxillary sinus tenderness or frontal sinus tenderness.     Left Sinus: No maxillary sinus tenderness or frontal sinus tenderness.     Mouth/Throat:     Lips: Pink.     Mouth: Mucous membranes are moist.     Pharynx: Oropharynx is clear.  Eyes:     General: Lids are normal. Vision grossly intact.     Extraocular Movements: Extraocular movements intact.     Conjunctiva/sclera: Conjunctivae normal.     Pupils: Pupils are equal, round, and reactive to light.     Funduscopic exam:    Right eye: Red reflex present.        Left eye: Red reflex present.    Visual Fields: Right eye visual fields normal and left eye visual fields normal.  Neck:     Thyroid: No thyromegaly.     Vascular: No carotid bruit.  Cardiovascular:     Rate and Rhythm: Normal rate and regular rhythm.     Chest Wall: PMI is not displaced.     Pulses: Normal pulses.          Dorsalis pedis pulses are 2+ on the right side and 2+ on the left side.       Posterior tibial pulses are 2+ on the right side and 2+ on the left side.     Heart sounds: Normal heart sounds. No murmur heard. Pulmonary:     Effort: Pulmonary effort is normal. No respiratory distress.     Breath sounds: Normal breath sounds.  Abdominal:     General: Abdomen is flat. Bowel sounds are normal. There is no distension.     Palpations: Abdomen is soft. There is no hepatomegaly, splenomegaly or mass.     Tenderness: There is no abdominal tenderness. There is no right CVA tenderness, left CVA tenderness, guarding or rebound.  Musculoskeletal:        General: Normal range of motion.     Cervical back: Full passive range of motion without pain, normal range  of motion and neck supple. No tenderness.     Right lower leg: No edema.     Left lower leg: No edema.  Feet:     Left foot:     Toenail Condition: Left toenails are normal.  Lymphadenopathy:     Cervical: No cervical  adenopathy.     Upper Body:     Right upper body: No supraclavicular adenopathy.     Left upper body: No supraclavicular adenopathy.  Skin:    General: Skin is warm and dry.     Capillary Refill: Capillary refill takes less than 2 seconds.     Nails: There is no clubbing.  Neurological:     General: No focal deficit present.     Mental Status: She is alert and oriented to person, place, and time.     GCS: GCS eye subscore is 4. GCS verbal subscore is 5. GCS motor subscore is 6.     Sensory: Sensation is intact. No sensory deficit.     Motor: Motor function is intact. No weakness.     Coordination: Coordination is intact.     Gait: Gait is intact.     Deep Tendon Reflexes: Reflexes are normal and symmetric.  Psychiatric:        Attention and Perception: Attention normal.        Mood and Affect: Mood normal.        Speech: Speech normal.        Behavior: Behavior is cooperative.        Cognition and Memory: Cognition and memory normal.     Results for orders placed or performed in visit on 11/01/21  Lipid panel  Result Value Ref Range   Cholesterol, Total 120 100 - 199 mg/dL   Triglycerides 696 (H) 0 - 149 mg/dL   HDL 28 (L) >29 mg/dL   VLDL Cholesterol Cal 27 5 - 40 mg/dL   LDL Chol Calc (NIH) 65 0 - 99 mg/dL   Chol/HDL Ratio 4.3 0.0 - 4.4 ratio  Comprehensive metabolic panel  Result Value Ref Range   Glucose 279 (H) 70 - 99 mg/dL   BUN 13 6 - 24 mg/dL   Creatinine, Ser 5.28 0.57 - 1.00 mg/dL   eGFR 83 >41 LK/GMW/1.02   BUN/Creatinine Ratio 15 9 - 23   Sodium 146 (H) 134 - 144 mmol/L   Potassium 4.8 3.5 - 5.2 mmol/L   Chloride 106 96 - 106 mmol/L   CO2 17 (L) 20 - 29 mmol/L   Calcium 10.0 8.7 - 10.2 mg/dL   Total Protein 6.6 6.0 - 8.5 g/dL   Albumin  4.2 3.8 - 4.8 g/dL   Globulin, Total 2.4 1.5 - 4.5 g/dL   Albumin/Globulin Ratio 1.8 1.2 - 2.2   Bilirubin Total 0.2 0.0 - 1.2 mg/dL   Alkaline Phosphatase 127 (H) 44 - 121 IU/L   AST 16 0 - 40 IU/L   ALT 18 0 - 32 IU/L  Hemoglobin A1c  Result Value Ref Range   Hgb A1c MFr Bld 9.6 (H) 4.8 - 5.6 %   Est. average glucose Bld gHb Est-mCnc 229 mg/dL  Hepatitis C antibody  Result Value Ref Range   Hep C Virus Ab Non Reactive Non Reactive         Assessment & Plan:   Problem List Items Addressed This Visit     Encounter for annual physical exam - Primary    CPE today.  Labs pending. Will make changes as necessary based on results.  Review of HM activities and recommendations discussed and provided on AVS Anticipatory guidance, diet, and exercise recommendations provided.  Medications, allergies, and hx reviewed and updated as necessary.  Plan to f/u with CPE in 1 year or sooner for acute/chronic health needs as directed.  Relevant Orders   Hemoglobin A1c   CBC with Differential/Platelet   Comprehensive metabolic panel   Lipid panel   Type 2 diabetes mellitus with diabetic neuropathy, with long-term current use of insulin    Diabetes control not clear at this time as Dexcom coverage has been an issue. Will restart dexcom with G7. Samples provided today.  Patient unable to get coverage with levemir. Will change to toujeo today. Samples provided. Start at 10u daily.  Unable to get coverage with Trulicity. Appears mounjaro is covered. Will send in for this today. Patient will need coupon.  Once stable on toujeo and mounjaro, plan to discontinue metformin due to side effects.  Labs pending        Relevant Medications   Continuous Glucose Sensor (DEXCOM G7 SENSOR) MISC   Continuous Glucose Receiver (DEXCOM G7 RECEIVER) DEVI   insulin glargine, 2 Unit Dial, (TOUJEO MAX SOLOSTAR) 300 UNIT/ML Solostar Pen   Elevated blood pressure reading in office with diagnosis of  hypertension    Slight elevation in BP today. Will continue to monitor closely. Consider starting low dose ACE or ARB in the setting of DM.       Hyperlipidemia associated with type 2 diabetes mellitus    Labs pending. Continue atorvastatin.       Relevant Medications   Continuous Glucose Sensor (DEXCOM G7 SENSOR) MISC   Continuous Glucose Receiver (DEXCOM G7 RECEIVER) DEVI   insulin glargine, 2 Unit Dial, (TOUJEO MAX SOLOSTAR) 300 UNIT/ML Solostar Pen   Other Relevant Orders   Hemoglobin A1c   CBC with Differential/Platelet   Comprehensive metabolic panel   Lipid panel   Attention deficit hyperactivity disorder (ADHD), predominantly inattentive type   Relevant Orders   Hemoglobin A1c   CBC with Differential/Platelet   Comprehensive metabolic panel   Lipid panel   Class 3 severe obesity with serious comorbidity and body mass index (BMI) of 45.0 to 49.9 in adult   Relevant Medications   insulin glargine, 2 Unit Dial, (TOUJEO MAX SOLOSTAR) 300 UNIT/ML Solostar Pen   Other Relevant Orders   Hemoglobin A1c   CBC with Differential/Platelet   Comprehensive metabolic panel   Lipid panel   OSA (obstructive sleep apnea)   Relevant Orders   Hemoglobin A1c   CBC with Differential/Platelet   Comprehensive metabolic panel   Lipid panel   ASCVD (arteriosclerotic cardiovascular disease)   Relevant Medications   Continuous Glucose Sensor (DEXCOM G7 SENSOR) MISC   Continuous Glucose Receiver (DEXCOM G7 RECEIVER) DEVI   insulin glargine, 2 Unit Dial, (TOUJEO MAX SOLOSTAR) 300 UNIT/ML Solostar Pen   Other Relevant Orders   Hemoglobin A1c   CBC with Differential/Platelet   Comprehensive metabolic panel   Lipid panel   Other Visit Diagnoses     Type 2 diabetes mellitus with diabetic polyneuropathy, without long-term current use of insulin       Relevant Medications   Continuous Glucose Sensor (DEXCOM G7 SENSOR) MISC   Continuous Glucose Receiver (DEXCOM G7 RECEIVER) DEVI   insulin  glargine, 2 Unit Dial, (TOUJEO MAX SOLOSTAR) 300 UNIT/ML Solostar Pen   Other Relevant Orders   Hemoglobin A1c   CBC with Differential/Platelet   Comprehensive metabolic panel   Lipid panel   Health care maintenance       Relevant Orders   Hemoglobin A1c   CBC with Differential/Platelet   Comprehensive metabolic panel   Lipid panel   Stress incontinence in female       Relevant Orders   Ambulatory  referral to Physical Therapy          Follow up plan: Return in about 6 months (around 05/07/2023) for Med Management.  NEXT PREVENTATIVE PHYSICAL DUE IN 1 YEAR.  PATIENT COUNSELING PROVIDED FOR ALL ADULT PATIENTS: A well balanced diet low in saturated fats, cholesterol, and moderation in carbohydrates.  This can be as simple as monitoring portion sizes and cutting back on sugary beverages such as soda and juice to start with.    Daily water consumption of at least 64 ounces.  Physical activity at least 180 minutes per week.  If just starting out, start 10 minutes a day and work your way up.   This can be as simple as taking the stairs instead of the elevator and walking 2-3 laps around the office  purposefully every day.   STD protection, partner selection, and regular testing if high risk.  Limited consumption of alcoholic beverages if alcohol is consumed. For men, I recommend no more than 14 alcoholic beverages per week, spread out throughout the week (max 2 per day). Avoid "binge" drinking or consuming large quantities of alcohol in one setting.  Please let me know if you feel you may need help with reduction or quitting alcohol consumption.   Avoidance of nicotine, if used. Please let me know if you feel you may need help with reduction or quitting nicotine use.   Daily mental health attention. This can be in the form of 5 minute daily meditation, prayer, journaling, yoga, reflection, etc.  Purposeful attention to your emotions and mental state can significantly improve  your overall wellbeing  and  Health.  Please know that I am here to help you with all of your health care goals and am happy to work with you to find a solution that works best for you.  The greatest advice I have received with any changes in life are to take it one step at a time, that even means if all you can focus on is the next 60 seconds, then do that and celebrate your victories.  With any changes in life, you will have set backs, and that is OK. The important thing to remember is, if you have a set back, it is not a failure, it is an opportunity to try again! Screening Testing Mammogram Every 1 -2 years based on history and risk factors Starting at age 81 Pap Smear Ages 21-39 every 3 years Ages 56-65 every 5 years with HPV testing More frequent testing may be required based on results and history Colon Cancer Screening Every 1-10 years based on test performed, risk factors, and history Starting at age 48 Bone Density Screening Every 2-10 years based on history Starting at age 53 for women Recommendations for men differ based on medication usage, history, and risk factors AAA Screening One time ultrasound Men 21-62 years old who have every smoked Lung Cancer Screening Low Dose Lung CT every 12 months Age 18-80 years with a 30 pack-year smoking history who still smoke or who have quit within the last 15 years   Screening Labs Routine  Labs: Complete Blood Count (CBC), Complete Metabolic Panel (CMP), Cholesterol (Lipid Panel) Every 6-12 months based on history and medications May be recommended more frequently based on current conditions or previous results Hemoglobin A1c Lab Every 3-12 months based on history and previous results Starting at age 63 or earlier with diagnosis of diabetes, high cholesterol, BMI >26, and/or risk factors Frequent monitoring for patients with diabetes to ensure  blood sugar control Thyroid Panel (TSH) Every 6 months based on history, symptoms, and  risk factors May be repeated more often if on medication HIV One time testing for all patients 64 and older May be repeated more frequently for patients with increased risk factors or exposure Hepatitis C One time testing for all patients 3 and older May be repeated more frequently for patients with increased risk factors or exposure Gonorrhea, Chlamydia Every 12 months for all sexually active persons 13-24 years Additional monitoring may be recommended for those who are considered high risk or who have symptoms Every 12 months for any woman on birth control, regardless of sexual activity PSA Men 75-62 years old with risk factors Additional screening may be recommended from age 28-69 based on risk factors, symptoms, and history  Vaccine Recommendations Tetanus Booster All adults every 10 years Flu Vaccine All patients 6 months and older every year COVID Vaccine All patients 12 years and older Initial dosing with booster May recommend additional booster based on age and health history HPV Vaccine 2 doses all patients age 4-26 Dosing may be considered for patients over 26 Shingles Vaccine (Shingrix) 2 doses all adults 55 years and older Pneumonia (Pneumovax 23) All adults 65 years and older May recommend earlier dosing based on health history One year apart from Prevnar 13 Pneumonia (Prevnar 26) All adults 65 years and older Dosed 1 year after Pneumovax 23 Pneumonia (Prevnar 20) One time alternative to the two dosing of 13 and 23 For all adults with initial dose of 23, 20 is recommended 1 year later For all adults with initial dose of 13, 23 is still recommended as second option 1 year later

## 2022-11-05 NOTE — Assessment & Plan Note (Signed)
CPE today. Labs pending. Will make changes as necessary based on results.  Review of HM activities and recommendations discussed and provided on AVS Anticipatory guidance, diet, and exercise recommendations provided.  Medications, allergies, and hx reviewed and updated as necessary.  Plan to f/u with CPE in 1 year or sooner for acute/chronic health needs as directed.   

## 2022-11-05 NOTE — Patient Instructions (Signed)
Please have the eye doctor faxed to the office so we can update the health maintenance. [phone].  Let me know if your sugars are running high on the 10 units of the new insulin and we can make changes as needed.   I will let you know what your labs show. I suspect your sugar will be higher than normal since you have been off of the insulin, but we will work through this.   For all adult patients, I recommend A well balanced diet low in saturated fats, cholesterol, and moderation in carbohydrates.   This can be as simple as monitoring portion sizes and cutting back on sugary beverages such as soda and juice to start with.    Daily water consumption of at least 64 ounces.  Physical activity at least 180 minutes per week, if just starting out.   This can be as simple as taking the stairs instead of the elevator and walking 2-3 laps around the office  purposefully every day.   STD protection, partner selection, and regular testing if high risk.  Limited consumption of alcoholic beverages if alcohol is consumed.  For women, I recommend no more than 7 alcoholic beverages per week, spread out throughout the week.  Avoid "binge" drinking or consuming large quantities of alcohol in one setting.   Please let me know if you feel you may need help with reduction or quitting alcohol consumption.   Avoidance of nicotine, if used.  Please let me know if you feel you may need help with reduction or quitting nicotine use.   Daily mental health attention.  This can be in the form of 5 minute daily meditation, prayer, journaling, yoga, reflection, etc.   Purposeful attention to your emotions and mental state can significantly improve your overall wellbeing  and  Health.  Please know that I am here to help you with all of your health care goals and am happy to work with you to find a solution that works best for you.  The greatest advice I have received with any changes in life are to take it one  step at a time, that even means if all you can focus on is the next 60 seconds, then do that and celebrate your victories.  With any changes in life, you will have set backs, and that is OK. The important thing to remember is, if you have a set back, it is not a failure, it is an opportunity to try again!  Health Maintenance Recommendations Screening Testing Mammogram Every 1 -2 years based on history and risk factors Starting at age 20 Pap Smear Ages 21-39 every 3 years Ages 59-65 every 5 years with HPV testing More frequent testing may be required based on results and history Colon Cancer Screening Every 1-10 years based on test performed, risk factors, and history Starting at age 20 Bone Density Screening Every 2-10 years based on history Starting at age 47 for women Recommendations for men differ based on medication usage, history, and risk factors AAA Screening One time ultrasound Men 63-103 years old who have every smoked Lung Cancer Screening Low Dose Lung CT every 12 months Age 47-80 years with a 30 pack-year smoking history who still smoke or who have quit within the last 15 years  Screening Labs Routine  Labs: Complete Blood Count (CBC), Complete Metabolic Panel (CMP), Cholesterol (Lipid Panel) Every 6-12 months based on history and medications May be recommended more frequently based on current conditions or previous results  Hemoglobin A1c Lab Every 3-12 months based on history and previous results Starting at age 67 or earlier with diagnosis of diabetes, high cholesterol, BMI >26, and/or risk factors Frequent monitoring for patients with diabetes to ensure blood sugar control Thyroid Panel (TSH w/ T3 & T4) Every 6 months based on history, symptoms, and risk factors May be repeated more often if on medication HIV One time testing for all patients 65 and older May be repeated more frequently for patients with increased risk factors or exposure Hepatitis C One time  testing for all patients 15 and older May be repeated more frequently for patients with increased risk factors or exposure Gonorrhea, Chlamydia Every 12 months for all sexually active persons 13-24 years Additional monitoring may be recommended for those who are considered high risk or who have symptoms PSA Men 41-19 years old with risk factors Additional screening may be recommended from age 69-69 based on risk factors, symptoms, and history  Vaccine Recommendations Tetanus Booster All adults every 10 years Flu Vaccine All patients 6 months and older every year COVID Vaccine All patients 12 years and older Initial dosing with booster May recommend additional booster based on age and health history HPV Vaccine 2 doses all patients age 67-26 Dosing may be considered for patients over 26 Shingles Vaccine (Shingrix) 2 doses all adults 55 years and older Pneumonia (Pneumovax 23) All adults 65 years and older May recommend earlier dosing based on health history Pneumonia (Prevnar 20) All adults 65 years and older Dosed 1 year after Pneumovax 23  Additional Screening, Testing, and Vaccinations may be recommended on an individualized basis based on family history, health history, risk factors, and/or exposure.

## 2022-11-05 NOTE — Assessment & Plan Note (Signed)
Diabetes control not clear at this time as Dexcom coverage has been an issue. Will restart dexcom with G7. Samples provided today.  Patient unable to get coverage with levemir. Will change to toujeo today. Samples provided. Start at 10u daily.  Unable to get coverage with Trulicity. Appears mounjaro is covered. Will send in for this today. Patient will need coupon.  Once stable on toujeo and mounjaro, plan to discontinue metformin due to side effects.  Labs pending

## 2022-11-05 NOTE — Assessment & Plan Note (Signed)
Slight elevation in BP today. Will continue to monitor closely. Consider starting low dose ACE or ARB in the setting of DM.

## 2022-11-17 ENCOUNTER — Telehealth: Payer: Self-pay

## 2022-11-17 NOTE — Telephone Encounter (Signed)
I completed a PA for the pts. Dexcom 7 and it was denied on CoverMyMeds by the pts. Ins. Pt. Has to have tried and failed [employer], pt. Has to check their blood sugar 3 times daily and be on three different insulins.

## 2022-12-05 ENCOUNTER — Encounter: Payer: Self-pay | Admitting: Nurse Practitioner

## 2022-12-05 DIAGNOSIS — E1140 Type 2 diabetes mellitus with diabetic neuropathy, unspecified: Secondary | ICD-10-CM

## 2022-12-05 DIAGNOSIS — E1169 Type 2 diabetes mellitus with other specified complication: Secondary | ICD-10-CM

## 2022-12-11 MED ORDER — FREESTYLE LIBRE 3 SENSOR MISC: 3 refills | Status: AC

## 2022-12-11 MED ORDER — TIRZEPATIDE 5 MG/0.5ML ~~LOC~~ SOAJ: 5.0000 mg | SUBCUTANEOUS | 0 refills | Status: DC

## 2022-12-11 MED ORDER — TIRZEPATIDE 7.5 MG/0.5ML ~~LOC~~ SOAJ: 7.5000 mg | SUBCUTANEOUS | 0 refills | Status: DC

## 2022-12-11 MED ORDER — TIRZEPATIDE 2.5 MG/0.5ML ~~LOC~~ SOAJ: 2.5000 mg | SUBCUTANEOUS | 0 refills | Status: DC

## 2022-12-12 MED ORDER — TIRZEPATIDE 5 MG/0.5ML ~~LOC~~ SOAJ: 5.0000 mg | SUBCUTANEOUS | 0 refills | Status: AC

## 2022-12-12 MED ORDER — TIRZEPATIDE 7.5 MG/0.5ML ~~LOC~~ SOAJ: 7.5000 mg | SUBCUTANEOUS | 0 refills | Status: AC

## 2022-12-12 MED ORDER — TIRZEPATIDE 2.5 MG/0.5ML ~~LOC~~ SOAJ: 2.5000 mg | SUBCUTANEOUS | 0 refills | Status: AC

## 2022-12-12 NOTE — Addendum Note (Signed)
Addended by: Presleigh Feldstein, [place] Dec E on: 12/12/2022 07:30 AM   Modules accepted: Orders

## 2023-01-09 ENCOUNTER — Telehealth: Payer: Self-pay | Admitting: Internal Medicine

## 2023-01-09 NOTE — Telephone Encounter (Signed)
P.A. Greggory Keen 5mg  through covermymeds. Waiting on response

## 2023-02-05 NOTE — Telephone Encounter (Signed)
P.A. Greggory Keen approved til 01/11/24

## 2023-02-22 ENCOUNTER — Telehealth: Payer: Self-pay | Admitting: Nurse Practitioner

## 2023-02-22 NOTE — Telephone Encounter (Signed)
P.A. Greggory Keen received, not completed no active coverage

## 2023-03-20 ENCOUNTER — Telehealth: Payer: Self-pay

## 2023-03-20 NOTE — Telephone Encounter (Signed)
Left pt a voicemail to check if she has updated insurance for PA. Need a current insurance card.

## 2023-03-30 ENCOUNTER — Telehealth: Payer: Self-pay

## 2023-03-30 NOTE — Telephone Encounter (Signed)
Pt has moved to [facility] and will no longer be using [facility] as her PCP. Future appts have been cancelled.

## 2023-03-30 NOTE — Telephone Encounter (Signed)
Pt states she has moved to [place], [place] and no longer needs [place] to handle this PA.

## 2023-05-07 ENCOUNTER — Encounter: Payer: 59 | Admitting: Nurse Practitioner

## 2023-06-03 ENCOUNTER — Other Ambulatory Visit: Payer: Self-pay | Admitting: Nurse Practitioner
